# Patient Record
Sex: Male | Born: 1960 | Race: White | Hispanic: No | Marital: Married | Smoking: Current every day smoker
Health system: Southern US, Community
[De-identification: ages and names within clinical notes are randomized; demographics above are authoritative.]

## PROBLEM LIST (undated history)

## (undated) DIAGNOSIS — I499 Cardiac arrhythmia, unspecified: Secondary | ICD-10-CM

## (undated) DIAGNOSIS — M179 Osteoarthritis of knee, unspecified: Secondary | ICD-10-CM

## (undated) DIAGNOSIS — I4819 Other persistent atrial fibrillation: Secondary | ICD-10-CM

## (undated) DIAGNOSIS — I739 Peripheral vascular disease, unspecified: Secondary | ICD-10-CM

## (undated) DIAGNOSIS — Z7901 Long term (current) use of anticoagulants: Secondary | ICD-10-CM

## (undated) DIAGNOSIS — E785 Hyperlipidemia, unspecified: Secondary | ICD-10-CM

## (undated) DIAGNOSIS — I4891 Unspecified atrial fibrillation: Secondary | ICD-10-CM

## (undated) DIAGNOSIS — K562 Volvulus: Secondary | ICD-10-CM

## (undated) DIAGNOSIS — K645 Perianal venous thrombosis: Secondary | ICD-10-CM

## (undated) DIAGNOSIS — I70219 Atherosclerosis of native arteries of extremities with intermittent claudication, unspecified extremity: Secondary | ICD-10-CM

## (undated) DIAGNOSIS — E871 Hypo-osmolality and hyponatremia: Secondary | ICD-10-CM

## (undated) DIAGNOSIS — D171 Benign lipomatous neoplasm of skin and subcutaneous tissue of trunk: Secondary | ICD-10-CM

## (undated) DIAGNOSIS — I8393 Asymptomatic varicose veins of bilateral lower extremities: Secondary | ICD-10-CM

## (undated) DIAGNOSIS — F1010 Alcohol abuse, uncomplicated: Secondary | ICD-10-CM

## (undated) DIAGNOSIS — I5189 Other ill-defined heart diseases: Secondary | ICD-10-CM

## (undated) DIAGNOSIS — Z720 Tobacco use: Secondary | ICD-10-CM

## (undated) DIAGNOSIS — K648 Other hemorrhoids: Secondary | ICD-10-CM

## (undated) DIAGNOSIS — I700 Atherosclerosis of aorta: Secondary | ICD-10-CM

## (undated) DIAGNOSIS — M1990 Unspecified osteoarthritis, unspecified site: Secondary | ICD-10-CM

## (undated) DIAGNOSIS — I10 Essential (primary) hypertension: Secondary | ICD-10-CM

## (undated) HISTORY — DX: Asymptomatic varicose veins of bilateral lower extremities: I83.93

## (undated) HISTORY — DX: Essential (primary) hypertension: I10

## (undated) HISTORY — PX: JOINT REPLACEMENT: SHX530

---

## 2001-05-10 HISTORY — PX: BACK SURGERY: SHX140

## 2001-08-16 ENCOUNTER — Encounter: Payer: Self-pay | Admitting: Neurosurgery

## 2001-08-18 ENCOUNTER — Ambulatory Visit (HOSPITAL_COMMUNITY): Admission: RE | Admit: 2001-08-18 | Discharge: 2001-08-18 | Payer: Self-pay | Admitting: Neurosurgery

## 2001-08-18 ENCOUNTER — Encounter: Payer: Self-pay | Admitting: Neurosurgery

## 2006-02-04 DIAGNOSIS — I10 Essential (primary) hypertension: Secondary | ICD-10-CM | POA: Insufficient documentation

## 2007-05-16 DIAGNOSIS — F409 Phobic anxiety disorder, unspecified: Secondary | ICD-10-CM | POA: Insufficient documentation

## 2009-09-11 DIAGNOSIS — R6882 Decreased libido: Secondary | ICD-10-CM | POA: Insufficient documentation

## 2009-09-11 DIAGNOSIS — R5381 Other malaise: Secondary | ICD-10-CM | POA: Insufficient documentation

## 2009-10-14 ENCOUNTER — Ambulatory Visit: Payer: Self-pay | Admitting: Family Medicine

## 2009-10-14 DIAGNOSIS — E871 Hypo-osmolality and hyponatremia: Secondary | ICD-10-CM | POA: Insufficient documentation

## 2009-10-14 DIAGNOSIS — E559 Vitamin D deficiency, unspecified: Secondary | ICD-10-CM | POA: Insufficient documentation

## 2010-07-31 ENCOUNTER — Ambulatory Visit: Payer: Self-pay | Admitting: Specialist

## 2010-08-11 ENCOUNTER — Ambulatory Visit: Payer: Self-pay | Admitting: Specialist

## 2010-08-18 ENCOUNTER — Ambulatory Visit: Payer: Self-pay | Admitting: Specialist

## 2014-04-25 ENCOUNTER — Ambulatory Visit: Payer: Self-pay | Admitting: Nephrology

## 2014-10-22 ENCOUNTER — Other Ambulatory Visit: Payer: Self-pay | Admitting: Family Medicine

## 2014-10-22 DIAGNOSIS — I10 Essential (primary) hypertension: Secondary | ICD-10-CM

## 2014-11-01 DIAGNOSIS — M179 Osteoarthritis of knee, unspecified: Secondary | ICD-10-CM | POA: Insufficient documentation

## 2014-11-01 DIAGNOSIS — M1710 Unilateral primary osteoarthritis, unspecified knee: Secondary | ICD-10-CM | POA: Insufficient documentation

## 2014-11-01 DIAGNOSIS — I83813 Varicose veins of bilateral lower extremities with pain: Secondary | ICD-10-CM | POA: Insufficient documentation

## 2014-11-01 DIAGNOSIS — N529 Male erectile dysfunction, unspecified: Secondary | ICD-10-CM | POA: Insufficient documentation

## 2014-11-04 ENCOUNTER — Encounter: Payer: Self-pay | Admitting: Family Medicine

## 2014-11-04 ENCOUNTER — Ambulatory Visit (INDEPENDENT_AMBULATORY_CARE_PROVIDER_SITE_OTHER): Payer: BLUE CROSS/BLUE SHIELD | Admitting: Family Medicine

## 2014-11-04 VITALS — BP 138/82 | HR 80 | Temp 97.6°F | Resp 16 | Wt 190.8 lb

## 2014-11-04 DIAGNOSIS — K645 Perianal venous thrombosis: Secondary | ICD-10-CM | POA: Diagnosis not present

## 2014-11-04 MED ORDER — HYDROCORTISONE ACETATE 25 MG RE SUPP: 25.0000 mg | Freq: Two times a day (BID) | RECTAL | Status: DC

## 2014-11-04 MED ORDER — HYDROCODONE-ACETAMINOPHEN 5-325 MG PO TABS: 1.0000 | ORAL_TABLET | Freq: Four times a day (QID) | ORAL | Status: DC | PRN

## 2014-11-04 NOTE — Progress Notes (Signed)
Subjective:    Patient ID: Henry Hill, male    DOB: [birth year], 54 y.o.   MRN: [id number]  HPI  This 54 year old male was placed on Nifedipine a month or more ago by his nephrologist due to hyponatremia from use of HCTZ. Was advised this could create a constipation issue but did not take a stool softener regularly. Hemorrhoids became swollen and very painful 3 days ago. Feels it occurred due to constipation. Has been trying Preparation-H and hot Epsom saltwater soak with minimal relief. Occasional bleeding.   Past Medical History  Diagnosis Date  . Essential hypertension   . Varicose veins of both lower extremities     Past Surgical History  Procedure Laterality Date  . Back surgery  2003   History  Substance Use Topics  . Smoking status: Current Every Day Smoker -- 0.75 packs/day    Types: Cigarettes  . Smokeless tobacco: Not on file  . Alcohol Use: 0.0 oz/week    0 Standard drinks or equivalent per week     Comment: MODERATE USE   Family History  Problem Relation Age of Onset  . Heart attack Mother   . Diabetes Father   . Hypertension Father    Current Outpatient Prescriptions  Medication Sig Dispense Refill  . benazepril (LOTENSIN) 40 MG tablet TAKE 1 TABLET EVERY DAY 30 tablet 6  . cholecalciferol (VITAMIN D) 1000 UNITS tablet Take 1 tablet by mouth daily.    . meloxicam (MOBIC) 15 MG tablet Take 1 tablet by mouth daily.    Marland Kitchen NIFEdipine (PROCARDIA XL/ADALAT-CC) 60 MG 24 hr tablet   2   No current facility-administered medications for this visit.   No Known Allergies  Review of Systems  Constitutional: Negative.   Respiratory: Negative.   Cardiovascular: Negative.   Gastrointestinal: Positive for constipation, anal bleeding and rectal pain. Negative for nausea, vomiting and abdominal pain.      BP 138/82 mmHg  Pulse 80  Temp(Src) 97.6 F (36.4 C) (Oral)  Resp 16  Wt 190 lb 12.8 oz (86.546 kg)  Objective:   Physical Exam  Constitutional: He is oriented  to person, place, and time. He appears well-developed and well-nourished. No distress.  HENT:  Head: Normocephalic and atraumatic.  Right Ear: Hearing normal.  Left Ear: Hearing normal.  Nose: Nose normal.  Eyes: Conjunctivae and lids are normal. Right eye exhibits no discharge. Left eye exhibits no discharge. No scleral icterus.  Pulmonary/Chest: Effort normal. No respiratory distress.  Genitourinary: Rectal exam shows external hemorrhoid.     Musculoskeletal: Normal range of motion.  Neurological: He is alert and oriented to person, place, and time.  Skin: Skin is intact. No lesion and no rash noted.  Psychiatric: He has a normal mood and affect. His speech is normal and behavior is normal. Thought content normal.      Assessment & Plan:  1. Thrombosed external hemorrhoids Onset 3 days ago. Very large cluster around anus. Anesthetized with 1% Xylocaine with Epinephrine 1.5 cc and lanced in three large external hemorrhoids. Small clots expressed. Given analgesic and suppositories. He will soak twice a day and get back on stool softener daily. Increase fluid intake and out of work until follow up in 3-4 days. May need surgical referral pending progress. - HYDROcodone-acetaminophen (NORCO/VICODIN) 5-325 MG per tablet; Take 1 tablet by mouth every 6 (six) hours as needed for moderate pain.  Dispense: 30 tablet; Refill: 0 - hydrocortisone (ANUSOL-HC) 25 MG suppository; Place 1 suppository (25  mg total) rectally 2 (two) times daily.  Dispense: 12 suppository; Refill: 0

## 2014-11-04 NOTE — Patient Instructions (Signed)

## 2014-11-07 ENCOUNTER — Encounter: Payer: Self-pay | Admitting: Family Medicine

## 2014-11-07 ENCOUNTER — Ambulatory Visit (INDEPENDENT_AMBULATORY_CARE_PROVIDER_SITE_OTHER): Payer: BLUE CROSS/BLUE SHIELD | Admitting: Family Medicine

## 2014-11-07 VITALS — BP 126/84 | HR 76 | Temp 97.9°F | Resp 16 | Wt 192.6 lb

## 2014-11-07 DIAGNOSIS — K645 Perianal venous thrombosis: Secondary | ICD-10-CM

## 2014-11-07 NOTE — Progress Notes (Signed)
Subjective:    Patient ID: Henry Hill, male    DOB: January 16, 1961, 54 y.o.   MRN: [id number]  HPI Follow up of large thrombosed external hemorrhoids still very painful without improvement from lancing and Hydrocortisone suppositories with hot soaks 3 days ago. Large area on the left is very raw but no significant bleeding now. Still taking the stool softeners.  Patient Active Problem List   Diagnosis Date Noted  . Arthropathy, lower leg 11/01/2014  . Failure of erection 11/01/2014  . 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 Hypo-osmolality and hyponatremia 10/14/2009  . Avitaminosis D 10/14/2009  . Decreased libido 09/11/2009  . Malaise and fatigue 09/11/2009  . Phobia 05/16/2007  . Essential (primary) hypertension 02/04/2006   History  Substance Use Topics  . Smoking status: Current Every Day Smoker -- 0.75 packs/day    Types: Cigarettes  . Smokeless tobacco: Not on file  . Alcohol Use: 0.0 oz/week    0 Standard drinks or equivalent per week     Comment: MODERATE USE   Past Surgical History  Procedure Laterality Date  . Back surgery  2003   Family History  Problem Relation Age of Onset  . Heart attack Mother   . Diabetes Father   . Hypertension Father    Current Outpatient Prescriptions on File Prior to Visit  Medication Sig Dispense Refill  . benazepril (LOTENSIN) 40 MG tablet TAKE 1 TABLET EVERY DAY 30 tablet 6  . cholecalciferol (VITAMIN D) 1000 UNITS tablet Take 1 tablet by mouth daily.    Marland Kitchen HYDROcodone-acetaminophen (NORCO/VICODIN) 5-325 MG per tablet Take 1 tablet by mouth every 6 (six) hours as needed for moderate pain. 30 tablet 0  . hydrocortisone (ANUSOL-HC) 25 MG suppository Place 1 suppository (25 mg total) rectally 2 (two) times daily. 12 suppository 0  . meloxicam (MOBIC) 15 MG tablet Take 1 tablet by mouth daily.    Marland Kitchen NIFEdipine (PROCARDIA XL/ADALAT-CC) 60 MG 24 hr tablet   2   No current facility-administered medications on file prior to visit.   No Known  Allergies   Review of Systems  Constitutional: Negative.   Respiratory: Negative.   Cardiovascular: Negative.   Gastrointestinal: Positive for rectal pain.      BP 126/84 mmHg  Pulse 76  Temp(Src) 97.9 F (36.6 C) (Oral)  Resp 16  Wt 192 lb 9.6 oz (87.363 kg)  Objective:   Physical Exam  Constitutional: He is oriented to person, place, and time. He appears well-developed and well-nourished. No distress.  HENT:  Head: Normocephalic and atraumatic.  Right Ear: Hearing normal.  Left Ear: Hearing normal.  Nose: Nose normal.  Eyes: Conjunctivae and lids are normal. Right eye exhibits no discharge. Left eye exhibits no discharge. No scleral icterus.  Pulmonary/Chest: Effort normal. No respiratory distress.  Abdominal: Soft.  Genitourinary: Rectal exam shows external hemorrhoid.     Musculoskeletal: Normal range of motion.  Neurological: He is alert and oriented to person, place, and time.  Skin: Skin is intact. No lesion and no rash noted.  Psychiatric: He has a normal mood and affect. His speech is normal and behavior is normal. Thought content normal.      BP 126/84 mmHg  Pulse 76  Temp(Src) 97.9 F (36.6 C) (Oral)  Resp 16  Wt 192 lb 9.6 oz (87.363 kg)  Assessment & Plan:  1. Thrombosed external hemorrhoids Still very painful with very little drainage despite lancing 3 days ago. Recommend surgical evaluation today or tomorrow. - Ambulatory referral to  General Surgery

## 2014-11-08 ENCOUNTER — Ambulatory Visit: Payer: BLUE CROSS/BLUE SHIELD | Admitting: Family Medicine

## 2014-11-08 ENCOUNTER — Ambulatory Visit (INDEPENDENT_AMBULATORY_CARE_PROVIDER_SITE_OTHER): Payer: BLUE CROSS/BLUE SHIELD | Admitting: Surgery

## 2014-11-08 ENCOUNTER — Encounter: Payer: Self-pay | Admitting: Surgery

## 2014-11-08 VITALS — BP 128/90 | HR 92 | Temp 97.8°F | Ht 72.0 in | Wt 191.0 lb

## 2014-11-08 DIAGNOSIS — K6289 Other specified diseases of anus and rectum: Secondary | ICD-10-CM | POA: Insufficient documentation

## 2014-11-08 DIAGNOSIS — K648 Other hemorrhoids: Secondary | ICD-10-CM | POA: Diagnosis not present

## 2014-11-08 MED ORDER — PRAMOXINE HCL 1 % RE FOAM: 1.0000 "application " | RECTAL | Status: DC | PRN

## 2014-11-08 MED ORDER — LIDOCAINE HCL 4 % EX CREA
1.0000 | TOPICAL_CREAM | Freq: Three times a day (TID) | CUTANEOUS | Status: DC | PRN
Start: 2014-11-08 — End: 2014-11-08

## 2014-11-08 MED ORDER — HYDROCORTISONE 2.5 % RE CREA: 1.0000 "application " | TOPICAL_CREAM | Freq: Two times a day (BID) | RECTAL | Status: DC

## 2014-11-08 MED ORDER — OXYCODONE-ACETAMINOPHEN 5-325 MG PO TABS: 1.0000 | ORAL_TABLET | ORAL | Status: DC | PRN

## 2014-11-08 MED ORDER — LIDOCAINE HCL 4 % EX CREA: 1.0000 "application " | TOPICAL_CREAM | Freq: Three times a day (TID) | CUTANEOUS | Status: DC | PRN

## 2014-11-08 MED ORDER — HYDROCORTISONE ACETATE 25 MG RE SUPP: 25.0000 mg | Freq: Two times a day (BID) | RECTAL | Status: DC

## 2014-11-08 NOTE — Progress Notes (Signed)
CC: Severe anal pain x 1 week  54 yo M with history of reducible hemorrhoids who presents with 1 week of anal pain.  Began acutely.  Went to PCP who dx with thrombosed hemorrhoids who lanced them.  Min improvement.  Better with stool softener.  No fevers/chills, night sweats, shortness of breath, chest pain, nausea/vomiting, diarrhea/constipation, dysuria/hematuria.  Active Ambulatory Problems    Diagnosis Date Noted  . Arthropathy, lower leg 11/01/2014  . Decreased libido 09/11/2009  . Failure of erection 11/01/2014  . Essential (primary) hypertension 02/04/2006  . Hypo-osmolality and hyponatremia 10/14/2009  . Malaise and fatigue 09/11/2009  . Phobia 05/16/2007  . Avitaminosis D 10/14/2009  . 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 Anal pain 11/08/2014   Resolved Ambulatory Problems    Diagnosis Date Noted  . No Resolved Ambulatory Problems   Past Medical History  Diagnosis Date  . Essential hypertension   . Varicose veins of both lower extremities    Meds: Anusol suppositories, colace  No Known Allergies   History   Social History  . Marital Status: Married    Spouse Name: N/A  . Number of Children: N/A  . Years of Education: N/A   Occupational History  . Not on file.   Social History Main Topics  . Smoking status: Current Every Day Smoker -- 0.75 packs/day    Types: Cigarettes  . Smokeless tobacco: Not on file  . Alcohol Use: 0.0 oz/week    0 Standard drinks or equivalent per week     Comment: MODERATE USE  . Drug Use: No  . Sexual Activity: Not on file   Other Topics Concern  . Not on file   Social History Narrative   Family History  Problem Relation Age of Onset  . Heart attack Mother   . Diabetes Father   . Hypertension Father    ROS: Full ROS obtained, pertinent positives and negatives as above  Blood pressure 128/90, pulse 92, temperature 97.8 F (36.6 C), temperature source Oral, height 6' (1.829 m), weight 191 lb (86.637 kg).  GEN: NAD/A&Ox3 FACE: no  obvious facial trauma, normal external nose, normal external ears EYES: no scleral icterus, no conjunctivitis HEAD: normocephalic atraumatic CV: RRR, no MRG RESP: moving air well, lungs clear ABD: soft, nontender, nondistended ANUS: Prolapsed, nonreducible internal/external right hemorrhoids, with some epidermolysis EXT: moving all ext well, strength 5/5 NEURO: cnII-XII grossly intact, sensation intact all 4 ext  Labs: No new labs Imaging: no new imaging  A/P 54 yo M who presents with prolapsed, nonreducible incarcerated right sided internal/external hemorrhiods.  Do not feel that this will resolve on own and have recommended admission and hemorrhoidectomy.  He has many personal issues and would like to have them addressed next week.  I have given him anusol, proctofoam, topical lidocaine and percocet to attempt to get him through the weekend.  He is to call if his pain worsens, fevers, etc.  Will call on Tuesday to address with Dr. Pat Patrick possible plan for hemorroidectomy

## 2014-11-08 NOTE — Patient Instructions (Signed)
Our office will call you early morning on Tuesday. However, if you feel worse please go to the ED.

## 2014-11-13 ENCOUNTER — Ambulatory Visit (HOSPITAL_COMMUNITY): Payer: BLUE CROSS/BLUE SHIELD | Admitting: Anesthesiology

## 2014-11-13 ENCOUNTER — Encounter (HOSPITAL_COMMUNITY)
Admission: RE | Disposition: A | Discharge status: Home / Self Care | Payer: Self-pay | Source: Ambulatory Visit | Attending: General Surgery

## 2014-11-13 ENCOUNTER — Ambulatory Visit (HOSPITAL_COMMUNITY)
Admission: RE | Admit: 2014-11-13 | Discharge: 2014-11-13 | Disposition: A | Discharge status: Home / Self Care | Payer: BLUE CROSS/BLUE SHIELD | Source: Ambulatory Visit | Attending: General Surgery | Admitting: General Surgery

## 2014-11-13 ENCOUNTER — Encounter (HOSPITAL_COMMUNITY): Payer: Self-pay | Admitting: *Deleted

## 2014-11-13 DIAGNOSIS — Z79899 Other long term (current) drug therapy: Secondary | ICD-10-CM | POA: Insufficient documentation

## 2014-11-13 DIAGNOSIS — I10 Essential (primary) hypertension: Secondary | ICD-10-CM | POA: Diagnosis not present

## 2014-11-13 DIAGNOSIS — Z7952 Long term (current) use of systemic steroids: Secondary | ICD-10-CM | POA: Diagnosis not present

## 2014-11-13 DIAGNOSIS — F17200 Nicotine dependence, unspecified, uncomplicated: Secondary | ICD-10-CM | POA: Diagnosis not present

## 2014-11-13 DIAGNOSIS — K648 Other hemorrhoids: Secondary | ICD-10-CM

## 2014-11-13 DIAGNOSIS — K645 Perianal venous thrombosis: Secondary | ICD-10-CM

## 2014-11-13 DIAGNOSIS — Z791 Long term (current) use of non-steroidal anti-inflammatories (NSAID): Secondary | ICD-10-CM | POA: Diagnosis not present

## 2014-11-13 HISTORY — PX: HEMORRHOID SURGERY: SHX153

## 2014-11-13 LAB — CBC
HCT: 39.4 % (ref 39.0–52.0)
Hemoglobin: 13.5 g/dL (ref 13.0–17.0)
MCH: 33 pg (ref 26.0–34.0)
MCHC: 34.3 g/dL (ref 30.0–36.0)
MCV: 96.3 fL (ref 78.0–100.0)
Platelets: 325 10*3/uL (ref 150–400)
RBC: 4.09 MIL/uL — ABNORMAL LOW (ref 4.22–5.81)
RDW: 13.2 % (ref 11.5–15.5)
WBC: 7.6 10*3/uL (ref 4.0–10.5)

## 2014-11-13 LAB — BASIC METABOLIC PANEL
Anion gap: 8 (ref 5–15)
BUN: 8 mg/dL (ref 6–20)
CO2: 23 mmol/L (ref 22–32)
Calcium: 9.3 mg/dL (ref 8.9–10.3)
Chloride: 102 mmol/L (ref 101–111)
Creatinine, Ser: 0.74 mg/dL (ref 0.61–1.24)
GFR calc Af Amer: >60 mL/min (ref >60)
GFR calc non Af Amer: >60 mL/min (ref >60)
Glucose, Bld: 102 mg/dL — ABNORMAL HIGH (ref 65–99)
Potassium: 4.2 mmol/L (ref 3.5–5.1)
Sodium: 133 mmol/L — ABNORMAL LOW (ref 135–145)

## 2014-11-13 SURGERY — HEMORRHOIDECTOMY
Anesthesia: General

## 2014-11-13 MED ORDER — DIBUCAINE 1 % RE OINT
TOPICAL_OINTMENT | RECTAL | Status: AC
  Filled 2014-11-13: qty 28

## 2014-11-13 MED ORDER — FENTANYL CITRATE (PF) 250 MCG/5ML IJ SOLN
INTRAMUSCULAR | Status: AC
  Filled 2014-11-13: qty 5

## 2014-11-13 MED ORDER — LACTATED RINGERS IV SOLN
INTRAVENOUS | Status: DC
  Administered 2014-11-13: 1000 mL via INTRAVENOUS

## 2014-11-13 MED ORDER — DEXAMETHASONE SODIUM PHOSPHATE 10 MG/ML IJ SOLN
INTRAMUSCULAR | Status: AC
  Filled 2014-11-13: qty 1

## 2014-11-13 MED ORDER — ONDANSETRON HCL 4 MG/2ML IJ SOLN
INTRAMUSCULAR | Status: AC
  Filled 2014-11-13: qty 2

## 2014-11-13 MED ORDER — FENTANYL CITRATE (PF) 250 MCG/5ML IJ SOLN
INTRAMUSCULAR | Status: DC | PRN
  Administered 2014-11-13 (×3): 25 ug via INTRAVENOUS
  Administered 2014-11-13: 50 ug via INTRAVENOUS

## 2014-11-13 MED ORDER — FENTANYL CITRATE (PF) 100 MCG/2ML IJ SOLN
INTRAMUSCULAR | Status: AC
Start: 2014-11-13 — End: 2014-11-13
  Filled 2014-11-13: qty 2

## 2014-11-13 MED ORDER — SODIUM CHLORIDE 0.9 % IJ SOLN
INTRAMUSCULAR | Status: DC | PRN
  Administered 2014-11-13: 12.5 mL

## 2014-11-13 MED ORDER — ONDANSETRON HCL 4 MG/2ML IJ SOLN
INTRAMUSCULAR | Status: AC
Start: 2014-11-13 — End: 2014-11-13
  Filled 2014-11-13: qty 2

## 2014-11-13 MED ORDER — MIDAZOLAM HCL 2 MG/2ML IJ SOLN
INTRAMUSCULAR | Status: AC
  Filled 2014-11-13: qty 2

## 2014-11-13 MED ORDER — MIDAZOLAM HCL 2 MG/2ML IJ SOLN
INTRAMUSCULAR | Status: AC
Start: 2014-11-13 — End: 2014-11-13
  Filled 2014-11-13: qty 2

## 2014-11-13 MED ORDER — LIDOCAINE HCL (CARDIAC) 20 MG/ML IV SOLN
INTRAVENOUS | Status: AC
Start: 2014-11-13 — End: 2014-11-13
  Filled 2014-11-13: qty 5

## 2014-11-13 MED ORDER — ROCURONIUM BROMIDE 100 MG/10ML IV SOLN
INTRAVENOUS | Status: AC
  Filled 2014-11-13: qty 1

## 2014-11-13 MED ORDER — DEXAMETHASONE SODIUM PHOSPHATE 10 MG/ML IJ SOLN
INTRAMUSCULAR | Status: DC | PRN
  Administered 2014-11-13: 10 mg via INTRAVENOUS

## 2014-11-13 MED ORDER — CEFOXITIN SODIUM 2 G IV SOLR
2.0000 g | INTRAVENOUS | Status: AC
  Administered 2014-11-13: 2 g via INTRAVENOUS

## 2014-11-13 MED ORDER — HYDROCODONE-ACETAMINOPHEN 5-325 MG PO TABS: 1.0000 | ORAL_TABLET | Freq: Four times a day (QID) | ORAL | Status: DC | PRN

## 2014-11-13 MED ORDER — ONDANSETRON HCL 4 MG/2ML IJ SOLN
INTRAMUSCULAR | Status: DC | PRN
  Administered 2014-11-13: 4 mg via INTRAVENOUS

## 2014-11-13 MED ORDER — SODIUM CHLORIDE 0.9 % IJ SOLN
INTRAMUSCULAR | Status: AC
  Filled 2014-11-13: qty 20

## 2014-11-13 MED ORDER — HYDROMORPHONE HCL 1 MG/ML IJ SOLN: 0.2500 mg | INTRAMUSCULAR | Status: DC | PRN

## 2014-11-13 MED ORDER — PROPOFOL 10 MG/ML IV BOLUS
INTRAVENOUS | Status: AC
  Filled 2014-11-13: qty 20

## 2014-11-13 MED ORDER — MIDAZOLAM HCL 5 MG/5ML IJ SOLN
INTRAMUSCULAR | Status: DC | PRN
  Administered 2014-11-13: 2 mg via INTRAVENOUS

## 2014-11-13 MED ORDER — FENTANYL CITRATE (PF) 100 MCG/2ML IJ SOLN
INTRAMUSCULAR | Status: AC
  Filled 2014-11-13: qty 2

## 2014-11-13 MED ORDER — DEXTROSE 5 % IV SOLN
INTRAVENOUS | Status: AC
  Filled 2014-11-13: qty 2

## 2014-11-13 MED ORDER — PROPOFOL 10 MG/ML IV BOLUS
INTRAVENOUS | Status: DC | PRN
  Administered 2014-11-13: 20 mg via INTRAVENOUS
  Administered 2014-11-13: 180 mg via INTRAVENOUS

## 2014-11-13 MED ORDER — BUPIVACAINE LIPOSOME 1.3 % IJ SUSP
20.0000 mL | Freq: Once | INTRAMUSCULAR | Status: AC
  Administered 2014-11-13: 20 mL
  Filled 2014-11-13: qty 20

## 2014-11-13 SURGICAL SUPPLY — 35 items
BLADE HEX COATED 2.75 (ELECTRODE) ×2 IMPLANT
BLADE SURG 15 STRL LF DISP TIS (BLADE) ×1 IMPLANT
BLADE SURG 15 STRL SS (BLADE) ×2
BRIEF STRETCH FOR OB PAD LRG (UNDERPADS AND DIAPERS) IMPLANT
COVER MAYO STAND STRL (DRAPES) IMPLANT
COVER SURGICAL LIGHT HANDLE (MISCELLANEOUS) ×1 IMPLANT
DECANTER SPIKE VIAL GLASS SM (MISCELLANEOUS) ×2 IMPLANT
DRAPE SHEET LG 3/4 BI-LAMINATE (DRAPES) ×2 IMPLANT
DRSG PAD ABDOMINAL 8X10 ST (GAUZE/BANDAGES/DRESSINGS) ×1 IMPLANT
ELECT REM PT RETURN 9FT ADLT (ELECTROSURGICAL) ×2
ELECTRODE REM PT RTRN 9FT ADLT (ELECTROSURGICAL) ×1 IMPLANT
GAUZE SPONGE 4X4 12PLY STRL (GAUZE/BANDAGES/DRESSINGS) ×1 IMPLANT
GAUZE SPONGE 4X4 16PLY XRAY LF (GAUZE/BANDAGES/DRESSINGS) ×2 IMPLANT
GLOVE BIOGEL PI IND STRL 7.0 (GLOVE) ×1 IMPLANT
GLOVE BIOGEL PI INDICATOR 7.0 (GLOVE) ×1
GLOVE EUDERMIC 7 POWDERFREE (GLOVE) ×2 IMPLANT
GOWN STRL REUS W/TWL LRG LVL3 (GOWN DISPOSABLE) ×2 IMPLANT
GOWN STRL REUS W/TWL XL LVL3 (GOWN DISPOSABLE) ×4 IMPLANT
HEMOSTAT SNOW SURGICEL 2X4 (HEMOSTASIS) IMPLANT
KIT BASIN OR (CUSTOM PROCEDURE TRAY) ×2 IMPLANT
LUBRICANT JELLY K Y 4OZ (MISCELLANEOUS) ×2 IMPLANT
NDL HYPO 25X1 1.5 SAFETY (NEEDLE) ×1 IMPLANT
NDL SAFETY ECLIPSE 18X1.5 (NEEDLE) IMPLANT
NEEDLE HYPO 18GX1.5 SHARP (NEEDLE)
NEEDLE HYPO 25X1 1.5 SAFETY (NEEDLE) ×2 IMPLANT
NS IRRIG 1000ML POUR BTL (IV SOLUTION) ×2 IMPLANT
PACK LITHOTOMY IV (CUSTOM PROCEDURE TRAY) ×2 IMPLANT
PENCIL BUTTON HOLSTER BLD 10FT (ELECTRODE) ×2 IMPLANT
SPONGE SURGIFOAM ABS GEL 100 (HEMOSTASIS) ×3 IMPLANT
SUT CHROMIC 2 0 SH (SUTURE) ×2 IMPLANT
SUT CHROMIC 3 0 SH 27 (SUTURE) IMPLANT
SUT VIC AB 2-0 SH 18 (SUTURE) ×2 IMPLANT
SYR CONTROL 10ML LL (SYRINGE) ×2 IMPLANT
TOWEL OR 17X26 10 PK STRL BLUE (TOWEL DISPOSABLE) ×2 IMPLANT
YANKAUER SUCT BULB TIP 10FT TU (MISCELLANEOUS) ×2 IMPLANT

## 2014-11-13 NOTE — Anesthesia Postprocedure Evaluation (Signed)
  Anesthesia Post-op Note  Patient: Henry Hill  Procedure(s) Performed: Procedure(s): LEFT LATERAL HEMORRHOIDECTOMY   (N/A)  Patient Location: PACU  Anesthesia Type:General  Level of Consciousness: awake and alert   Airway and Oxygen Therapy: Patient Spontanous Breathing  Post-op Pain: Controlled  Post-op Assessment: Post-op Vital signs reviewed, Patient's Cardiovascular Status Stable and Respiratory Function Stable  Post-op Vital Signs: Reviewed  Filed Vitals:   11/13/14 1815  BP: 146/99  Pulse: 77  Temp:   Resp: 15    Complications: No apparent anesthesia complications

## 2014-11-13 NOTE — Op Note (Signed)
  Patient Name:           Henry Hill   Date of Surgery:        11/13/2014  Pre op Diagnosis:      Thrombosed internal and external hemorrhoids, left lateral  Post op Diagnosis:    Same  Procedure:                Anoscopy,, Internal and external hemorrhoidectomy, left lateral, single column  Surgeon:                     Edsel Petrin. Dalbert Batman, M.D., FACS  Assistant:                      Or staff  Operative Indications:   Patient is a 54 year old male whose previously had some intermittent hemorrhoid symptoms treated with over-the-counter medications. 12 days ago he developed painful swelling of the left side of his anus. He was seen a little over week ago in the family practice office and had an attempt at [facility] a thrombosed hemorrhoid. This did not produce any improvement. He has ongoing pain and swelling that is fairly severe and is referred for evaluation. He's not having previous hemorrhoid surgery.  On exam he has significant numbers to external hemorrhoids and smaller thrombosed internal hemorrhoids, and this seems to be limited to the left lateral area.  There is no evidence of cellulitis or abscess.  These were felt to be too bulky to do under local anesthesia in the office and he is brought to the operating room urgently.  Operative Findings:       He had very large thrombosed external hemorrhoids externally and medium-sized thrombosed internal hemorrhoids.  Anoscopy revealed normal rectal mucosa otherwise.  Procedure in Detail:          Following the induction of general endotracheal anesthesia the patient was placed in a modified dorsal lithotomy position in rigid padded stirrups.  The genitalia and perianal area was  prepped and draped in sterile fashion.  Surgical timeout was performed.  A 50% solution of Exparel was injected into the superficial tissues and the deep sphincters.  Gentle dilatation was performed.  Anoscope was inserted with findings as described above.    The  anoscope was position so as to expose the left lateral position.  A figure-of-eight suture of 2-0 chromic was placed in the rectal mucosa above the hemorrhoidal pile.  Using electrocautery I excised the internal/external hemorrhoids in a standard fashion.  With good traction and countertraction I was able to avoid injury to the sphincter mechanisms.  Hemostasis was excellent and achieved with electrocautery.  The rectal mucosa, dentate line, and anoderm were closed  with a running 2-0 chromic suture.  Hemostasis was excellent.  I reinserted the endoscope without any difficulty.  No stenosis.  Gelfoam coated with dibucaine ointment was inserted into the anal canal.  External dry bandages and fishnet panties were placed.  The patient tolerated the procedure well was taken to PACU in stable condition.  EBL 15 mL.  Counts correct.  Complications none.   Edsel Petrin. Dalbert Batman, M.D., FACS General and Minimally Invasive Surgery Breast and Colorectal Surgery  11/13/2014 5:44 PM

## 2014-11-13 NOTE — Interval H&P Note (Signed)
History and Physical Interval Note:  11/13/2014 4:25 PM  Henry Hill  has presented today for surgery, with the diagnosis of Hemorrhoids  The various methods of treatment have been discussed with the patient and family.      I have discussed the indications, details, techniques, and numerous risk of the surgery with him.  He is aware the risk of bleeding, infection, recurrent hemorrhoids, sphincter stenosis, sphincter damage with poor control, and other unforeseen problems.  He is aware there were going to be fairly conservative and treat only the symptomatic hemorrhoids.  All his questions are answered.  He understands all these issues.  He agrees with this plan.         After consideration of risks, benefits and other options for treatment, the patient has consented to  Procedure(s): HEMORRHOIDECTOMY (N/A) as a surgical intervention .  The patient's history has been reviewed, patient examined, no change in status, stable for surgery.  I have reviewed the patient's chart and labs.  Questions were answered to the patient's satisfaction.     Adin Hector

## 2014-11-13 NOTE — Transfer of Care (Signed)
Immediate Anesthesia Transfer of Care Note  Patient: Henry Hill  Procedure(s) Performed: Procedure(s): LEFT LATERAL HEMORRHOIDECTOMY   (N/A)  Patient Location: PACU  Anesthesia Type:General  Level of Consciousness: awake, alert , oriented and patient cooperative  Airway & Oxygen Therapy: Patient Spontanous Breathing and Patient connected to face mask oxygen  Post-op Assessment: Report given to RN and Post -op Vital signs reviewed and stable  Post vital signs: Reviewed and stable  Last Vitals:  Filed Vitals:   11/13/14 1052  BP: 144/98  Pulse: 101  Temp: 36.9 C  Resp: 18    Complications: No apparent anesthesia complications

## 2014-11-13 NOTE — H&P (Signed)
Henry Hill  Location: [facility] Patient #: [id number] DOB: 1961-02-23 Married / Language: English / Race: White Male       History of Present Illness  Patient words: Evaluate thrombosed hemorrhoids.  The patient is a 54 year old male who presents with hemorrhoids. Patient is a 54 year old male whose previously had some intermittent hemorrhoid symptoms treated with over-the-counter medications. 12 days ago he developed painful swelling of the left side of his anus. He was seen a little over week ago in the family practice office and had an attempt at [facility] thrombosed hemorrhoid. This did not produce any improvement. He has ongoing pain and swelling that is fairly severe and is referred for evaluation. He's not having previous hemorrhoid surgery.   Other Problems  Hemorrhoids High blood pressure  Past Surgical History  Knee Surgery Left. Spinal Surgery Midback  Diagnostic Studies History  Colonoscopy never  Medication History  Hydrocodone-Acetaminophen (5-325MG  Tablet, Oral) Active. Benazepril HCl (40MG  Tablet, Oral) Active. Anusol-HC (25MG  Suppository, Rectal) Active. Meloxicam (15MG  Tablet, Oral) Active. NIFEdipine ER Osmotic Release (60MG  Tablet ER 24HR, Oral) Active. Proctozone-HC (2.5% Cream, Rectal) Active.  Social History  Alcohol use Moderate alcohol use. No caffeine use No drug use Tobacco use Current every day smoker.  Family History Henry Hill, [place]; 11/12/2014 4:23 PM) Cerebrovascular Accident Mother. Diabetes Mellitus Mother. Heart Disease Mother. Heart disease in male family member before age 23 Hypertension Father, Mother.  Review of Systems Henry Hill CMA; 11/12/2014 4:23 PM) General Not Present- Appetite Loss, Chills, Fatigue, Fever, Night Sweats, Weight Gain and Weight Loss. Skin Not Present- Change in Wart/Mole, Dryness, Hives, Jaundice, New Lesions, Non-Healing Wounds, Rash and Ulcer. HEENT Present-  Wears glasses/contact lenses. Not Present- Earache, Hearing Loss, Hoarseness, Nose Bleed, Oral Ulcers, Ringing in the Ears, Seasonal Allergies, Sinus Pain, Sore Throat, Visual Disturbances and Yellow Eyes. Respiratory Not Present- Bloody sputum, Chronic Cough, Difficulty Breathing, Snoring and Wheezing. Breast Not Present- Breast Mass, Breast Pain, Nipple Discharge and Skin Changes. Cardiovascular Not Present- Chest Pain, Difficulty Breathing Lying Down, Leg Cramps, Palpitations, Rapid Heart Rate, Shortness of Breath and Swelling of Extremities. Gastrointestinal Present- Rectal Pain. Not Present- Abdominal Pain, Bloating, Bloody Stool, Change in Bowel Habits, Chronic diarrhea, Constipation, Difficulty Swallowing, Excessive gas, Gets full quickly at meals, Hemorrhoids, Indigestion, Nausea and Vomiting. Male Genitourinary Not Present- Blood in Urine, Change in Urinary Stream, Frequency, Impotence, Nocturia, Painful Urination, Urgency and Urine Leakage. Musculoskeletal Not Present- Back Pain, Joint Pain, Joint Stiffness, Muscle Pain, Muscle Weakness and Swelling of Extremities. Neurological Not Present- Decreased Memory, Fainting, Headaches, Numbness, Seizures, Tingling, Tremor, Trouble walking and Weakness. Psychiatric Not Present- Anxiety, Bipolar, Change in Sleep Pattern, Depression, Fearful and Frequent crying. Endocrine Not Present- Cold Intolerance, Excessive Hunger, Hair Changes, Heat Intolerance, Hot flashes and New Diabetes. Hematology Not Present- Easy Bruising, Excessive bleeding, Gland problems, HIV and Persistent Infections.   Physical Exam  The physical exam findings are as follows: Note:General: Well-developed Caucasian male stress Skin: Warm and dry Lungs: Clear breath sounds bilaterally Cardiac: Regular rate and rhythm. No edema Abdomen: Soft and nontender Rectal: There is a large area of thrombosed prolapsed combination hemorrhoids in the left lateral anus that is very tender and  nonreducible encompassing about a third to one half of the anal circumference. Neurologic: Alert and fully oriented and affect normal    Assessment & Plan  HEMORRHOIDS, THROMBOSED (455.7  K64.5) Impression: Severe thrombosed partially necrotic combination hemorrhoids. He will need formal hemorrhoidectomy under general anesthesia.  Discussed this  with the patient including the nature of the surgery indications. We will have him nothing by mouth tonight and I discussed with Dr. Dalbert Hill, our urgent service physician, regarding proceeding with hemorrhoidectomy tomorrow. Current Plans  Schedule for Surgery Hemorrhoidectomy under general anesthesia tomorrow as an outpatient.   Henry Hill. Henry Hill, M.D., [facility] General and Minimally invasive Surgery Breast and Colorectal Surgery Office:   [phone] Pager:   [phone]

## 2014-11-13 NOTE — Anesthesia Procedure Notes (Signed)
Procedure Name: LMA Insertion Date/Time: 11/13/2014 5:59 PM Performed by: Dione Booze Pre-anesthesia Checklist: Patient identified, Emergency Drugs available, Suction available and Patient being monitored Patient Re-evaluated:Patient Re-evaluated prior to inductionOxygen Delivery Method: Circle system utilized Preoxygenation: Pre-oxygenation with 100% oxygen Intubation Type: IV induction LMA: LMA inserted LMA Size: 4.0 Number of attempts: 1 Tube secured with: Tape Dental Injury: Teeth and Oropharynx as per pre-operative assessment

## 2014-11-13 NOTE — Discharge Instructions (Signed)
Avoid constipation. Take a stool softener twice a day starting tomorrow Take a laxity of such as Mira lax once or twice a day starting tomorrow  Take a warm tub bath 3 times a day.  Expect some drainage.  Wear a pad.  [facility] _______[facility]  RECTAL SURGERY POST OP INSTRUCTIONS: POST OP INSTRUCTIONS  Always review your discharge instruction sheet given to you by the facility where your surgery was performed. IF YOU HAVE DISABILITY OR FAMILY LEAVE FORMS, YOU MUST BRING THEM TO THE OFFICE FOR PROCESSING.   DO NOT GIVE THEM TO YOUR DOCTOR.  1. A  prescription for pain medication may be given to you upon discharge.  Take your pain medication as prescribed, if needed.  If narcotic pain medicine is not needed, then you may take acetaminophen (Tylenol) or ibuprofen (Advil) as needed. 2. Take your usually prescribed medications unless otherwise directed. 3. If you need a refill on your pain medication, please contact your pharmacy.  They will contact our office to request authorization. Prescriptions will not be filled after 5 pm or on week-ends. 4. You should follow a light diet the first 48 hours after arrival home, such as soup and crackers, etc.  Be sure to include lots of fluids daily.  Resume your normal diet 2-3 days after surgery.. 5. Most patients will experience some swelling and discomfort in the rectal area. Ice packs, reclining and warm tub soaks will help.  Swelling and discomfort can take several days to resolve.  6. It is common to experience some constipation if taking pain medication after surgery.  Increasing fluid intake and taking a stool softener (such as Colace) will usually help or prevent this problem from occurring.  A mild laxative (Milk of Magnesia or Miralax) should be taken according to package directions if there are no bowel movements after 48 hours. 7. Unless discharge instructions indicate otherwise, leave your bandage dry and in place for 24 hours, or  remove the bandage if you have a bowel movement. You may notice a small amount of bleeding with bowel movements for the first few days. You may have some packing in the rectum which will come out over the first day or two. You will need to wear an absorbent pad or soft cotton gauze in your underwear until the drainage stops.it. 8. ACTIVITIES:  You may resume regular (light) daily activities beginning the next day--such as daily self-care, walking, climbing stairs--gradually increasing activities as tolerated.  You may have sexual intercourse when it is comfortable.  Refrain from any heavy lifting or straining until approved by your doctor. a. You may drive when you are no longer taking prescription pain medication, you can comfortably wear a seatbelt, and you can safely maneuver your car and apply brakes. b. RETURN TO WORK: : ____________________ c.  9. You should see your doctor in the office for a follow-up appointment approximately 2-3 weeks after your surgery.  Make sure that you call for this appointment within a day or two after you arrive home to insure a convenient appointment time. 10. OTHER INSTRUCTIONS:  __________________________________________________________________________________________________________________________________________________________________________________________  WHEN TO CALL YOUR DOCTOR: 1. Fever over 101.0 2. Inability to urinate 3. Nausea and/or vomiting 4. Extreme swelling or bruising 5. Continued bleeding from rectum. 6. Increased pain, redness, or drainage from the incision 7. Constipation  The clinic staff is available to answer your questions during regular business hours.  Please dont hesitate to call and ask to speak to one of the nurses  for clinical concerns.  If you have a medical emergency, go to the nearest emergency room or call 911.  A surgeon from [facility] is always on call at the hospital   [address] [place],  [place], [place]  [address] ?  [address], [place], [place]   [address] [phone] ? [phone] ? FAX [phone] Web site: [email or web address] _______[facility]  RECTAL SURGERY POST OP INSTRUCTIONS: POST OP INSTRUCTIONS  Always review your discharge instruction sheet given to you by the facility where your surgery was performed. IF YOU HAVE DISABILITY OR FAMILY LEAVE FORMS, YOU MUST BRING THEM TO THE OFFICE FOR PROCESSING.   DO NOT GIVE THEM TO YOUR DOCTOR.  11. A  prescription for pain medication may be given to you upon discharge.  Take your pain medication as prescribed, if needed.  If narcotic pain medicine is not needed, then you may take acetaminophen (Tylenol) or ibuprofen (Advil) as needed. 12. Take your usually prescribed medications unless otherwise directed. 13. If you need a refill on your pain medication, please contact your pharmacy.  They will contact our office to request authorization. Prescriptions will not be filled after 5 pm or on week-ends. 14. You should follow a light diet the first 48 hours after arrival home, such as soup and crackers, etc.  Be sure to include lots of fluids daily.  Resume your normal diet 2-3 days after surgery.. 15. Most patients will experience some swelling and discomfort in the rectal area. Ice packs, reclining and warm tub soaks will help.  Swelling and discomfort can take several days to resolve.  16. It is common to experience some constipation if taking pain medication after surgery.  Increasing fluid intake and taking a stool softener (such as Colace) will usually help or prevent this problem from occurring.  A mild laxative (Milk of Magnesia or Miralax) should be taken according to package directions if there are no bowel movements after 48 hours. 17. Unless discharge instructions indicate otherwise, leave your bandage dry and in place for 24 hours, or remove the bandage if you have a bowel movement. You may  notice a small amount of bleeding with bowel movements for the first few days. You may have some packing in the rectum which will come out over the first day or two. You will need to wear an absorbent pad or soft cotton gauze in your underwear until the drainage stops.it. 18. ACTIVITIES:  You may resume regular (light) daily activities beginning the next day--such as daily self-care, walking, climbing stairs--gradually increasing activities as tolerated.  You may have sexual intercourse when it is comfortable.  Refrain from any heavy lifting or straining until approved by your doctor. a. You may drive when you are no longer taking prescription pain medication, you can comfortably wear a seatbelt, and you can safely maneuver your car and apply brakes. b. RETURN TO WORK: : ____________________ c.  19. You should see your doctor in the office for a follow-up appointment approximately 2-3 weeks after your surgery.  Make sure that you call for this appointment within a day or two after you arrive home to insure a convenient appointment time. 20. OTHER INSTRUCTIONS:  __________________________________________________________________________________________________________________________________________________________________________________________  WHEN TO CALL YOUR DOCTOR: 8. Fever over 101.0 9. Inability to urinate 10. Nausea and/or vomiting 11. Extreme swelling or bruising 12. Continued bleeding from rectum. 13. Increased pain, redness, or drainage from the incision 14. Constipation  The clinic staff is available to  answer your questions during regular business hours.  Please dont hesitate to call and ask to speak to one of the nurses for clinical concerns.  If you have a medical emergency, go to the nearest emergency room or call 911.  A surgeon from [facility] is always on call at the hospital   [address] [place], [place], [place]  [address] ?  [address],  [place], [place]   [address] [phone] ? [phone] ? FAX [phone] Web site: [email or web address]

## 2014-11-13 NOTE — Anesthesia Preprocedure Evaluation (Addendum)
Anesthesia Evaluation  Patient identified by MRN, date of birth, ID band Patient awake    Reviewed: Allergy & Precautions, H&P , NPO status , Patient's Chart, lab work & pertinent test results  Airway Mallampati: II  TM Distance: >3 FB Neck ROM: Full    Dental no notable dental hx. (+) Teeth Intact, Dental Advisory Given   Pulmonary Current Smoker,  breath sounds clear to auscultation  Pulmonary exam normal       Cardiovascular hypertension, Pt. on medications Rhythm:Regular Rate:Normal     Neuro/Psych negative neurological ROS  negative psych ROS   GI/Hepatic negative GI ROS, Neg liver ROS,   Endo/Other  negative endocrine ROS  Renal/GU negative Renal ROS  negative genitourinary   Musculoskeletal   Abdominal   Peds  Hematology negative hematology ROS (+)   Anesthesia Other Findings   Reproductive/Obstetrics negative OB ROS                            Anesthesia Physical Anesthesia Plan  ASA: II  Anesthesia Plan: General   Post-op Pain Management:    Induction: Intravenous  Airway Management Planned: LMA and Oral ETT  Additional Equipment:   Intra-op Plan:   Post-operative Plan: Extubation in OR  Informed Consent: I have reviewed the patients History and Physical, chart, labs and discussed the procedure including the risks, benefits and alternatives for the proposed anesthesia with the patient or authorized representative who has indicated his/her understanding and acceptance.   Dental advisory given  Plan Discussed with: CRNA  Anesthesia Plan Comments:         Anesthesia Quick Evaluation

## 2014-11-14 ENCOUNTER — Encounter (HOSPITAL_COMMUNITY): Payer: Self-pay | Admitting: General Surgery

## 2014-11-14 ENCOUNTER — Telehealth: Payer: Self-pay | Admitting: Surgery

## 2014-11-14 ENCOUNTER — Ambulatory Visit: Payer: BLUE CROSS/BLUE SHIELD | Admitting: Family Medicine

## 2014-11-14 NOTE — Telephone Encounter (Signed)
I have called pt to see if he would like to be seen by Dr Pat Patrick in the office to discuss sx further. Pt stated that he was not available to come in for appt at this time.

## 2015-04-21 ENCOUNTER — Other Ambulatory Visit: Payer: Self-pay | Admitting: Family Medicine

## 2015-04-21 DIAGNOSIS — I10 Essential (primary) hypertension: Secondary | ICD-10-CM

## 2015-04-21 MED ORDER — BENAZEPRIL HCL 40 MG PO TABS: ORAL_TABLET | ORAL | Status: DC

## 2015-09-18 ENCOUNTER — Other Ambulatory Visit: Payer: Self-pay

## 2015-09-18 MED ORDER — NIFEDIPINE ER OSMOTIC RELEASE 60 MG PO TB24: 60.0000 mg | ORAL_TABLET | Freq: Every day | ORAL | Status: DC

## 2015-09-18 NOTE — Telephone Encounter (Signed)
Refill request received from [facility] requesting Nifedipine ER 60 mg.

## 2015-10-14 ENCOUNTER — Ambulatory Visit (INDEPENDENT_AMBULATORY_CARE_PROVIDER_SITE_OTHER): Payer: BLUE CROSS/BLUE SHIELD | Admitting: Family Medicine

## 2015-10-14 ENCOUNTER — Encounter: Payer: Self-pay | Admitting: Family Medicine

## 2015-10-14 VITALS — BP 128/82 | HR 80 | Temp 98.7°F | Resp 16 | Ht 73.0 in | Wt 195.0 lb

## 2015-10-14 DIAGNOSIS — IMO0002 Reserved for concepts with insufficient information to code with codable children: Secondary | ICD-10-CM

## 2015-10-14 DIAGNOSIS — E871 Hypo-osmolality and hyponatremia: Secondary | ICD-10-CM | POA: Diagnosis not present

## 2015-10-14 DIAGNOSIS — I8390 Asymptomatic varicose veins of unspecified lower extremity: Secondary | ICD-10-CM

## 2015-10-14 DIAGNOSIS — I10 Essential (primary) hypertension: Secondary | ICD-10-CM | POA: Diagnosis not present

## 2015-10-14 DIAGNOSIS — I8393 Asymptomatic varicose veins of bilateral lower extremities: Secondary | ICD-10-CM

## 2015-10-14 DIAGNOSIS — M1710 Unilateral primary osteoarthritis, unspecified knee: Secondary | ICD-10-CM

## 2015-10-14 DIAGNOSIS — M129 Arthropathy, unspecified: Secondary | ICD-10-CM

## 2015-10-14 MED ORDER — MELOXICAM 15 MG PO TABS: 15.0000 mg | ORAL_TABLET | Freq: Every day | ORAL | Status: DC | PRN

## 2015-10-14 MED ORDER — NIFEDIPINE ER OSMOTIC RELEASE 60 MG PO TB24: 60.0000 mg | ORAL_TABLET | Freq: Every day | ORAL | Status: DC

## 2015-10-14 MED ORDER — BENAZEPRIL HCL 40 MG PO TABS: ORAL_TABLET | ORAL | Status: DC

## 2015-10-14 NOTE — Progress Notes (Signed)
Patient ID: Henry Hill, male   DOB: March 21, 1961, 55 y.o.   MRN: [id number]       Patient: Henry Hill Male    DOB: 1960-11-08   55 y.o.   MRN: [id number] Visit Date: 10/14/2015  Today's Provider: Vernie Murders, PA   Chief Complaint  Patient presents with  . Hypertension   Subjective:    Hypertension This is a chronic problem. The problem is unchanged. Pertinent negatives include no blurred vision, chest pain, headaches, palpitations, peripheral edema or shortness of breath. The current treatment provides moderate improvement. There are no compliance problems.    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 Still has intermittent discomfort in the knee. Dr.Singh (nephrologist) changed BP medication to Nifedipine-ER 60 mg qd with the Benazepril 40 mg qd. Stopped all the HCTZ because of hyponatremia.   Past Medical History  Diagnosis Date  . Essential hypertension   . Varicose veins of both lower extremities    Past Surgical History  Procedure Laterality Date  . Back surgery  2003  . Hemorrhoid surgery N/A 11/13/2014    Procedure: LEFT LATERAL HEMORRHOIDECTOMY  ;  Surgeon: [name], MD;  Location: [facility];  Service: General;  Laterality: N/A;   Family History  Problem Relation Age of Onset  . Heart attack Mother   . Diabetes Father   . Hypertension Father     No Known Allergies   Previous Medications   ACETAMINOPHEN (TYLENOL) 500 MG TABLET    Take 1,000 mg by mouth every 6 (six) hours as needed for mild pain, moderate pain or headache.   BENAZEPRIL (LOTENSIN) 40 MG TABLET    TAKE 40 MG BY MOUTH DAILY   MELOXICAM (MOBIC) 15 MG TABLET    Take 15 mg by mouth daily as needed (knee pain).    MULTIPLE VITAMINS-MINERALS (MULTIVITAMIN ADULT PO)    Take 1 tablet by mouth daily.   NIFEDIPINE (PROCARDIA XL/ADALAT-CC) 60 MG 24 HR TABLET     Take 1 tablet (60 mg total) by mouth daily.    Review of Systems  Eyes: Negative for blurred vision.  Respiratory: Negative for shortness of breath.   Cardiovascular: Negative for chest pain and palpitations.  Neurological: Negative for headaches.    Social History  Substance Use Topics  . Smoking status: Current Every Day Smoker -- 0.75 packs/day    Types: Cigarettes  . Smokeless tobacco: Not on file  . Alcohol Use: 0.0 oz/week    0 Standard drinks or equivalent per week     Comment: MODERATE USE   Objective:   BP 128/82 mmHg  Pulse 80  Temp(Src) 98.7 F (37.1 C)  Resp 16  Ht 6\' 1"  (1.854 m)  Wt 195 lb (88.451 kg)  BMI 25.73 kg/m2  Physical Exam  Constitutional: He is oriented to person, place, and time. He appears well-developed and well-nourished. No distress.  HENT:  Head: Normocephalic and atraumatic.  Right Ear: Hearing normal.  Left Ear: Hearing normal.  Nose: Nose normal.  Eyes: Conjunctivae and lids are normal. Right eye exhibits no discharge. Left eye exhibits no discharge. No scleral icterus.  Neck: Neck supple.  Cardiovascular: Normal rate and regular rhythm.   Pulmonary/Chest: Effort normal and breath sounds normal. No respiratory distress.  Abdominal: Bowel sounds are normal.  Musculoskeletal: Normal range of motion.  Crepitus and soreness  in left knee with good ROM. No swelling, locking or click. Gigantic varicose veins in both legs. No erythema or pain.  Neurological: He is alert and oriented to person, place, and time.  Skin: Skin is intact. No lesion and no rash noted.  Psychiatric: He has a normal mood and affect. His speech is normal and behavior is normal. Thought content normal.      Assessment & Plan:     1. Essential (primary) hypertension Very good control of BP with the Nifedipine and Lotensin now. Had hyponatremia with sodium of 126 Nov. 2015. Nephrologist recommended stopping all the HCTZ use. Will recheck labs. - CBC with  Differential/Platelet - Comprehensive metabolic panel - NIFEdipine (PROCARDIA XL/ADALAT-CC) 60 MG 24 hr tablet; Take 1 tablet (60 mg total) by mouth daily.  Dispense: 30 tablet; Refill: 6 - benazepril (LOTENSIN) 40 MG tablet; TAKE 40 MG BY MOUTH DAILY  Dispense: 30 tablet; Refill: 6  2. Arthropathy, lower leg Having crepitus and discomfort in the left knee with stiffness but no click or locking. Meloxicam helped in the past and requests refill. May need to consider return to orthopedist for possible cortisone injection and further evaluation. - meloxicam (MOBIC) 15 MG tablet; Take 1 tablet (15 mg total) by mouth daily as needed (knee pain).  Dispense: 30 tablet; Refill: 6  3. Leg varices Huge varicose veins in both legs without ulcerations. No success with past vein treatments. Encouraged to continue to use support hose.  4. Hypo-osmolality and hyponatremia Has a history of chronic hyponatremia with referral to Dr. Candiss Norse (nephrologist) Nov. 2015. HBP medications were changed to Procardia 60 mg qd with Lotensin 40 mg qd and all the HCTZ was discontinued. Will recheck CMP. - Comprehensive metabolic panel       Vernie Murders, PA  [facility]

## 2015-10-17 LAB — COMPREHENSIVE METABOLIC PANEL
ALT: 30 IU/L (ref 0–44)
AST: 32 IU/L (ref 0–40)
Albumin/Globulin Ratio: 2.1 (ref 1.2–2.2)
Albumin: 4.5 g/dL (ref 3.5–5.5)
Alkaline Phosphatase: 67 IU/L (ref 39–117)
BUN/Creatinine Ratio: 16 (ref 9–20)
BUN: 10 mg/dL (ref 6–24)
Bilirubin Total: 0.5 mg/dL (ref 0.0–1.2)
CO2: 24 mmol/L (ref 18–29)
Calcium: 9.3 mg/dL (ref 8.7–10.2)
Chloride: 92 mmol/L — ABNORMAL LOW (ref 96–106)
Creatinine, Ser: 0.64 mg/dL — ABNORMAL LOW (ref 0.76–1.27)
GFR calc Af Amer: 127 mL/min/{1.73_m2} (ref >59)
GFR calc non Af Amer: 110 mL/min/{1.73_m2} (ref >59)
Globulin, Total: 2.1 g/dL (ref 1.5–4.5)
Glucose: 100 mg/dL — ABNORMAL HIGH (ref 65–99)
Potassium: 5.2 mmol/L (ref 3.5–5.2)
Sodium: 130 mmol/L — ABNORMAL LOW (ref 134–144)
Total Protein: 6.6 g/dL (ref 6.0–8.5)

## 2015-10-17 LAB — CBC WITH DIFFERENTIAL/PLATELET
Basophils Absolute: 0 10*3/uL (ref 0.0–0.2)
Basos: 1 %
EOS (ABSOLUTE): 0.8 10*3/uL — ABNORMAL HIGH (ref 0.0–0.4)
Eos: 9 %
Hematocrit: 40 % (ref 37.5–51.0)
Hemoglobin: 13.9 g/dL (ref 12.6–17.7)
Immature Grans (Abs): 0 10*3/uL (ref 0.0–0.1)
Immature Granulocytes: 0 %
Lymphocytes Absolute: 2 10*3/uL (ref 0.7–3.1)
Lymphs: 23 %
MCH: 33 pg (ref 26.6–33.0)
MCHC: 34.8 g/dL (ref 31.5–35.7)
MCV: 95 fL (ref 79–97)
Monocytes Absolute: 0.7 10*3/uL (ref 0.1–0.9)
Monocytes: 8 %
Neutrophils Absolute: 5 10*3/uL (ref 1.4–7.0)
Neutrophils: 59 %
Platelets: 333 10*3/uL (ref 150–379)
RBC: 4.21 x10E6/uL (ref 4.14–5.80)
RDW: 12.9 % (ref 12.3–15.4)
WBC: 8.5 10*3/uL (ref 3.4–10.8)

## 2015-10-22 ENCOUNTER — Ambulatory Visit (INDEPENDENT_AMBULATORY_CARE_PROVIDER_SITE_OTHER): Payer: BLUE CROSS/BLUE SHIELD | Admitting: Physician Assistant

## 2015-10-22 ENCOUNTER — Encounter: Payer: Self-pay | Admitting: Physician Assistant

## 2015-10-22 VITALS — BP 140/80 | HR 90 | Temp 98.4°F | Resp 16 | Wt 195.4 lb

## 2015-10-22 DIAGNOSIS — L259 Unspecified contact dermatitis, unspecified cause: Secondary | ICD-10-CM | POA: Diagnosis not present

## 2015-10-22 MED ORDER — TRIAMCINOLONE ACETONIDE 0.1 % EX CREA: 1.0000 "application " | TOPICAL_CREAM | Freq: Two times a day (BID) | CUTANEOUS | Status: DC

## 2015-10-22 MED ORDER — METHYLPREDNISOLONE ACETATE 80 MG/ML IJ SUSP
80.0000 mg | Freq: Once | INTRAMUSCULAR | Status: AC
  Administered 2015-10-22: 80 mg via INTRAMUSCULAR

## 2015-10-22 NOTE — Patient Instructions (Signed)
Contact Dermatitis Dermatitis is redness, soreness, and swelling (inflammation) of the skin. Contact dermatitis is a reaction to certain substances that touch the skin. There are two types of contact dermatitis:   Irritant contact dermatitis. This type is caused by something that irritates your skin, such as dry hands from washing them too much. This type does not require previous exposure to the substance for a reaction to occur. This type is more common.  Allergic contact dermatitis. This type is caused by a substance that you are allergic to, such as a nickel allergy or poison ivy. This type only occurs if you have been exposed to the substance (allergen) before. Upon a repeat exposure, your body reacts to the substance. This type is less common. CAUSES  Many different substances can cause contact dermatitis. Irritant contact dermatitis is most commonly caused by exposure to:   Makeup.   Soaps.   Detergents.   Bleaches.   Acids.   Metal salts, such as nickel.  Allergic contact dermatitis is most commonly caused by exposure to:   Poisonous plants.   Chemicals.   Jewelry.   Latex.   Medicines.   Preservatives in products, such as clothing.  RISK FACTORS This condition is more likely to develop in:   People who have jobs that expose them to irritants or allergens.  People who have certain medical conditions, such as asthma or eczema.  SYMPTOMS  Symptoms of this condition may occur anywhere on your body where the irritant has touched you or is touched by you. Symptoms include:  Dryness or flaking.   Redness.   Cracks.   Itching.   Pain or a burning feeling.   Blisters.  Drainage of small amounts of blood or clear fluid from skin cracks. With allergic contact dermatitis, there may also be swelling in areas such as the eyelids, mouth, or genitals.  DIAGNOSIS  This condition is diagnosed with a medical history and physical exam. A patch skin test  may be performed to help determine the cause. If the condition is related to your job, you may need to see an occupational medicine specialist. TREATMENT Treatment for this condition includes figuring out what caused the reaction and protecting your skin from further contact. Treatment may also include:   Steroid creams or ointments. Oral steroid medicines may be needed in more severe cases.  Antibiotics or antibacterial ointments, if a skin infection is present.  Antihistamine lotion or an antihistamine taken by mouth to ease itching.  A bandage (dressing). HOME CARE INSTRUCTIONS Skin Care  Moisturize your skin as needed.   Apply cool compresses to the affected areas.  Try taking a bath with:  Epsom salts. Follow the instructions on the packaging. You can get these at your local pharmacy or grocery store.  Baking soda. Pour a small amount into the bath as directed by your health care provider.  Colloidal oatmeal. Follow the instructions on the packaging. You can get this at your local pharmacy or grocery store.  Try applying baking soda paste to your skin. Stir water into baking soda until it reaches a paste-like consistency.  Do not scratch your skin.  Bathe less frequently, such as every other day.  Bathe in lukewarm water. Avoid using hot water. Medicines  Take or apply over-the-counter and prescription medicines only as told by your health care provider.   If you were prescribed an antibiotic medicine, take or apply your antibiotic as told by your health care provider. Do not stop using the   antibiotic even if your condition starts to improve. General Instructions  Keep all follow-up visits as told by your health care provider. This is important.  Avoid the substance that caused your reaction. If you do not know what caused it, keep a journal to try to track what caused it. Write down:  What you eat.  What cosmetic products you use.  What you drink.  What  you wear in the affected area. This includes jewelry.  If you were given a dressing, take care of it as told by your health care provider. This includes when to change and remove it. SEEK MEDICAL CARE IF:   Your condition does not improve with treatment.  Your condition gets worse.  You have signs of infection such as swelling, tenderness, redness, soreness, or warmth in the affected area.  You have a fever.  You have new symptoms. SEEK IMMEDIATE MEDICAL CARE IF:   You have a severe headache, neck pain, or neck stiffness.  You vomit.  You feel very sleepy.  You notice red streaks coming from the affected area.  Your bone or joint underneath the affected area becomes painful after the skin has healed.  The affected area turns darker.  You have difficulty breathing.   This information is not intended to replace advice given to you by your health care provider. Make sure you discuss any questions you have with your health care provider.   Document Released: 04/23/2000 Document Revised: 01/15/2015 Document Reviewed: 09/11/2014 Elsevier Interactive Patient Education 2016 [employer] Inc.  

## 2015-10-22 NOTE — Progress Notes (Signed)
       Patient: Henry Hill Male    DOB: 04/24/1961   55 y.o.   MRN: [id number] Visit Date: 10/22/2015  Today's Provider: Mar Daring, PA-C   Chief Complaint  Patient presents with  . Rash   Subjective:    Rash This is a new problem. The current episode started in the past 7 days (Started on Monday). The problem is unchanged. The affected locations include the left hand and right hand. The rash is characterized by redness and itchiness. He was exposed to plant contact (was not poison oak/ivy but another type of vine plant that was growing up between an old piece of lattice he picked up to move.). Pertinent negatives include no congestion, cough, eye pain, fatigue, fever, joint pain, nail changes, rhinorrhea, shortness of breath or sore throat. Treatments tried: Alcohol. The treatment provided no relief. (Has had contact dermatitis before from poison oak many years ago)       No Known Allergies Current Meds  Medication Sig  . acetaminophen (TYLENOL) 500 MG tablet Take 1,000 mg by mouth every 6 (six) hours as needed for mild pain, moderate pain or headache.  . benazepril (LOTENSIN) 40 MG tablet TAKE 40 MG BY MOUTH DAILY  . meloxicam (MOBIC) 15 MG tablet Take 1 tablet (15 mg total) by mouth daily as needed (knee pain).  . Multiple Vitamins-Minerals (MULTIVITAMIN ADULT PO) Take 1 tablet by mouth daily.  Marland Kitchen NIFEdipine (PROCARDIA XL/ADALAT-CC) 60 MG 24 hr tablet Take 1 tablet (60 mg total) by mouth daily.    Review of Systems  Constitutional: Negative for fever and fatigue.  HENT: Negative for congestion, rhinorrhea and sore throat.   Eyes: Negative for pain.  Respiratory: Negative for cough and shortness of breath.   Cardiovascular: Negative.   Gastrointestinal: Negative.   Musculoskeletal: Negative for joint pain.  Skin: Positive for rash. Negative for nail changes.    Social History  Substance Use Topics  . Smoking status: Current Every Day Smoker -- 0.75 packs/day   Types: Cigarettes  . Smokeless tobacco: Not on file  . Alcohol Use: 0.0 oz/week    0 Standard drinks or equivalent per week     Comment: MODERATE USE   Objective:   BP 140/80 mmHg  Pulse 90  Temp(Src) 98.4 F (36.9 C) (Oral)  Resp 16  Wt 195 lb 6.4 oz (88.633 kg)  Physical Exam  Constitutional: He appears well-developed and well-nourished. No distress.  HENT:  Head: Normocephalic and atraumatic.  Cardiovascular: Normal rate, regular rhythm and normal heart sounds.  Exam reveals no gallop and no friction rub.   No murmur heard. Pulmonary/Chest: Effort normal and breath sounds normal. No respiratory distress. He has no wheezes. He has no rales.  Skin: Rash noted. Rash is vesicular. He is not diaphoretic.     Vitals reviewed.       Assessment & Plan:     1. Contact dermatitis Unknown plant contact with dermatitis. Will give steroid injection today in the office that was tolerated well. Triamcinolone cream given to apply BID for itch and irritation. He is to call if symptoms worsen. - methylPREDNISolone acetate (DEPO-MEDROL) injection 80 mg; Inject 1 mL (80 mg total) into the muscle once. - triamcinolone cream (KENALOG) 0.1 %; Apply 1 application topically 2 (two) times daily.  Dispense: 30 g; Refill: 0'      Mar Daring, PA-C  [facility]

## 2015-11-03 ENCOUNTER — Ambulatory Visit
Admission: RE | Admit: 2015-11-03 | Discharge: 2015-11-03 | Disposition: A | Discharge status: Home / Self Care | Payer: BLUE CROSS/BLUE SHIELD | Source: Ambulatory Visit | Attending: Family Medicine | Admitting: Family Medicine

## 2015-11-03 ENCOUNTER — Encounter: Payer: Self-pay | Admitting: Family Medicine

## 2015-11-03 ENCOUNTER — Ambulatory Visit (INDEPENDENT_AMBULATORY_CARE_PROVIDER_SITE_OTHER): Payer: BLUE CROSS/BLUE SHIELD | Admitting: Family Medicine

## 2015-11-03 VITALS — BP 128/62 | HR 96 | Temp 98.8°F | Resp 14 | Wt 194.0 lb

## 2015-11-03 DIAGNOSIS — R0781 Pleurodynia: Secondary | ICD-10-CM | POA: Diagnosis not present

## 2015-11-03 DIAGNOSIS — S2232XA Fracture of one rib, left side, initial encounter for closed fracture: Secondary | ICD-10-CM | POA: Insufficient documentation

## 2015-11-03 DIAGNOSIS — W19XXXA Unspecified fall, initial encounter: Secondary | ICD-10-CM | POA: Diagnosis not present

## 2015-11-03 NOTE — Progress Notes (Signed)
Patient: Henry Hill Male    DOB: 07/24/1960   55 y.o.   MRN: [id number] Visit Date: 11/03/2015  Today's Provider: Vernie Murders, PA   Chief Complaint  Patient presents with  . pain in left side    X 1 day.    Subjective:    HPI Patient c/o pain in his left side since yesterday morning. Patient reports that he fell in his bathroom and thinks he may have broken a rib. Patient reports that he took left over hydrocodone, which helped a little.    Past Medical History  Diagnosis Date  . Essential hypertension   . Varicose veins of both lower extremities    Past Surgical History  Procedure Laterality Date  . Back surgery  2003  . Hemorrhoid surgery N/A 11/13/2014    Procedure: LEFT LATERAL HEMORRHOIDECTOMY  ;  Surgeon: [name], MD;  Location: [facility];  Service: General;  Laterality: N/A;   Family History  Problem Relation Age of Onset  . Heart attack Mother   . Diabetes Father   . Hypertension Father    No Known Allergies   Current Meds  Medication Sig  . acetaminophen (TYLENOL) 500 MG tablet Take 1,000 mg by mouth every 6 (six) hours as needed for mild pain, moderate pain or headache.  . benazepril (LOTENSIN) 40 MG tablet TAKE 40 MG BY MOUTH DAILY  . meloxicam (MOBIC) 15 MG tablet Take 1 tablet (15 mg total) by mouth daily as needed (knee pain).  . Multiple Vitamins-Minerals (MULTIVITAMIN ADULT PO) Take 1 tablet by mouth daily.  Marland Kitchen NIFEdipine (PROCARDIA XL/ADALAT-CC) 60 MG 24 hr tablet Take 1 tablet (60 mg total) by mouth daily.  Marland Kitchen triamcinolone cream (KENALOG) 0.1 % Apply 1 application topically 2 (two) times daily.    Review of Systems  Constitutional: Negative.   Gastrointestinal: Negative for abdominal pain.  Musculoskeletal: Positive for myalgias. Negative for back pain and gait problem.  Neurological: Negative for dizziness, weakness, light-headedness, numbness and headaches.    Social History  Substance Use Topics  . Smoking status: Current  Every Day Smoker -- 0.75 packs/day    Types: Cigarettes  . Smokeless tobacco: Not on file  . Alcohol Use: 0.0 oz/week    0 Standard drinks or equivalent per week     Comment: MODERATE USE   Objective:   BP 128/62 mmHg  Pulse 96  Temp(Src) 98.8 F (37.1 C)  Resp 14  Wt 194 lb (87.998 kg)  Physical Exam  Constitutional: He is oriented to person, place, and time. He appears well-developed and well-nourished. No distress.  HENT:  Head: Normocephalic and atraumatic.  Right Ear: Hearing normal.  Left Ear: Hearing normal.  Nose: Nose normal.  Eyes: Conjunctivae and lids are normal. Right eye exhibits no discharge. Left eye exhibits no discharge. No scleral icterus.  Cardiovascular: Normal rate and regular rhythm.   Pulmonary/Chest: Effort normal and breath sounds normal. No respiratory distress. He exhibits tenderness.  Severe pain to compress left lower lateral ribs or take a deep breath. Prominent 8th or 9th rib. Normal breath sounds throughout.  Abdominal: Soft. Bowel sounds are normal.  Musculoskeletal: Normal range of motion.  Neurological: He is alert and oriented to person, place, and time.  Skin: Skin is intact. No lesion and no rash noted.  Psychiatric: He has a normal mood and affect. His speech is normal and behavior is normal. Thought content normal.      Assessment & Plan:  1. Rib pain on left side Severe pain in left lateral lower ribs to move or take a deep breath since he fell in his bathroom early on 11-02-15. May use Hydrocodone/APAP he has at home (left over from hemorrhoid surgery). Continue rib compression belt and get x-rays. Given note for out of work 11-03-15 through 11-10-15. Recheck in 1 week. - DG Ribs Unilateral Left       Vernie Murders, PA  [facility]

## 2015-11-10 ENCOUNTER — Encounter: Payer: Self-pay | Admitting: Family Medicine

## 2015-11-10 ENCOUNTER — Ambulatory Visit (INDEPENDENT_AMBULATORY_CARE_PROVIDER_SITE_OTHER): Payer: BLUE CROSS/BLUE SHIELD | Admitting: Family Medicine

## 2015-11-10 VITALS — BP 128/74 | HR 88 | Temp 98.7°F | Resp 16 | Ht 73.0 in | Wt 191.0 lb

## 2015-11-10 DIAGNOSIS — S2232XD Fracture of one rib, left side, subsequent encounter for fracture with routine healing: Secondary | ICD-10-CM

## 2015-11-10 NOTE — Patient Instructions (Signed)

## 2015-11-10 NOTE — Progress Notes (Signed)
Patient: Henry Hill Male    DOB: [birth year]   55 y.o.   MRN: [id number] Visit Date: 11/10/2015  Today's Provider: Vernie Murders, PA   Chief Complaint  Patient presents with  . Broken Rib    1 week follow up.    Subjective:    HPI Patient comes in today for a follow up on his broken rib. Patient was seen 1 week ago due to a fall at home, and x-ray confirmed that he had a minimally displaced fracture in his left 8th rib. Patient reports that he is about 50% better than he was last week.  Patient Active Problem List   Diagnosis Date Noted  . Internal and external thrombosed hemorrhoids 11/13/2014  . Anal pain 11/08/2014  . Arthropathy, lower leg 11/01/2014  . Failure of erection 11/01/2014  . 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 Hypo-osmolality and hyponatremia 10/14/2009  . Avitaminosis D 10/14/2009  . Decreased libido 09/11/2009  . Malaise and fatigue 09/11/2009  . Phobia 05/16/2007  . Essential (primary) hypertension 02/04/2006    No Known Allergies   Current Meds  Medication Sig  . acetaminophen (TYLENOL) 500 MG tablet Take 1,000 mg by mouth every 6 (six) hours as needed for mild pain, moderate pain or headache.  . benazepril (LOTENSIN) 40 MG tablet TAKE 40 MG BY MOUTH DAILY  . meloxicam (MOBIC) 15 MG tablet Take 1 tablet (15 mg total) by mouth daily as needed (knee pain).  . Multiple Vitamins-Minerals (MULTIVITAMIN ADULT PO) Take 1 tablet by mouth daily.  Marland Kitchen NIFEdipine (PROCARDIA XL/ADALAT-CC) 60 MG 24 hr tablet Take 1 tablet (60 mg total) by mouth daily.  Marland Kitchen triamcinolone cream (KENALOG) 0.1 % Apply 1 application topically 2 (two) times daily.    Review of Systems  Constitutional: Negative.   Cardiovascular: Negative.   Gastrointestinal: Negative.   Musculoskeletal: Positive for back pain.  Neurological: Negative.     Social History  Substance Use Topics  . Smoking status: Current Every Day Smoker -- 0.75 packs/day    Types: Cigarettes  . Smokeless tobacco: Not  on file  . Alcohol Use: 0.0 oz/week    0 Standard drinks or equivalent per week     Comment: MODERATE USE   Objective:   BP 128/74 mmHg  Pulse 88  Temp(Src) 98.7 F (37.1 C)  Resp 16  Ht 6\' 1"  (1.854 m)  Wt 191 lb (86.637 kg)  BMI 25.20 kg/m2 Wt Readings from Last 3 Encounters:  11/10/15 191 lb (86.637 kg)  11/03/15 194 lb (87.998 kg)  10/22/15 195 lb 6.4 oz (88.633 kg)    Physical Exam  Constitutional: He is oriented to person, place, and time. He appears well-developed and well-nourished. No distress.  HENT:  Head: Normocephalic and atraumatic.  Right Ear: Hearing normal.  Left Ear: Hearing normal.  Nose: Nose normal.  Eyes: Conjunctivae and lids are normal. Right eye exhibits no discharge. Left eye exhibits no discharge. No scleral icterus.  Neck: Neck supple.  Cardiovascular: Normal rate and regular rhythm.   Pulmonary/Chest: Effort normal and breath sounds normal. No respiratory distress. He exhibits tenderness.  Left rib pain with deep breath, sneeze or twisting/turning torso.  Musculoskeletal: Normal range of motion.  Neurological: He is alert and oriented to person, place, and time.  Skin: Skin is intact. No lesion and no rash noted.  Psychiatric: He has a normal mood and affect. His speech is normal and behavior is normal. Thought content normal.  Assessment & Plan:     1. Closed rib fracture, left, with routine healing, subsequent encounter Some improvement in left posterior lateral 8th rib fracture. Pain controlled with rib belt and Aleve. Very painful to sneeze or twist/turn his torso. Unable to go back to work yet. Will recheck in 10-14 days. Given note to extend out of work to include 11-03-15 through 11-20-15.  Vernie Murders, PA  [facility]

## 2015-11-20 ENCOUNTER — Ambulatory Visit (INDEPENDENT_AMBULATORY_CARE_PROVIDER_SITE_OTHER): Payer: BLUE CROSS/BLUE SHIELD | Admitting: Family Medicine

## 2015-11-20 ENCOUNTER — Encounter: Payer: Self-pay | Admitting: Family Medicine

## 2015-11-20 VITALS — BP 118/78 | HR 88 | Temp 98.9°F | Resp 16 | Ht 73.0 in | Wt 190.0 lb

## 2015-11-20 DIAGNOSIS — S2232XD Fracture of one rib, left side, subsequent encounter for fracture with routine healing: Secondary | ICD-10-CM | POA: Diagnosis not present

## 2015-11-20 NOTE — Progress Notes (Signed)
       Patient: Henry Hill Male    DOB: 04-28-1961   55 y.o.   MRN: [id number] Visit Date: 11/20/2015  Today's Provider: Vernie Murders, PA   Chief Complaint  Patient presents with  . Rib Fracture    10 day follow up    Subjective:    HPI Patient comes in today to follow up on his rib fracture. Patient also needs forms filled out for [employer] for his employer.     No Known Allergies No outpatient prescriptions have been marked as taking for the 11/20/15 encounter (Office Visit) with Margo Common, PA.    Review of Systems  Constitutional: Negative.   Cardiovascular: Negative.   Gastrointestinal: Negative.   Musculoskeletal: Positive for myalgias, back pain and arthralgias. Negative for joint swelling.  Skin: Negative.     Social History  Substance Use Topics  . Smoking status: Current Every Day Smoker -- 0.75 packs/day    Types: Cigarettes  . Smokeless tobacco: Not on file  . Alcohol Use: 0.0 oz/week    0 Standard drinks or equivalent per week     Comment: MODERATE USE   Objective:   BP 118/78 mmHg  Pulse 88  Temp(Src) 98.9 F (37.2 C)  Resp 16  Ht 6\' 1"  (1.854 m)  Wt 190 lb (86.183 kg)  BMI 25.07 kg/m2  Physical Exam  Constitutional: He is oriented to person, place, and time. He appears well-developed and well-nourished. No distress.  HENT:  Head: Normocephalic and atraumatic.  Right Ear: Hearing normal.  Left Ear: Hearing normal.  Nose: Nose normal.  Eyes: Conjunctivae and lids are normal. Right eye exhibits no discharge. Left eye exhibits no discharge. No scleral icterus.  Cardiovascular: Normal rate and regular rhythm.   Pulmonary/Chest: Effort normal. No respiratory distress. He exhibits tenderness.  Left anterior-lateral 8th rib pain with prominence of contour anterior.  Musculoskeletal: Normal range of motion.  Neurological: He is alert and oriented to person, place, and time.  Skin: Skin is intact. No lesion and no rash noted.  Psychiatric: He  has a normal mood and affect. His speech is normal and behavior is normal. Thought content normal.      Assessment & Plan:     1. Left rib fracture, with routine healing, subsequent encounter Fractured left 8th rib with minimal displacement on x-ray 11-03-15. Out of work since 11-04-15 and expect follow up na repeat x-ray on 12-04-15 to see if he can be released to go back to work. Continue rib belt to splint ribs and may continue OTC analgesic as needed. Restrict all activities.        Vernie Murders, PA  [facility]

## 2015-12-04 ENCOUNTER — Ambulatory Visit
Admission: RE | Admit: 2015-12-04 | Discharge: 2015-12-04 | Disposition: A | Discharge status: Home / Self Care | Payer: BLUE CROSS/BLUE SHIELD | Source: Ambulatory Visit | Attending: Family Medicine | Admitting: Family Medicine

## 2015-12-04 ENCOUNTER — Encounter: Payer: Self-pay | Admitting: Family Medicine

## 2015-12-04 ENCOUNTER — Ambulatory Visit (INDEPENDENT_AMBULATORY_CARE_PROVIDER_SITE_OTHER): Payer: BLUE CROSS/BLUE SHIELD | Admitting: Family Medicine

## 2015-12-04 VITALS — BP 130/88 | HR 91 | Temp 97.9°F | Resp 16 | Wt 190.0 lb

## 2015-12-04 DIAGNOSIS — X58XXXD Exposure to other specified factors, subsequent encounter: Secondary | ICD-10-CM | POA: Insufficient documentation

## 2015-12-04 DIAGNOSIS — S2232XD Fracture of one rib, left side, subsequent encounter for fracture with routine healing: Secondary | ICD-10-CM

## 2015-12-04 NOTE — Progress Notes (Signed)
       Patient: Henry Hill Male    DOB: July 25, 1960   55 y.o.   MRN: [id number] Visit Date: 12/04/2015  Today's Provider: Vernie Murders, PA   Chief Complaint  Patient presents with  . Follow-up   Subjective:    HPI  Left rib fracture, with routine healing, subsequent encounter: From 11/20/15-Fractured left 8th rib with minimal displacement on x-ray 11-03-15. Out of work since 11-04-15 and expect follow up na repeat x-ray on 12-04-15 to see if he can be released to go back to work. Continue rib belt to splint ribs and may continue OTC analgesic as needed. Restrict all activities. Follow-up in 2 weeks.     No Known Allergies Current Meds  Medication Sig  . acetaminophen (TYLENOL) 500 MG tablet Take 1,000 mg by mouth every 6 (six) hours as needed for mild pain, moderate pain or headache.  . benazepril (LOTENSIN) 40 MG tablet TAKE 40 MG BY MOUTH DAILY  . meloxicam (MOBIC) 15 MG tablet Take 1 tablet (15 mg total) by mouth daily as needed (knee pain).  . Multiple Vitamins-Minerals (MULTIVITAMIN ADULT PO) Take 1 tablet by mouth daily.  Marland Kitchen NIFEdipine (PROCARDIA XL/ADALAT-CC) 60 MG 24 hr tablet Take 1 tablet (60 mg total) by mouth daily.  Marland Kitchen triamcinolone cream (KENALOG) 0.1 % Apply 1 application topically 2 (two) times daily.    Review of Systems  Constitutional: Negative for appetite change, chills and fever.  Respiratory: Negative for chest tightness, shortness of breath and wheezing.   Cardiovascular: Negative for chest pain and palpitations.  Gastrointestinal: Negative for abdominal pain, nausea and vomiting.    Social History  Substance Use Topics  . Smoking status: Current Every Day Smoker    Packs/day: 0.75    Types: Cigarettes  . Smokeless tobacco: Not on file  . Alcohol use 0.0 oz/week     Comment: MODERATE USE   Objective:   BP (!) 150/90 (BP Location: Left Arm, Patient Position: Sitting, Cuff Size: Large)   Pulse 91   Temp 97.9 F (36.6 C) (Oral)   Resp 16   Wt  190 lb (86.2 kg)   SpO2 97%   BMI 25.07 kg/m  BP Readings from Last 3 Encounters:  12/04/15 (!) 150/90  11/20/15 118/78  11/10/15 128/74    Physical Exam  Constitutional: He is oriented to person, place, and time. He appears well-developed and well-nourished.  HENT:  Head: Normocephalic.  Eyes: Conjunctivae are normal.  Neck: Neck supple.  Cardiovascular: Normal rate and regular rhythm.   Pulmonary/Chest: Effort normal. He exhibits tenderness.  Mild tenderness left lateral 8th rib. Left lower ribs prominent anteriorly. Few fine rales.  Neurological: He is alert and oriented to person, place, and time.      Assessment & Plan:     1. Fracture, rib, left, with routine healing, subsequent encounter Still having tenderness in the left lower lateral ribs with prominent deformity anteriorly. Feeling slow improvement. Some questionable fine rales heard today. Will get follow up x-ray evaluation to check for healing. Will probably be able to go back to full duties at work on 12-15-15. Final follow up expected on 12-13-15. - DG Ribs Unilateral Left; Future       Vernie Murders, PA  [facility]

## 2015-12-09 ENCOUNTER — Encounter: Payer: Self-pay | Admitting: Family Medicine

## 2015-12-09 ENCOUNTER — Ambulatory Visit (INDEPENDENT_AMBULATORY_CARE_PROVIDER_SITE_OTHER): Payer: BLUE CROSS/BLUE SHIELD | Admitting: Family Medicine

## 2015-12-09 VITALS — BP 136/82 | HR 80 | Temp 98.3°F | Ht 73.0 in | Wt 190.0 lb

## 2015-12-09 DIAGNOSIS — L02429 Furuncle of limb, unspecified: Secondary | ICD-10-CM | POA: Diagnosis not present

## 2015-12-09 MED ORDER — CEPHALEXIN 500 MG PO CAPS: 500.0000 mg | ORAL_CAPSULE | Freq: Four times a day (QID) | ORAL | 0 refills | Status: DC

## 2015-12-09 NOTE — Progress Notes (Signed)
Patient: Henry Hill Male    DOB: 1961-01-26   55 y.o.   MRN: [id number] Visit Date: 12/09/2015  Today's Provider: Vernie Murders, PA   Chief Complaint  Patient presents with  . Cyst    X 4 days.    Subjective:    HPI Patient comes in today c/o cyst in his right leg. Patient reports that he has had this before, but it has never felt "hard". Patient reports that it has been there for the last 4 days. Patient also mentions that it is tender to touch. Patient has been using ice and heat with no relief.  Patient Active Problem List   Diagnosis Date Noted  . Internal and external thrombosed hemorrhoids 11/13/2014  . Anal pain 11/08/2014  . Arthropathy, lower leg 11/01/2014  . Failure of erection 11/01/2014  . 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 Hypo-osmolality and hyponatremia 10/14/2009  . Avitaminosis D 10/14/2009  . Decreased libido 09/11/2009  . Malaise and fatigue 09/11/2009  . Phobia 05/16/2007  . Essential (primary) hypertension 02/04/2006   Past Surgical History:  Procedure Laterality Date  . BACK SURGERY  2003  . HEMORRHOID SURGERY N/A 11/13/2014   Procedure: LEFT LATERAL HEMORRHOIDECTOMY  ;  Surgeon: [name], MD;  Location: [facility];  Service: General;  Laterality: N/A;   Family History  Problem Relation Age of Onset  . Heart attack Mother   . Diabetes Father   . Hypertension Father    No Known Allergies   Current Meds  Medication Sig  . acetaminophen (TYLENOL) 500 MG tablet Take 1,000 mg by mouth every 6 (six) hours as needed for mild pain, moderate pain or headache.  . benazepril (LOTENSIN) 40 MG tablet TAKE 40 MG BY MOUTH DAILY  . meloxicam (MOBIC) 15 MG tablet Take 1 tablet (15 mg total) by mouth daily as needed (knee pain).  . Multiple Vitamins-Minerals (MULTIVITAMIN ADULT PO) Take 1 tablet by mouth daily.  Marland Kitchen NIFEdipine (PROCARDIA XL/ADALAT-CC) 60 MG 24 hr tablet Take 1 tablet (60 mg total) by mouth daily.  Marland Kitchen triamcinolone cream (KENALOG) 0.1 % Apply 1  application topically 2 (two) times daily.    Review of Systems  Constitutional: Negative for activity change, appetite change, chills, diaphoresis, fatigue, fever and unexpected weight change.  Musculoskeletal: Positive for myalgias. Negative for arthralgias, gait problem and joint swelling.       Due to rib fracture a few weeks ago.   Skin: Negative for color change, pallor, rash and wound.  Neurological: Negative.     Social History  Substance Use Topics  . Smoking status: Current Every Day Smoker    Packs/day: 0.75    Types: Cigarettes  . Smokeless tobacco: Not on file  . Alcohol use 0.0 oz/week     Comment: MODERATE USE   Objective:   BP 136/82 (BP Location: Right Arm, Patient Position: Sitting, Cuff Size: Normal)   Pulse 80   Temp 98.3 F (36.8 C)   Ht 6\' 1"  (1.854 m)   Wt 190 lb (86.2 kg)   BMI 25.07 kg/m   Physical Exam  Constitutional: He is oriented to person, place, and time. He appears well-developed and well-nourished.  Neurological: He is alert and oriented to person, place, and time.  Skin: There is erythema.  3 cm swelling distal inner right thigh with surrounding erythema and tenderness. Many very large varicose veins in both legs from thighs down. No local lymphadenopathy.      Assessment &  Plan:     1. Furuncle of thigh Onset with swelling 4 days ago. Has progressed to redness and feeling hard. No fever or local lymphadenopathy. Will treat with antibiotic and warm compresses. Recheck progress in 3 days to see if I&D needed. - cephALEXin (KEFLEX) 500 MG capsule; Take 1 capsule (500 mg total) by mouth 4 (four) times daily.  Dispense: 30 capsule; Refill: [place], PA  [facility]

## 2015-12-12 ENCOUNTER — Ambulatory Visit (INDEPENDENT_AMBULATORY_CARE_PROVIDER_SITE_OTHER): Payer: BLUE CROSS/BLUE SHIELD | Admitting: Family Medicine

## 2015-12-12 ENCOUNTER — Encounter: Payer: Self-pay | Admitting: Family Medicine

## 2015-12-12 VITALS — BP 132/78 | HR 72 | Temp 97.6°F

## 2015-12-12 DIAGNOSIS — L02429 Furuncle of limb, unspecified: Secondary | ICD-10-CM | POA: Diagnosis not present

## 2015-12-12 DIAGNOSIS — S2242XD Multiple fractures of ribs, left side, subsequent encounter for fracture with routine healing: Secondary | ICD-10-CM

## 2015-12-12 NOTE — Progress Notes (Signed)
       Patient: Henry Hill Male    DOB: 21-Mar-1961   55 y.o.   MRN: [id number] Visit Date: 12/12/2015  Today's Provider: Vernie Murders, PA   Chief Complaint  Patient presents with  . Furuncle of thigh  . Rib Fracture  . Follow-up   Subjective:    HPI Patient comes in today for a follow up on his rib fracture. Patient was also seen earlier this week due to a furuncle in his right thigh.  Patient has been tolerating the abx well. He does reports that the furuncle has decreased in size, and is a little less tender.     No Known Allergies No outpatient prescriptions have been marked as taking for the 12/12/15 encounter (Office Visit) with Margo Common, PA.    Review of Systems  Constitutional: Negative.   Respiratory: Negative.   Cardiovascular: Negative.   Musculoskeletal: Positive for myalgias. Negative for back pain.  Skin: Negative for color change, rash and wound.  Neurological: Negative.     Social History  Substance Use Topics  . Smoking status: Current Every Day Smoker    Packs/day: 0.75    Types: Cigarettes  . Smokeless tobacco: Not on file  . Alcohol use 0.0 oz/week     Comment: MODERATE USE   Objective:   BP 132/78 (BP Location: Right Arm, Patient Position: Sitting, Cuff Size: Normal)   Pulse 72   Temp 97.6 F (36.4 C)   Physical Exam  Constitutional: He is oriented to person, place, and time. He appears well-developed and well-nourished. No distress.  HENT:  Head: Normocephalic and atraumatic.  Right Ear: Hearing normal.  Left Ear: Hearing normal.  Nose: Nose normal.  Eyes: Conjunctivae and lids are normal. Right eye exhibits no discharge. Left eye exhibits no discharge. No scleral icterus.  Neck: Neck supple.  Cardiovascular: Normal rate and regular rhythm.   Pulmonary/Chest: Effort normal and breath sounds normal. No respiratory distress.  Abdominal: Bowel sounds are normal.  Musculoskeletal: Normal range of motion.  Neurological: He is  alert and oriented to person, place, and time.  Skin: Skin is intact. No lesion and no rash noted.  Furuncle right distal inner thigh improved. Smaller and no redness now.   Psychiatric: He has a normal mood and affect. His speech is normal and behavior is normal. Thought content normal.      Assessment & Plan:     1. Multiple rib fractures, left, with routine healing, subsequent encounter Feeling improved. Only uses rib binder at night now. Pain occurs if he lies on the left side. Final report of follow up x-rays on 12-04-15 showed healing of fractures in the left 6th, 7th and 8th ribs laterally. May return to work on 12-23-15 with limitations in lifting (10-15 lbs) and may wear rib belt prn. Recheck as needed.  2. Furuncle of thigh Improved with use of antibiotics. No redness or drainage today. Some soreness remains and lesion is a bit smaller. Will finish all the antibiotic and recheck if it does not continue to resolve in week.      Vernie Murders, PA  [facility]

## 2015-12-23 ENCOUNTER — Encounter: Payer: Self-pay | Admitting: Family Medicine

## 2015-12-23 ENCOUNTER — Ambulatory Visit (INDEPENDENT_AMBULATORY_CARE_PROVIDER_SITE_OTHER): Payer: BLUE CROSS/BLUE SHIELD | Admitting: Family Medicine

## 2015-12-23 VITALS — BP 110/82 | HR 76 | Temp 98.1°F | Resp 16 | Wt 190.8 lb

## 2015-12-23 DIAGNOSIS — R2241 Localized swelling, mass and lump, right lower limb: Secondary | ICD-10-CM

## 2015-12-23 NOTE — Progress Notes (Signed)
Patient: Henry Hill Male    DOB: 26-Mar-1961   55 y.o.   MRN: [id number] Visit Date: 12/23/2015  Today's Provider: Vernie Murders, PA   Chief Complaint  Patient presents with  . Follow-up   Subjective:    HPI Patient comes in today for a follow up on furuncle of right thigh.  Patient has completed antibiotic course. He does reports that the furuncle has decreased in size, and is a little less tender. Patient is requesting a surgeon referral.    Patient Active Problem List   Diagnosis Date Noted  . Internal and external thrombosed hemorrhoids 11/13/2014  . Anal pain 11/08/2014  . Arthropathy, lower leg 11/01/2014  . Failure of erection 11/01/2014  . 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 Hypo-osmolality and hyponatremia 10/14/2009  . Avitaminosis D 10/14/2009  . Decreased libido 09/11/2009  . Malaise and fatigue 09/11/2009  . Phobia 05/16/2007  . Essential (primary) hypertension 02/04/2006   Past Surgical History:  Procedure Laterality Date  . BACK SURGERY  2003  . HEMORRHOID SURGERY N/A 11/13/2014   Procedure: LEFT LATERAL HEMORRHOIDECTOMY  ;  Surgeon: [name], MD;  Location: [facility];  Service: General;  Laterality: N/A;   Family History  Problem Relation Age of Onset  . Heart attack Mother   . Diabetes Father   . Hypertension Father      Previous Medications   ACETAMINOPHEN (TYLENOL) 500 MG TABLET    Take 1,000 mg by mouth every 6 (six) hours as needed for mild pain, moderate pain or headache.   BENAZEPRIL (LOTENSIN) 40 MG TABLET    TAKE 40 MG BY MOUTH DAILY   MELOXICAM (MOBIC) 15 MG TABLET    Take 1 tablet (15 mg total) by mouth daily as needed (knee pain).   MULTIPLE VITAMINS-MINERALS (MULTIVITAMIN ADULT PO)    Take 1 tablet by mouth daily.   NIFEDIPINE (PROCARDIA XL/ADALAT-CC) 60 MG 24 HR TABLET    Take 1 tablet (60 mg total) by mouth daily.   TRIAMCINOLONE CREAM (KENALOG) 0.1 %    Apply 1 application topically 2 (two) times daily.    Review of Systems    Constitutional: Negative.   Cardiovascular: Negative.   Musculoskeletal:       Furuncle of right thigh    Social History  Substance Use Topics  . Smoking status: Current Every Day Smoker    Packs/day: 0.75    Types: Cigarettes  . Smokeless tobacco: Never Used  . Alcohol use 0.0 oz/week     Comment: MODERATE USE   Objective:   BP 110/82 (BP Location: Right Arm, Patient Position: Sitting, Cuff Size: Normal)   Pulse 76   Temp 98.1 F (36.7 C) (Oral)   Resp 16   Wt 190 lb 12.8 oz (86.5 kg)   BMI 25.17 kg/m   Physical Exam  Constitutional: He is oriented to person, place, and time. He appears well-developed and well-nourished. No distress.  HENT:  Head: Normocephalic and atraumatic.  Right Ear: Hearing normal.  Left Ear: Hearing normal.  Nose: Nose normal.  Eyes: Conjunctivae and lids are normal. Right eye exhibits no discharge. Left eye exhibits no discharge. No scleral icterus.  Pulmonary/Chest: Effort normal. No respiratory distress.  Musculoskeletal: Normal range of motion.  Neurological: He is alert and oriented to person, place, and time.  Skin: Skin is intact. No lesion noted.  3-4 cm cystic lesion remains on inner thigh near right knee. No significant tenderness. No further redness.  Psychiatric: He has a normal  mood and affect. His speech is normal and behavior is normal. Thought content normal.      Assessment & Plan:     1. Mass of right thigh Persistent mass in the right inner distal thigh. Has finished all antibiotic treatment for furuncle. No drainage. Recommend surgical referral for possible excision. Questionable hematoma. Recheck prn. - Ambulatory referral to General Surgery

## 2015-12-25 ENCOUNTER — Telehealth: Payer: Self-pay | Admitting: Family Medicine

## 2015-12-25 DIAGNOSIS — L0292 Furuncle, unspecified: Secondary | ICD-10-CM

## 2015-12-25 MED ORDER — CEPHALEXIN 500 MG PO CAPS: 500.0000 mg | ORAL_CAPSULE | Freq: Two times a day (BID) | ORAL | 0 refills | Status: DC

## 2015-12-25 NOTE — Telephone Encounter (Signed)
Pt called wanting to know if he can get another antibiotic.   He has finished the one he was given on Tuesday when he was in to see [place].  He said the place on his leg is looking a little red around it.  He has an appt. With the surgeon on the 21st.  But does not want it to get worse.  His call back is [place],  Thanks, [employer]

## 2015-12-25 NOTE — Telephone Encounter (Signed)
Keflex sent to [facility]

## 2016-01-08 ENCOUNTER — Other Ambulatory Visit: Payer: Self-pay | Admitting: General Surgery

## 2016-01-08 DIAGNOSIS — R2241 Localized swelling, mass and lump, right lower limb: Secondary | ICD-10-CM

## 2016-01-22 ENCOUNTER — Ambulatory Visit
Admission: RE | Admit: 2016-01-22 | Discharge: 2016-01-22 | Disposition: A | Discharge status: Home / Self Care | Payer: BLUE CROSS/BLUE SHIELD | Source: Ambulatory Visit | Attending: General Surgery | Admitting: General Surgery

## 2016-01-22 DIAGNOSIS — R2241 Localized swelling, mass and lump, right lower limb: Secondary | ICD-10-CM

## 2016-01-22 DIAGNOSIS — X58XXXA Exposure to other specified factors, initial encounter: Secondary | ICD-10-CM | POA: Insufficient documentation

## 2016-01-22 DIAGNOSIS — S7011XA Contusion of right thigh, initial encounter: Secondary | ICD-10-CM | POA: Diagnosis not present

## 2016-01-22 LAB — POCT I-STAT CREATININE: Creatinine, Ser: 0.6 mg/dL — ABNORMAL LOW (ref 0.61–1.24)

## 2016-01-22 MED ORDER — GADOBENATE DIMEGLUMINE 529 MG/ML IV SOLN
20.0000 mL | Freq: Once | INTRAVENOUS | Status: AC | PRN
  Administered 2016-01-22: 19 mL via INTRAVENOUS

## 2016-02-19 ENCOUNTER — Encounter: Payer: Self-pay | Admitting: Family Medicine

## 2016-02-19 ENCOUNTER — Ambulatory Visit (INDEPENDENT_AMBULATORY_CARE_PROVIDER_SITE_OTHER): Payer: BLUE CROSS/BLUE SHIELD | Admitting: Family Medicine

## 2016-02-19 VITALS — BP 132/80 | HR 88 | Temp 97.9°F | Resp 16 | Wt 193.2 lb

## 2016-02-19 DIAGNOSIS — L089 Local infection of the skin and subcutaneous tissue, unspecified: Secondary | ICD-10-CM | POA: Diagnosis not present

## 2016-02-19 DIAGNOSIS — Z23 Encounter for immunization: Secondary | ICD-10-CM

## 2016-02-19 DIAGNOSIS — S80812A Abrasion, left lower leg, initial encounter: Secondary | ICD-10-CM | POA: Diagnosis not present

## 2016-02-19 MED ORDER — CEPHALEXIN 500 MG PO CAPS: 500.0000 mg | ORAL_CAPSULE | Freq: Two times a day (BID) | ORAL | 0 refills | Status: DC

## 2016-02-19 NOTE — Progress Notes (Signed)
Patient: Henry Hill Male    DOB: [birth year]   55 y.o.   MRN: [id number] Visit Date: 02/19/2016  Today's Provider: Vernie Murders, PA   Chief Complaint  Patient presents with  . Leg Injury   Subjective:    HPI Patient comes in office today to address wound on left leg. Patient reports that 3 weeks ago he had scrapped his leg against ladder and since than has kept bandage over open wound. Patient states that he has noticed that wound has not fully healed, he denies any drainage or fever. Patient reports that his last tetanus shot was 11-10-2003 at the [facility].    Past Medical History:  Diagnosis Date  . Essential hypertension   . Varicose veins of both lower extremities    Past Surgical History:  Procedure Laterality Date  . BACK SURGERY  2003  . HEMORRHOID SURGERY N/A 11/13/2014   Procedure: LEFT LATERAL HEMORRHOIDECTOMY  ;  Surgeon: [name], MD;  Location: [facility];  Service: General;  Laterality: N/A;   Family History  Problem Relation Age of Onset  . Heart attack Mother   . Diabetes Father   . Hypertension Father    No Known Allergies  Current Outpatient Prescriptions:  .  acetaminophen (TYLENOL) 500 MG tablet, Take 1,000 mg by mouth every 6 (six) hours as needed for mild pain, moderate pain or headache., Disp: , Rfl:  .  benazepril (LOTENSIN) 40 MG tablet, TAKE 40 MG BY MOUTH DAILY, Disp: 30 tablet, Rfl: 6 .  meloxicam (MOBIC) 15 MG tablet, Take 1 tablet (15 mg total) by mouth daily as needed (knee pain)., Disp: 30 tablet, Rfl: 6 .  Multiple Vitamins-Minerals (MULTIVITAMIN ADULT PO), Take 1 tablet by mouth daily., Disp: , Rfl:  .  NIFEdipine (PROCARDIA XL/ADALAT-CC) 60 MG 24 hr tablet, Take 1 tablet (60 mg total) by mouth daily., Disp: 30 tablet, Rfl: 6  Review of Systems  Constitutional: Negative.   HENT: Negative.   Eyes: Negative.   Respiratory: Negative.   Cardiovascular: Negative.   Gastrointestinal: Negative.   Endocrine:  Negative.   Genitourinary: Negative.   Musculoskeletal: Negative.   Skin: Positive for wound. Negative for color change, pallor and rash.  Allergic/Immunologic: Negative.   Hematological: Negative.   Psychiatric/Behavioral: Negative.     Social History  Substance Use Topics  . Smoking status: Current Every Day Smoker    Packs/day: 0.75    Types: Cigarettes  . Smokeless tobacco: Never Used  . Alcohol use 0.0 oz/week     Comment: MODERATE USE   Objective:   BP 132/80   Pulse 88   Temp 97.9 F (36.6 C) (Oral)   Resp 16   Wt 193 lb 3.2 oz (87.6 kg)   BMI 25.49 kg/m   Physical Exam  Constitutional: He is oriented to person, place, and time. He appears well-developed and well-nourished. No distress.  HENT:  Head: Normocephalic and atraumatic.  Right Ear: Hearing normal.  Left Ear: Hearing normal.  Nose: Nose normal.  Eyes: Conjunctivae and lids are normal. Right eye exhibits no discharge. Left eye exhibits no discharge. No scleral icterus.  Pulmonary/Chest: Effort normal. No respiratory distress.  Musculoskeletal: Normal range of motion.  Neurological: He is alert and oriented to person, place, and time.  Skin: Skin is intact. No lesion and no rash noted.  Scabbed 1 cm sore on the left shin with surrounding erythema and tenderness. No purulent drainage or lymphangitis or lymphadenopathy.  Psychiatric: He has a normal mood and affect. His speech is normal and behavior is normal. Thought content normal.      Assessment & Plan:     1. Infected abrasion of left lower extremity, initial encounter Onset of scraping left shin on a metal ladder 3 weeks ago. Very slow to heal. Has a scab that is tender with surrounding erythema. No drainage. Will treat with Keflex and up date tetanus booster. Cover with Band-Aid with Neosporin ointment. Recheck if no better in a week. - cephALEXin (KEFLEX) 500 MG capsule; Take 1 capsule (500 mg total) by mouth 2 (two) times daily.  Dispense: 14  capsule; Refill: 0  2. Need for tetanus booster - Tdap vaccine greater than or equal to 7yo IM       Vernie Murders, PA  [facility]

## 2016-05-10 HISTORY — PX: TOTAL KNEE ARTHROPLASTY: SHX125

## 2016-05-10 HISTORY — PX: JOINT REPLACEMENT: SHX530

## 2016-09-30 ENCOUNTER — Ambulatory Visit (INDEPENDENT_AMBULATORY_CARE_PROVIDER_SITE_OTHER): Payer: 59 | Admitting: Physician Assistant

## 2016-09-30 ENCOUNTER — Encounter: Payer: Self-pay | Admitting: Physician Assistant

## 2016-09-30 VITALS — BP 152/96 | HR 96 | Temp 98.1°F | Wt 192.8 lb

## 2016-09-30 DIAGNOSIS — L989 Disorder of the skin and subcutaneous tissue, unspecified: Secondary | ICD-10-CM

## 2016-09-30 DIAGNOSIS — B009 Herpesviral infection, unspecified: Secondary | ICD-10-CM

## 2016-09-30 MED ORDER — VALACYCLOVIR HCL 1 G PO TABS: 1000.0000 mg | ORAL_TABLET | Freq: Two times a day (BID) | ORAL | 0 refills | Status: AC

## 2016-09-30 NOTE — Progress Notes (Signed)
Patient: Henry Hill Male    DOB: 1960-08-04   56 y.o.   MRN: [id number] Visit Date: 09/30/2016  Today's Provider: Trinna Post, PA-C   Chief Complaint  Patient presents with  . Rash   Subjective:    Rash  This is a new problem. Episode onset: 3 days. The problem has been gradually worsening since onset. Location: bottom of right foot and right side of buttocks. The rash is characterized by pain and redness. He was exposed to nothing. Past treatments include nothing.   Cristofher Livecchi is a 56 y/o male presenting today for two separate rashes. The first is on his right buttock. He first noticed it three days ago. It is itchy and burning. Not weeping or bloody. Never had this before.  The second is a rash on the right sole of his foot. That popped up three days ago as well. It looks like brown dots and is painful. Hasn't had it before. Hasn't tried anything for it.    Previous Medications   ACETAMINOPHEN (TYLENOL) 500 MG TABLET    Take 1,000 mg by mouth every 6 (six) hours as needed for mild pain, moderate pain or headache.   BENAZEPRIL (LOTENSIN) 40 MG TABLET    TAKE 40 MG BY MOUTH DAILY   CEPHALEXIN (KEFLEX) 500 MG CAPSULE    Take 1 capsule (500 mg total) by mouth 2 (two) times daily.   MELOXICAM (MOBIC) 15 MG TABLET    Take 1 tablet (15 mg total) by mouth daily as needed (knee pain).   MULTIPLE VITAMINS-MINERALS (MULTIVITAMIN ADULT PO)    Take 1 tablet by mouth daily.   NIFEDIPINE (PROCARDIA XL/ADALAT-CC) 60 MG 24 HR TABLET    Take 1 tablet (60 mg total) by mouth daily.    Review of Systems  Skin: Positive for rash.    Social History  Substance Use Topics  . Smoking status: Current Every Day Smoker    Packs/day: 0.75    Types: Cigarettes  . Smokeless tobacco: Never Used  . Alcohol use 0.0 oz/week     Comment: MODERATE USE   Objective:   BP (!) 152/96 (BP Location: Left Arm, Patient Position: Sitting, Cuff Size: Normal)   Pulse 96   Temp 98.1 F (36.7 C) (Oral)   Wt  192 lb 12.8 oz (87.5 kg)   SpO2 97%   BMI 25.44 kg/m   Physical Exam  Constitutional: He is oriented to person, place, and time.  Cardiovascular: Normal rate.   Cap refill < 3 seconds in bilateral lower extremities.  Pulmonary/Chest: Effort normal.  Musculoskeletal: Normal range of motion.  Neurological: He is alert and oriented to person, place, and time.  Skin: Skin is warm and dry. Rash noted. There is erythema.  Bilateral lower extremities are warm and well perfused. There is significant amount of varicose veins. Right sole of foot has small brown lesions, no raised portion of skin. Tender to palpation. There is also a cluster of dried, crusted vesicular lesions on right buttock.   Psychiatric: He has a normal mood and affect. His behavior is normal.    Media Information     Document Information   Photos  Right buttock  09/30/2016 10:38 AM  Attached To:  Office Visit on 09/30/16 with Trinna Post, PA-C  Source Information   Paulene Floor  [facility]       Assessment & Plan:     1. Herpetic lesion  - valACYclovir (VALTREX) 1000  MG tablet; Take 1 tablet (1,000 mg total) by mouth 2 (two) times daily.  Dispense: 14 tablet; Refill: 0  2. Lesion of skin of foot  - Ambulatory referral to Dermatology   Return if symptoms worsen or fail to improve.  The entirety of the information documented in the History of Present Illness, Review of Systems and Physical Exam were personally obtained by me. Portions of this information were initially documented by Tiburcio Pea and reviewed by me for thoroughness and accuracy.

## 2016-11-11 ENCOUNTER — Encounter: Payer: Self-pay | Admitting: Family Medicine

## 2016-11-11 ENCOUNTER — Ambulatory Visit (INDEPENDENT_AMBULATORY_CARE_PROVIDER_SITE_OTHER): Payer: 59 | Admitting: Family Medicine

## 2016-11-11 VITALS — BP 138/92 | HR 97 | Temp 98.3°F | Wt 191.0 lb

## 2016-11-11 DIAGNOSIS — M170 Bilateral primary osteoarthritis of knee: Secondary | ICD-10-CM

## 2016-11-11 DIAGNOSIS — I8390 Asymptomatic varicose veins of unspecified lower extremity: Secondary | ICD-10-CM | POA: Diagnosis not present

## 2016-11-11 DIAGNOSIS — M25562 Pain in left knee: Secondary | ICD-10-CM | POA: Diagnosis not present

## 2016-11-11 MED ORDER — MELOXICAM 15 MG PO TABS: 15.0000 mg | ORAL_TABLET | Freq: Every day | ORAL | 6 refills | Status: DC | PRN

## 2016-11-11 NOTE — Progress Notes (Signed)
Patient: Henry Hill Male    DOB: 1961/04/10   56 y.o.   MRN: [id number] Visit Date: 11/11/2016  Today's Provider: Vernie Murders, PA   Chief Complaint  Patient presents with  . Knee Pain   Subjective:    Knee Pain   Incident onset: Saturday. There was no injury mechanism. The pain is present in the left knee. The quality of the pain is described as burning and aching. Pain scale: 0/10 without movement 8/10 with movement. The pain has been intermittent since onset. The symptoms are aggravated by movement and weight bearing. Treatments tried: Motrin & Meloxicam. The treatment provided mild relief.   Patient Active Problem List   Diagnosis Date Noted  . Internal and external thrombosed hemorrhoids 11/13/2014  . Anal pain 11/08/2014  . Arthropathy, lower leg 11/01/2014  . Failure of erection 11/01/2014  . 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 Hypo-osmolality and hyponatremia 10/14/2009  . Avitaminosis D 10/14/2009  . Decreased libido 09/11/2009  . Malaise and fatigue 09/11/2009  . Phobia 05/16/2007  . Essential (primary) hypertension 02/04/2006   Past Surgical History:  Procedure Laterality Date  . BACK SURGERY  2003  . HEMORRHOID SURGERY N/A 11/13/2014   Procedure: LEFT LATERAL HEMORRHOIDECTOMY  ;  Surgeon: [name], MD;  Location: [facility];  Service: General;  Laterality: N/A;   Family History  Problem Relation Age of Onset  . Heart attack Mother   . Diabetes Father   . Hypertension Father    No Known Allergies  Previous Medications   ACETAMINOPHEN (TYLENOL) 500 MG TABLET    Take 1,000 mg by mouth every 6 (six) hours as needed for mild pain, moderate pain or headache.   BENAZEPRIL (LOTENSIN) 40 MG TABLET    TAKE 40 MG BY MOUTH DAILY   MELOXICAM (MOBIC) 15 MG TABLET    Take 1 tablet (15 mg total) by mouth daily as needed (knee pain).   MULTIPLE VITAMINS-MINERALS (MULTIVITAMIN ADULT PO)    Take 1 tablet by mouth daily.   NIFEDIPINE (PROCARDIA XL/ADALAT-CC) 60 MG 24 HR TABLET     Take 1 tablet (60 mg total) by mouth daily.    Review of Systems  Constitutional: Negative.   Respiratory: Negative.   Cardiovascular: Negative.   Musculoskeletal: Positive for arthralgias (left knee pain).    Social History  Substance Use Topics  . Smoking status: Current Every Day Smoker    Packs/day: 0.75    Types: Cigarettes  . Smokeless tobacco: Never Used  . Alcohol use 0.0 oz/week     Comment: MODERATE USE   Objective:   BP (!) 138/92 (BP Location: Right Arm, Patient Position: Sitting, Cuff Size: Normal)   Pulse 97   Temp 98.3 F (36.8 C) (Oral)   Wt 191 lb (86.6 kg)   SpO2 97%   BMI 25.20 kg/m   Physical Exam  Constitutional: He is oriented to person, place, and time. He appears well-developed and well-nourished. No distress.  HENT:  Head: Normocephalic and atraumatic.  Right Ear: Hearing normal.  Left Ear: Hearing normal.  Nose: Nose normal.  Eyes: Conjunctivae and lids are normal. Right eye exhibits no discharge. Left eye exhibits no discharge. No scleral icterus.  Pulmonary/Chest: Effort normal. No respiratory distress.  Musculoskeletal:  Tightness and crepitus limiting ROM of the left knee with history of arthroscopy for meniscal tear 2012 by Dr. Tamala Julian. Multiple huge varicose veins of the entire leg. Large amount of effusion.  Neurological: He is alert and oriented to person, place,  and time.  Skin: Skin is intact. No lesion and no rash noted.  Psychiatric: He has a normal mood and affect. His speech is normal and behavior is normal. Thought content normal.      Assessment & Plan:     1. Acute pain of left knee Onset over the past 4-5 days without any specific injury. Walk a lot at work. Swelling has been worsening and unable to flex much due to pain and tightness. Been wearing the knee brace he had from arthroscopy on the left knee in 2012 by Dr. Tamala Julian for a meniscal tear and severe degenerative disease. Will restart Meloxicam 15 mg qd and schedule  referral to orthopedist (patient requests Dr. Marry Guan). Has huge numerous varicose veins in both legs and many around knee. May be difficult to aspirate knee due to these veins. Recommend he be out of work the next 5 days. - Ambulatory referral to Orthopedic Surgery - meloxicam (MOBIC) 15 MG tablet; Take 1 tablet (15 mg total) by mouth daily as needed (knee pain).  Dispense: 30 tablet; Refill: 6  2. Osteoarthritis of both knees, unspecified osteoarthritis type There are considerable changes of osteoarthritis involving the medial  joint compartment. There is complete loss of the articular cartilage with  irregularity of the subarticular cortex of the medial femoral condyle on MRI on 07-31-10. Suspect worsening reactive edema. Needs aspiration and possible repeat MRI.   3. Leg varices Huge varicose veins both legs. Many large veins around knees making attempt at aspiration difficult. Referred to orthopedist.

## 2016-11-16 DIAGNOSIS — M1712 Unilateral primary osteoarthritis, left knee: Secondary | ICD-10-CM | POA: Diagnosis not present

## 2016-11-22 DIAGNOSIS — M1712 Unilateral primary osteoarthritis, left knee: Secondary | ICD-10-CM | POA: Diagnosis not present

## 2016-11-23 ENCOUNTER — Encounter (INDEPENDENT_AMBULATORY_CARE_PROVIDER_SITE_OTHER): Payer: Self-pay | Admitting: Vascular Surgery

## 2016-11-23 ENCOUNTER — Ambulatory Visit (INDEPENDENT_AMBULATORY_CARE_PROVIDER_SITE_OTHER): Payer: 59 | Admitting: Vascular Surgery

## 2016-11-23 VITALS — BP 163/97 | HR 78 | Resp 17 | Ht 72.0 in | Wt 186.0 lb

## 2016-11-23 DIAGNOSIS — M7989 Other specified soft tissue disorders: Secondary | ICD-10-CM

## 2016-11-23 DIAGNOSIS — I10 Essential (primary) hypertension: Secondary | ICD-10-CM

## 2016-11-23 DIAGNOSIS — I83813 Varicose veins of bilateral lower extremities with pain: Secondary | ICD-10-CM | POA: Diagnosis not present

## 2016-11-23 NOTE — Progress Notes (Signed)
Patient ID: Henry Hill, male   DOB: 01-04-1961, 56 y.o.   MRN: [id number]  Chief Complaint  Patient presents with  . Varicose Veins    Follow up before knee surgery    HPI Henry Hill is a 56 y.o. male.  Patient present for evaluation of venous disease.  He was last seen about 7-8 years ago and had laser ablation done bilaterally at that time. He has had more prominent varicosities begin to develop over the past several years particularly on the left leg. This has been associated with some swelling and some skin discoloration. He has tolerated this reasonably well and has gained some benefit from compression stockings and elevation. He has not had a DVT or superficial thrombophlebitis to his knowledge. Complicating the issue currently is the fact that his left knee and now needs replacement and he has very prominent varicosities around the left knee. His orthopedic surgeon who told him he needed to get his veins taken care of before they can consider proceeding with a knee replacement.  Current Outpatient Prescriptions  Medication Sig Dispense Refill  . acetaminophen (TYLENOL) 500 MG tablet Take 1,000 mg by mouth every 6 (six) hours as needed for mild pain, moderate pain or headache.    . benazepril (LOTENSIN) 40 MG tablet TAKE 40 MG BY MOUTH DAILY 30 tablet 6  . glucosamine-chondroitin 500-400 MG tablet Take 1 tablet by mouth 3 (three) times daily.    . meloxicam (MOBIC) 15 MG tablet Take 1 tablet (15 mg total) by mouth daily as needed (knee pain). 30 tablet 6  . Multiple Vitamins-Minerals (MULTIVITAMIN ADULT PO) Take 1 tablet by mouth daily.    Marland Kitchen NIFEdipine (PROCARDIA XL/ADALAT-CC) 60 MG 24 hr tablet Take 1 tablet (60 mg total) by mouth daily. (Patient not taking: Reported on 11/23/2016) 30 tablet 6   No current facility-administered medications for this visit.      Past Medical History:  Diagnosis Date  . Essential hypertension   . Varicose veins of both lower extremities      Past Surgical History:  Procedure Laterality Date  . BACK SURGERY  2003  . HEMORRHOID SURGERY N/A 11/13/2014   Procedure: LEFT LATERAL HEMORRHOIDECTOMY  ;  Surgeon: [name], MD;  Location: [facility];  Service: General;  Laterality: N/A;    Family History  Problem Relation Age of Onset  . Heart attack Mother   . Diabetes Father   . Hypertension Father   No bleeding or clotting disorders  Social History Social History  Substance Use Topics  . Smoking status: Current Every Day Smoker    Packs/day: 0.75    Types: Cigarettes  . Smokeless tobacco: Never Used  . Alcohol use 0.0 oz/week     Comment: MODERATE USE  No IVDU  No Known Allergies      REVIEW OF SYSTEMS (Negative unless checked)  Constitutional: [] Weight loss  [] Fever  [] Chills Cardiac: [] Chest pain   [] Chest pressure   [] Palpitations   [] Shortness of breath when laying flat   [] Shortness of breath at rest   [] Shortness of breath with exertion. Vascular:  [x] Pain in legs with walking   [x] Pain in legs at rest   [] Pain in legs when laying flat   [] Claudication   [] Pain in feet when walking  [] Pain in feet at rest  [] Pain in feet when laying flat   [] History of DVT   [] Phlebitis   [x] Swelling in legs   [x] Varicose veins   [] Non-healing ulcers  Pulmonary:   [] Uses home oxygen   [] Productive cough   [] Hemoptysis   [] Wheeze  [] COPD   [] Asthma Neurologic:  [] Dizziness  [] Blackouts   [] Seizures   [] History of stroke   [] History of TIA  [] Aphasia   [] Temporary blindness   [] Dysphagia   [] Weakness or numbness in arms   [] Weakness or numbness in legs Musculoskeletal:  [] Arthritis   [] Joint swelling   [] Joint pain   [] Low back pain Hematologic:  [] Easy bruising  [] Easy bleeding   [] Hypercoagulable state   [] Anemic  [] Hepatitis  Gastrointestinal:  [] Blood in stool   [] Vomiting blood  [] Gastroesophageal reflux/heartburn   [] Difficulty swallowing   Genitourinary:  [] Chronic kidney disease   [] Difficult urination  [] Frequent  urination  [] Burning with urination   [] Blood in urine Skin:  [] Rashes   [] Ulcers   [] Wounds Psychological:  [] History of anxiety   []  History of major depression.  Physical Exam BP (!) 163/97 (BP Location: Right Arm)   Pulse 78   Resp 17   Ht 6' (1.829 m)   Wt 186 lb (84.4 kg)   BMI 25.23 kg/m   Gen:  WD/WN, NAD Head: [place]/AT, No temporalis wasting.  Ear/Nose/Throat: Hearing grossly intact, nares w/o erythema or drainage, oropharynx w/o Erythema/Exudate Eyes: Sclera non-icteric, conjunctiva clear Neck: Trachea midline.  No JVD.  Pulmonary:  Good air movement, no use of accessory muscles.  Cardiac: RRR, normal S1, S2. Vascular:  Vessel Right Left  Radial Palpable Palpable                          PT 1+ Palpable 1+ Palpable  DP Palpable Palpable   Gastrointestinal: soft, non-tender/non-distended.  Musculoskeletal: M/S 5/5 throughout.  Extremities without ischemic changes.  No deformity or atrophy. Trace right lower extremity edema, 1+ left lower extremity edema. Varicosities are extensive and very large measuring almost a centimeter throughout the left lower leg and greater than 5 mm in the right leg.  Neurologic:  Sensation grossly intact in extremities.  Symmetrical.  Speech is fluent. Motor exam as listed above. Psychiatric: Judgment intact, Mood & affect appropriate for pt's clinical situation. Dermatologic: No rashes or ulcers noted.  No cellulitis or open wounds.     Radiology No results found.  Labs No results found for this or any previous visit (from the past 2160 hour(s)).  Assessment/Plan:  Essential (primary) hypertension blood pressure control important in reducing the progression of atherosclerotic disease. On appropriate oral medications.   Swelling of limb Likely from his venous disease although arthritis could be contributing  Varicose veins of leg with pain, bilateral The patient has an extensive history of venous disease and has already  undergone laser ablation of both great saphenous vein many years ago. His varicosities are extensive, and this would certainly complicate any upcoming major lower extremity surgery particularly in the replacement or the risk of thrombotic problems is high as well as the risk of bleeding with surgery. I would agree that his venous disease should be cared for and addressed prior to his knee replacement. He has failed conservative therapy as well as previous interventions. A venous duplex will be performed at his convenience for further evaluation of his disease. Clearly he will need some sort of intervention to his massive varicosities prior surgery, and foam sclerotherapy would likely be required. He may also need a another laser procedure if other major veins are involved in his disease process. I will see him back following his  study to discuss the results and determine other treatment options.     Leotis Pain 11/23/2016, 12:13 PM   This note was created with [facility] medical dictation system.  Any errors from dictation are unintentional.

## 2016-11-23 NOTE — Assessment & Plan Note (Signed)
blood pressure control important in reducing the progression of atherosclerotic disease. On appropriate oral medications.  

## 2016-11-23 NOTE — Patient Instructions (Signed)
Varicose Veins Varicose veins are veins that have become enlarged and twisted. They are usually seen in the legs but can occur in other parts of the body as well. What are the causes? This condition is the result of valves in the veins not working properly. Valves in the veins help to return blood from the leg to the heart. If these valves are damaged, blood flows backward and backs up into the veins in the leg near the skin. This causes the veins to become larger. What increases the risk? People who are on their feet a lot, who are pregnant, or who are overweight are more likely to develop varicose veins. What are the signs or symptoms?  Bulging, twisted-appearing, bluish veins, most commonly found on the legs.  Leg pain or a feeling of heaviness. These symptoms may be worse at the end of the day.  Leg swelling.  Changes in skin color. How is this diagnosed? A health care provider can usually diagnose varicose veins by examining your legs. Your health care provider may also recommend an ultrasound of your leg veins. How is this treated? Most varicose veins can be treated at home.However, other treatments are available for people who have persistent symptoms or want to improve the cosmetic appearance of the varicose veins. These treatment options include:  Sclerotherapy. A solution is injected into the vein to close it off.  Laser treatment. A laser is used to heat the vein to close it off.  Radiofrequency vein ablation. An electrical current produced by radio waves is used to close off the vein.  Phlebectomy. The vein is surgically removed through small incisions made over the varicose vein.  Vein ligation and stripping. The vein is surgically removed through incisions made over the varicose vein after the vein has been tied (ligated). Follow these instructions at home:   Do not stand or sit in one position for long periods of time. Do not sit with your legs crossed. Rest with your  legs raised during the day.  Wear compression stockings as directed by your health care provider. These stockings help to prevent blood clots and reduce swelling in your legs.  Do not wear other tight, encircling garments around your legs, pelvis, or waist.  Walk as much as possible to increase blood flow.  Raise the foot of your bed at night with 2-inch blocks.  If you get a cut in the skin over the vein and the vein bleeds, lie down with your leg raised and press on it with a clean cloth until the bleeding stops. Then place a bandage (dressing) on the cut. See your health care provider if it continues to bleed. Contact a health care provider if:  The skin around your ankle starts to break down.  You have pain, redness, tenderness, or hard swelling in your leg over a vein.  You are uncomfortable because of leg pain. This information is not intended to replace advice given to you by your health care provider. Make sure you discuss any questions you have with your health care provider. Document Released: 02/03/2005 Document Revised: 10/02/2015 Document Reviewed: 10/28/2015 Elsevier Interactive Patient Education  2017 [employer]  

## 2016-11-23 NOTE — Assessment & Plan Note (Signed)
Likely from his venous disease although arthritis could be contributing

## 2016-11-23 NOTE — Assessment & Plan Note (Signed)
The patient has an extensive history of venous disease and has already undergone laser ablation of both great saphenous vein many years ago. His varicosities are extensive, and this would certainly complicate any upcoming major lower extremity surgery particularly in the replacement or the risk of thrombotic problems is high as well as the risk of bleeding with surgery. I would agree that his venous disease should be cared for and addressed prior to his knee replacement. He has failed conservative therapy as well as previous interventions. A venous duplex will be performed at his convenience for further evaluation of his disease. Clearly he will need some sort of intervention to his massive varicosities prior surgery, and foam sclerotherapy would likely be required. He may also need a another laser procedure if other major veins are involved in his disease process. I will see him back following his study to discuss the results and determine other treatment options.

## 2016-11-24 ENCOUNTER — Ambulatory Visit (INDEPENDENT_AMBULATORY_CARE_PROVIDER_SITE_OTHER): Payer: 59

## 2016-11-24 DIAGNOSIS — I83813 Varicose veins of bilateral lower extremities with pain: Secondary | ICD-10-CM | POA: Diagnosis not present

## 2016-12-07 ENCOUNTER — Encounter (INDEPENDENT_AMBULATORY_CARE_PROVIDER_SITE_OTHER): Payer: Self-pay

## 2016-12-07 ENCOUNTER — Ambulatory Visit (INDEPENDENT_AMBULATORY_CARE_PROVIDER_SITE_OTHER): Payer: 59 | Admitting: Vascular Surgery

## 2016-12-07 ENCOUNTER — Encounter (INDEPENDENT_AMBULATORY_CARE_PROVIDER_SITE_OTHER): Payer: Self-pay | Admitting: Vascular Surgery

## 2016-12-07 VITALS — BP 147/89 | HR 89 | Resp 16 | Ht 72.0 in | Wt 189.0 lb

## 2016-12-07 DIAGNOSIS — M7989 Other specified soft tissue disorders: Secondary | ICD-10-CM

## 2016-12-07 DIAGNOSIS — I83813 Varicose veins of bilateral lower extremities with pain: Secondary | ICD-10-CM

## 2016-12-07 DIAGNOSIS — I10 Essential (primary) hypertension: Secondary | ICD-10-CM

## 2016-12-07 NOTE — Assessment & Plan Note (Signed)
He had significant superficial thrombophlebitis that was acute to subacute in the dilated left great saphenous vein from the mid thigh to the calf. 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 No DVT was seen. At this point, for his symptomatic varicose veins laser ablation should be considered. On the left, I would give this several more weeks to allow the superficial thrombophlebitis to resolve. On the right this can be done any time. I would perform this before his upcoming knee surgery. We will contact the patient regarding options. A message was left today on his phone and we will await his return call.

## 2016-12-07 NOTE — Progress Notes (Signed)
MRN : [id number]  Henry Hill is a 56 y.o. ([birth year]) male who presents with chief complaint of  Chief Complaint  Patient presents with  . Re-evaluation    U/S follow up from 7/18  .  History of Present Illness: Patient returns today in follow up of varicose veins.  His study was done previously, and We had difficulty locating this when he was here today so he went home and we have called him with results. He had significant superficial thrombophlebitis that was acute to subacute in the dilated left great saphenous vein from the mid thigh to the calf. 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 No DVT was seen.         Current Outpatient Prescriptions  Medication Sig Dispense Refill  . acetaminophen (TYLENOL) 500 MG tablet Take 1,000 mg by mouth every 6 (six) hours as needed for mild pain, moderate pain or headache.    . benazepril (LOTENSIN) 40 MG tablet TAKE 40 MG BY MOUTH DAILY 30 tablet 6  . glucosamine-chondroitin 500-400 MG tablet Take 1 tablet by mouth 3 (three) times daily.    . meloxicam (MOBIC) 15 MG tablet Take 1 tablet (15 mg total) by mouth daily as needed (knee pain). 30 tablet 6  . Multiple Vitamins-Minerals (MULTIVITAMIN ADULT PO) Take 1 tablet by mouth daily.    Marland Kitchen NIFEdipine (PROCARDIA XL/ADALAT-CC) 60 MG 24 hr tablet Take 1 tablet (60 mg total) by mouth daily. (Patient not taking: Reported on 11/23/2016) 30 tablet 6   No current facility-administered medications for this visit.          Past Medical History:  Diagnosis Date  . Essential hypertension   . Varicose veins of both lower extremities          Past Surgical History:  Procedure Laterality Date  . BACK SURGERY  2003  . HEMORRHOID SURGERY N/A 11/13/2014   Procedure: LEFT LATERAL  HEMORRHOIDECTOMY  ;  Surgeon: [name], MD;  Location: [facility];  Service: General;  Laterality: N/A;         Family History  Problem Relation Age of Onset  . Heart attack Mother   . Diabetes Father   . Hypertension Father   No bleeding or clotting disorders  Social History        Social History  Substance Use Topics  . Smoking status: Current Every Day Smoker    Packs/day: 0.75    Types: Cigarettes  . Smokeless tobacco: Never Used  . Alcohol use 0.0 oz/week      Comment: MODERATE USE  No IVDU  No Known Allergies            Labs No results found for this or any previous visit (from the past 2160 hour(s)).  Radiology No results found.   Assessment/Plan Essential (primary) hypertension blood pressure control important in reducing the progression of atherosclerotic disease. On appropriate oral medications.   Swelling of limb Likely from his venous disease although arthritis could be contributing  Varicose veins of leg with pain, bilateral He had significant superficial thrombophlebitis that was acute to subacute in the dilated left great saphenous vein from the mid thigh to the calf. The vein had reflux throughout the left lower extremity in the great saphenous vein in the right great  saphenous vein in the thigh up to the saphenofemoral junction demonstrated reflux with an old occlusive thrombus at the knee level on the right. No DVT was seen. At this point, for his symptomatic varicose veins laser ablation should be considered. On the left, I would give this several more weeks to allow the superficial thrombophlebitis to resolve. On the right this can be done any time. I would perform this before his upcoming knee surgery. We will contact the patient regarding options. A message was left today on his phone and we will await his return call.    Leotis Pain, MD  12/07/2016 5:29 PM    This note was created with Dragon medical  transcription system.  Any errors from dictation are purely unintentional

## 2016-12-09 ENCOUNTER — Other Ambulatory Visit: Payer: Self-pay | Admitting: Family Medicine

## 2016-12-09 DIAGNOSIS — I10 Essential (primary) hypertension: Secondary | ICD-10-CM

## 2016-12-09 NOTE — Telephone Encounter (Signed)
LOV 11/11/2016. Last refill 10/14/2015.

## 2016-12-09 NOTE — Telephone Encounter (Signed)
[facility] pharmacy faxed a request on the following medication. Thanks CC  NIFEdipine (PROCARDIA XL/ADALAT-CC) 60 MG 24 hr tablet  Take 1 tablet every day.

## 2016-12-10 MED ORDER — NIFEDIPINE ER OSMOTIC RELEASE 60 MG PO TB24: 60.0000 mg | ORAL_TABLET | Freq: Every day | ORAL | 6 refills | Status: DC

## 2016-12-22 ENCOUNTER — Ambulatory Visit (INDEPENDENT_AMBULATORY_CARE_PROVIDER_SITE_OTHER): Payer: 59 | Admitting: Vascular Surgery

## 2016-12-22 ENCOUNTER — Encounter (INDEPENDENT_AMBULATORY_CARE_PROVIDER_SITE_OTHER): Payer: Self-pay | Admitting: Vascular Surgery

## 2016-12-22 VITALS — BP 123/79 | HR 90 | Resp 15 | Ht 72.0 in | Wt 185.0 lb

## 2016-12-22 DIAGNOSIS — I83813 Varicose veins of bilateral lower extremities with pain: Secondary | ICD-10-CM | POA: Diagnosis not present

## 2016-12-22 NOTE — Progress Notes (Signed)
  Henry Hill is a 56 y.o.male who presents with painful varicose veins of the left leg  Past Medical History:  Diagnosis Date  . Essential hypertension   . Varicose veins of both lower extremities     Past Surgical History:  Procedure Laterality Date  . BACK SURGERY  2003  . HEMORRHOID SURGERY N/A 11/13/2014   Procedure: LEFT LATERAL HEMORRHOIDECTOMY  ;  Surgeon: [name], MD;  Location: [facility];  Service: General;  Laterality: N/A;    Current Outpatient Prescriptions  Medication Sig Dispense Refill  . acetaminophen (TYLENOL) 500 MG tablet Take 1,000 mg by mouth every 6 (six) hours as needed for mild pain, moderate pain or headache.    . benazepril (LOTENSIN) 40 MG tablet TAKE 40 MG BY MOUTH DAILY 30 tablet 6  . glucosamine-chondroitin 500-400 MG tablet Take 1 tablet by mouth 3 (three) times daily.    . meloxicam (MOBIC) 15 MG tablet Take 1 tablet (15 mg total) by mouth daily as needed (knee pain). 30 tablet 6  . Multiple Vitamins-Minerals (MULTIVITAMIN ADULT PO) Take 1 tablet by mouth daily.    Marland Kitchen NIFEdipine (PROCARDIA XL/ADALAT-CC) 60 MG 24 hr tablet Take 1 tablet (60 mg total) by mouth daily. 30 tablet 6   No current facility-administered medications for this visit.     No Known Allergies  Indication: Patient presents with symptomatic varicose veins of the left lower extremity.  Procedure: originally the patient was prepared for a laser ablation of the left great saphenous vein. On interrogation of the great saphenous vein and this had superficial thrombophlebitis and did not have significant flow. Attempts at needle access were unable to pass a wire proximally so the consideration for laser ablation was quickly abandoned and I proceeded with foam sclerotherapy of the massive varicosities around his calf and knee area. Foam sclerotherapy was performed on the left lower extremity. Using ultrasound guidance, 5 mL of foam Sotradecol was used to inject the varicosities of the left  lower extremity. Compression wraps were placed. The patient tolerated the procedure well.

## 2016-12-23 DIAGNOSIS — M1712 Unilateral primary osteoarthritis, left knee: Secondary | ICD-10-CM | POA: Diagnosis not present

## 2016-12-27 ENCOUNTER — Other Ambulatory Visit: Payer: Self-pay | Admitting: Orthopedic Surgery

## 2016-12-27 ENCOUNTER — Encounter (INDEPENDENT_AMBULATORY_CARE_PROVIDER_SITE_OTHER): Payer: 59

## 2016-12-27 DIAGNOSIS — M1712 Unilateral primary osteoarthritis, left knee: Secondary | ICD-10-CM

## 2016-12-28 ENCOUNTER — Ambulatory Visit
Admission: RE | Admit: 2016-12-28 | Discharge: 2016-12-28 | Disposition: A | Discharge status: Home / Self Care | Payer: 59 | Source: Ambulatory Visit | Attending: Orthopedic Surgery | Admitting: Orthopedic Surgery

## 2016-12-28 DIAGNOSIS — Z96652 Presence of left artificial knee joint: Secondary | ICD-10-CM | POA: Diagnosis not present

## 2016-12-28 DIAGNOSIS — M1712 Unilateral primary osteoarthritis, left knee: Secondary | ICD-10-CM | POA: Diagnosis not present

## 2016-12-28 DIAGNOSIS — M25462 Effusion, left knee: Secondary | ICD-10-CM | POA: Insufficient documentation

## 2016-12-28 DIAGNOSIS — M2342 Loose body in knee, left knee: Secondary | ICD-10-CM | POA: Insufficient documentation

## 2016-12-28 DIAGNOSIS — Z471 Aftercare following joint replacement surgery: Secondary | ICD-10-CM | POA: Diagnosis not present

## 2017-01-04 ENCOUNTER — Ambulatory Visit (INDEPENDENT_AMBULATORY_CARE_PROVIDER_SITE_OTHER): Payer: 59 | Admitting: Family Medicine

## 2017-01-04 ENCOUNTER — Encounter: Payer: Self-pay | Admitting: Family Medicine

## 2017-01-04 VITALS — BP 124/78 | HR 83 | Temp 97.7°F | Wt 191.2 lb

## 2017-01-04 DIAGNOSIS — Z01818 Encounter for other preprocedural examination: Secondary | ICD-10-CM

## 2017-01-04 DIAGNOSIS — M170 Bilateral primary osteoarthritis of knee: Secondary | ICD-10-CM | POA: Diagnosis not present

## 2017-01-04 DIAGNOSIS — I10 Essential (primary) hypertension: Secondary | ICD-10-CM | POA: Diagnosis not present

## 2017-01-04 DIAGNOSIS — I83813 Varicose veins of bilateral lower extremities with pain: Secondary | ICD-10-CM | POA: Diagnosis not present

## 2017-01-04 LAB — POCT URINALYSIS DIPSTICK
Bilirubin, UA: NEGATIVE
Blood, UA: NEGATIVE
Glucose, UA: NEGATIVE
Ketones, UA: NEGATIVE
Leukocytes, UA: NEGATIVE
Nitrite, UA: NEGATIVE
Protein, UA: NEGATIVE
Spec Grav, UA: 1.01 (ref 1.010–1.025)
Urobilinogen, UA: 0.2 E.U./dL
pH, UA: 7 (ref 5.0–8.0)

## 2017-01-04 NOTE — Progress Notes (Signed)
Patient: Henry Hill Male    DOB: Apr 28, 1961   56 y.o.   MRN: [id number] Visit Date: 01/04/2017  Today's Provider: Vernie Murders, PA   Chief Complaint  Patient presents with  . Surgical Clearance   Subjective:    HPI Patient is here for surgical clearance. He is having left total knee replacement surgery on 01/12/2017 by Dr. Kurtis Bushman. No concerns about the surgery. He will be doing physical therapy at [facility]. He will be released to go home approximately 1-2 days after the surgery. He does smoke and drinks alcohol. No cardiac or neurologic symptoms at all.  Patient Active Problem List   Diagnosis Date Noted  . Swelling of limb 11/23/2016  . Internal and external thrombosed hemorrhoids 11/13/2014  . Anal pain 11/08/2014  . Osteoarthritis of both knees 11/01/2014  . Failure of erection 11/01/2014  . Varicose veins of leg with pain, bilateral 11/01/2014  . Hypo-osmolality and hyponatremia 10/14/2009  . Avitaminosis D 10/14/2009  . Decreased libido 09/11/2009  . Malaise and fatigue 09/11/2009  . Phobia 05/16/2007  . Essential (primary) hypertension 02/04/2006   Past Surgical History:  Procedure Laterality Date  . BACK SURGERY  2003  . HEMORRHOID SURGERY N/A 11/13/2014   Procedure: LEFT LATERAL HEMORRHOIDECTOMY  ;  Surgeon: [name], MD;  Location: [facility];  Service: General;  Laterality: N/A;   Family History  Problem Relation Age of Onset  . Heart attack Mother   . Diabetes Father   . Hypertension Father    No Known Allergies   Previous Medications   ACETAMINOPHEN (TYLENOL) 500 MG TABLET    Take 1,000 mg by mouth every 6 (six) hours as needed for mild pain, moderate pain or headache.   BENAZEPRIL (LOTENSIN) 40 MG TABLET    TAKE 40 MG BY MOUTH DAILY   GLUCOSAMINE-CHONDROITIN 500-400 MG TABLET    Take 1 tablet by mouth 3 (three) times daily.   MELOXICAM (MOBIC) 15 MG TABLET    Take 1 tablet (15 mg total) by mouth daily as needed (knee pain).   MULTIPLE  VITAMINS-MINERALS (MULTIVITAMIN ADULT PO)    Take 1 tablet by mouth daily.   NIFEDIPINE (PROCARDIA XL/ADALAT-CC) 60 MG 24 HR TABLET    Take 1 tablet (60 mg total) by mouth daily.    Review of Systems  Constitutional: Negative.   Respiratory: Negative.   Cardiovascular: Negative.   Musculoskeletal:       Left knee pain     Social History  Substance Use Topics  . Smoking status: Current Every Day Smoker    Packs/day: 0.75    Types: Cigarettes  . Smokeless tobacco: Never Used  . Alcohol use 0.0 oz/week     Comment: MODERATE USE   Objective:   BP 124/78 (BP Location: Right Arm, Patient Position: Sitting, Cuff Size: Normal)   Pulse 83   Temp 97.7 F (36.5 C) (Oral)   Wt 191 lb 3.2 oz (86.7 kg)   SpO2 98%   BMI 25.93 kg/m   Physical Exam  Constitutional: He is oriented to person, place, and time. He appears well-developed and well-nourished.  HENT:  Head: Normocephalic.  Right Ear: External ear normal.  Left Ear: External ear normal.  Nose: Nose normal.  Eyes: Conjunctivae are normal.  Neck: Neck supple.  Cardiovascular: Normal rate and regular rhythm.   Pulmonary/Chest: Effort normal and breath sounds normal.  Abdominal: Soft. Bowel sounds are normal.  Musculoskeletal:  Very large varicose veins both legs from thigh  to calves. Soreness on the left medial leg where sclerosing procedure was done on the veins. Left knee in a brace and sharp pains with certain movements.  Neurological: He is alert and oriented to person, place, and time.  Skin: No rash noted.  Psychiatric: He has a normal mood and affect. His behavior is normal. Thought content normal.      Assessment & Plan:     1. Pre-operative clearance Stable vitals and getting labs. EKG essentially without acute changes. Awaiting final lab reports. General exam does not show any deterrent for medical clearance for left knee joint replacement. Recheck prn. - EKG 12-Lead - POCT Urinalysis Dipstick - HgB U1L - Basic  Metabolic Panel (BMET) - CBC With Differential - Albumin - HIV antibody - Hepatitis C Antibody  2. Osteoarthritis of both knees, unspecified osteoarthritis type Bone-on-bone articulation with osteoarthritis. Wearing a hinged brace and continues to have pain daily. Proceed with joint replacement by Dr. Marica Otter (orthopedist).  3. Essential (primary) hypertension Well controlled with use of Nifedipine 60 mg qd. - Basic Metabolic Panel (BMET) - CBC With Differential  4. Varicose veins of leg with pain, bilateral Dr. Lucky Cowboy injected veins around the left knee with sclerosant on 12-22-16. Some soreness remains but no peripheral edema today.

## 2017-01-05 DIAGNOSIS — I10 Essential (primary) hypertension: Secondary | ICD-10-CM | POA: Diagnosis not present

## 2017-01-05 DIAGNOSIS — Z01818 Encounter for other preprocedural examination: Secondary | ICD-10-CM | POA: Diagnosis not present

## 2017-01-06 ENCOUNTER — Telehealth: Payer: Self-pay

## 2017-01-06 DIAGNOSIS — E871 Hypo-osmolality and hyponatremia: Secondary | ICD-10-CM

## 2017-01-06 LAB — BASIC METABOLIC PANEL
BUN/Creatinine Ratio: 13 (ref 9–20)
BUN: 8 mg/dL (ref 6–24)
CO2: 21 mmol/L (ref 20–29)
Calcium: 9.3 mg/dL (ref 8.7–10.2)
Chloride: 92 mmol/L — ABNORMAL LOW (ref 96–106)
Creatinine, Ser: 0.6 mg/dL — ABNORMAL LOW (ref 0.76–1.27)
GFR calc Af Amer: 130 mL/min/{1.73_m2} (ref >59)
GFR calc non Af Amer: 112 mL/min/{1.73_m2} (ref >59)
Glucose: 104 mg/dL — ABNORMAL HIGH (ref 65–99)
Potassium: 4.6 mmol/L (ref 3.5–5.2)
Sodium: 127 mmol/L — ABNORMAL LOW (ref 134–144)

## 2017-01-06 LAB — CBC WITH DIFFERENTIAL
Basophils Absolute: 0.1 10*3/uL (ref 0.0–0.2)
Basos: 1 %
EOS (ABSOLUTE): 0.4 10*3/uL (ref 0.0–0.4)
Eos: 6 %
Hematocrit: 41.5 % (ref 37.5–51.0)
Hemoglobin: 14.8 g/dL (ref 13.0–17.7)
Immature Grans (Abs): 0 10*3/uL (ref 0.0–0.1)
Immature Granulocytes: 0 %
Lymphocytes Absolute: 1.9 10*3/uL (ref 0.7–3.1)
Lymphs: 25 %
MCH: 32.7 pg (ref 26.6–33.0)
MCHC: 35.7 g/dL (ref 31.5–35.7)
MCV: 92 fL (ref 79–97)
Monocytes Absolute: 0.8 10*3/uL (ref 0.1–0.9)
Monocytes: 11 %
Neutrophils Absolute: 4.5 10*3/uL (ref 1.4–7.0)
Neutrophils: 57 %
RBC: 4.52 x10E6/uL (ref 4.14–5.80)
RDW: 13.6 % (ref 12.3–15.4)
WBC: 7.7 10*3/uL (ref 3.4–10.8)

## 2017-01-06 LAB — HEPATITIS C ANTIBODY: Hep C Virus Ab: 0.1 s/co ratio (ref 0.0–0.9)

## 2017-01-06 LAB — HEMOGLOBIN A1C
Est. average glucose Bld gHb Est-mCnc: 105 mg/dL
Hgb A1c MFr Bld: 5.3 % (ref 4.8–5.6)

## 2017-01-06 LAB — HIV ANTIBODY (ROUTINE TESTING W REFLEX): HIV Screen 4th Generation wRfx: NONREACTIVE

## 2017-01-06 LAB — ALBUMIN: Albumin: 4.8 g/dL (ref 3.5–5.5)

## 2017-01-06 NOTE — Telephone Encounter (Signed)
-----   Message from [place], [place] sent at 01/06/2017  1:58 PM EDT ----- Sodium level very low. Limit water intake to 32 ounces in a day and may have a little salt in diet. Recheck level in 4 days or go to ER for IV correction before able to release for surgery. Send copy to Dr. Kurtis Bushman (orthopedic surgeon) to let him know left knee replacement on 01-12-17 may have to be rescheduled if this does not correct quickly.

## 2017-01-06 NOTE — Telephone Encounter (Signed)
Can write order on a Rx for BMP to follow up Hyponatremia at [facility] on Monday.

## 2017-01-06 NOTE — Telephone Encounter (Signed)
Labs ordered. Placed lab slip at front desk for pick up. Left patient a message advising him that slip will be at the front desk for pick up tomorrow so he can go to the hospital to have labs rechecked on Monday.

## 2017-01-06 NOTE — Telephone Encounter (Signed)
Pt is returning call.  [phone]

## 2017-01-06 NOTE — Telephone Encounter (Signed)
Patient advised. 4 days will be on Monday and the office will be closed. Should patient go to the lab at [facility] on Monday to have labs rechecked or can he have them done on Tuesday here at [facility]? Please advise.   Lab results faxed to Dr. Kurtis Bushman at [facility] [phone]

## 2017-01-06 NOTE — Telephone Encounter (Signed)
Patient advised of lab slip being ready for pick up. He states he is not scheduled to have surgery until 02/11/17. His pre clearance form stated he was having surgery on 01/12/17. He states he has over a month before surgery and wants to know if he still needs to pick up lab slip on Monday or if he can wait until Tuesday?   Advised patient that he may want to contact [facility] to make sure they have him scheduled for 02/11/17 and not 01/12/17 as written on surgical clearance form.   Patient will call back tomorrow to see what he needs to do.   Please advise.

## 2017-01-07 NOTE — Telephone Encounter (Signed)
Returned patient's call. Advised Henry Hill per Simona Huh he can have labs rechecked within the next 2 weeks since surgery is not scheduled until 02/11/17.

## 2017-01-07 NOTE — Telephone Encounter (Signed)
Pt is requesting a call back.  CB#[phone]

## 2017-01-07 NOTE — Telephone Encounter (Signed)
We probably should call [facility] because his sodium is very low and has to be corrected before surgery.

## 2017-01-07 NOTE — Telephone Encounter (Signed)
[facility] states surgery planned for 02-11-17. Should recheck sodium level in 2 weeks.

## 2017-01-07 NOTE — Telephone Encounter (Signed)
Contacted [facility] to confirm they received patient's lab results yesterday for Dr. Harlow Mares to review sodium level. Also confirmed patient's surgery is on 02/11/17.  Since surgery is on 10/5 when should patient have labs rechecked? Please advise.

## 2017-01-19 DIAGNOSIS — M1712 Unilateral primary osteoarthritis, left knee: Secondary | ICD-10-CM | POA: Diagnosis not present

## 2017-01-20 DIAGNOSIS — E871 Hypo-osmolality and hyponatremia: Secondary | ICD-10-CM | POA: Diagnosis not present

## 2017-01-21 LAB — BASIC METABOLIC PANEL
BUN/Creatinine Ratio: 15 (ref 9–20)
BUN: 9 mg/dL (ref 6–24)
CO2: 22 mmol/L (ref 20–29)
Calcium: 9.3 mg/dL (ref 8.7–10.2)
Chloride: 94 mmol/L — ABNORMAL LOW (ref 96–106)
Creatinine, Ser: 0.6 mg/dL — ABNORMAL LOW (ref 0.76–1.27)
GFR calc Af Amer: 130 mL/min/{1.73_m2} (ref >59)
GFR calc non Af Amer: 112 mL/min/{1.73_m2} (ref >59)
Glucose: 96 mg/dL (ref 65–99)
Potassium: 4.7 mmol/L (ref 3.5–5.2)
Sodium: 132 mmol/L — ABNORMAL LOW (ref 134–144)

## 2017-01-24 NOTE — Telephone Encounter (Signed)
Acknowledged and agree with plan.

## 2017-02-09 DIAGNOSIS — R269 Unspecified abnormalities of gait and mobility: Secondary | ICD-10-CM | POA: Diagnosis not present

## 2017-02-09 DIAGNOSIS — M25562 Pain in left knee: Secondary | ICD-10-CM | POA: Diagnosis not present

## 2017-02-09 DIAGNOSIS — M6281 Muscle weakness (generalized): Secondary | ICD-10-CM | POA: Diagnosis not present

## 2017-02-11 DIAGNOSIS — M1712 Unilateral primary osteoarthritis, left knee: Secondary | ICD-10-CM | POA: Diagnosis not present

## 2017-05-17 DIAGNOSIS — M25562 Pain in left knee: Secondary | ICD-10-CM | POA: Diagnosis not present

## 2017-06-16 ENCOUNTER — Ambulatory Visit: Payer: 59 | Admitting: Family Medicine

## 2017-06-16 ENCOUNTER — Encounter: Payer: Self-pay | Admitting: Family Medicine

## 2017-06-16 DIAGNOSIS — Z96652 Presence of left artificial knee joint: Secondary | ICD-10-CM | POA: Diagnosis not present

## 2017-06-16 DIAGNOSIS — I10 Essential (primary) hypertension: Secondary | ICD-10-CM

## 2017-06-16 MED ORDER — NIFEDIPINE ER OSMOTIC RELEASE 60 MG PO TB24: 60.0000 mg | ORAL_TABLET | Freq: Every day | ORAL | 3 refills | Status: DC

## 2017-06-16 NOTE — Progress Notes (Signed)
Patient: Henry Hill Male    DOB: Sep 21, 1960   57 y.o.   MRN: [id number] Visit Date: 06/16/2017  Today's Provider: Vernie Murders, PA   Chief Complaint  Patient presents with  . Hypertension  . Follow-up   Subjective:     HPI  Hypertension, follow-up:  BP Readings from Last 3 Encounters:  06/16/17 (!) 144/84  01/04/17 124/78  12/22/16 123/79    He was last seen for hypertension 5 months ago.  BP at that visit was 124/78. Management changes since that visit include continue medications. He reports good compliance with treatment. He is not having side effects.  He is exercising. He is not adherent to low salt diet.   Outside blood pressures are being checked. Elevated at times. He is experiencing none.  Patient denies chest pain, chest pressure/discomfort, claudication, dyspnea, exertional chest pressure/discomfort, fatigue, irregular heart beat, lower extremity edema, near-syncope, orthopnea, palpitations, paroxysmal nocturnal dyspnea, syncope and tachypnea.   Cardiovascular risk factors include advanced age (older than 64 for men, 62 for women) and smoking/ tobacco exposure.  Use of agents associated with hypertension: none.     Weight trend: stable Wt Readings from Last 3 Encounters:  06/16/17 194 lb 3.2 oz (88.1 kg)  01/04/17 191 lb 3.2 oz (86.7 kg)  12/22/16 185 lb (83.9 kg)    Current diet: high salt. Sodium levels were to low   ------------------------------------------------------------------------ Past Medical History:  Diagnosis Date  . Essential hypertension   . Varicose veins of both lower extremities    Past Surgical History:  Procedure Laterality Date  . BACK SURGERY  2003  . HEMORRHOID SURGERY N/A 11/13/2014   Procedure: LEFT LATERAL HEMORRHOIDECTOMY  ;  Surgeon: [name], MD;  Location: [facility];  Service: General;  Laterality: N/A;   Family History  Problem Relation Age of Onset  . Heart attack Mother   . Diabetes Father   .  Hypertension Father    No Known Allergies  Current Outpatient Medications:  .  acetaminophen (TYLENOL) 500 MG tablet, Take 1,000 mg by mouth every 6 (six) hours as needed for mild pain, moderate pain or headache., Disp: , Rfl:  .  glucosamine-chondroitin 500-400 MG tablet, Take 1 tablet by mouth 3 (three) times daily., Disp: , Rfl:  .  Multiple Vitamins-Minerals (MULTIVITAMIN ADULT PO), Take 1 tablet by mouth daily., Disp: , Rfl:  .  NIFEdipine (PROCARDIA XL/ADALAT-CC) 60 MG 24 hr tablet, Take 1 tablet (60 mg total) by mouth daily., Disp: 30 tablet, Rfl: 6 .  meloxicam (MOBIC) 15 MG tablet, Take 1 tablet (15 mg total) by mouth daily as needed (knee pain). (Patient not taking: Reported on 06/16/2017), Disp: 30 tablet, Rfl: 6  Review of Systems  Constitutional: Negative.   Respiratory: Negative.   Cardiovascular: Negative.     Social History   Tobacco Use  . Smoking status: Current Every Day Smoker    Packs/day: 0.75    Types: Cigarettes  . Smokeless tobacco: Never Used  Substance Use Topics  . Alcohol use: Yes    Alcohol/week: 0.0 oz    Comment: MODERATE USE   Objective:   BP (!) 144/84 (BP Location: Right Arm, Patient Position: Sitting, Cuff Size: Normal)   Pulse 83   Temp 98.1 F (36.7 C) (Oral)   Wt 194 lb 3.2 oz (88.1 kg)   SpO2 97%   BMI 26.34 kg/m    Physical Exam  Constitutional: He is oriented to person, place, and time. He  appears well-developed and well-nourished. No distress.  HENT:  Head: Normocephalic and atraumatic.  Right Ear: Hearing normal.  Left Ear: Hearing normal.  Nose: Nose normal.  Eyes: Conjunctivae and lids are normal. Right eye exhibits no discharge. Left eye exhibits no discharge. No scleral icterus.  Neck: Neck supple.  Cardiovascular: Normal rate and regular rhythm.  Pulmonary/Chest: Effort normal and breath sounds normal. No respiratory distress.  Abdominal: Soft. Bowel sounds are normal.  Musculoskeletal: Normal range of motion.  Good  ROM with well healed scar left anterior knee from joint replacement October 2018.  Neurological: He is alert and oriented to person, place, and time.  Skin: Skin is intact. No lesion and no rash noted.  Psychiatric: He has a normal mood and affect. His speech is normal and behavior is normal. Thought content normal.      Assessment & Plan:     1. Essential (primary) hypertension Still taking Procardia 60 mg qd without side effects. Has gained weight since he had the left knee replacement. Will get routine labs and refill medications. Recheck pending reports. - NIFEdipine (PROCARDIA XL/ADALAT-CC) 60 MG 24 hr tablet; Take 1 tablet (60 mg total) by mouth daily.  Dispense: 90 tablet; Refill: 3 - CBC with Differential/Platelet - Comprehensive metabolic panel  2. Hx of total knee replacement, left Well healed with full ROM and only a slight numbness below the patella remains. Surgery was 02-11-17 by Dr. Kurtis Bushman. Has gone back to full duties at work.       Vernie Murders, PA  [facility]

## 2018-02-14 DIAGNOSIS — M1712 Unilateral primary osteoarthritis, left knee: Secondary | ICD-10-CM | POA: Diagnosis not present

## 2018-02-14 DIAGNOSIS — M1711 Unilateral primary osteoarthritis, right knee: Secondary | ICD-10-CM | POA: Diagnosis not present

## 2018-03-20 ENCOUNTER — Other Ambulatory Visit: Payer: Self-pay | Admitting: Family Medicine

## 2018-03-20 DIAGNOSIS — I10 Essential (primary) hypertension: Secondary | ICD-10-CM

## 2018-03-20 MED ORDER — NIFEDIPINE ER OSMOTIC RELEASE 60 MG PO TB24: 60.0000 mg | ORAL_TABLET | Freq: Every day | ORAL | 3 refills | Status: DC

## 2018-03-20 NOTE — Telephone Encounter (Signed)
Pt needing 1 year refill of: NIFEdipine (PROCARDIA XL/ADALAT-CC) 60 MG 24 hr tablet  Please fill at: [facility] [place] [place] said to let him know if he has to come in to an appt to get refilled.  Thanks, [employer]

## 2018-03-20 NOTE — Telephone Encounter (Signed)
Please review

## 2019-03-22 ENCOUNTER — Other Ambulatory Visit: Payer: Self-pay | Admitting: Family Medicine

## 2019-03-22 DIAGNOSIS — I10 Essential (primary) hypertension: Secondary | ICD-10-CM

## 2019-03-22 MED ORDER — NIFEDIPINE ER OSMOTIC RELEASE 60 MG PO TB24: 60.0000 mg | ORAL_TABLET | Freq: Every day | ORAL | 0 refills | Status: DC

## 2019-03-22 NOTE — Telephone Encounter (Signed)
[facility] [place] faxed refill request for the following medications:  NIFEdipine (PROCARDIA XL/NIFEDICAL XL) 60 MG 24 hr tablet   Patient only has 8 left   Please advise.

## 2019-03-22 NOTE — Telephone Encounter (Signed)
Henry Hill, patient has not been seen since 2/19

## 2019-05-11 DIAGNOSIS — Z9049 Acquired absence of other specified parts of digestive tract: Secondary | ICD-10-CM

## 2019-05-11 HISTORY — DX: Acquired absence of other specified parts of digestive tract: Z90.49

## 2019-06-18 ENCOUNTER — Telehealth: Payer: Self-pay | Admitting: Family Medicine

## 2019-06-18 DIAGNOSIS — I10 Essential (primary) hypertension: Secondary | ICD-10-CM

## 2019-06-18 NOTE — Telephone Encounter (Signed)
RX REFILL NIFEdipine (PROCARDIA XL/NIFEDICAL XL) 60 MG 24 hr tablet [id number]  PHARM[facility]Y [facility] [place] [place] to [place] [place] Phone:  [phone]  Fax:  [phone]

## 2019-06-20 NOTE — Telephone Encounter (Signed)
Patient called to check status on Rx for NIFEdipine (PROCARDIA XL/NIFEDICAL XL) 60 MG 24 hr tablet. He was informed that he may need to be seen by dr since last appointment was in 2019. Per patient he just want his medication and is afraid to contact covid from the Dr office

## 2019-06-21 ENCOUNTER — Other Ambulatory Visit: Payer: Self-pay | Admitting: Family Medicine

## 2019-06-21 DIAGNOSIS — I10 Essential (primary) hypertension: Secondary | ICD-10-CM

## 2019-06-21 MED ORDER — NIFEDIPINE ER OSMOTIC RELEASE 60 MG PO TB24: 60.0000 mg | ORAL_TABLET | Freq: Every day | ORAL | 0 refills | Status: DC

## 2019-06-21 NOTE — Telephone Encounter (Signed)
Virtual visit made

## 2019-06-21 NOTE — Telephone Encounter (Signed)
Safe maintenance of this medication requires check of heart, lungs, BP and fasting labs. May schedule virtual visit if uncomfortable coming to the office.

## 2019-06-21 NOTE — Progress Notes (Signed)
Henry Hill  MRN: [id number] DOB: [birth year]  Subjective:   Virtual Visit via Telephone Note  I connected with Henry Hill on 06/22/19 at  8:40 AM EST by telephone and verified that I am speaking with the correct person using two identifiers.  Location: Patient: Home Provider: Office   I discussed the limitations, risks, security and privacy concerns of performing an evaluation and management service by telephone and the availability of in person appointments. I also discussed with the patient that there may be a patient responsible charge related to this service. The patient expressed understanding and agreed to proceed.  HPI The patient is a 59 year old male who presents via phone visit.  Patient unable to connect through video due to lack of equipment.  Patient declines coming in to the office due to his fear of Covid.   The patient has not been seen since 06/2017.  He reports he has been taking his Nifedipine and has had no adverse effects.  He checks his blood pressure on occasion and has been getting readings that are in the 150's over 90's.   No management changes were made at the last visit and the patient did not follow through and have his blood work done. BP 122/77 last evening and recently in this range.  Also, concerned about decrease in erectile function and libido. Requests check of testosterone level..   Patient Active Problem List   Diagnosis Date Noted  . Hx of total knee replacement, left 06/16/2017  . Swelling of limb 11/23/2016  . Internal and external thrombosed hemorrhoids 11/13/2014  . Anal pain 11/08/2014  . Osteoarthritis of knee 11/01/2014  . Failure of erection 11/01/2014  . Varicose veins of leg with pain, bilateral 11/01/2014  . Hypo-osmolality and hyponatremia 10/14/2009  . Avitaminosis D 10/14/2009  . Decreased libido 09/11/2009  . Malaise and fatigue 09/11/2009  . Phobia 05/16/2007  . Essential (primary) hypertension 02/04/2006    Past  Medical History:  Diagnosis Date  . Essential hypertension   . Varicose veins of both lower extremities     Social History   Socioeconomic History  . Marital status: Married    Spouse name: Not on file  . Number of children: Not on file  . Years of education: Not on file  . Highest education level: Not on file  Occupational History  . Not on file  Tobacco Use  . Smoking status: Current Every Day Smoker    Packs/day: 0.75    Types: Cigarettes  . Smokeless tobacco: Never Used  Substance and Sexual Activity  . Alcohol use: Yes    Alcohol/week: 0.0 standard drinks    Comment: MODERATE USE  . Drug use: No  . Sexual activity: Not on file  Other Topics Concern  . Not on file  Social History Narrative  . Not on file   Social Determinants of Health   Financial Resource Strain:   . Difficulty of Paying Living Expenses: Not on file  Food Insecurity:   . Worried About Charity fundraiser in the Last Year: Not on file  . Ran Out of Food in the Last Year: Not on file  Transportation Needs:   . Lack of Transportation (Medical): Not on file  . Lack of Transportation (Non-Medical): Not on file  Physical Activity:   . Days of Exercise per Week: Not on file  . Minutes of Exercise per Session: Not on file  Stress:   . Feeling of Stress :  Not on file  Social Connections:   . Frequency of Communication with Friends and Family: Not on file  . Frequency of Social Gatherings with Friends and Family: Not on file  . Attends Religious Services: Not on file  . Active Member of Clubs or Organizations: Not on file  . Attends Archivist Meetings: Not on file  . Marital Status: Not on file  Intimate Partner Violence:   . Fear of Current or Ex-Partner: Not on file  . Emotionally Abused: Not on file  . Physically Abused: Not on file  . Sexually Abused: Not on file    Outpatient Encounter Medications as of 06/22/2019  Medication Sig  . acetaminophen (TYLENOL) 500 MG tablet Take  1,000 mg by mouth every 6 (six) hours as needed for mild pain, moderate pain or headache.  . Multiple Vitamins-Minerals (MULTIVITAMIN ADULT PO) Take 1 tablet by mouth daily.  Marland Kitchen NIFEdipine (PROCARDIA XL/NIFEDICAL XL) 60 MG 24 hr tablet Take 1 tablet (60 mg total) by mouth daily.  . [DISCONTINUED] glucosamine-chondroitin 500-400 MG tablet Take 1 tablet by mouth 3 (three) times daily.  . [DISCONTINUED] meloxicam (MOBIC) 15 MG tablet Take 1 tablet (15 mg total) by mouth daily as needed (knee pain). (Patient not taking: Reported on 06/16/2017)   No facility-administered encounter medications on file as of 06/22/2019.    No Known Allergies  Review of Systems  Constitutional: Negative for chills, diaphoresis, fever and malaise/fatigue.  HENT: Negative for congestion and sore throat.   Respiratory: Negative for cough, hemoptysis, sputum production, shortness of breath and wheezing.   Cardiovascular: Negative for chest pain, palpitations, orthopnea, claudication and leg swelling.  Gastrointestinal: Negative for abdominal pain and diarrhea.  Genitourinary: Negative for frequency.  Musculoskeletal: Negative for myalgias.       Intermittent burning in calves after walking long distances. Resolves after resting. Long history of severe varicose veins in legs and sclerosing procedures.  Neurological: Negative for dizziness and headaches.  Endo/Heme/Allergies: Negative for polydipsia.    Objective:  BP 122/77 (BP Location: Left Arm, Patient Position: Sitting, Cuff Size: Normal)   Ht 6' (1.829 m)   Wt 205 lb (93 kg)   BMI 27.80 kg/m   Physical Exam: No apparent respiratory distress during telephonic interview.  Assessment and Plan :  1. Essential (primary) hypertension Tolerating Nifedipine 60 mg qd without palpitations, dyspnea or dizziness. BP in the 116/77 to 125/82 range at home. Will get routine follow up labs and recheck pending reports. - CBC with Differential/Platelet; Future - Comprehensive  metabolic panel; Future - Lipid panel; Future - TSH; Future  2. Decreased libido Recent month he has noticed decrease in libido and erectile function. Will check labs with testosterone level. - CBC with Differential/Platelet; Future - Comprehensive metabolic panel; Future - TSH; Future - Testosterone,Free and Total; Future  3. Intermittent claudication (HCC) Noticing intermittent burning in calves when he walks long distances. After sitting down to rest, it will resolve. Recommend ASA 81 mg qd and get routine labs. Should consider follow up with his vascular surgeon. - CBC with Differential/Platelet; Future - Comprehensive metabolic panel; Future - Lipid panel; Future     I discussed the assessment and treatment plan with the patient. The patient was provided an opportunity to ask questions and all were answered. The patient agreed with the plan and demonstrated an understanding of the instructions.   The patient was advised to call back or seek an in-person evaluation if the symptoms worsen or if the condition fails  to improve as anticipated.  I provided 18 minutes of non-face-to-face time during this encounter.

## 2019-06-22 ENCOUNTER — Encounter: Payer: Self-pay | Admitting: Family Medicine

## 2019-06-22 ENCOUNTER — Ambulatory Visit (INDEPENDENT_AMBULATORY_CARE_PROVIDER_SITE_OTHER): Payer: 59 | Admitting: Family Medicine

## 2019-06-22 VITALS — BP 122/77 | Ht 72.0 in | Wt 205.0 lb

## 2019-06-22 DIAGNOSIS — R6882 Decreased libido: Secondary | ICD-10-CM | POA: Diagnosis not present

## 2019-06-22 DIAGNOSIS — I739 Peripheral vascular disease, unspecified: Secondary | ICD-10-CM | POA: Diagnosis not present

## 2019-06-22 DIAGNOSIS — I10 Essential (primary) hypertension: Secondary | ICD-10-CM

## 2019-07-03 ENCOUNTER — Other Ambulatory Visit: Payer: Self-pay

## 2019-07-03 ENCOUNTER — Other Ambulatory Visit: Payer: Self-pay | Admitting: Family Medicine

## 2019-07-03 DIAGNOSIS — I10 Essential (primary) hypertension: Secondary | ICD-10-CM

## 2019-07-03 DIAGNOSIS — I739 Peripheral vascular disease, unspecified: Secondary | ICD-10-CM

## 2019-07-03 DIAGNOSIS — R6882 Decreased libido: Secondary | ICD-10-CM

## 2019-07-04 LAB — CBC WITH DIFFERENTIAL/PLATELET
Basophils Absolute: 0.1 10*3/uL (ref 0.0–0.2)
Basos: 1 %
EOS (ABSOLUTE): 1.4 10*3/uL — ABNORMAL HIGH (ref 0.0–0.4)
Eos: 17 %
Hematocrit: 42 % (ref 37.5–51.0)
Hemoglobin: 15 g/dL (ref 13.0–17.7)
Immature Grans (Abs): 0 10*3/uL (ref 0.0–0.1)
Immature Granulocytes: 0 %
Lymphocytes Absolute: 1.8 10*3/uL (ref 0.7–3.1)
Lymphs: 21 %
MCH: 34 pg — ABNORMAL HIGH (ref 26.6–33.0)
MCHC: 35.7 g/dL (ref 31.5–35.7)
MCV: 95 fL (ref 79–97)
Monocytes Absolute: 0.8 10*3/uL (ref 0.1–0.9)
Monocytes: 9 %
Neutrophils Absolute: 4.5 10*3/uL (ref 1.4–7.0)
Neutrophils: 52 %
Platelets: 329 10*3/uL (ref 150–450)
RBC: 4.41 x10E6/uL (ref 4.14–5.80)
RDW: 11.7 % (ref 11.6–15.4)
WBC: 8.5 10*3/uL (ref 3.4–10.8)

## 2019-07-04 LAB — COMPREHENSIVE METABOLIC PANEL
ALT: 35 IU/L (ref 0–44)
AST: 40 IU/L (ref 0–40)
Albumin/Globulin Ratio: 2 (ref 1.2–2.2)
Albumin: 4.5 g/dL (ref 3.8–4.9)
Alkaline Phosphatase: 79 IU/L (ref 39–117)
BUN/Creatinine Ratio: 11 (ref 9–20)
BUN: 7 mg/dL (ref 6–24)
Bilirubin Total: 0.8 mg/dL (ref 0.0–1.2)
CO2: 20 mmol/L (ref 20–29)
Calcium: 9 mg/dL (ref 8.7–10.2)
Chloride: 93 mmol/L — ABNORMAL LOW (ref 96–106)
Creatinine, Ser: 0.64 mg/dL — ABNORMAL LOW (ref 0.76–1.27)
GFR calc Af Amer: 124 mL/min/{1.73_m2} (ref >59)
GFR calc non Af Amer: 107 mL/min/{1.73_m2} (ref >59)
Globulin, Total: 2.2 g/dL (ref 1.5–4.5)
Glucose: 104 mg/dL — ABNORMAL HIGH (ref 65–99)
Potassium: 4.3 mmol/L (ref 3.5–5.2)
Sodium: 130 mmol/L — ABNORMAL LOW (ref 134–144)
Total Protein: 6.7 g/dL (ref 6.0–8.5)

## 2019-07-04 LAB — LIPID PANEL
Chol/HDL Ratio: 1.5 ratio (ref 0.0–5.0)
Cholesterol, Total: 151 mg/dL (ref 100–199)
HDL: 100 mg/dL (ref >39)
LDL Chol Calc (NIH): 42 mg/dL (ref 0–99)
Triglycerides: 32 mg/dL (ref 0–149)
VLDL Cholesterol Cal: 9 mg/dL (ref 5–40)

## 2019-07-04 LAB — TSH: TSH: 0.839 u[IU]/mL (ref 0.450–4.500)

## 2019-07-04 LAB — TESTOSTERONE,FREE AND TOTAL
Testosterone, Free: 16.4 pg/mL (ref 7.2–24.0)
Testosterone: 738 ng/dL (ref 264–916)

## 2019-07-05 ENCOUNTER — Telehealth: Payer: Self-pay

## 2019-07-05 NOTE — Telephone Encounter (Signed)
Pt returned call and lab result message given to him with verbal understanding.

## 2019-07-05 NOTE — Telephone Encounter (Signed)
LMTCB, okay for PEC to advise patient.  

## 2019-07-05 NOTE — Telephone Encounter (Signed)
-----   Message from [place], [place] sent at 07/05/2019  4:44 PM EST ----- All blood tests essentially normal except sodium chloride ("salt") is low. Testosterone level is very good (don't need any supplements). May add a little salt back in diet and should stop ETOH. Recheck levels in 1 month.

## 2019-08-24 NOTE — Progress Notes (Signed)
Established patient visit  I,Elena D DeSanto,acting as a scribe for [employer], PA.,have documented all relevant documentation on the behalf of Vernie Murders, PA,as directed by  [employer], PA while in the presence of [employer], [place].     Patient: Henry Hill   DOB: [birth year]   59 y.o. Male  MRN: [id number] Visit Date: 08/27/2019  Today's healthcare provider: Vernie Murders, PA  Subjective:   HPI -Colonoscopy Due - patient declined  The patient is a 59 year old non diabetic male who presents for evaluation of a sore on his left ankle.  He states that he has had the sore for 6 months.  He has been able to get it almost completely healed and then when the scab comes off it starts back big again.  The scab today is about the size of qa silver dollar.   He states that athe bandage has made it worse and when he takes it off he does sometimes get clear drainage.  Past Medical History:  Diagnosis Date  . Essential hypertension   . Varicose veins of both lower extremities    Past Surgical History:  Procedure Laterality Date  . BACK SURGERY  2003  . HEMORRHOID SURGERY N/A 11/13/2014   Procedure: LEFT LATERAL HEMORRHOIDECTOMY  ;  Surgeon: [name], MD;  Location: [facility];  Service: General;  Laterality: N/A;   Social History   Socioeconomic History  . Marital status: Married    Spouse name: Not on file  . Number of children: Not on file  . Years of education: Not on file  . Highest education level: Not on file  Occupational History  . Not on file  Tobacco Use  . Smoking status: Current Every Day Smoker    Packs/day: 0.75    Types: Cigarettes  . Smokeless tobacco: Never Used  Substance and Sexual Activity  . Alcohol use: Yes    Alcohol/week: 0.0 standard drinks    Comment: MODERATE USE  . Drug use: No  . Sexual activity: Not on file  Other Topics Concern  . Not on file  Social History Narrative  . Not on file   Social Determinants of Health    Financial Resource Strain:   . Difficulty of Paying Living Expenses:   Food Insecurity:   . Worried About Charity fundraiser in the Last Year:   . Arboriculturist in the Last Year:   Transportation Needs:   . Film/video editor (Medical):   Marland Kitchen Lack of Transportation (Non-Medical):   Physical Activity:   . Days of Exercise per Week:   . Minutes of Exercise per Session:   Stress:   . Feeling of Stress :   Social Connections:   . Frequency of Communication with Friends and Family:   . Frequency of Social Gatherings with Friends and Family:   . Attends Religious Services:   . Active Member of Clubs or Organizations:   . Attends Archivist Meetings:   Marland Kitchen Marital Status:   Intimate Partner Violence:   . Fear of Current or Ex-Partner:   . Emotionally Abused:   Marland Kitchen Physically Abused:   . Sexually Abused:    Family History  Problem Relation Age of Onset  . Heart attack Mother   . Diabetes Father   . Hypertension Father    No Known Allergies    Medications: Outpatient Medications Prior to Visit  Medication Sig  . acetaminophen (TYLENOL) 500 MG tablet Take  1,000 mg by mouth every 6 (six) hours as needed for mild pain, moderate pain or headache.  Marland Kitchen aspirin EC 81 MG tablet Take 81 mg by mouth daily.  . Multiple Vitamins-Minerals (MULTIVITAMIN ADULT PO) Take 1 tablet by mouth daily.  Marland Kitchen NIFEdipine (PROCARDIA XL/NIFEDICAL XL) 60 MG 24 hr tablet Take 1 tablet (60 mg total) by mouth daily.   No facility-administered medications prior to visit.    Review of Systems  Constitutional: Negative for fever.  Respiratory: Negative.   Genitourinary:       Erectile dysfunction. No dysuria, frequency or hematuria.  Skin: Positive for wound. Negative for color change and rash.        Objective:    BP (!) 160/80 (BP Location: Right Arm, Patient Position: Sitting, Cuff Size: Normal)   Pulse (!) 105   Temp (!) 97.1 F (36.2 C) (Skin)   Wt 205 lb (93 kg)   SpO2 98%   BMI  27.80 kg/m    Physical Exam Constitutional:      General: He is not in acute distress.    Appearance: He is well-developed.  HENT:     Head: Normocephalic and atraumatic.     Right Ear: Hearing normal.     Left Ear: Hearing normal.     Nose: Nose normal.  Eyes:     General: Lids are normal. No scleral icterus.       Right eye: No discharge.        Left eye: No discharge.     Conjunctiva/sclera: Conjunctivae normal.  Pulmonary:     Effort: Pulmonary effort is normal. No respiratory distress.  Musculoskeletal:        General: Normal range of motion.  Skin:    Findings: No lesion or rash.     Comments: Large 4 cm scabbed area medial left ankle. Extremely enlarged varicose veins of both legs.  Neurological:     Mental Status: He is alert and oriented to person, place, and time.  Psychiatric:        Speech: Speech normal.        Behavior: Behavior normal.        Thought Content: Thought content normal.         Assessment & Plan:    Problem List Items Addressed This Visit      Cardiovascular and Mediastinum   Varicose veins of leg with pain, bilateral - Primary    Decubitus like lesion of the left inner ankle.  Patient insistent on antibiotic.'Recommended dressing with Telfa and Coban, leave off when showering and dry well after shower. May need wound care referral if continues.      Relevant Medications   aspirin EC 81 MG tablet   doxycycline (VIBRA-TABS) 100 MG tablet     Failure of erection Continues to have inability to attain or maintain erections for intercourse. Denies any urinary frequency, dysuria, hesitancy, nocturia of significance or hematuria. Requests trial of Sildenafil (sent prescription for 20 mg 1-2 tablets prior to intercourse).   No follow-ups on file.     Andres Shad, PA, have reviewed all documentation for this visit. The documentation on 08/27/19 for the exam, diagnosis, procedures, and orders are all accurate and  complete.    Vernie Murders, [place] [phone] (phone) [phone] (fax)  [place]

## 2019-08-27 ENCOUNTER — Ambulatory Visit: Payer: 59 | Admitting: Family Medicine

## 2019-08-27 ENCOUNTER — Other Ambulatory Visit: Payer: Self-pay

## 2019-08-27 ENCOUNTER — Other Ambulatory Visit: Payer: Self-pay | Admitting: Family Medicine

## 2019-08-27 ENCOUNTER — Encounter: Payer: Self-pay | Admitting: Family Medicine

## 2019-08-27 VITALS — BP 160/80 | HR 105 | Temp 97.1°F | Wt 205.0 lb

## 2019-08-27 DIAGNOSIS — N529 Male erectile dysfunction, unspecified: Secondary | ICD-10-CM | POA: Diagnosis not present

## 2019-08-27 DIAGNOSIS — I83813 Varicose veins of bilateral lower extremities with pain: Secondary | ICD-10-CM

## 2019-08-27 MED ORDER — SILDENAFIL CITRATE 50 MG PO TABS: 50.0000 mg | ORAL_TABLET | Freq: Every day | ORAL | 1 refills | Status: DC | PRN

## 2019-08-27 MED ORDER — DOXYCYCLINE HYCLATE 100 MG PO TABS: 100.0000 mg | ORAL_TABLET | Freq: Two times a day (BID) | ORAL | 0 refills | Status: DC

## 2019-08-27 MED ORDER — SILDENAFIL CITRATE 20 MG PO TABS: ORAL_TABLET | ORAL | 2 refills | Status: DC

## 2019-08-27 NOTE — Assessment & Plan Note (Signed)
Decubitus like lesion of the left inner ankle.  Patient insistent on antibiotic.'Recommended dressing with Telfa and Coban, leave off when showering and dry well after shower. May need wound care referral if continues.

## 2019-08-27 NOTE — Patient Instructions (Addendum)
Keep area clean and dry.  Use Telfa and Coban to protect the area.  Take bandage off when showering.  Dry well after shower. Return to clinic as needed

## 2019-09-11 ENCOUNTER — Other Ambulatory Visit: Payer: Self-pay | Admitting: Family Medicine

## 2019-09-11 DIAGNOSIS — I10 Essential (primary) hypertension: Secondary | ICD-10-CM

## 2019-09-11 NOTE — Telephone Encounter (Signed)
Requested medication (s) are due for refill today:  Yes  Requested medication (s) are on the active medication list:  Yes  Future visit scheduled:  No  Last Refill: 06/21/19; #90; RF 0  Notes to clinic; pt. Was to have repeat labs done in March; please advise.  Requested Prescriptions  Pending Prescriptions Disp Refills   NIFEdipine (ADALAT CC) 60 MG 24 hr tablet [Pharmacy Med Name: NIFEDIPINE ER 60 MG TABLET] 90 tablet     Sig: TAKE 1 TABLET BY MOUTH EVERY DAY      Cardiovascular:  Calcium Channel Blockers Failed - 09/11/2019 11:31 AM      Failed - Last BP in normal range    BP Readings from Last 1 Encounters:  08/27/19 (!) 160/80          Passed - Valid encounter within last 6 months    Recent Outpatient Visits           2 weeks ago Varicose veins of leg with pain, bilateral   [place], [place]   2 months ago Essential (primary) hypertension   [facility], [place], [place]   2 years ago Essential (primary) hypertension   [facility], [place], [place]   2 years ago Pre-operative clearance   [place], [place]   2 years ago Acute pain of left knee   [facility], [place], [place]

## 2019-11-19 ENCOUNTER — Other Ambulatory Visit: Payer: Self-pay | Admitting: Family Medicine

## 2019-11-19 DIAGNOSIS — N529 Male erectile dysfunction, unspecified: Secondary | ICD-10-CM

## 2020-01-28 ENCOUNTER — Ambulatory Visit: Payer: 59 | Admitting: Family Medicine

## 2020-01-28 ENCOUNTER — Other Ambulatory Visit: Payer: Self-pay

## 2020-01-28 ENCOUNTER — Encounter: Payer: Self-pay | Admitting: Family Medicine

## 2020-01-28 VITALS — BP 137/79 | HR 92 | Temp 98.4°F | Wt 198.0 lb

## 2020-01-28 DIAGNOSIS — L89522 Pressure ulcer of left ankle, stage 2: Secondary | ICD-10-CM

## 2020-01-28 DIAGNOSIS — I83813 Varicose veins of bilateral lower extremities with pain: Secondary | ICD-10-CM | POA: Diagnosis not present

## 2020-01-28 MED ORDER — DUODERM CGF DRESSING EX MISC: CUTANEOUS | 3 refills | Status: DC

## 2020-01-28 NOTE — Progress Notes (Signed)
Acute Office Visit  Subjective:    Patient ID: Henry Hill, male    DOB: 09/16/1960, 59 y.o.   MRN: [id number]    HPI Patient is in today for left ankle pain.  He states it was healing until recently when the scab came off.  He used hydrogen peroxide on the area and now it is getting worse again.  He states that the area had a smell to it so he then started using the antibiotic ointment.  Past Medical History:  Diagnosis Date  . Essential hypertension   . Varicose veins of both lower extremities     Past Surgical History:  Procedure Laterality Date  . BACK SURGERY  2003  . HEMORRHOID SURGERY N/A 11/13/2014   Procedure: LEFT LATERAL HEMORRHOIDECTOMY  ;  Surgeon: [name], MD;  Location: [facility];  Service: General;  Laterality: N/A;    Family History  Problem Relation Age of Onset  . Heart attack Mother   . Diabetes Father   . Hypertension Father     Social History   Socioeconomic History  . Marital status: Married    Spouse name: Not on file  . Number of children: Not on file  . Years of education: Not on file  . Highest education level: Not on file  Occupational History  . Not on file  Tobacco Use  . Smoking status: Current Every Day Smoker    Packs/day: 0.75    Types: Cigarettes  . Smokeless tobacco: Never Used  Substance and Sexual Activity  . Alcohol use: Yes    Alcohol/week: 0.0 standard drinks    Comment: MODERATE USE  . Drug use: No  . Sexual activity: Not on file  Other Topics Concern  . Not on file  Social History Narrative  . Not on file   Social Determinants of Health   Financial Resource Strain:   . Difficulty of Paying Living Expenses: Not on file  Food Insecurity:   . Worried About Charity fundraiser in the Last Year: Not on file  . Ran Out of Food in the Last Year: Not on file  Transportation Needs:   . Lack of Transportation (Medical): Not on file  . Lack of Transportation (Non-Medical): Not on file  Physical Activity:   .  Days of Exercise per Week: Not on file  . Minutes of Exercise per Session: Not on file  Stress:   . Feeling of Stress : Not on file  Social Connections:   . Frequency of Communication with Friends and Family: Not on file  . Frequency of Social Gatherings with Friends and Family: Not on file  . Attends Religious Services: Not on file  . Active Member of Clubs or Organizations: Not on file  . Attends Archivist Meetings: Not on file  . Marital Status: Not on file  Intimate Partner Violence:   . Fear of Current or Ex-Partner: Not on file  . Emotionally Abused: Not on file  . Physically Abused: Not on file  . Sexually Abused: Not on file   No Known Allergies  Outpatient Medications Prior to Visit  Medication Sig Dispense Refill  . acetaminophen (TYLENOL) 500 MG tablet Take 1,000 mg by mouth every 6 (six) hours as needed for mild pain, moderate pain or headache.    Marland Kitchen aspirin EC 81 MG tablet Take 81 mg by mouth daily.    . Multiple Vitamins-Minerals (MULTIVITAMIN ADULT PO) Take 1 tablet by mouth daily.    Marland Kitchen  NIFEdipine (ADALAT CC) 60 MG 24 hr tablet TAKE 1 TABLET BY MOUTH EVERY DAY 90 tablet 3  . sildenafil (VIAGRA) 50 MG tablet TAKE 1 TABLET BY MOUTH DAILY AS NEEDED FOR ERECTILE DYSFUNCTION 6 tablet 3  . doxycycline (VIBRA-TABS) 100 MG tablet Take 1 tablet (100 mg total) by mouth 2 (two) times daily. 20 tablet 0   No facility-administered medications prior to visit.   Review of Systems  Constitutional: Negative for fever.       Objective:    BP 137/79 (BP Location: Right Arm, Patient Position: Sitting, Cuff Size: Normal)   Pulse 92   Temp 98.4 F (36.9 C) (Oral)   Wt 198 lb (89.8 kg)   SpO2 98%   BMI 26.85 kg/m  Wt Readings from Last 3 Encounters:  01/28/20 198 lb (89.8 kg)  08/27/19 205 lb (93 kg)  06/22/19 205 lb (93 kg)   Physical Exam Constitutional:      General: He is not in acute distress.    Appearance: He is well-developed.  HENT:     Head:  Normocephalic and atraumatic.     Right Ear: Hearing normal.     Left Ear: Hearing normal.     Nose: Nose normal.  Eyes:     General: Lids are normal. No scleral icterus.       Right eye: No discharge.        Left eye: No discharge.     Conjunctiva/sclera: Conjunctivae normal.  Pulmonary:     Effort: Pulmonary effort is normal. No respiratory distress.  Musculoskeletal:        General: Normal range of motion.  Skin:    Findings: Lesion present. No rash.     Comments: 1.5 x 2.0 cm stage 2 decubitus ulcer left medial ankle with some dry skin surrounding from healing Band-Aid adhesive irritation. Many very large varicose veins both legs.  Neurological:     Mental Status: He is alert and oriented to person, place, and time.  Psychiatric:        Speech: Speech normal.        Behavior: Behavior normal.        Thought Content: Thought content normal.     Health Maintenance Due  Topic Date Due  . INFLUENZA VACCINE  Never done    There are no preventive care reminders to display for this patient.   Lab Results  Component Value Date   TSH 0.839 07/03/2019   Lab Results  Component Value Date   WBC 8.5 07/03/2019   HGB 15.0 07/03/2019   HCT 42.0 07/03/2019   MCV 95 07/03/2019   PLT 329 07/03/2019   Lab Results  Component Value Date   NA 130 (L) 07/03/2019   K 4.3 07/03/2019   CO2 20 07/03/2019   GLUCOSE 104 (H) 07/03/2019   BUN 7 07/03/2019   CREATININE 0.64 (L) 07/03/2019   BILITOT 0.8 07/03/2019   ALKPHOS 79 07/03/2019   AST 40 07/03/2019   ALT 35 07/03/2019   PROT 6.7 07/03/2019   ALBUMIN 4.5 07/03/2019   CALCIUM 9.0 07/03/2019   ANIONGAP 8 11/13/2014   Lab Results  Component Value Date   CHOL 151 07/03/2019   Lab Results  Component Value Date   HDL 100 07/03/2019   Lab Results  Component Value Date   LDLCALC 42 07/03/2019   Lab Results  Component Value Date   TRIG 32 07/03/2019   Lab Results  Component Value Date   CHOLHDL 1.5 07/03/2019  Lab  Results  Component Value Date   HGBA1C 5.3 01/05/2017       Assessment & Plan:   1. Decubitus ulcer of left ankle, stage 2 ([place]) Has been dressing this decubitus ulcer since April 2021. Deminsions are smaller than at initial evaluation. Declines wound care referral since it is smaller. Will try Duoderm Patches and change once every 3-7 days. Recheck in 2 weeks. - Control Gel Formula Dressing (DUODERM CGF DRESSING) MISC; Apply patch to ulcer on left ankle and change once a week.  Dispense: 5 each; Refill: 3  2. Varicose veins of leg with pain, bilateral Unchanged. Extremely large varicose both legs from thighs down.   Andres Shad, PA, have reviewed all documentation for this visit. The documentation on 01/28/20 for the exam, diagnosis, procedures, and orders are all accurate and complete.    Juluis Mire, CMA

## 2020-02-21 ENCOUNTER — Ambulatory Visit: Payer: 59 | Admitting: Family Medicine

## 2020-02-21 ENCOUNTER — Other Ambulatory Visit: Payer: Self-pay

## 2020-02-21 ENCOUNTER — Encounter: Payer: Self-pay | Admitting: Family Medicine

## 2020-02-21 VITALS — BP 144/79 | HR 89 | Temp 98.1°F | Wt 197.0 lb

## 2020-02-21 DIAGNOSIS — Z716 Tobacco abuse counseling: Secondary | ICD-10-CM | POA: Diagnosis not present

## 2020-02-21 DIAGNOSIS — I83813 Varicose veins of bilateral lower extremities with pain: Secondary | ICD-10-CM

## 2020-02-21 DIAGNOSIS — L89522 Pressure ulcer of left ankle, stage 2: Secondary | ICD-10-CM

## 2020-02-21 NOTE — Progress Notes (Signed)
Acute Office Visit  Subjective:    Patient ID: Henry Hill, male    DOB: [birth year], 59 y.o.   MRN: [id number]    HPI Patient is in today for follow up of ulcer on left ankle.  He states that he is doing some better and is back down in size.  He is using advanced healing hydrate colloid.    Past Medical History:  Diagnosis Date  . Essential hypertension   . Varicose veins of both lower extremities     Past Surgical History:  Procedure Laterality Date  . BACK SURGERY  2003  . HEMORRHOID SURGERY N/A 11/13/2014   Procedure: LEFT LATERAL HEMORRHOIDECTOMY  ;  Surgeon: [name], MD;  Location: [facility];  Service: General;  Laterality: N/A;    Family History  Problem Relation Age of Onset  . Heart attack Mother   . Diabetes Father   . Hypertension Father     Social History   Socioeconomic History  . Marital status: Married    Spouse name: Not on file  . Number of children: Not on file  . Years of education: Not on file  . Highest education level: Not on file  Occupational History  . Not on file  Tobacco Use  . Smoking status: Current Every Day Smoker    Packs/day: 0.75    Types: Cigarettes  . Smokeless tobacco: Never Used  Substance and Sexual Activity  . Alcohol use: Yes    Alcohol/week: 0.0 standard drinks    Comment: MODERATE USE  . Drug use: No  . Sexual activity: Not on file  Other Topics Concern  . Not on file  Social History Narrative  . Not on file   Social Determinants of Health   Financial Resource Strain:   . Difficulty of Paying Living Expenses: Not on file  Food Insecurity:   . Worried About Charity fundraiser in the Last Year: Not on file  . Ran Out of Food in the Last Year: Not on file  Transportation Needs:   . Lack of Transportation (Medical): Not on file  . Lack of Transportation (Non-Medical): Not on file  Physical Activity:   . Days of Exercise per Week: Not on file  . Minutes of Exercise per Session: Not on file  Stress:   .  Feeling of Stress : Not on file  Social Connections:   . Frequency of Communication with Friends and Family: Not on file  . Frequency of Social Gatherings with Friends and Family: Not on file  . Attends Religious Services: Not on file  . Active Member of Clubs or Organizations: Not on file  . Attends Archivist Meetings: Not on file  . Marital Status: Not on file  Intimate Partner Violence:   . Fear of Current or Ex-Partner: Not on file  . Emotionally Abused: Not on file  . Physically Abused: Not on file  . Sexually Abused: Not on file    Outpatient Medications Prior to Visit  Medication Sig Dispense Refill  . acetaminophen (TYLENOL) 500 MG tablet Take 1,000 mg by mouth every 6 (six) hours as needed for mild pain, moderate pain or headache.    Marland Kitchen aspirin EC 81 MG tablet Take 81 mg by mouth daily.    . Control Gel Formula Dressing (DUODERM CGF DRESSING) MISC Apply patch to ulcer on left ankle and change once a week. 5 each 3  . Multiple Vitamins-Minerals (MULTIVITAMIN ADULT PO) Take 1 tablet by mouth  daily.    Marland Kitchen NIFEdipine (ADALAT CC) 60 MG 24 hr tablet TAKE 1 TABLET BY MOUTH EVERY DAY 90 tablet 3  . sildenafil (VIAGRA) 50 MG tablet TAKE 1 TABLET BY MOUTH DAILY AS NEEDED FOR ERECTILE DYSFUNCTION 6 tablet 3   No facility-administered medications prior to visit.    No Known Allergies  Review of Systems  Skin: Positive for wound. Negative for color change.      Objective:    Physical Exam Constitutional:      General: He is not in acute distress.    Appearance: He is well-developed.  HENT:     Head: Normocephalic and atraumatic.     Right Ear: Hearing normal.     Left Ear: Hearing normal.     Nose: Nose normal.  Eyes:     General: Lids are normal. No scleral icterus.       Right eye: No discharge.        Left eye: No discharge.     Conjunctiva/sclera: Conjunctivae normal.  Cardiovascular:     Rate and Rhythm: Normal rate and regular rhythm.     Heart sounds:  Normal heart sounds.  Pulmonary:     Effort: Pulmonary effort is normal. No respiratory distress.     Breath sounds: Normal breath sounds.  Abdominal:     General: Bowel sounds are normal.     Palpations: Abdomen is soft.  Musculoskeletal:        General: Normal range of motion.  Skin:    Findings: Lesion present. No rash.     Comments: More shallow 1.2 X 1.7 cm decubitus medial side of the left ankle. No drainage or tenderness. Very large varicose veins entire legs - bilaterally.  Neurological:     Mental Status: He is alert and oriented to person, place, and time.  Psychiatric:        Speech: Speech normal.        Behavior: Behavior normal.        Thought Content: Thought content normal.     BP (!) 144/79 (BP Location: Right Arm, Patient Position: Sitting, Cuff Size: Normal)   Pulse 89   Temp 98.1 F (36.7 C) (Oral)   Wt 197 lb (89.4 kg)   SpO2 98%   BMI 26.72 kg/m  Wt Readings from Last 3 Encounters:  02/21/20 197 lb (89.4 kg)  01/28/20 198 lb (89.8 kg)  08/27/19 205 lb (93 kg)   BP Readings from Last 3 Encounters:  02/21/20 (!) 144/79  01/28/20 137/79  08/27/19 (!) 160/80     Health Maintenance Due  Topic Date Due  . INFLUENZA VACCINE  Never done  . COVID-19 Vaccine (2 - Moderna 2-dose series) 02/04/2020    There are no preventive care reminders to display for this patient.   Lab Results  Component Value Date   TSH 0.839 07/03/2019   Lab Results  Component Value Date   WBC 8.5 07/03/2019   HGB 15.0 07/03/2019   HCT 42.0 07/03/2019   MCV 95 07/03/2019   PLT 329 07/03/2019   Lab Results  Component Value Date   NA 130 (L) 07/03/2019   K 4.3 07/03/2019   CO2 20 07/03/2019   GLUCOSE 104 (H) 07/03/2019   BUN 7 07/03/2019   CREATININE 0.64 (L) 07/03/2019   BILITOT 0.8 07/03/2019   ALKPHOS 79 07/03/2019   AST 40 07/03/2019   ALT 35 07/03/2019   PROT 6.7 07/03/2019   ALBUMIN 4.5 07/03/2019   CALCIUM 9.0  07/03/2019   ANIONGAP 8 11/13/2014   Lab  Results  Component Value Date   CHOL 151 07/03/2019   Lab Results  Component Value Date   HDL 100 07/03/2019   Lab Results  Component Value Date   LDLCALC 42 07/03/2019   Lab Results  Component Value Date   TRIG 32 07/03/2019   Lab Results  Component Value Date   CHOLHDL 1.5 07/03/2019   Lab Results  Component Value Date   HGBA1C 5.3 01/05/2017       Assessment & Plan:   1. Decubitus ulcer of left ankle, stage 2 (HCC) Smaller decubitus left ankle that is more shallow. Responding well to the Duoderm Hydrophilic patches. Changes patches and washes lesion every 2-3 days. Well pleased with improvement and wants to continue this process. Will follow up as needed.  2. Varicose veins of leg with pain, bilateral Has tried Ibuprofen 200 mg BID for aches in legs and feels it clears the discomfort very well. No significant swelling today.  3. Tobacco abuse counseling Declines Chantix, Nicotine patches and Buproprion. Encouraged to work on total cessation, but doubt he will comply. No orders of the defined types were placed in this encounter.  Andres Shad, PA, have reviewed all documentation for this visit. The documentation on 02/21/20 for the exam, diagnosis, procedures, and orders are all accurate and complete.   Juluis Mire, CMA

## 2020-03-31 ENCOUNTER — Other Ambulatory Visit: Payer: Self-pay | Admitting: Family Medicine

## 2020-03-31 DIAGNOSIS — N529 Male erectile dysfunction, unspecified: Secondary | ICD-10-CM

## 2020-07-31 ENCOUNTER — Other Ambulatory Visit: Payer: Self-pay | Admitting: Family Medicine

## 2020-07-31 DIAGNOSIS — N529 Male erectile dysfunction, unspecified: Secondary | ICD-10-CM

## 2020-08-27 ENCOUNTER — Ambulatory Visit (INDEPENDENT_AMBULATORY_CARE_PROVIDER_SITE_OTHER): Payer: 59 | Admitting: Family Medicine

## 2020-08-27 ENCOUNTER — Other Ambulatory Visit: Payer: Self-pay

## 2020-08-27 ENCOUNTER — Encounter: Payer: Self-pay | Admitting: Family Medicine

## 2020-08-27 ENCOUNTER — Other Ambulatory Visit: Payer: Self-pay | Admitting: Family Medicine

## 2020-08-27 VITALS — BP 157/92 | HR 104 | Temp 98.4°F | Wt 197.0 lb

## 2020-08-27 DIAGNOSIS — S81802D Unspecified open wound, left lower leg, subsequent encounter: Secondary | ICD-10-CM

## 2020-08-27 MED ORDER — SANTYL 250 UNIT/GM EX OINT: 1.0000 "application " | TOPICAL_OINTMENT | Freq: Every day | CUTANEOUS | 0 refills | Status: DC

## 2020-08-27 MED ORDER — REGRANEX 0.01 % EX GEL: 1.0000 "application " | Freq: Every day | CUTANEOUS | 0 refills | Status: DC

## 2020-08-27 NOTE — Progress Notes (Addendum)
Established patient visit   Patient: Henry Hill   DOB: 1960-09-26   60 y.o. Male  MRN: [id number] Visit Date: 08/27/2020  Today's healthcare provider: Laurita Quint Bengie Kaucher, FNP   Chief Complaint  Patient presents with  . Wound Check   Subjective    Wound Check There has been no drainage from the wound. There is no redness present. There is no swelling present. Pain course: Some pain.    Has an ulcer to his left lower extremity Had for the past year Currently using Terrasil and doing daily dressing changes Has never been to wound clinic Has been seen in clinic by Simona Huh for this Is currently a smoker   Patient Active Problem List   Diagnosis Date Noted  . Hx of total knee replacement, left 06/16/2017  . Swelling of limb 11/23/2016  . Internal and external thrombosed hemorrhoids 11/13/2014  . Anal pain 11/08/2014  . Osteoarthritis of knee 11/01/2014  . Failure of erection 11/01/2014  . Varicose veins of leg with pain, bilateral 11/01/2014  . Hypo-osmolality and hyponatremia 10/14/2009  . Avitaminosis D 10/14/2009  . Decreased libido 09/11/2009  . Malaise and fatigue 09/11/2009  . Phobia 05/16/2007  . Essential (primary) hypertension 02/04/2006   Past Medical History:  Diagnosis Date  . Essential hypertension   . Varicose veins of both lower extremities    Social History   Tobacco Use  . Smoking status: Current Every Day Smoker    Packs/day: 0.75    Types: Cigarettes  . Smokeless tobacco: Never Used  Substance Use Topics  . Alcohol use: Yes    Alcohol/week: 0.0 standard drinks    Comment: MODERATE USE  . Drug use: No   No Known Allergies   Medications: Outpatient Medications Prior to Visit  Medication Sig  . acetaminophen (TYLENOL) 500 MG tablet Take 1,000 mg by mouth every 6 (six) hours as needed for mild pain, moderate pain or headache.  Marland Kitchen aspirin EC 81 MG tablet Take 81 mg by mouth daily.  . Multiple Vitamins-Minerals (MULTIVITAMIN ADULT PO) Take 1  tablet by mouth daily.  Marland Kitchen NIFEdipine (ADALAT CC) 60 MG 24 hr tablet TAKE 1 TABLET BY MOUTH EVERY DAY  . sildenafil (VIAGRA) 50 MG tablet TAKE 1 TABLET BY MOUTH DAILY AS NEEDED FOR ERECTILE DYSFUNCTION  . Control Gel Formula Dressing (DUODERM CGF DRESSING) MISC Apply patch to ulcer on left ankle and change once a week.   No facility-administered medications prior to visit.    Review of Systems  Constitutional: Negative.   Respiratory: Negative.   Cardiovascular: Negative.   Skin: Negative for wound.        Objective    BP (!) 157/92 (BP Location: Left Arm, Patient Position: Sitting, Cuff Size: Large)   Pulse (!) 104   Temp 98.4 F (36.9 C) (Oral)   Wt 197 lb (89.4 kg)   BMI 26.72 kg/m    Physical Exam Vitals reviewed.  Constitutional:      Appearance: Normal appearance.  HENT:     Head: Normocephalic and atraumatic.  Eyes:     Conjunctiva/sclera: Conjunctivae normal.     Pupils: Pupils are equal, round, and reactive to light.  Cardiovascular:     Rate and Rhythm: Normal rate and regular rhythm.     Pulses: Normal pulses.     Heart sounds: Normal heart sounds. No murmur heard. No friction rub. No gallop.   Pulmonary:     Effort: Pulmonary effort is normal. No respiratory  distress.     Breath sounds: Normal breath sounds. No stridor. No wheezing or rales.  Abdominal:     General: Bowel sounds are normal.     Palpations: Abdomen is soft.     Tenderness: There is no abdominal tenderness.  Musculoskeletal:     Right lower leg: No edema.     Left lower leg: No edema.  Skin:    General: Skin is warm and dry.     Findings: Wound (x2 to Left lower extremity. see pictures in chart) present.  Neurological:     General: No focal deficit present.     Mental Status: He is alert and oriented to person, place, and time.  Psychiatric:        Mood and Affect: Mood normal.        Behavior: Behavior normal.          No results found for any visits on 08/27/20.   Assessment & Plan     Problem List Items Addressed This Visit   None   Visit Diagnoses    Non-healing wound of lower extremity, left, subsequent encounter    -  Primary   Relevant Medications   collagenase (SANTYL) ointment   becaplermin (REGRANEX) 0.01 % gel   Other Relevant Orders   Ambulatory referral to [facility]   Ambulatory referral to Vascular Surgery     Plan Continue daily dressing changes Orders for ointments placed Referral placed to wound care and vascular RTC/ED precautions provided  Return in about 3 months (around 11/26/2020).      [place], [place] [phone] (phone) [phone] (fax)  [place]

## 2020-08-27 NOTE — Patient Instructions (Signed)
Wound Care, Adult Taking care of your wound properly can help to prevent pain, infection, and scarring. It can also help your wound heal more quickly. Follow instructions from your health care provider about how to care for your wound. Supplies needed:  Soap and water.  Wound cleanser.  Gauze.  If needed, a clean bandage (dressing) or other type of wound dressing material to cover or place in the wound. Follow your health care provider's instructions about what dressing supplies to use.  Cream or ointment to apply to the wound, if told by your health care provider. How to care for your wound Cleaning the wound Ask your health care provider how to clean the wound. This may include:  Using mild soap and water or a wound cleanser.  Using a clean gauze to pat the wound dry after cleaning it. Do not rub or scrub the wound. Dressing care  Wash your hands with soap and water for at least 20 seconds before and after you change the dressing. If soap and water are not available, use hand sanitizer.  Change your dressing as told by your health care provider. This may include: ? Cleaning or rinsing out (irrigating) the wound. ? Placing a dressing over the wound or in the wound (packing). ? Covering the wound with an outer dressing.  Leave any stitches (sutures), skin glue, or adhesive strips in place. These skin closures may need to stay in place for 2 weeks or longer. If adhesive strip edges start to loosen and curl up, you may trim the loose edges. Do not remove adhesive strips completely unless your health care provider tells you to do that.  Ask your health care provider when you can leave the wound uncovered. Checking for infection Check your wound area every day for signs of infection. Check for:  More redness, swelling, or pain.  Fluid or blood.  Warmth.  Pus or a bad smell.   Follow these instructions at home Medicines  If you were prescribed an antibiotic medicine, cream, or  ointment, take or apply it as told by your health care provider. Do not stop using the antibiotic even if your condition improves.  If you were prescribed pain medicine, take it 30 minutes before you do any wound care or as told by your health care provider.  Take over-the-counter and prescription medicines only as told by your health care provider. Eating and drinking  Eat a diet that includes protein, vitamin A, vitamin C, and other nutrient-rich foods to help the wound heal. ? Foods rich in protein include meat, fish, eggs, dairy, beans, and nuts. ? Foods rich in vitamin A include carrots and dark green, leafy vegetables. ? Foods rich in vitamin C include citrus fruits, tomatoes, broccoli, and peppers.  Drink enough fluid to keep your urine pale yellow. General instructions  Do not take baths, swim, use a hot tub, or do anything that would put the wound underwater until your health care provider approves. Ask your health care provider if you may take showers. You may only be allowed to take sponge baths.  Do not scratch or pick at the wound. Keep it covered as told by your health care provider.  Return to your normal activities as told by your health care provider. Ask your health care provider what activities are safe for you.  Protect your wound from the sun when you are outside for the first 6 months, or for as long as told by your health care provider. Cover   up the scar area or apply sunscreen that has an SPF of at least 30.  Do not use any products that contain nicotine or tobacco, such as cigarettes, e-cigarettes, and chewing tobacco. These may delay wound healing. If you need help quitting, ask your health care provider.  Keep all follow-up visits as told by your health care provider. This is important. Contact a health care provider if:  You received a tetanus shot and you have swelling, severe pain, redness, or bleeding at the injection site.  Your pain is not controlled  with medicine.  You have any of these signs of infection: ? More redness, swelling, or pain around the wound. ? Fluid or blood coming from the wound. ? Warmth coming from the wound. ? Pus or a bad smell coming from the wound. ? A fever or chills.  You are nauseous or you vomit.  You are dizzy. Get help right away if:  You have a red streak of skin near the area around your wound.  Your wound has been closed with staples, sutures, skin glue, or adhesive strips and it begins to open up and separate.  Your wound is bleeding, and the bleeding does not stop with gentle pressure.  You have a rash.  You faint.  You have trouble breathing. These symptoms may represent a serious problem that is an emergency. Do not wait to see if the symptoms will go away. Get medical help right away. Call your local emergency services (911 in the [place] Do not drive yourself to the hospital. Summary  Always wash your hands with soap and water for at least 20 seconds before and after changing your dressing.  Change your dressing as told by your health care provider.  To help with healing, eat foods that are rich in protein, vitamin A, vitamin C, and other nutrients.  Check your wound every day for signs of infection. Contact your health care provider if you suspect that your wound is infected. This information is not intended to replace advice given to you by your health care provider. Make sure you discuss any questions you have with your health care provider. Document Revised: 02/09/2019 Document Reviewed: 02/09/2019 [employer]vier Patient Education  2021 [employer]  

## 2020-08-30 ENCOUNTER — Emergency Department: Payer: 59

## 2020-08-30 ENCOUNTER — Encounter
Admission: EM | Disposition: A | Discharge status: Home / Self Care | Payer: Self-pay | Source: Home / Self Care | Attending: Surgery

## 2020-08-30 ENCOUNTER — Inpatient Hospital Stay
Admission: EM | Admit: 2020-08-30 | Discharge: 2020-09-07 | DRG: 329 | Disposition: A | Discharge status: Home / Self Care | Payer: 59 | Attending: Surgery | Admitting: Surgery

## 2020-08-30 ENCOUNTER — Other Ambulatory Visit: Payer: Self-pay

## 2020-08-30 ENCOUNTER — Inpatient Hospital Stay: Payer: 59 | Admitting: Registered Nurse

## 2020-08-30 DIAGNOSIS — R000 Tachycardia, unspecified: Secondary | ICD-10-CM | POA: Diagnosis not present

## 2020-08-30 DIAGNOSIS — N179 Acute kidney failure, unspecified: Secondary | ICD-10-CM

## 2020-08-30 DIAGNOSIS — Z79899 Other long term (current) drug therapy: Secondary | ICD-10-CM

## 2020-08-30 DIAGNOSIS — E860 Dehydration: Secondary | ICD-10-CM | POA: Diagnosis present

## 2020-08-30 DIAGNOSIS — K56609 Unspecified intestinal obstruction, unspecified as to partial versus complete obstruction: Secondary | ICD-10-CM | POA: Diagnosis present

## 2020-08-30 DIAGNOSIS — I8393 Asymptomatic varicose veins of bilateral lower extremities: Secondary | ICD-10-CM | POA: Diagnosis present

## 2020-08-30 DIAGNOSIS — F17210 Nicotine dependence, cigarettes, uncomplicated: Secondary | ICD-10-CM | POA: Diagnosis present

## 2020-08-30 DIAGNOSIS — Z7982 Long term (current) use of aspirin: Secondary | ICD-10-CM | POA: Diagnosis not present

## 2020-08-30 DIAGNOSIS — K659 Peritonitis, unspecified: Secondary | ICD-10-CM | POA: Diagnosis present

## 2020-08-30 DIAGNOSIS — I4891 Unspecified atrial fibrillation: Secondary | ICD-10-CM

## 2020-08-30 DIAGNOSIS — F1010 Alcohol abuse, uncomplicated: Secondary | ICD-10-CM | POA: Diagnosis present

## 2020-08-30 DIAGNOSIS — N141 Nephropathy induced by other drugs, medicaments and biological substances: Secondary | ICD-10-CM | POA: Diagnosis present

## 2020-08-30 DIAGNOSIS — E872 Acidosis: Secondary | ICD-10-CM | POA: Diagnosis present

## 2020-08-30 DIAGNOSIS — K562 Volvulus: Principal | ICD-10-CM | POA: Diagnosis present

## 2020-08-30 DIAGNOSIS — I10 Essential (primary) hypertension: Secondary | ICD-10-CM | POA: Diagnosis present

## 2020-08-30 DIAGNOSIS — R1084 Generalized abdominal pain: Secondary | ICD-10-CM

## 2020-08-30 DIAGNOSIS — T508X5A Adverse effect of diagnostic agents, initial encounter: Secondary | ICD-10-CM | POA: Diagnosis present

## 2020-08-30 DIAGNOSIS — I472 Ventricular tachycardia: Secondary | ICD-10-CM | POA: Diagnosis not present

## 2020-08-30 DIAGNOSIS — Z20822 Contact with and (suspected) exposure to covid-19: Secondary | ICD-10-CM | POA: Diagnosis present

## 2020-08-30 DIAGNOSIS — E878 Other disorders of electrolyte and fluid balance, not elsewhere classified: Secondary | ICD-10-CM | POA: Diagnosis present

## 2020-08-30 DIAGNOSIS — Z7901 Long term (current) use of anticoagulants: Secondary | ICD-10-CM

## 2020-08-30 DIAGNOSIS — I959 Hypotension, unspecified: Secondary | ICD-10-CM | POA: Diagnosis not present

## 2020-08-30 DIAGNOSIS — Z95828 Presence of other vascular implants and grafts: Secondary | ICD-10-CM

## 2020-08-30 DIAGNOSIS — E876 Hypokalemia: Secondary | ICD-10-CM | POA: Diagnosis present

## 2020-08-30 DIAGNOSIS — I739 Peripheral vascular disease, unspecified: Secondary | ICD-10-CM | POA: Diagnosis present

## 2020-08-30 DIAGNOSIS — I4819 Other persistent atrial fibrillation: Secondary | ICD-10-CM | POA: Diagnosis not present

## 2020-08-30 DIAGNOSIS — E871 Hypo-osmolality and hyponatremia: Secondary | ICD-10-CM | POA: Diagnosis present

## 2020-08-30 HISTORY — DX: Volvulus: K56.2

## 2020-08-30 HISTORY — DX: Alcohol abuse, uncomplicated: F10.10

## 2020-08-30 HISTORY — DX: Other ill-defined heart diseases: I51.89

## 2020-08-30 HISTORY — DX: Tobacco use: Z72.0

## 2020-08-30 HISTORY — PX: LAPAROTOMY: SHX154

## 2020-08-30 HISTORY — DX: Other persistent atrial fibrillation: I48.19

## 2020-08-30 LAB — CBC WITH DIFFERENTIAL/PLATELET
Abs Immature Granulocytes: 0.03 10*3/uL (ref 0.00–0.07)
Basophils Absolute: 0.1 10*3/uL (ref 0.0–0.1)
Basophils Relative: 1 %
Eosinophils Absolute: 0 10*3/uL (ref 0.0–0.5)
Eosinophils Relative: 0 %
HCT: 43.8 % (ref 39.0–52.0)
Hemoglobin: 16.1 g/dL (ref 13.0–17.0)
Immature Granulocytes: 0 %
Lymphocytes Relative: 8 %
Lymphs Abs: 1 10*3/uL (ref 0.7–4.0)
MCH: 33.8 pg (ref 26.0–34.0)
MCHC: 36.8 g/dL — ABNORMAL HIGH (ref 30.0–36.0)
MCV: 91.8 fL (ref 80.0–100.0)
Monocytes Absolute: 1.6 10*3/uL — ABNORMAL HIGH (ref 0.1–1.0)
Monocytes Relative: 13 %
Neutro Abs: 9.2 10*3/uL — ABNORMAL HIGH (ref 1.7–7.7)
Neutrophils Relative %: 78 %
Platelets: 326 10*3/uL (ref 150–400)
RBC: 4.77 MIL/uL (ref 4.22–5.81)
RDW: 13.2 % (ref 11.5–15.5)
Smear Review: NORMAL
WBC: 11.8 10*3/uL — ABNORMAL HIGH (ref 4.0–10.5)
nRBC: 0 % (ref 0.0–0.2)

## 2020-08-30 LAB — RESP PANEL BY RT-PCR (FLU A&B, COVID) ARPGX2
Influenza A by PCR: NEGATIVE
Influenza B by PCR: NEGATIVE
SARS Coronavirus 2 by RT PCR: NEGATIVE

## 2020-08-30 LAB — BASIC METABOLIC PANEL
Anion gap: 12 (ref 5–15)
BUN: 52 mg/dL — ABNORMAL HIGH (ref 6–20)
CO2: 27 mmol/L (ref 22–32)
Calcium: 7.8 mg/dL — ABNORMAL LOW (ref 8.9–10.3)
Chloride: 88 mmol/L — ABNORMAL LOW (ref 98–111)
Creatinine, Ser: 1.15 mg/dL (ref 0.61–1.24)
GFR, Estimated: >60 mL/min (ref >60)
Glucose, Bld: 127 mg/dL — ABNORMAL HIGH (ref 70–99)
Potassium: 3.4 mmol/L — ABNORMAL LOW (ref 3.5–5.1)
Sodium: 127 mmol/L — ABNORMAL LOW (ref 135–145)

## 2020-08-30 LAB — COMPREHENSIVE METABOLIC PANEL
ALT: 55 U/L — ABNORMAL HIGH (ref 0–44)
AST: 54 U/L — ABNORMAL HIGH (ref 15–41)
Albumin: 4.6 g/dL (ref 3.5–5.0)
Alkaline Phosphatase: 54 U/L (ref 38–126)
Anion gap: 22 — ABNORMAL HIGH (ref 5–15)
BUN: 72 mg/dL — ABNORMAL HIGH (ref 6–20)
CO2: 28 mmol/L (ref 22–32)
Calcium: 9.9 mg/dL (ref 8.9–10.3)
Chloride: 75 mmol/L — ABNORMAL LOW (ref 98–111)
Creatinine, Ser: 1.62 mg/dL — ABNORMAL HIGH (ref 0.61–1.24)
GFR, Estimated: 48 mL/min — ABNORMAL LOW (ref >60)
Glucose, Bld: 177 mg/dL — ABNORMAL HIGH (ref 70–99)
Potassium: 3.3 mmol/L — ABNORMAL LOW (ref 3.5–5.1)
Sodium: 125 mmol/L — ABNORMAL LOW (ref 135–145)
Total Bilirubin: 1.5 mg/dL — ABNORMAL HIGH (ref 0.3–1.2)
Total Protein: 8 g/dL (ref 6.5–8.1)

## 2020-08-30 LAB — PROTIME-INR
INR: 0.9 (ref 0.8–1.2)
Prothrombin Time: 12.3 seconds (ref 11.4–15.2)

## 2020-08-30 LAB — TROPONIN I (HIGH SENSITIVITY)
Troponin I (High Sensitivity): 33 ng/L — ABNORMAL HIGH (ref <18)
Troponin I (High Sensitivity): 36 ng/L — ABNORMAL HIGH (ref <18)
Troponin I (High Sensitivity): 20 ng/L — ABNORMAL HIGH (ref <18)

## 2020-08-30 LAB — MAGNESIUM
Magnesium: 2.2 mg/dL (ref 1.7–2.4)
Magnesium: 1.8 mg/dL (ref 1.7–2.4)

## 2020-08-30 LAB — LACTIC ACID, PLASMA
Lactic Acid, Venous: 4.3 mmol/L (ref 0.5–1.9)
Lactic Acid, Venous: 2.2 mmol/L (ref 0.5–1.9)
Lactic Acid, Venous: 1.4 mmol/L (ref 0.5–1.9)

## 2020-08-30 LAB — TYPE AND SCREEN
ABO/RH(D): O POS
Antibody Screen: NEGATIVE

## 2020-08-30 LAB — I-STAT CREATININE, ED: Creatinine, Ser: 1.8 mg/dL — ABNORMAL HIGH (ref 0.61–1.24)

## 2020-08-30 LAB — TSH: TSH: 0.864 u[IU]/mL (ref 0.350–4.500)

## 2020-08-30 SURGERY — LAPAROTOMY, EXPLORATORY
Anesthesia: General | Laterality: Right

## 2020-08-30 MED ORDER — ONDANSETRON HCL 4 MG/2ML IJ SOLN
INTRAMUSCULAR | Status: AC
  Administered 2020-08-30: 4 mg via INTRAVENOUS
  Filled 2020-08-30: qty 2

## 2020-08-30 MED ORDER — FENTANYL CITRATE (PF) 100 MCG/2ML IJ SOLN
INTRAMUSCULAR | Status: DC | PRN
  Administered 2020-08-30 (×3): 50 ug via INTRAVENOUS

## 2020-08-30 MED ORDER — FENTANYL CITRATE (PF) 100 MCG/2ML IJ SOLN
INTRAMUSCULAR | Status: AC
  Filled 2020-08-30: qty 2

## 2020-08-30 MED ORDER — LACTATED RINGERS IV SOLN: INTRAVENOUS | Status: DC | PRN

## 2020-08-30 MED ORDER — EPINEPHRINE 1 MG/10ML IJ SOSY
PREFILLED_SYRINGE | INTRAMUSCULAR | Status: DC | PRN
  Administered 2020-08-30: .02 mg via INTRAVENOUS

## 2020-08-30 MED ORDER — PIPERACILLIN-TAZOBACTAM 3.375 G IVPB
3.3750 g | Freq: Three times a day (TID) | INTRAVENOUS | Status: DC
  Administered 2020-08-31 – 2020-09-03 (×11): 3.375 g via INTRAVENOUS
  Filled 2020-08-30 (×11): qty 50

## 2020-08-30 MED ORDER — KETOROLAC TROMETHAMINE 15 MG/ML IJ SOLN
15.0000 mg | Freq: Four times a day (QID) | INTRAMUSCULAR | Status: AC | PRN
  Administered 2020-08-31 – 2020-09-03 (×7): 15 mg via INTRAVENOUS
  Filled 2020-08-30 (×9): qty 1

## 2020-08-30 MED ORDER — ONDANSETRON HCL 4 MG/2ML IJ SOLN
4.0000 mg | Freq: Once | INTRAMUSCULAR | Status: AC
  Administered 2020-08-30: 4 mg via INTRAVENOUS
  Filled 2020-08-30: qty 2

## 2020-08-30 MED ORDER — HYDROMORPHONE HCL 1 MG/ML IJ SOLN
0.5000 mg | INTRAMUSCULAR | Status: DC | PRN
Start: 2020-08-30 — End: 2020-09-07

## 2020-08-30 MED ORDER — PROPOFOL 10 MG/ML IV BOLUS
INTRAVENOUS | Status: AC
  Filled 2020-08-30: qty 20

## 2020-08-30 MED ORDER — MIDAZOLAM HCL 2 MG/2ML IJ SOLN
INTRAMUSCULAR | Status: AC
  Filled 2020-08-30: qty 2

## 2020-08-30 MED ORDER — LIDOCAINE HCL (PF) 2 % IJ SOLN
INTRAMUSCULAR | Status: AC
  Filled 2020-08-30: qty 5

## 2020-08-30 MED ORDER — SODIUM CHLORIDE 0.9 % IV BOLUS
500.0000 mL | Freq: Once | INTRAVENOUS | Status: AC
  Administered 2020-08-30: 500 mL via INTRAVENOUS

## 2020-08-30 MED ORDER — METOPROLOL TARTRATE 5 MG/5ML IV SOLN
INTRAVENOUS | Status: DC | PRN
  Administered 2020-08-30: 3 mg via INTRAVENOUS
  Administered 2020-08-30: 2 mg via INTRAVENOUS

## 2020-08-30 MED ORDER — IOHEXOL 300 MG/ML  SOLN
80.0000 mL | Freq: Once | INTRAMUSCULAR | Status: AC | PRN
  Administered 2020-08-30: 80 mL via INTRAVENOUS

## 2020-08-30 MED ORDER — ROCURONIUM BROMIDE 10 MG/ML (PF) SYRINGE
PREFILLED_SYRINGE | INTRAVENOUS | Status: AC
  Filled 2020-08-30: qty 10

## 2020-08-30 MED ORDER — METOPROLOL TARTRATE 5 MG/5ML IV SOLN: 5.0000 mg | Freq: Four times a day (QID) | INTRAVENOUS | Status: DC

## 2020-08-30 MED ORDER — DILTIAZEM HCL-DEXTROSE 125-5 MG/125ML-% IV SOLN (PREMIX)
5.0000 mg/h | INTRAVENOUS | Status: DC
  Administered 2020-08-30: 5 mg/h via INTRAVENOUS
  Administered 2020-08-31: 15 mg/h via INTRAVENOUS
  Administered 2020-08-31: 10 mg/h via INTRAVENOUS
  Administered 2020-09-01 – 2020-09-02 (×2): 5 mg/h via INTRAVENOUS
  Administered 2020-09-03: 12.5 mg/h via INTRAVENOUS
  Filled 2020-08-30 (×9): qty 125

## 2020-08-30 MED ORDER — ONDANSETRON HCL 4 MG/2ML IJ SOLN
4.0000 mg | Freq: Once | INTRAMUSCULAR | Status: DC | PRN
Start: 2020-08-30 — End: 2020-08-30

## 2020-08-30 MED ORDER — ONDANSETRON HCL 4 MG/2ML IJ SOLN
INTRAMUSCULAR | Status: AC
  Filled 2020-08-30: qty 2

## 2020-08-30 MED ORDER — DEXAMETHASONE SODIUM PHOSPHATE 10 MG/ML IJ SOLN
INTRAMUSCULAR | Status: AC
  Filled 2020-08-30: qty 1

## 2020-08-30 MED ORDER — FENTANYL CITRATE (PF) 100 MCG/2ML IJ SOLN: 25.0000 ug | INTRAMUSCULAR | Status: DC | PRN

## 2020-08-30 MED ORDER — DILTIAZEM HCL 50 MG/10ML IV SOLN
15.0000 mg | Freq: Once | INTRAVENOUS | Status: AC
  Administered 2020-08-30: 15 mg via INTRAVENOUS
  Filled 2020-08-30: qty 3

## 2020-08-30 MED ORDER — MIDAZOLAM HCL 2 MG/2ML IJ SOLN
INTRAMUSCULAR | Status: DC | PRN
  Administered 2020-08-30: 2 mg via INTRAVENOUS

## 2020-08-30 MED ORDER — PHENYLEPHRINE HCL (PRESSORS) 10 MG/ML IV SOLN
INTRAVENOUS | Status: DC | PRN
  Administered 2020-08-30 (×2): 100 ug via INTRAVENOUS

## 2020-08-30 MED ORDER — DEXMEDETOMIDINE HCL 200 MCG/2ML IV SOLN
INTRAVENOUS | Status: DC | PRN
  Administered 2020-08-30: 8 ug via INTRAVENOUS

## 2020-08-30 MED ORDER — SODIUM CHLORIDE 0.9 % IV SOLN
INTRAVENOUS | Status: DC | PRN
  Administered 2020-08-30: 60 mL

## 2020-08-30 MED ORDER — MORPHINE SULFATE (PF) 4 MG/ML IV SOLN: 4.0000 mg | Freq: Once | INTRAVENOUS | Status: AC

## 2020-08-30 MED ORDER — LIDOCAINE HCL (CARDIAC) PF 100 MG/5ML IV SOSY
PREFILLED_SYRINGE | INTRAVENOUS | Status: DC | PRN
  Administered 2020-08-30: 100 mg via INTRAVENOUS
  Administered 2020-08-30: 80 mg via INTRAVENOUS

## 2020-08-30 MED ORDER — ONDANSETRON HCL 4 MG/2ML IJ SOLN: 4.0000 mg | Freq: Four times a day (QID) | INTRAMUSCULAR | Status: DC | PRN

## 2020-08-30 MED ORDER — DILTIAZEM HCL 25 MG/5ML IV SOLN
30.0000 mg | Freq: Once | INTRAVENOUS | Status: AC
  Administered 2020-08-30: 30 mg via INTRAVENOUS
  Filled 2020-08-30: qty 10

## 2020-08-30 MED ORDER — SUCCINYLCHOLINE CHLORIDE 20 MG/ML IJ SOLN
INTRAMUSCULAR | Status: DC | PRN
  Administered 2020-08-30: 120 mg via INTRAVENOUS

## 2020-08-30 MED ORDER — ONDANSETRON 4 MG PO TBDP
4.0000 mg | ORAL_TABLET | Freq: Four times a day (QID) | ORAL | Status: DC | PRN
  Filled 2020-08-30: qty 1

## 2020-08-30 MED ORDER — VASOPRESSIN 20 UNIT/ML IV SOLN
INTRAVENOUS | Status: DC | PRN
  Administered 2020-08-30 (×4): 2 [IU] via INTRAVENOUS

## 2020-08-30 MED ORDER — MORPHINE SULFATE (PF) 4 MG/ML IV SOLN
6.0000 mg | Freq: Once | INTRAVENOUS | Status: AC
  Administered 2020-08-30: 6 mg via INTRAVENOUS
  Filled 2020-08-30: qty 2

## 2020-08-30 MED ORDER — IOHEXOL 300 MG/ML  SOLN: 100.0000 mL | Freq: Once | INTRAMUSCULAR | Status: DC | PRN

## 2020-08-30 MED ORDER — ALBUMIN HUMAN 5 % IV SOLN: INTRAVENOUS | Status: DC | PRN

## 2020-08-30 MED ORDER — LACTATED RINGERS IV BOLUS
1000.0000 mL | Freq: Once | INTRAVENOUS | Status: AC
  Administered 2020-08-30: 1000 mL via INTRAVENOUS

## 2020-08-30 MED ORDER — PANTOPRAZOLE SODIUM 40 MG IV SOLR
40.0000 mg | Freq: Every day | INTRAVENOUS | Status: DC
  Administered 2020-08-30 – 2020-09-06 (×8): 40 mg via INTRAVENOUS
  Filled 2020-08-30 (×8): qty 40

## 2020-08-30 MED ORDER — ONDANSETRON HCL 4 MG/2ML IJ SOLN: 4.0000 mg | Freq: Once | INTRAMUSCULAR | Status: AC

## 2020-08-30 MED ORDER — SODIUM CHLORIDE 0.9 % IV SOLN: INTRAVENOUS | Status: DC

## 2020-08-30 MED ORDER — SUGAMMADEX SODIUM 200 MG/2ML IV SOLN
INTRAVENOUS | Status: DC | PRN
  Administered 2020-08-30: 200 mg via INTRAVENOUS

## 2020-08-30 MED ORDER — VASOPRESSIN 20 UNIT/ML IV SOLN
INTRAVENOUS | Status: AC
  Filled 2020-08-30: qty 1

## 2020-08-30 MED ORDER — ONDANSETRON HCL 4 MG/2ML IJ SOLN
INTRAMUSCULAR | Status: DC | PRN
  Administered 2020-08-30: 4 mg via INTRAVENOUS

## 2020-08-30 MED ORDER — LACTATED RINGERS IV SOLN: 125.0000 mL/h | INTRAVENOUS | Status: DC

## 2020-08-30 MED ORDER — PIPERACILLIN-TAZOBACTAM 3.375 G IVPB 30 MIN
3.3750 g | Freq: Once | INTRAVENOUS | Status: AC
  Administered 2020-08-30: 3.375 g via INTRAVENOUS
  Filled 2020-08-30: qty 50

## 2020-08-30 MED ORDER — DEXAMETHASONE SODIUM PHOSPHATE 10 MG/ML IJ SOLN
INTRAMUSCULAR | Status: DC | PRN
  Administered 2020-08-30: 10 mg via INTRAVENOUS

## 2020-08-30 MED ORDER — SODIUM CHLORIDE 0.9 % IV SOLN
INTRAVENOUS | Status: DC | PRN
  Administered 2020-08-30: 50 ug/min via INTRAVENOUS

## 2020-08-30 MED ORDER — SUCCINYLCHOLINE CHLORIDE 200 MG/10ML IV SOSY
PREFILLED_SYRINGE | INTRAVENOUS | Status: AC
  Filled 2020-08-30: qty 10

## 2020-08-30 MED ORDER — ROCURONIUM BROMIDE 100 MG/10ML IV SOLN
INTRAVENOUS | Status: DC | PRN
  Administered 2020-08-30: 20 mg via INTRAVENOUS
  Administered 2020-08-30: 10 mg via INTRAVENOUS
  Administered 2020-08-30: 50 mg via INTRAVENOUS

## 2020-08-30 MED ORDER — PROPOFOL 10 MG/ML IV BOLUS
INTRAVENOUS | Status: DC | PRN
  Administered 2020-08-30: 100 mg via INTRAVENOUS

## 2020-08-30 MED ORDER — ALBUMIN HUMAN 5 % IV SOLN
INTRAVENOUS | Status: AC
  Filled 2020-08-30: qty 250

## 2020-08-30 MED ORDER — IOHEXOL 9 MG/ML PO SOLN
1000.0000 mL | Freq: Once | ORAL | Status: AC
  Administered 2020-08-30: 1000 mL via ORAL

## 2020-08-30 MED ORDER — ACETAMINOPHEN 10 MG/ML IV SOLN
INTRAVENOUS | Status: DC | PRN
  Administered 2020-08-30: 1000 mg via INTRAVENOUS

## 2020-08-30 MED ORDER — ACETAMINOPHEN 10 MG/ML IV SOLN
INTRAVENOUS | Status: AC
  Filled 2020-08-30: qty 100

## 2020-08-30 MED ORDER — MORPHINE SULFATE (PF) 4 MG/ML IV SOLN
INTRAVENOUS | Status: AC
  Administered 2020-08-30: 4 mg via INTRAVENOUS
  Filled 2020-08-30: qty 1

## 2020-08-30 MED ORDER — POLYETHYLENE GLYCOL 3350 17 G PO PACK: 17.0000 g | PACK | Freq: Every day | ORAL | Status: DC | PRN

## 2020-08-30 SURGICAL SUPPLY — 50 items
APL PRP STRL LF DISP 70% ISPRP (MISCELLANEOUS) ×1
BRR ADH 6X5 SEPRAFILM 1 SHT (MISCELLANEOUS)
BULB RESERV EVAC DRAIN JP 100C (MISCELLANEOUS) ×1 IMPLANT
CANISTER SUCT 1200ML W/VALVE (MISCELLANEOUS) ×2 IMPLANT
CHLORAPREP W/TINT 26 (MISCELLANEOUS) ×2 IMPLANT
COVER WAND RF STERILE (DRAPES) ×2 IMPLANT
DRAIN CHANNEL JP 19F (MISCELLANEOUS) ×1 IMPLANT
DRAPE LAPAROTOMY 100X77 ABD (DRAPES) ×2 IMPLANT
DRSG OPSITE POSTOP 4X12 (GAUZE/BANDAGES/DRESSINGS) ×1 IMPLANT
DRSG TEGADERM 4X10 (GAUZE/BANDAGES/DRESSINGS) IMPLANT
DRSG TEGADERM 4X4.75 (GAUZE/BANDAGES/DRESSINGS) ×1 IMPLANT
ELECT BLADE 6.5 EXT (BLADE) ×2 IMPLANT
ELECT CAUTERY BLADE 6.4 (BLADE) ×2 IMPLANT
ELECT REM PT RETURN 9FT ADLT (ELECTROSURGICAL) ×2
ELECTRODE REM PT RTRN 9FT ADLT (ELECTROSURGICAL) ×1 IMPLANT
GAUZE SPONGE 4X4 12PLY STRL (GAUZE/BANDAGES/DRESSINGS) ×2 IMPLANT
GLOVE SURG SYN 7.0 (GLOVE) ×4 IMPLANT
GLOVE SURG SYN 7.0 PF PI (GLOVE) ×2 IMPLANT
GLOVE SURG SYN 7.5  E (GLOVE) ×4
GLOVE SURG SYN 7.5 E (GLOVE) ×2 IMPLANT
GLOVE SURG SYN 7.5 PF PI (GLOVE) ×2 IMPLANT
GOWN STRL REUS W/ TWL LRG LVL3 (GOWN DISPOSABLE) ×4 IMPLANT
GOWN STRL REUS W/TWL LRG LVL3 (GOWN DISPOSABLE) ×8
HANDLE SUCTION POOLE (INSTRUMENTS) IMPLANT
LABEL OR SOLS (LABEL) ×2 IMPLANT
LIGASURE IMPACT 36 18CM CVD LR (INSTRUMENTS) ×1 IMPLANT
MANIFOLD NEPTUNE II (INSTRUMENTS) ×2 IMPLANT
NEEDLE HYPO 22GX1.5 SAFETY (NEEDLE) ×2 IMPLANT
NS IRRIG 1000ML POUR BTL (IV SOLUTION) ×5 IMPLANT
PACK BASIN MAJOR ARMC (MISCELLANEOUS) ×2 IMPLANT
PACK COLON CLEAN CLOSURE (MISCELLANEOUS) ×2 IMPLANT
RELOAD PROXIMATE 75MM BLUE (ENDOMECHANICALS) ×8 IMPLANT
RELOAD STAPLE 75 3.8 BLU REG (ENDOMECHANICALS) IMPLANT
SEPRAFILM MEMBRANE 5X6 (MISCELLANEOUS) IMPLANT
SPONGE DRAIN TRACH 4X4 STRL 2S (GAUZE/BANDAGES/DRESSINGS) ×1 IMPLANT
STAPLER PROXIMATE 75MM BLUE (STAPLE) ×1 IMPLANT
STAPLER SKIN PROX 35W (STAPLE) ×2 IMPLANT
SUCTION POOLE HANDLE (INSTRUMENTS) ×2
SUT ETHILON 3-0 FS-10 30 BLK (SUTURE) ×2
SUT PDS AB 1 CT1 36 (SUTURE) ×3 IMPLANT
SUT PROLENE 2 0 SH DA (SUTURE) IMPLANT
SUT SILK 2 0 (SUTURE) ×2
SUT SILK 2-0 18XBRD TIE 12 (SUTURE) ×1 IMPLANT
SUT SILK 3-0 (SUTURE) ×5 IMPLANT
SUT VIC AB 3-0 SH 27 (SUTURE) ×2
SUT VIC AB 3-0 SH 27X BRD (SUTURE) ×1 IMPLANT
SUTURE EHLN 3-0 FS-10 30 BLK (SUTURE) IMPLANT
SYR 10ML LL (SYRINGE) ×2 IMPLANT
SYR 20ML LL LF (SYRINGE) IMPLANT
TRAY FOLEY MTR SLVR 16FR STAT (SET/KITS/TRAYS/PACK) ×2 IMPLANT

## 2020-08-30 NOTE — H&P (Signed)
Date of Admission:  08/30/2020  Reason for Admission:  Cecal volvulus  History of Present Illness: Henry Hill is a 60 y.o. male presenting with a 5 day history of nausea, vomiting and 3 day history of worsening abdominal pain.  He has been unable to keep solid po intake, but was able to drink some fluids.  However, given the worsening pain, he presented today to the ER for evaluation.  In the ER, he has remained tachycardic, with abdominal distention.  CXR showed elevation of the right diaphragm with concern for free air underneath.  CT scan was obtained which showed significant distention of the stomach and small bowel, with transition point at the right colon, in the location of cecal volvulus.  He is dehydrated, in AKI with Cr of 1.8, acidotic with lactic acid of 4.3, with Na of 125.  WBC 11.8.  He had NG tube placed and has so far drained about 2.5 liters of gastric/enteric contents.  Denies any fevers, chills, chest pain, shortness of breath.  No flatus or BM recently.  Past Medical History: Past Medical History:  Diagnosis Date  . Essential hypertension   . Varicose veins of both lower extremities      Past Surgical History: Past Surgical History:  Procedure Laterality Date  . BACK SURGERY  2003  . HEMORRHOID SURGERY N/A 11/13/2014   Procedure: LEFT LATERAL HEMORRHOIDECTOMY  ;  Surgeon: [name], MD;  Location: [facility];  Service: General;  Laterality: N/A;    Home Medications: Prior to Admission medications   Medication Sig Start Date End Date Taking? Authorizing Provider  NIFEdipine (ADALAT CC) 60 MG 24 hr tablet TAKE 1 TABLET BY MOUTH EVERY DAY 09/11/19  Yes Chrismon, Vickki Muff, PA-C  acetaminophen (TYLENOL) 500 MG tablet Take 1,000 mg by mouth every 6 (six) hours as needed for mild pain, moderate pain or headache.    [provider]  aspirin EC 81 MG tablet Take 81 mg by mouth daily. Patient not taking: Reported on 08/30/2020    [provider]  becaplermin  (REGRANEX) 0.01 % gel Apply 1 application topically daily. Patient not taking: Reported on 08/30/2020 08/27/20   Just, Laurita Quint, FNP  collagenase (SANTYL) ointment Apply 1 application topically daily. Patient not taking: Reported on 08/30/2020 08/27/20   Just, Laurita Quint, FNP  Control Gel Formula Dressing (DUODERM CGF DRESSING) MISC Apply patch to ulcer on left ankle and change once a week. 01/28/20   Chrismon, Vickki Muff, PA-C  Multiple Vitamins-Minerals (MULTIVITAMIN ADULT PO) Take 1 tablet by mouth daily. Patient not taking: Reported on 08/30/2020    [provider]  sildenafil (VIAGRA) 50 MG tablet TAKE 1 TABLET BY MOUTH DAILY AS NEEDED FOR ERECTILE DYSFUNCTION 07/31/20   Chrismon, Vickki Muff, PA-C    Allergies: No Known Allergies  Social History:  reports that he has been smoking cigarettes. He has been smoking about 0.75 packs per day. He has never used smokeless tobacco. He reports current alcohol use of about 20.0 standard drinks of alcohol per week. He reports that he does not use drugs.   Family History: Family History  Problem Relation Age of Onset  . Heart attack Mother   . Diabetes Father   . Hypertension Father     Review of Systems: Review of Systems  Constitutional: Negative for chills and fever.  HENT: Negative for hearing loss.   Respiratory: Negative for shortness of breath.   Cardiovascular: Negative for chest pain.  Gastrointestinal: Positive for abdominal  pain, nausea and vomiting. Negative for constipation and diarrhea.  Genitourinary: Negative for dysuria.  Musculoskeletal: Negative for myalgias.  Skin: Negative for rash.  Neurological: Negative for dizziness.  Psychiatric/Behavioral: Negative for depression.    Physical Exam BP 101/70   Pulse 62   Temp 97.9 F (36.6 C) (Oral)   Resp (!) 28   Ht 6\' 1"  (1.854 m)   Wt 89.8 kg   SpO2 93%   BMI 26.12 kg/m  CONSTITUTIONAL:  Appears uncomfortable. HEENT:  Normocephalic, atraumatic, extraocular motion  intact. NECK: Trachea is midline, and there is no jugular venous distension.  RESPIRATORY:  Lungs are clear, and breath sounds are equal bilaterally. Normal respiratory effort without pathologic use of accessory muscles. CARDIOVASCULAR: Tachycardic, sinus rhythm GI: The abdomen is soft, distended, with tenderness to palpation mostly focused in the right abdomen.  Currently not peritoneal, but his pain was worse prior to NG tube placement. NG currently in place to suction, with dark enteric contents in canister. MUSCULOSKELETAL:  Normal muscle strength and tone in all four extremities.  No peripheral edema or cyanosis. SKIN: Skin turgor is normal. There are no pathologic skin lesions.  NEUROLOGIC:  Motor and sensation is grossly normal.  Cranial nerves are grossly intact. PSYCH:  Alert and oriented to person, place and time. Affect is normal.  Laboratory Analysis: Results for orders placed or performed during the hospital encounter of 08/30/20 (from the past 24 hour(s))  CBC with Differential/Platelet     Status: Abnormal   Collection Time: 08/30/20  9:07 AM  Result Value Ref Range   WBC 11.8 (H) 4.0 - 10.5 K/uL   RBC 4.77 4.22 - 5.81 MIL/uL   Hemoglobin 16.1 13.0 - 17.0 g/dL   HCT 43.8 39.0 - 52.0 %   MCV 91.8 80.0 - 100.0 fL   MCH 33.8 26.0 - 34.0 pg   MCHC 36.8 (H) 30.0 - 36.0 g/dL   RDW 13.2 11.5 - 15.5 %   Platelets 326 150 - 400 K/uL   nRBC 0.0 0.0 - 0.2 %   Neutrophils Relative % 78 %   Neutro Abs 9.2 (H) 1.7 - 7.7 K/uL   Lymphocytes Relative 8 %   Lymphs Abs 1.0 0.7 - 4.0 K/uL   Monocytes Relative 13 %   Monocytes Absolute 1.6 (H) 0.1 - 1.0 K/uL   Eosinophils Relative 0 %   Eosinophils Absolute 0.0 0.0 - 0.5 K/uL   Basophils Relative 1 %   Basophils Absolute 0.1 0.0 - 0.1 K/uL   WBC Morphology MILD LEFT SHIFT (1-5% METAS, OCC MYELO, OCC BANDS)    RBC Morphology MORPHOLOGY UNREMARKABLE    Smear Review Normal platelet morphology    Immature Granulocytes 0 %   Abs Immature  Granulocytes 0.03 0.00 - 0.07 K/uL  Comprehensive metabolic panel     Status: Abnormal   Collection Time: 08/30/20  9:07 AM  Result Value Ref Range   Sodium 125 (L) 135 - 145 mmol/L   Potassium 3.3 (L) 3.5 - 5.1 mmol/L   Chloride 75 (L) 98 - 111 mmol/L   CO2 28 22 - 32 mmol/L   Glucose, Bld 177 (H) 70 - 99 mg/dL   BUN 72 (H) 6 - 20 mg/dL   Creatinine, Ser 1.62 (H) 0.61 - 1.24 mg/dL   Calcium 9.9 8.9 - 10.3 mg/dL   Total Protein 8.0 6.5 - 8.1 g/dL   Albumin 4.6 3.5 - 5.0 g/dL   AST 54 (H) 15 - 41 U/L   ALT 55 (  H) 0 - 44 U/L   Alkaline Phosphatase 54 38 - 126 U/L   Total Bilirubin 1.5 (H) 0.3 - 1.2 mg/dL   GFR, Estimated 48 (L) >60 mL/min   Anion gap 22 (H) 5 - 15  Troponin I (High Sensitivity)     Status: Abnormal   Collection Time: 08/30/20  9:07 AM  Result Value Ref Range   Troponin I (High Sensitivity) 33 (H) <18 ng/L  Magnesium     Status: None   Collection Time: 08/30/20  9:07 AM  Result Value Ref Range   Magnesium 2.2 1.7 - 2.4 mg/dL  Protime-INR     Status: None   Collection Time: 08/30/20  9:07 AM  Result Value Ref Range   Prothrombin Time 12.3 11.4 - 15.2 seconds   INR 0.9 0.8 - 1.2  Type and screen [place]     Status: None   Collection Time: 08/30/20  9:08 AM  Result Value Ref Range   ABO/RH(D) O POS    Antibody Screen NEG    Sample Expiration      09/02/2020,2359 Performed at [facility], [address] [place], [place] [address]   Resp Panel by RT-PCR (Flu A&B, Covid) Nasopharyngeal Swab     Status: None   Collection Time: 08/30/20  9:08 AM   Specimen: Nasopharyngeal Swab; Nasopharyngeal(NP) swabs in vial transport medium  Result Value Ref Range   SARS Coronavirus 2 by RT PCR NEGATIVE NEGATIVE   Influenza A by PCR NEGATIVE NEGATIVE   Influenza B by PCR NEGATIVE NEGATIVE  Lactic acid, plasma     Status: Abnormal   Collection Time: 08/30/20  9:09 AM  Result Value Ref Range   Lactic Acid, Venous 4.3 (HH) 0.5 - 1.9 mmol/L   I-stat Creatinine, ED     Status: Abnormal   Collection Time: 08/30/20 10:14 AM  Result Value Ref Range   Creatinine, Ser 1.80 (H) 0.61 - 1.24 mg/dL  Lactic acid, plasma     Status: Abnormal   Collection Time: 08/30/20 12:06 PM  Result Value Ref Range   Lactic Acid, Venous 2.2 (HH) 0.5 - 1.9 mmol/L  Troponin I (High Sensitivity)     Status: Abnormal   Collection Time: 08/30/20 12:06 PM  Result Value Ref Range   Troponin I (High Sensitivity) 36 (H) <18 ng/L    Imaging: DG Abdomen 1 View  Result Date: 08/30/2020 CLINICAL DATA:  NG tube insertion EXAM: ABDOMEN - 1 VIEW COMPARISON:  Same day CT FINDINGS: Limited radiograph of the lower chest and upper abdomen was obtained for the purposes of enteric tube localization. Enteric tube is seen coursing below the diaphragm with distal tip and side port terminating within the expected location of the gastric body. Dilated loops of large and small bowel throughout the abdomen compatible with known high-grade bowel obstruction. IMPRESSION: 1. Enteric tube tip and side port project within the expected location of the gastric body. 2. Bowel obstruction. Electronically Signed   By: Davina Poke D.O.   On: 08/30/2020 11:55   CT ABDOMEN PELVIS W CONTRAST  Result Date: 08/30/2020 CLINICAL DATA:  Diffuse abdominal distension. EXAM: CT ABDOMEN AND PELVIS WITH CONTRAST TECHNIQUE: Multidetector CT imaging of the abdomen and pelvis was performed using the standard protocol following bolus administration of intravenous contrast. CONTRAST:  12mL OMNIPAQUE IOHEXOL 300 MG/ML  SOLN COMPARISON:  None. FINDINGS: Lower chest: Small bilateral pleural effusions, left greater than right. Hepatobiliary: No focal liver abnormality is seen. No gallstones, gallbladder  wall thickening, or biliary dilatation. Pancreas: Unremarkable. No pancreatic ductal dilatation or surrounding inflammatory changes. Spleen: Normal in size without focal abnormality. Adrenals/Urinary Tract: Normal  adrenal glands. 4 mm cortical hypodensity within the posterior cortex of the left mid kidney is too small to characterize. No hydronephrosis or mass identified bilaterally. Urinary bladder is unremarkable. Stomach/Bowel: Diffuse distension of the stomach and small bowel loops identified up to the level of the cecum which is also distended. Beyond the cecum there is collapse of the ascending colon. Here, at the transition point there is a swirling pattern of collapsed bowel loop which is concerning for cecal volvulus. Vascular/Lymphatic: Aortic atherosclerosis. No aneurysm. No abdominopelvic adenopathy. Large varicosities are identified within both inguinal regions. Reproductive: Mild prostate gland enlargement. Other: No signs of pneumoperitoneum. No significant free fluid or fluid collections. Musculoskeletal: Degenerative disc disease is noted within the lumbar spine. No acute or suspicious findings. IMPRESSION: 1. Examination is positive for high-grade bowel obstruction. Transition point is just beyond the level of the cecum where a cecal volvulus is suspected. No signs of bowel perforation. Pneumoperitoneum identified 2. Small bilateral pleural effusions, left greater than right. 3. Aortic atherosclerosis. Aortic Atherosclerosis (ICD10-I70.0). Electronically Signed   By: Kerby Moors M.D.   On: 08/30/2020 11:10   DG Chest Portable 1 View  Addendum Date: 08/30/2020   ADDENDUM REPORT: 08/30/2020 09:55 ADDENDUM: Critical Value/emergent results were called by telephone at the time of interpretation on 08/30/2020 at 9:55 am to provider [facility] , who verbally acknowledged these results. Electronically Signed   By: Kerby Moors M.D.   On: 08/30/2020 09:55   Result Date: 08/30/2020 CLINICAL DATA:  Shortness of breath.  Evaluate for infiltrates. EXAM: PORTABLE CHEST 1 VIEW COMPARISON:  12/04/2015. FINDINGS: Normal heart size and mediastinal contours. Diminished lung volumes with marked asymmetric elevation  of the right hemidiaphragm. Lucency along the undersurface of both right and left hemidiaphragm noted. No pleural effusion or edema identified. No airspace densities. IMPRESSION: 1. No focal pulmonary opacities. 2. Marked asymmetric elevation of the right hemidiaphragm. Lucency along the undersurface of both right and left hemidiaphragm. Cannot rule out pneumoperitoneum. In a patient presenting with progressive abdominal pain and distension advise further investigation with CT of the abdomen and pelvis. Electronically Signed: By: Kerby Moors M.D. 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  --Discussed with the patient that there's evidence on his CT scan of cecal volvulus, and given his AKI, lactic acidosis, and pain, I think this warrants surgical exploration.  Discussed with him what a cecal volvulus is and how to treat it with surgery in the form of right colectomy.  Depending on how the tissue looks and how he's doing, may require ostomies vs possible anastomosis.  He's been getting IV fluids, was started on IV Zosyn already, and his lactic acid has improved to 2.2, but his Cr worsened to 1.8.  Troponin is stable at 36, likely from demand ischemia with his tachycardia.  Reviewed with him the risks of bleeding, infection, injury to surrounding structures, and he's willing to proceed.  Will take him to OR today urgently.  Discussed with Dr. Tamala Julian in the ER and Dr. Kayleen Memos with anesthesia.   Melvyn Neth, MD [facility]g:  [phone]

## 2020-08-30 NOTE — ED Notes (Signed)
900 ml of black liquid stool emptied from NG suction container

## 2020-08-30 NOTE — Consult Note (Addendum)
[facility] Medical Consultation  Henry Hill BTD:[id number] DOB: 05-Mar-1961 DOA: 08/30/2020 PCP: Tania Ade   Requesting physician: Dr. [name] Date of consultation: 08/30/2020 Reason for consultation: New onset Atrial fibrillation w/RVR  Impression/Recommendations Active Problems:   Cecal volvulus [facility])  Mr. Henry Hill is a 60 year old gentleman who presented with 5 days of worsening abdominal pain, nausea and vomiting.  In the ER he was noted to be in severe sepsis with tachycardia, tachypnea, leukocytosis with left shift and lactate of 4.3.  Also had AKI with creatinine of 1.62 from prior of 0.63 a year ago. CT abdomen revealed cecal volvulus and he was admitted by surgery for expiratory laparotomy with resulting right colectomy.  Postoperatively, patient was noted to be in possible SVT and had EKG with documented atrial fibrillation with RVR.  He was given 15mg  of diltiazem x2 in the PACU and hospitalist was called for consultation.   On my evaluation, patient continues to be in atrial fibrillation with RVR with rates ranging from 1 20-1 40.  He appears to be in discomfort from his NG tube but otherwise denies any chest pain, shortness of breath or palpitation.  He has history of hypertension but no other cardiac history.  He reports heavy alcohol use of 4-5 beers daily with last drink about 3 days ago.  1. New onset atrial fibrillation with RVR Most likely to be an isolated event secondary to severe sepsis and dehydration with AKI. Troponin of 33 -> 36 - >20 - likely demand ischemia  TSH within normal limits Start on IV diltiazem infusion with goal of rate less than <110 Transfer to Progressive Cardiac floor in order to start infusion Continuous telemetry  Continuous IV fluids CHA2DS2VASc Score of 1 for HTN- no anticoagulation warranted  Obtain echocardiogram in the AM  2.  Hx of Hypertension Recommend holding antihypertensives while on diltiazem infusion  3.  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 AKI Continuous IV fluids  5.  Alcohol use Pt endorsed drinking 4-5 beers daily.  Last drink about 3 days ago with no signs of withdrawal  [facility] hospitalist will followup again tomorrow. Please contact me if I can be of assistance in the meanwhile. Thank you for this consultation.  Review of Systems: Constitutional: No Weight Change, No Fever ENT/Mouth: No sore throat, No Rhinorrhea Eyes: No Eye Pain, No Vision Changes Cardiovascular: No Chest Pain, no SOB, No Edema, No Palpitations Respiratory: No Cough, No Sputum  Gastrointestinal: + Nausea, No Vomiting, No Diarrhea, No Constipation, + Pain Genitourinary: no Urinary Incontinence, Musculoskeletal: No Arthralgias, No Myalgias Skin: No Skin Lesions, No Pruritus, Neuro: no Weakness, No Numbness Psych: No Anxiety/Panic, No Depression, no decrease appetite Heme/Lymph: No Bruising, No Bleeding  Past Medical History:  Diagnosis Date  . Essential hypertension   . Varicose veins of both lower extremities    Past Surgical History:  Procedure Laterality Date  . BACK SURGERY  2003  . HEMORRHOID SURGERY N/A 11/13/2014   Procedure: LEFT LATERAL HEMORRHOIDECTOMY  ;  Surgeon: [name], MD;  Location: [facility] ORS;  Service: General;  Laterality: N/A;   Social History:  reports that he has been smoking cigarettes. He has been smoking about 0.75 packs per day. He has never used smokeless tobacco. He reports current alcohol use of about 20.0 standard drinks of alcohol per week. He reports that he does not use drugs.  No Known Allergies Family History  Problem Relation Age  of Onset  . Heart attack Mother   . Diabetes Father   . Hypertension Father     Prior to Admission medications   Medication Sig Start Date End Date Taking? Authorizing Provider  NIFEdipine (ADALAT CC) 60 MG 24 hr tablet TAKE 1 TABLET BY MOUTH EVERY DAY 09/11/19  Yes Chrismon,  Vickki Muff, PA-C  acetaminophen (TYLENOL) 500 MG tablet Take 1,000 mg by mouth every 6 (six) hours as needed for mild pain, moderate pain or headache.    [provider]  aspirin EC 81 MG tablet Take 81 mg by mouth daily. Patient not taking: Reported on 08/30/2020    [provider]  becaplermin (REGRANEX) 0.01 % gel Apply 1 application topically daily. Patient not taking: Reported on 08/30/2020 08/27/20   Just, Laurita Quint, FNP  collagenase (SANTYL) ointment Apply 1 application topically daily. Patient not taking: Reported on 08/30/2020 08/27/20   Just, Laurita Quint, FNP  Control Gel Formula Dressing (DUODERM CGF DRESSING) MISC Apply patch to ulcer on left ankle and change once a week. 01/28/20   Chrismon, Vickki Muff, PA-C  Multiple Vitamins-Minerals (MULTIVITAMIN ADULT PO) Take 1 tablet by mouth daily. Patient not taking: Reported on 08/30/2020    [provider]  sildenafil (VIAGRA) 50 MG tablet TAKE 1 TABLET BY MOUTH DAILY AS NEEDED FOR ERECTILE DYSFUNCTION 07/31/20   Chrismon, Vickki Muff, PA-C   Physical Exam: Blood pressure 106/68, pulse 81, temperature 98.2 F (36.8 C), temperature source Oral, resp. rate 20, height 6\' 1"  (1.854 m), weight 89.8 kg, SpO2 94 %. Vitals:   08/30/20 2154 08/30/20 2300  BP: 118/86 106/68  Pulse: 76 81  Resp: 18 20  Temp: 98.2 F (36.8 C)   SpO2: 94%    Constitutional: Nontoxic appearing elderly male laying flat in bed in some discomfort Eyes: PERRL, lids and conjunctivae normal ENMT: Mucous membranes are moist.  NG tube in place. Neck: normal, supple Respiratory: clear to auscultation bilaterally, no wheezing, no crackles. Normal respiratory effort. No accessory muscle use.  Cardiovascular: Regular rate and rhythm, no murmurs / rubs / gallops.    No lower extremity edema.   Abdomen:  Large midline surgical incision with clean dressing.  Tenderness to minimal palpation. Musculoskeletal: no clubbing / cyanosis. No joint deformity upper and lower  extremities. Good ROM, no contractures. Normal muscle tone.  Skin: no rashes, lesions, ulcers. No induration Neurologic: CN 2-12 grossly intact. Sensation intact,  Strength 5/5 in all 4.  Psychiatric:  Alert and oriented x 3.  Normal mood.  Labs on Admission:  Basic Metabolic Panel: Recent Labs  Lab 08/30/20 0907 08/30/20 1014 08/30/20 2110  NA 125*  --  127*  K 3.3*  --  3.4*  CL 75*  --  88*  CO2 28  --  27  GLUCOSE 177*  --  127*  BUN 72*  --  52*  CREATININE 1.62* 1.80* 1.15  CALCIUM 9.9  --  7.8*  MG 2.2  --  1.8   Liver Function Tests: Recent Labs  Lab 08/30/20 0907  AST 54*  ALT 55*  ALKPHOS 54  BILITOT 1.5*  PROT 8.0  ALBUMIN 4.6   No results for input(s): LIPASE, AMYLASE in the last 168 hours. No results for input(s): AMMONIA in the last 168 hours. CBC: Recent Labs  Lab 08/30/20 0907  WBC 11.8*  NEUTROABS 9.2*  HGB 16.1  HCT 43.8  MCV 91.8  PLT 326   Cardiac Enzymes: No results for input(s): CKTOTAL, CKMB, CKMBINDEX,  TROPONINI in the last 168 hours. BNP: Invalid input(s): POCBNP CBG: No results for input(s): GLUCAP in the last 168 hours.  Radiological Exams on Admission: DG Abdomen 1 View  Result Date: 08/30/2020 CLINICAL DATA:  NG tube insertion EXAM: ABDOMEN - 1 VIEW COMPARISON:  Same day CT FINDINGS: Limited radiograph of the lower chest and upper abdomen was obtained for the purposes of enteric tube localization. Enteric tube is seen coursing below the diaphragm with distal tip and side port terminating within the expected location of the gastric body. Dilated loops of large and small bowel throughout the abdomen compatible with known high-grade bowel obstruction. IMPRESSION: 1. Enteric tube tip and side port project within the expected location of the gastric body. 2. Bowel obstruction. Electronically Signed   By: Davina Poke D.O.   On: 08/30/2020 11:55   CT ABDOMEN PELVIS W CONTRAST  Result Date: 08/30/2020 CLINICAL DATA:  Diffuse  abdominal distension. EXAM: CT ABDOMEN AND PELVIS WITH CONTRAST TECHNIQUE: Multidetector CT imaging of the abdomen and pelvis was performed using the standard protocol following bolus administration of intravenous contrast. CONTRAST:  43mL OMNIPAQUE IOHEXOL 300 MG/ML  SOLN COMPARISON:  None. FINDINGS: Lower chest: Small bilateral pleural effusions, left greater than right. Hepatobiliary: No focal liver abnormality is seen. No gallstones, gallbladder wall thickening, or biliary dilatation. Pancreas: Unremarkable. No pancreatic ductal dilatation or surrounding inflammatory changes. Spleen: Normal in size without focal abnormality. Adrenals/Urinary Tract: Normal adrenal glands. 4 mm cortical hypodensity within the posterior cortex of the left mid kidney is too small to characterize. No hydronephrosis or mass identified bilaterally. Urinary bladder is unremarkable. Stomach/Bowel: Diffuse distension of the stomach and small bowel loops identified up to the level of the cecum which is also distended. Beyond the cecum there is collapse of the ascending colon. Here, at the transition point there is a swirling pattern of collapsed bowel loop which is concerning for cecal volvulus. Vascular/Lymphatic: Aortic atherosclerosis. No aneurysm. No abdominopelvic adenopathy. Large varicosities are identified within both inguinal regions. Reproductive: Mild prostate gland enlargement. Other: No signs of pneumoperitoneum. No significant free fluid or fluid collections. Musculoskeletal: Degenerative disc disease is noted within the lumbar spine. No acute or suspicious findings. IMPRESSION: 1. Examination is positive for high-grade bowel obstruction. Transition point is just beyond the level of the cecum where a cecal volvulus is suspected. No signs of bowel perforation. Pneumoperitoneum identified 2. Small bilateral pleural effusions, left greater than right. 3. Aortic atherosclerosis. Aortic Atherosclerosis (ICD10-I70.0).  Electronically Signed   By: Kerby Moors M.D.   On: 08/30/2020 11:10   DG Chest Portable 1 View  Addendum Date: 08/30/2020   ADDENDUM REPORT: 08/30/2020 09:55 ADDENDUM: Critical Value/emergent results were called by telephone at the time of interpretation on 08/30/2020 at 9:55 am to provider [facility] , who verbally acknowledged these results. Electronically Signed   By: Kerby Moors M.D.   On: 08/30/2020 09:55   Result Date: 08/30/2020 CLINICAL DATA:  Shortness of breath.  Evaluate for infiltrates. EXAM: PORTABLE CHEST 1 VIEW COMPARISON:  12/04/2015. FINDINGS: Normal heart size and mediastinal contours. Diminished lung volumes with marked asymmetric elevation of the right hemidiaphragm. Lucency along the undersurface of both right and left hemidiaphragm noted. No pleural effusion or edema identified. No airspace densities. IMPRESSION: 1. No focal pulmonary opacities. 2. Marked asymmetric elevation of the right hemidiaphragm. Lucency along the undersurface of both right and left hemidiaphragm. Cannot rule out pneumoperitoneum. In a patient presenting with progressive abdominal pain and distension advise further investigation with  CT of the abdomen and pelvis. Electronically Signed: By: Kerby Moors M.D. On: 08/30/2020 09:49    EKG: Independently reviewed.  Atrial fibrillation with RVR.  Time spent: Greater than 45 minutes was spent reviewing documentation, lab, imaging, evaluation of patient at bedside and coordination of care.  Orene Desanctis [facility]   If 7PM-7AM, please contact night-coverage [email or web address] Password [facility] 08/30/2020, 11:44 PM

## 2020-08-30 NOTE — ED Notes (Signed)
1200 ml of dark stool emptied from NG tube suction container.

## 2020-08-30 NOTE — Progress Notes (Signed)
Pharmacy Antibiotic Note  Henry Hill is a 60 y.o. male with PMH for HTN presenting with a 5 day history of nausea and vomiting, and 3 day history of worsening abdominal pain. Pharmacy has been consulted for Zosyn dosing for intra-abdominal infection.  WBC 11.8, lactic acid 4.3>2.2, afebrile; Scr 1.80  4/23 CT abdomen: for high-grade bowel obstruction. No signs of bowel perforation. Pneumoperitoneum identified. Small bilateral pleural effusions, left greater than right.  Plan: Zosyn 3.375g IV q8h (4 hour infusion).   Monitor renal function and adjust dose as clinically indicated  Height: 6\' 1"  (185.4 cm) Weight: 89.8 kg (198 lb) IBW/kg (Calculated) : 79.9  Temp (24hrs), Avg:97.9 F (36.6 C), Min:97.9 F (36.6 C), Max:97.9 F (36.6 C)  Recent Labs  Lab 08/30/20 0907 08/30/20 0909 08/30/20 1014 08/30/20 1206  WBC 11.8*  --   --   --   CREATININE 1.62*  --  1.80*  --   LATICACIDVEN  --  4.3*  --  2.2*    Estimated Creatinine Clearance: 49.3 mL/min (A) (by C-G formula based on SCr of 1.8 mg/dL (H)).    No Known Allergies  Antimicrobials this admission: 4/23 Zosyn >>  Dose adjustments this admission:   Microbiology results: 4/23 BCx: sent  Thank you for allowing pharmacy to be a part of this patient's care.  Sherilyn Banker, PharmD Pharmacy Resident  08/30/2020 1:18 PM

## 2020-08-30 NOTE — ED Notes (Signed)
Pt at CT

## 2020-08-30 NOTE — Op Note (Signed)
Procedure Date:  08/30/2020  Pre-operative Diagnosis:   Cecal volvulus  Post-operative Diagnosis:   Cecal volvulus  Procedure:  Exploratory laparotomy, Right Colectomy.  Surgeon:  Melvyn Neth, MD  Assistant:  Signa Kell, PA-S  Anesthesia:  General endotracheal  Estimated Blood Loss:  50 ml  Drainage:  3.5 L of enteric contents were drained into NG tube.  Specimens:  Right colon with terminal ileum  Complications:  None  Indications for Procedure:  This is a 60 y.o. male who presents with cecal volvulus resulting in peritonitis and small bowel obstruction.  The benefits, complications, treatment options, and expected outcomes were discussed with the patient. The risks of bleeding, infection, bowel injury, and need for further procedures were all discussed with the patient and he was willing to proceed.  Description of Procedure: The patient was correctly identified in the preoperative area and brought into the operating room.  The patient was placed supine with VTE prophylaxis in place.  Appropriate time-outs were performed.  Anesthesia was induced and the patient was intubated.  Foley catheter was placed.  Appropriate antibiotics were infused.  The abdomen was prepped and draped in a sterile fashion. A midline incision was made and cautery was used to dissect down the subcutaneous tissue to the fascia.  The fascia was incised careful to lift it away from the small bowel and then the incision was extended superiorly and inferiorly.  The abdomen was then explored.  The small bowel was significantly distended throughout, but there was no necrosis.  The cecum had volvulized, pointing upwards and twisted around the distal ascending colon.  The cecum was very distended with thin wall but there was no evidence of ischemic or necrotic changes.  The cecum was then reduced back in place.  The small bowel was then fully milked retrograde towards the stomach and NG tube that was in place.   Overall, about 3.5 L of enteric contents were drained that way.  We started mobilizing the right colon along the white line of Toldt including portion of the terminal ileum and the appendix.  We carried the dissection and mobilization of the colon to the hepatic flexure.  Then we started mobilizing the hepatic flexure off the attachments that it had to the liver and gallbladder.  A combination of LigaSure and blunt dissection was used for all this mobilization.  We were able to visualize the duodenum and prevent any injury.  We picked spots for both proximal and distal transection and windows were created in the mesentery.  Both small bowel and transverse colon were transected using blue load GIA stapler.  Then, LigaSure was used to cautery and transect across the mesentery, staying close to the colon wall.  The right ureter was visualized and preserved during this process.  The specimen was then taken off the field and sent to pathology.  Then the blind ends of our staple lines were put together and lined up at the antimesenteric borders and secured in place using two 3-0 silk sutures.  Corners of the staple lines were cut using cautery and a GIA 75 mm blue load stapler was used to create a common channel between terminal ileum and transverse colon.  The same stapler was then used to close the opening of the common channel.  3-0 silk sutures were used to imbricate the staple line for protection.  Also a 3-0 silk suture was used to close the mesenteric defect.  Afterwards the abdominal cavity was thoroughly irrigated using warm saline,  and a 19 Fr. Blake drain was placed via the RLQ going to the pelvis and towards the anastomosis.    We then scrubbed out and rescrubbed for our clean closure.  60 ml of Exparel solution with 0.25% bupivacaine with epi was infiltrated onto the peritoneum, fascia, and subcutaneous tissue.  The midline incision was then closed using #1 PDS suture x2.  The wound was then irrigated and  closed using stapler.  The drain was secured using 3-0 Nylon suture.  The incision was dressed with Honeycomb dressing and drain with 4x4 gauze and TegaDerm.   The patient was emerged from anesthesia and extubated and brought to the recovery room for further management.   The patient tolerated the procedure well and all counts were correct at the end of the case.    Melvyn Neth, MD

## 2020-08-30 NOTE — Transfer of Care (Signed)
Immediate Anesthesia Transfer of Care Note  Patient: Henry Hill  Procedure(s) Performed: EXPLORATORY LAPAROTOMY WITH RIGHT COLOCTOMY (Right )  Patient Location: PACU  Anesthesia Type:General  Level of Consciousness: sedated  Airway & Oxygen Therapy: Patient Spontanous Breathing and Patient connected to face mask oxygen  Post-op Assessment: Report given to RN and Post -op Vital signs reviewed and stable  Post vital signs: Reviewed and stable  Last Vitals:  Vitals Value Taken Time  BP 142/85 08/30/20 1800  Temp 36.2 C 08/30/20 1745  Pulse 130 08/30/20 1801  Resp 25 08/30/20 1801  SpO2 85 % 08/30/20 1801  Vitals shown include unvalidated device data.  Last Pain:  Vitals:   08/30/20 1745  TempSrc:   PainSc: 0-No pain         Complications: No complications documented.

## 2020-08-30 NOTE — Progress Notes (Signed)
Cross Cover Patient on cardizem infusion. Rate remains 150-170, appropriate weight based loading dose of cardizem ordered and continue to titrate up on cardizem to keep rate just below 100  ordered fluid bolus as well for bp support with bolus. BP currently on lower side of normal

## 2020-08-30 NOTE — Anesthesia Preprocedure Evaluation (Addendum)
Anesthesia Evaluation  Patient identified by MRN, date of birth, ID band Patient awake    Reviewed: Allergy & Precautions, H&P , NPO status , Patient's Chart, lab work & pertinent test results  Airway Mallampati: II  TM Distance: >3 FB Neck ROM: Full    Dental no notable dental hx. (+) Teeth Intact, Dental Advisory Given   Pulmonary Current Smoker,    Pulmonary exam normal breath sounds clear to auscultation       Cardiovascular hypertension, Pt. on medications + Peripheral Vascular Disease   Rhythm:Regular Rate:Normal  Varicose veins   Neuro/Psych PSYCHIATRIC DISORDERS Anxiety negative neurological ROS     GI/Hepatic Neg liver ROS, GI obstruction   Endo/Other  negative endocrine ROS  Renal/GU negative Renal ROS  negative genitourinary   Musculoskeletal  (+) Arthritis , Osteoarthritis,    Abdominal   Peds negative pediatric ROS (+)  Hematology negative hematology ROS (+)   Anesthesia Other Findings Past Medical History: No date: Essential hypertension No date: Varicose veins of both lower extremities  Reproductive/Obstetrics negative OB ROS                             Anesthesia Physical  Anesthesia Plan  ASA: III and emergent  Anesthesia Plan: General   Post-op Pain Management:    Induction: Intravenous, Cricoid pressure planned and Rapid sequence  PONV Risk Score and Plan:   Airway Management Planned: Oral ETT and Video Laryngoscope Planned  Additional Equipment:   Intra-op Plan:   Post-operative Plan: Extubation in OR and Possible Post-op intubation/ventilation  Informed Consent: I have reviewed the patients History and Physical, chart, labs and discussed the procedure including the risks, benefits and alternatives for the proposed anesthesia with the patient or authorized representative who has indicated his/her understanding and acceptance.     Dental advisory  given  Plan Discussed with: CRNA  Anesthesia Plan Comments: (Patient aware of all risks discussed and understanding that we need to proceed with surgery,  With possible post op ventilation.)       Anesthesia Quick Evaluation

## 2020-08-30 NOTE — ED Notes (Addendum)
Pt presents to ED with c/o fo ABD pain since Wednesday. Pt presents with discoloration to lower legs, forehead, and nose and pt and family states this is normal for him. Pt states low grade fever of 99 at home, pt afebrile here. Pt states he drinks about 5-7 beers a day for "a lot of years". Pt presents with ascites and swelling to ABD area and pt states intermittent nausea, ABD also firm on palpation. Pt denies diarrhea. Pt denies any known liver issues at this time. Pt states last BM was "probably Tuesday". Pt is A&Ox4 at this time.

## 2020-08-30 NOTE — Anesthesia Procedure Notes (Signed)
Procedure Name: Intubation Date/Time: 08/30/2020 2:08 PM Performed by: Hedda Slade, CRNA Pre-anesthesia Checklist: Patient identified, Patient being monitored, Timeout performed, Emergency Drugs available and Suction available Patient Re-evaluated:Patient Re-evaluated prior to induction Oxygen Delivery Method: Circle system utilized Preoxygenation: Pre-oxygenation with 100% oxygen Induction Type: IV induction, Cricoid Pressure applied and Rapid sequence Laryngoscope Size: 3 and McGraph Grade View: Grade I Tube type: Oral Tube size: 7.5 mm Number of attempts: 1 Airway Equipment and Method: Stylet and Video-laryngoscopy Placement Confirmation: ETT inserted through vocal cords under direct vision,  positive ETCO2 and breath sounds checked- equal and bilateral Secured at: 21 cm Tube secured with: Tape Dental Injury: Teeth and Oropharynx as per pre-operative assessment

## 2020-08-30 NOTE — Plan of Care (Signed)

## 2020-08-30 NOTE — Consult Note (Incomplete)
[facility] Medical Consultation  Henry Hill RJJ:[id number] DOB: 05-20-1960 DOA: 08/30/2020 PCP: Tania Ade   Requesting physician: Dr. [name] Date of consultation: 08/30/2020 Reason for consultation: New onset Atrial fibrillation w/RVR  Impression/Recommendations Active Problems:   Cecal volvulus [facility])  Henry Hill is a 60 year old gentleman who presented with 5 days of worsening abdominal pain, nausea and vomiting.  In the ER he was noted to be in severe sepsis with tachycardia, tachypnea, leukocytosis with left shift and lactate of 4.3.  Also had AKI with creatinine of 1.62 from prior of 0.63 a year ago. CT abdomen revealed cecal volvulus and he was admitted by surgery for expiratory laparotomy with resulting right colectomy. S/P extubation patient was noted to be in possible SVT and had EKG with documented atrial fibrillation with RVR.  He was given 15mg  of diltiazem x2 in the PACU and hospitalist was called for consultation.   On my evaluation, patient continues to be in atrial fibrillation with RVR with rates ranging from 1 20-1 40.  He appears to be in discomfort from his NG tube but otherwise denies any chest pain, shortness of breath or palpitation.  He has history of hypertension but no other cardiac history.  He reports heavy alcohol use of 4-5 beers daily with last drink about 3 days ago.  1. New onset atrial fibrillation with RVR Most likely to be an isolated event secondary to severe sepsis and dehydration with AKI. Troponin of 33 -> 36 - >20 - likely demand ischemia  TSH within normal limits Start on IV diltiazem infusion with goal of rate less than <110 Transfer to Progressive Cardiac floor in order to start infusion Continuous telemetry  Continuous IV fluids [id number] Score of 1 for HTN- no anticoagulation warranted  Obtain echocardiogram in the AM  2.  Hypertension Recommend holding antihypertensives while on diltiazem infusion  3. 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 AKI Continuous IV fluids  [facility] hospitalist will followup again tomorrow. Please contact me if I can be of assistance in the meanwhile. Thank you for this consultation.  Review of Systems: Constitutional: No Weight Change, No Fever ENT/Mouth: No sore throat, No Rhinorrhea Eyes: No Eye Pain, No Vision Changes Cardiovascular: No Chest Pain, no SOB, No Edema, No Palpitations Respiratory: No Cough, No Sputum  Gastrointestinal: + Nausea, No Vomiting, No Diarrhea, No Constipation, + Pain Genitourinary: no Urinary Incontinence, Musculoskeletal: No Arthralgias, No Myalgias Skin: No Skin Lesions, No Pruritus, Neuro: no Weakness, No Numbness Psych: No Anxiety/Panic, No Depression, no decrease appetite Heme/Lymph: No Bruising, No Bleeding  Past Medical History:  Diagnosis Date  . Essential hypertension   . Varicose veins of both lower extremities    Past Surgical History:  Procedure Laterality Date  . BACK SURGERY  2003  . HEMORRHOID SURGERY N/A 11/13/2014   Procedure: LEFT LATERAL HEMORRHOIDECTOMY  ;  Surgeon: [name], MD;  Location: [facility]S;  Service: General;  Laterality: N/A;   Social History:  reports that he has been smoking cigarettes. He has been smoking about 0.75 packs per day. He has never used smokeless tobacco. He reports current alcohol use of about 20.0 standard drinks of alcohol per week. He reports that he does not use drugs.  No Known Allergies Family History  Problem Relation Age of Onset  . Heart attack Mother   . Diabetes Father   . Hypertension Father     Prior to Admission medications  Medication Sig Start Date End Date Taking? Authorizing Provider  NIFEdipine (ADALAT CC) 60 MG 24 hr tablet TAKE 1 TABLET BY MOUTH EVERY DAY 09/11/19  Yes Chrismon, Vickki Muff, PA-C  acetaminophen (TYLENOL) 500 MG tablet Take 1,000 mg by mouth every 6 (six) hours as needed for mild pain,  moderate pain or headache.    [provider]  aspirin EC 81 MG tablet Take 81 mg by mouth daily. Patient not taking: Reported on 08/30/2020    [provider]  becaplermin (REGRANEX) 0.01 % gel Apply 1 application topically daily. Patient not taking: Reported on 08/30/2020 08/27/20   Just, Laurita Quint, FNP  collagenase (SANTYL) ointment Apply 1 application topically daily. Patient not taking: Reported on 08/30/2020 08/27/20   Just, Laurita Quint, FNP  Control Gel Formula Dressing (DUODERM CGF DRESSING) MISC Apply patch to ulcer on left ankle and change once a week. 01/28/20   Chrismon, Vickki Muff, PA-C  Multiple Vitamins-Minerals (MULTIVITAMIN ADULT PO) Take 1 tablet by mouth daily. Patient not taking: Reported on 08/30/2020    [provider]  sildenafil (VIAGRA) 50 MG tablet TAKE 1 TABLET BY MOUTH DAILY AS NEEDED FOR ERECTILE DYSFUNCTION 07/31/20   Chrismon, Vickki Muff, PA-C   Physical Exam: Blood pressure 106/68, pulse 81, temperature 98.2 F (36.8 C), temperature source Oral, resp. rate 20, height 6\' 1"  (1.854 m), weight 89.8 kg, SpO2 94 %. Vitals:   08/30/20 2154 08/30/20 2300  BP: 118/86 106/68  Pulse: 76 81  Resp: 18 20  Temp: 98.2 F (36.8 C)   SpO2: 94%      General:  ***  Eyes: ***  ENT: ***  Neck: ***  Cardiovascular: ***  Respiratory: ***  Abdomen: ***  Skin: ***  Musculoskeletal: ***  Psychiatric: ***  Neurologic: ***  Labs on Admission:  Basic Metabolic Panel: Recent Labs  Lab 08/30/20 0907 08/30/20 1014 08/30/20 2110  NA 125*  --  127*  K 3.3*  --  3.4*  CL 75*  --  88*  CO2 28  --  27  GLUCOSE 177*  --  127*  BUN 72*  --  52*  CREATININE 1.62* 1.80* 1.15  CALCIUM 9.9  --  7.8*  MG 2.2  --  1.8   Liver Function Tests: Recent Labs  Lab 08/30/20 0907  AST 54*  ALT 55*  ALKPHOS 54  BILITOT 1.5*  PROT 8.0  ALBUMIN 4.6   No results for input(s): LIPASE, AMYLASE in the last 168 hours. No results for input(s): AMMONIA in the  last 168 hours. CBC: Recent Labs  Lab 08/30/20 0907  WBC 11.8*  NEUTROABS 9.2*  HGB 16.1  HCT 43.8  MCV 91.8  PLT 326   Cardiac Enzymes: No results for input(s): CKTOTAL, CKMB, CKMBINDEX, TROPONINI in the last 168 hours. BNP: Invalid input(s): POCBNP CBG: No results for input(s): GLUCAP in the last 168 hours.  Radiological Exams on Admission: DG Abdomen 1 View  Result Date: 08/30/2020 CLINICAL DATA:  NG tube insertion EXAM: ABDOMEN - 1 VIEW COMPARISON:  Same day CT FINDINGS: Limited radiograph of the lower chest and upper abdomen was obtained for the purposes of enteric tube localization. Enteric tube is seen coursing below the diaphragm with distal tip and side port terminating within the expected location of the gastric body. Dilated loops of large and small bowel throughout the abdomen compatible with known high-grade bowel obstruction. IMPRESSION: 1. Enteric tube tip and side port project within the expected location of the gastric body.  2. Bowel obstruction. Electronically Signed   By: Davina Poke D.O.   On: 08/30/2020 11:55   CT ABDOMEN PELVIS W CONTRAST  Result Date: 08/30/2020 CLINICAL DATA:  Diffuse abdominal distension. EXAM: CT ABDOMEN AND PELVIS WITH CONTRAST TECHNIQUE: Multidetector CT imaging of the abdomen and pelvis was performed using the standard protocol following bolus administration of intravenous contrast. CONTRAST:  84mL OMNIPAQUE IOHEXOL 300 MG/ML  SOLN COMPARISON:  None. FINDINGS: Lower chest: Small bilateral pleural effusions, left greater than right. Hepatobiliary: No focal liver abnormality is seen. No gallstones, gallbladder wall thickening, or biliary dilatation. Pancreas: Unremarkable. No pancreatic ductal dilatation or surrounding inflammatory changes. Spleen: Normal in size without focal abnormality. Adrenals/Urinary Tract: Normal adrenal glands. 4 mm cortical hypodensity within the posterior cortex of the left mid kidney is too small to characterize.  No hydronephrosis or mass identified bilaterally. Urinary bladder is unremarkable. Stomach/Bowel: Diffuse distension of the stomach and small bowel loops identified up to the level of the cecum which is also distended. Beyond the cecum there is collapse of the ascending colon. Here, at the transition point there is a swirling pattern of collapsed bowel loop which is concerning for cecal volvulus. Vascular/Lymphatic: Aortic atherosclerosis. No aneurysm. No abdominopelvic adenopathy. Large varicosities are identified within both inguinal regions. Reproductive: Mild prostate gland enlargement. Other: No signs of pneumoperitoneum. No significant free fluid or fluid collections. Musculoskeletal: Degenerative disc disease is noted within the lumbar spine. No acute or suspicious findings. IMPRESSION: 1. Examination is positive for high-grade bowel obstruction. Transition point is just beyond the level of the cecum where a cecal volvulus is suspected. No signs of bowel perforation. Pneumoperitoneum identified 2. Small bilateral pleural effusions, left greater than right. 3. Aortic atherosclerosis. Aortic Atherosclerosis (ICD10-I70.0). Electronically Signed   By: Kerby Moors M.D.   On: 08/30/2020 11:10   DG Chest Portable 1 View  Addendum Date: 08/30/2020   ADDENDUM REPORT: 08/30/2020 09:55 ADDENDUM: Critical Value/emergent results were called by telephone at the time of interpretation on 08/30/2020 at 9:55 am to provider [facility] , who verbally acknowledged these results. Electronically Signed   By: Kerby Moors M.D.   On: 08/30/2020 09:55   Result Date: 08/30/2020 CLINICAL DATA:  Shortness of breath.  Evaluate for infiltrates. EXAM: PORTABLE CHEST 1 VIEW COMPARISON:  12/04/2015. FINDINGS: Normal heart size and mediastinal contours. Diminished lung volumes with marked asymmetric elevation of the right hemidiaphragm. Lucency along the undersurface of both right and left hemidiaphragm noted. No pleural effusion  or edema identified. No airspace densities. IMPRESSION: 1. No focal pulmonary opacities. 2. Marked asymmetric elevation of the right hemidiaphragm. Lucency along the undersurface of both right and left hemidiaphragm. Cannot rule out pneumoperitoneum. In a patient presenting with progressive abdominal pain and distension advise further investigation with CT of the abdomen and pelvis. Electronically Signed: By: Kerby Moors M.D. On: 08/30/2020 09:49    EKG: Independently reviewed. ***  Time spent: ***  Ortencia Askari T Kaz Auld [facility]   If 7PM-7AM, please contact night-coverage [email or web address] Password [facility] 08/30/2020, 11:44 PM

## 2020-08-30 NOTE — ED Provider Notes (Signed)
[facility] Emergency Department Provider Note ____________________________________________   Event Date/Time   First MD Initiated Contact with Patient 08/30/20 [phone]     (approximate)  I have reviewed the triage vital signs and the nursing notes.  HISTORY  Chief Complaint Abdominal Pain   HPI Henry Hill is a 60 y.o. malewho presents to the ED for evaluation of abdominal pain  Chart review indicates history of hypertension and varicose veins. Patient denies ever having had intra-abdominal surgery. Patient reports drinking 5-7 beers every day for many years.   Patient presents from home via POV with his wife due to 4 days of progressively worsening abdominal distention and pain in the setting of recurrent emesis and constipation.  Reporting 11/10 pain throughout his abdomen and increasing distention.  Denies any bowel movement since Wednesday, and reports recurrent emesis since Wednesday.  Reports thinking that it would just pass, but it worsened, so he presents to the ED for evaluation.   Past Medical History:  Diagnosis Date  . Essential hypertension   . Varicose veins of both lower extremities     Patient Active Problem List   Diagnosis Date Noted  . 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 Hx of total knee replacement, left 06/16/2017  . Swelling of limb 11/23/2016  . Internal and external thrombosed hemorrhoids 11/13/2014  . Anal pain 11/08/2014  . Osteoarthritis of knee 11/01/2014  . Failure of erection 11/01/2014  . Varicose veins of leg with pain, bilateral 11/01/2014  . Hypo-osmolality and hyponatremia 10/14/2009  . Avitaminosis D 10/14/2009  . Decreased libido 09/11/2009  . Malaise and fatigue 09/11/2009  . Phobia 05/16/2007  . Essential (primary) hypertension 02/04/2006    Past Surgical History:  Procedure Laterality Date  . BACK SURGERY  2003  . HEMORRHOID SURGERY N/A 11/13/2014   Procedure: LEFT LATERAL HEMORRHOIDECTOMY  ;  Surgeon:  [name], MD;  Location: [facility] ORS;  Service: General;  Laterality: N/A;    Prior to Admission medications   Medication Sig Start Date End Date Taking? Authorizing Provider  NIFEdipine (ADALAT CC) 60 MG 24 hr tablet TAKE 1 TABLET BY MOUTH EVERY DAY 09/11/19  Yes Chrismon, Vickki Muff, PA-C  acetaminophen (TYLENOL) 500 MG tablet Take 1,000 mg by mouth every 6 (six) hours as needed for mild pain, moderate pain or headache.    [provider]  aspirin EC 81 MG tablet Take 81 mg by mouth daily. Patient not taking: Reported on 08/30/2020    [provider]  becaplermin (REGRANEX) 0.01 % gel Apply 1 application topically daily. Patient not taking: Reported on 08/30/2020 08/27/20   Just, Laurita Quint, FNP  collagenase (SANTYL) ointment Apply 1 application topically daily. Patient not taking: Reported on 08/30/2020 08/27/20   Just, Laurita Quint, FNP  Control Gel Formula Dressing (DUODERM CGF DRESSING) MISC Apply patch to ulcer on left ankle and change once a week. 01/28/20   Chrismon, Vickki Muff, PA-C  Multiple Vitamins-Minerals (MULTIVITAMIN ADULT PO) Take 1 tablet by mouth daily. Patient not taking: Reported on 08/30/2020    [provider]  sildenafil (VIAGRA) 50 MG tablet TAKE 1 TABLET BY MOUTH DAILY AS NEEDED FOR ERECTILE DYSFUNCTION 07/31/20   Chrismon, Vickki Muff, PA-C    Allergies Patient has no known allergies.  Family History  Problem Relation Age of Onset  . Heart attack Mother   . Diabetes Father   . Hypertension Father     Social History Social History   Tobacco Use  . Smoking  status: Current Every Day Smoker    Packs/day: 0.75    Types: Cigarettes  . Smokeless tobacco: Never Used  Substance Use Topics  . Alcohol use: Yes    Alcohol/week: 20.0 standard drinks    Types: 20 Cans of beer per week    Comment: MODERATE USE  . Drug use: No    Review of Systems  Constitutional: No fever/chills Eyes: No visual changes. ENT: No sore throat. Cardiovascular: Denies  chest pain. Respiratory: Denies shortness of breath. Gastrointestinal: Positive for abdominal pain, distention, nausea and vomiting.  Positive for constipation. Genitourinary: Negative for dysuria. Musculoskeletal: Negative for back pain. Skin: Negative for rash. Neurological: Negative for headaches, focal weakness or numbness.  ____________________________________________   PHYSICAL EXAM:  VITAL SIGNS: Vitals:   08/30/20 1230 08/30/20 1245  BP: (!) 104/92 101/70  Pulse: (!) 54 62  Resp: (!) 23 (!) 28  Temp:    SpO2: 90% 93%    Constitutional: Alert and oriented.  Obviously uncomfortable.  Answers questions appropriately.  Speaks in phrases only. Eyes: Conjunctivae are normal. PERRL. EOMI. Head: Atraumatic. Nose: No congestion/rhinnorhea. Mouth/Throat: Mucous membranes are dry.  Oropharynx non-erythematous. Neck: No stridor. No cervical spine tenderness to palpation. Cardiovascular: Tachycardic rate, regular rhythm. Grossly normal heart sounds.  Good peripheral circulation. Respiratory: Normal respiratory effort.  No retractions. Lungs CTAB. Gastrointestinal: Firm but not tense, distended and diffusely tender with involuntary guarding. Musculoskeletal: Quite prominent varicose veins and venous congestion bilaterally, wife reports this is about normal.  No signs of trauma to the lower extremities. Neurologic:  Normal speech and language. No gross focal neurologic deficits are appreciated. No gait instability noted. Skin:  Skin is warm, dry and intact. No rash noted. Psychiatric: Mood and affect are normal. Speech and behavior are normal.  ____________________________________________   LABS (all labs ordered are listed, but only abnormal results are displayed)  Labs Reviewed  CBC WITH DIFFERENTIAL/PLATELET - Abnormal; Notable for the following components:      Result Value   WBC 11.8 (*)    MCHC 36.8 (*)    Neutro Abs 9.2 (*)    Monocytes Absolute 1.6 (*)    All other  components within normal limits  COMPREHENSIVE METABOLIC PANEL - Abnormal; Notable for the following components:   Sodium 125 (*)    Potassium 3.3 (*)    Chloride 75 (*)    Glucose, Bld 177 (*)    BUN 72 (*)    Creatinine, Ser 1.62 (*)    AST 54 (*)    ALT 55 (*)    Total Bilirubin 1.5 (*)    GFR, Estimated 48 (*)    Anion gap 22 (*)    All other components within normal limits  LACTIC ACID, PLASMA - Abnormal; Notable for the following components:   Lactic Acid, Venous 4.3 (*)    All other components within normal limits  LACTIC ACID, PLASMA - Abnormal; Notable for the following components:   Lactic Acid, Venous 2.2 (*)    All other components within normal limits  I-STAT CREATININE, ED - Abnormal; Notable for the following components:   Creatinine, Ser 1.80 (*)    All other components within normal limits  TROPONIN I (HIGH SENSITIVITY) - Abnormal; Notable for the following components:   Troponin I (High Sensitivity) 33 (*)    All other components within normal limits  TROPONIN I (HIGH SENSITIVITY) - Abnormal; Notable for the following components:   Troponin I (High Sensitivity) 36 (*)    All other  components within normal limits  RESP PANEL BY RT-PCR (FLU A&B, COVID) ARPGX2  CULTURE, BLOOD (SINGLE)  MAGNESIUM  PROTIME-INR  URINALYSIS, COMPLETE (UACMP) WITH MICROSCOPIC  HIV ANTIBODY (ROUTINE TESTING W REFLEX)  TYPE AND SCREEN   ____________________________________________  12 Lead EKG   ____________________________________________  RADIOLOGY  ED MD interpretation:    Official radiology report(s): DG Abdomen 1 View  Result Date: 08/30/2020 CLINICAL DATA:  NG tube insertion EXAM: ABDOMEN - 1 VIEW COMPARISON:  Same day CT FINDINGS: Limited radiograph of the lower chest and upper abdomen was obtained for the purposes of enteric tube localization. Enteric tube is seen coursing below the diaphragm with distal tip and side port terminating within the expected location of  the gastric body. Dilated loops of large and small bowel throughout the abdomen compatible with known high-grade bowel obstruction. IMPRESSION: 1. Enteric tube tip and side port project within the expected location of the gastric body. 2. Bowel obstruction. Electronically Signed   By: Davina Poke D.O.   On: 08/30/2020 11:55   CT ABDOMEN PELVIS W CONTRAST  Result Date: 08/30/2020 CLINICAL DATA:  Diffuse abdominal distension. EXAM: CT ABDOMEN AND PELVIS WITH CONTRAST TECHNIQUE: Multidetector CT imaging of the abdomen and pelvis was performed using the standard protocol following bolus administration of intravenous contrast. CONTRAST:  67mL OMNIPAQUE IOHEXOL 300 MG/ML  SOLN COMPARISON:  None. FINDINGS: Lower chest: Small bilateral pleural effusions, left greater than right. Hepatobiliary: No focal liver abnormality is seen. No gallstones, gallbladder wall thickening, or biliary dilatation. Pancreas: Unremarkable. No pancreatic ductal dilatation or surrounding inflammatory changes. Spleen: Normal in size without focal abnormality. Adrenals/Urinary Tract: Normal adrenal glands. 4 mm cortical hypodensity within the posterior cortex of the left mid kidney is too small to characterize. No hydronephrosis or mass identified bilaterally. Urinary bladder is unremarkable. Stomach/Bowel: Diffuse distension of the stomach and small bowel loops identified up to the level of the cecum which is also distended. Beyond the cecum there is collapse of the ascending colon. Here, at the transition point there is a swirling pattern of collapsed bowel loop which is concerning for cecal volvulus. Vascular/Lymphatic: Aortic atherosclerosis. No aneurysm. No abdominopelvic adenopathy. Large varicosities are identified within both inguinal regions. Reproductive: Mild prostate gland enlargement. Other: No signs of pneumoperitoneum. No significant free fluid or fluid collections. Musculoskeletal: Degenerative disc disease is noted within  the lumbar spine. No acute or suspicious findings. IMPRESSION: 1. Examination is positive for high-grade bowel obstruction. Transition point is just beyond the level of the cecum where a cecal volvulus is suspected. No signs of bowel perforation. Pneumoperitoneum identified 2. Small bilateral pleural effusions, left greater than right. 3. Aortic atherosclerosis. Aortic Atherosclerosis (ICD10-I70.0). Electronically Signed   By: Kerby Moors M.D.   On: 08/30/2020 11:10   DG Chest Portable 1 View  Addendum Date: 08/30/2020   ADDENDUM REPORT: 08/30/2020 09:55 ADDENDUM: Critical Value/emergent results were called by telephone at the time of interpretation on 08/30/2020 at 9:55 am to provider [facility] , who verbally acknowledged these results. Electronically Signed   By: Kerby Moors M.D.   On: 08/30/2020 09:55   Result Date: 08/30/2020 CLINICAL DATA:  Shortness of breath.  Evaluate for infiltrates. EXAM: PORTABLE CHEST 1 VIEW COMPARISON:  12/04/2015. FINDINGS: Normal heart size and mediastinal contours. Diminished lung volumes with marked asymmetric elevation of the right hemidiaphragm. Lucency along the undersurface of both right and left hemidiaphragm noted. No pleural effusion or edema identified. No airspace densities. IMPRESSION: 1. No focal pulmonary opacities. 2. Marked  asymmetric elevation of the right hemidiaphragm. Lucency along the undersurface of both right and left hemidiaphragm. Cannot rule out pneumoperitoneum. In a patient presenting with progressive abdominal pain and distension advise further investigation with CT of the abdomen and pelvis. Electronically Signed: By: Kerby Moors M.D. On: 08/30/2020 09:49    ____________________________________________   PROCEDURES and INTERVENTIONS  Procedure(s) performed (including Critical Care):  .1-3 Lead EKG Interpretation Performed by: Vladimir Crofts, MD Authorized by: Vladimir Crofts, MD     Interpretation: abnormal     ECG rate:  120    ECG rate assessment: tachycardic     Rhythm: sinus tachycardia     Ectopy: none     Conduction: normal   .Critical Care Performed by: Vladimir Crofts, MD Authorized by: Vladimir Crofts, MD   Critical care provider statement:    Critical care time (minutes):  45   Critical care was necessary to treat or prevent imminent or life-threatening deterioration of the following conditions:  Metabolic crisis and sepsis   Critical care was time spent personally by me on the following activities:  Discussions with consultants, evaluation of patient's response to treatment, examination of patient, ordering and performing treatments and interventions, ordering and review of laboratory studies, ordering and review of radiographic studies, pulse oximetry, re-evaluation of patient's condition, obtaining history from patient or surrogate and review of old charts    Medications  HYDROmorphone (DILAUDID) injection 0.5 mg (has no administration in time range)  polyethylene glycol (MIRALAX / GLYCOLAX) packet 17 g (has no administration in time range)  ondansetron (ZOFRAN-ODT) disintegrating tablet 4 mg (has no administration in time range)    Or  ondansetron (ZOFRAN) injection 4 mg (has no administration in time range)  pantoprazole (PROTONIX) injection 40 mg (has no administration in time range)  ketorolac (TORADOL) 15 MG/ML injection 15 mg (has no administration in time range)  piperacillin-tazobactam (ZOSYN) IVPB 3.375 g (has no administration in time range)  0.9 %  sodium chloride infusion (has no administration in time range)  morphine 4 MG/ML injection 6 mg (6 mg Intravenous Given 08/30/20 0919)  ondansetron (ZOFRAN) injection 4 mg (4 mg Intravenous Given 08/30/20 0921)  lactated ringers bolus 1,000 mL (0 mLs Intravenous Stopped 08/30/20 1037)  iohexol (OMNIPAQUE) 9 MG/ML oral solution 1,000 mL (1,000 mLs Oral Contrast Given 08/30/20 0918)  piperacillin-tazobactam (ZOSYN) IVPB 3.375 g (0 g Intravenous Stopped  08/30/20 1108)  iohexol (OMNIPAQUE) 300 MG/ML solution 80 mL (80 mLs Intravenous Contrast Given 08/30/20 1020)  morphine 4 MG/ML injection 4 mg (4 mg Intravenous Given 08/30/20 1108)  ondansetron (ZOFRAN) injection 4 mg (4 mg Intravenous Given 08/30/20 1109)    ____________________________________________   MDM / ED COURSE   59 year old male without intra-abdominal surgical history presents to the ED with 3-4 days of progressively worsening pain, emesis and constipation with evidence of SBO caused by cecal volvulus requiring surgical admission for ex lap.  Presents tachycardic but hemodynamically stable.  Exam with rather distended abdomen that is firm without significant tension.  He has significant guarding and tenderness throughout.  FAST exam without evidence of free fluid.  Blood work with lactic acidosis and metabolic derangements as above.  Provided empiric Zosyn for intra-abdominal coverage of possible sepsis.  CXR with elevated diaphragm and concern for free air, CT similarly concerning with evidence of SBO caused by cecal volvulus..  Lactic acidosis improving with resuscitation and surgery plans to take the patient to the OR straight from the ED for ex lap.   Clinical Course  as of 08/30/20 1337  Sat Aug 30, 2020  0921 Quick bedside ultrasound of his abdomen.  Most concerned about SBO at this point.  Fast is negative and I see dilated loops of bowel [DS]  5638 Call from rads recommending CT abd after cxr [DS]  [phone] Updated patient and wife of my concerns.  Patient surgery. [DS]  1004 I discussed the case with Dr. Hampton Abbot, who reviewed chest x-ray imaging.  He is about to start a case for a gallbladder and requested call back at the patient becomes unstable.  He reports that he will visualize a CT as soon as he is able to and evaluate the patient after the case, unless I call back expressing instability of the patient. [DS]  3329 NG tube in place, low intermittent wall suction draining black  gastric contents. [DS]  1258 Dr. Hampton Abbot has evaluated the patient and recommended ex lap for small bowel obstruction.  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   Vladimir Crofts, MD 08/30/20 [phone]

## 2020-08-31 ENCOUNTER — Encounter: Payer: Self-pay | Admitting: Surgery

## 2020-08-31 ENCOUNTER — Inpatient Hospital Stay: Payer: Self-pay

## 2020-08-31 ENCOUNTER — Inpatient Hospital Stay: Payer: 59

## 2020-08-31 ENCOUNTER — Inpatient Hospital Stay (HOSPITAL_COMMUNITY)
Admission: EM | Admit: 2020-08-31 | Discharge: 2020-08-31 | Disposition: A | Discharge status: Home / Self Care | Payer: 59 | Source: Home / Self Care | Attending: Family Medicine | Admitting: Family Medicine

## 2020-08-31 DIAGNOSIS — I4891 Unspecified atrial fibrillation: Secondary | ICD-10-CM

## 2020-08-31 DIAGNOSIS — N179 Acute kidney failure, unspecified: Secondary | ICD-10-CM

## 2020-08-31 DIAGNOSIS — E871 Hypo-osmolality and hyponatremia: Secondary | ICD-10-CM

## 2020-08-31 LAB — BASIC METABOLIC PANEL
Anion gap: 10 (ref 5–15)
BUN: 39 mg/dL — ABNORMAL HIGH (ref 6–20)
CO2: 27 mmol/L (ref 22–32)
Calcium: 7.6 mg/dL — ABNORMAL LOW (ref 8.9–10.3)
Chloride: 92 mmol/L — ABNORMAL LOW (ref 98–111)
Creatinine, Ser: 0.93 mg/dL (ref 0.61–1.24)
GFR, Estimated: >60 mL/min (ref >60)
Glucose, Bld: 127 mg/dL — ABNORMAL HIGH (ref 70–99)
Potassium: 3.4 mmol/L — ABNORMAL LOW (ref 3.5–5.1)
Sodium: 129 mmol/L — ABNORMAL LOW (ref 135–145)

## 2020-08-31 LAB — CBC
HCT: 33.8 % — ABNORMAL LOW (ref 39.0–52.0)
Hemoglobin: 12.4 g/dL — ABNORMAL LOW (ref 13.0–17.0)
MCH: 33.7 pg (ref 26.0–34.0)
MCHC: 36.7 g/dL — ABNORMAL HIGH (ref 30.0–36.0)
MCV: 91.8 fL (ref 80.0–100.0)
Platelets: 206 10*3/uL (ref 150–400)
RBC: 3.68 MIL/uL — ABNORMAL LOW (ref 4.22–5.81)
RDW: 13.2 % (ref 11.5–15.5)
WBC: 7.6 10*3/uL (ref 4.0–10.5)
nRBC: 0 % (ref 0.0–0.2)

## 2020-08-31 LAB — HIV ANTIBODY (ROUTINE TESTING W REFLEX): HIV Screen 4th Generation wRfx: NONREACTIVE

## 2020-08-31 LAB — GLUCOSE, CAPILLARY
Glucose-Capillary: 114 mg/dL — ABNORMAL HIGH (ref 70–99)
Glucose-Capillary: 120 mg/dL — ABNORMAL HIGH (ref 70–99)

## 2020-08-31 LAB — HEMOGLOBIN A1C
Hgb A1c MFr Bld: 5.4 % (ref 4.8–5.6)
Mean Plasma Glucose: 108.28 mg/dL

## 2020-08-31 LAB — MAGNESIUM: Magnesium: 1.9 mg/dL (ref 1.7–2.4)

## 2020-08-31 LAB — LACTIC ACID, PLASMA: Lactic Acid, Venous: 1.2 mmol/L (ref 0.5–1.9)

## 2020-08-31 MED ORDER — CHLORHEXIDINE GLUCONATE CLOTH 2 % EX PADS
6.0000 | MEDICATED_PAD | Freq: Every day | CUTANEOUS | Status: DC
  Administered 2020-09-01 – 2020-09-07 (×7): 6 via TOPICAL

## 2020-08-31 MED ORDER — M.V.I. ADULT IV INJ
INJECTION | INTRAVENOUS | Status: AC
Start: 2020-08-31 — End: 2020-09-01
  Filled 2020-08-31: qty 504

## 2020-08-31 MED ORDER — POTASSIUM CHLORIDE 10 MEQ/100ML IV SOLN
10.0000 meq | INTRAVENOUS | Status: AC
  Administered 2020-08-31 (×4): 10 meq via INTRAVENOUS
  Filled 2020-08-31 (×4): qty 100

## 2020-08-31 MED ORDER — SODIUM CHLORIDE 0.9% FLUSH
10.0000 mL | Freq: Two times a day (BID) | INTRAVENOUS | Status: DC
  Administered 2020-08-31 – 2020-09-04 (×7): 10 mL
  Administered 2020-09-04: 20 mL
  Administered 2020-09-05 – 2020-09-07 (×5): 10 mL

## 2020-08-31 MED ORDER — SODIUM CHLORIDE 0.9% FLUSH: 10.0000 mL | INTRAVENOUS | Status: DC | PRN

## 2020-08-31 MED ORDER — DILTIAZEM HCL 25 MG/5ML IV SOLN
30.0000 mg | Freq: Once | INTRAVENOUS | Status: AC
  Administered 2020-08-31: 30 mg via INTRAVENOUS
  Filled 2020-08-31: qty 10

## 2020-08-31 MED ORDER — ENOXAPARIN SODIUM 40 MG/0.4ML ~~LOC~~ SOLN
40.0000 mg | SUBCUTANEOUS | Status: DC
  Administered 2020-09-01 – 2020-09-03 (×3): 40 mg via SUBCUTANEOUS
  Filled 2020-08-31 (×5): qty 0.4

## 2020-08-31 MED ORDER — DEXTROSE-NACL 5-0.9 % IV SOLN: INTRAVENOUS | Status: DC

## 2020-08-31 MED ORDER — SODIUM CHLORIDE 0.9 % IV BOLUS
500.0000 mL | Freq: Once | INTRAVENOUS | Status: AC
  Administered 2020-08-31: 500 mL via INTRAVENOUS

## 2020-08-31 MED ORDER — INSULIN ASPART 100 UNIT/ML ~~LOC~~ SOLN
0.0000 [IU] | SUBCUTANEOUS | Status: DC
  Administered 2020-09-01 – 2020-09-02 (×5): 1 [IU] via SUBCUTANEOUS
  Administered 2020-09-02: 2 [IU] via SUBCUTANEOUS
  Administered 2020-09-02 (×3): 1 [IU] via SUBCUTANEOUS
  Administered 2020-09-03: 2 [IU] via SUBCUTANEOUS
  Administered 2020-09-03 (×2): 1 [IU] via SUBCUTANEOUS
  Administered 2020-09-03: 2 [IU] via SUBCUTANEOUS
  Administered 2020-09-03 – 2020-09-06 (×3): 1 [IU] via SUBCUTANEOUS
  Filled 2020-08-31 (×16): qty 1

## 2020-08-31 NOTE — Progress Notes (Signed)
Provider aware of HR change. Pt on Cardizem gtt to stabilize HR. Gtt rate changed to 15mg /hr.  08/31/20 0130  Assess: MEWS Score  BP 109/72  Pulse Rate (!) 115  Level of Consciousness Alert  Assess: MEWS Score  MEWS Temp 0  MEWS Systolic 0  MEWS Pulse 2  MEWS RR 0  MEWS LOC 0  MEWS Score 2  MEWS Score Color Yellow  Assess: if the MEWS score is Yellow or Red  Were vital signs taken at a resting state? Yes  Focused Assessment No change from prior assessment  Early Detection of Sepsis Score *See Row Information* Low  MEWS guidelines implemented *See Row Information* No, other (Comment) (pt on Cardizem gtt for HR stabilization)  Treat  Pain Scale 0-10  Pain Score 0  Notify: Provider  Provider Name/Title Randol Kern, NP  Date Provider Notified 08/31/20  Time Provider Notified [phone]  Notification Type Page  Notification Reason Other (Comment) (Rate change)  Provider response No new orders

## 2020-08-31 NOTE — Progress Notes (Addendum)
PHARMACY - TOTAL PARENTERAL NUTRITION CONSULT NOTE   Indication: Prolonged ileus  Patient Measurements: Height: 6\' 1"  (185.4 cm) Weight: 89.8 kg (198 lb) IBW/kg (Calculated) : 79.9 TPN AdjBW (KG): 89.8 Body mass index is 26.12 kg/m.  Assessment:  60 y.o. male presenting with a 5 day history of nausea, vomiting and 3 day history of worsening abdominal pain.  He has been unable to keep solid po intake, but was able to drink some fluids.  However, given the worsening pain, he presented today to the ER for evaluation.  In the ER, he has remained tachycardic, with abdominal distention. Pharmacy has been consulted for TPN management.   Glucose / Insulin: no current insulin orders 24h BG 127-177 Electrolytes: Na 129, K 3.4, Cl 92; all other WNL Renal: Scr 0.93 Hepatic: AST 54, ALT 55 Intake / Output; MIVF: NS @125ml /hr Net I/O = (-) 723mL GI Imaging: 4/23 CT abdomen: high grade bowel obstruction. 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  4/23 Xray abdomen: bowel obstruction GI Surgeries / Procedures:  4/23 exploratory laparotomy with right coloctomy   Central access: pending placement  TPN start date: anticipated 4/24 if PICC line placed   Nutritional Goals (per [facility] TPN Handbook): kCal: 2700 kcal/day, Protein: 110 g/day, Fluid: 2.7 L/day Goal TPN rate is 110 mL/hr (provides 110 g of protein and 2723 kcals per day)  Current Nutrition:  NPO  Plan:   Start TPN at 50 mL/hr at 1800  Electrolytes in TPN: Na 25mEq/L, K 66mEq/L, Ca 38mEq/L, Mg 43mEq/L, and Phos 54mmol/L. Cl:Ac 1:1  K 3.4 - replaced with IV KCl 53mEq x4  Change MIVF from NS to D5NS @100ml /hr  Add standard MVI and trace elements to TPN  Initiate Sensitive q4h SSI and adjust as needed   Monitor TPN labs on Mon/Thurs  PICC pending placement 4/24, if placed will start TPN tonight and decrease MIVF to 7ml/hr. If TPN not started, continue D5NS @100ml /hr. Plan discussed with nurse.   Sherilyn Banker, PharmD Pharmacy  Resident  08/31/2020 11:29 AM

## 2020-08-31 NOTE — Progress Notes (Signed)
Spoke with Sharyn Lull RN re PICC is ready to use for the TNA . Also made aware to remove all PIV's.

## 2020-08-31 NOTE — Anesthesia Postprocedure Evaluation (Signed)
Anesthesia Post Note  Patient: Henry Hill  Procedure(s) Performed: EXPLORATORY LAPAROTOMY WITH RIGHT COLOCTOMY (Right )  Patient location during evaluation: PACU Anesthesia Type: General Level of consciousness: awake and alert and oriented Pain management: pain level controlled Respiratory status: spontaneous breathing Cardiovascular status: blood pressure returned to baseline Anesthetic complications: no Comments: Patient appeared to go into Afib in PACU and at end of case documented by 12 lead EKG.  The rate was decreased with a total of 30mg  of diltiazem and hospitalist was consulted for management.   No complications documented.   Last Vitals:  Vitals:   08/31/20 0600 08/31/20 0725  BP: 103/66 101/68  Pulse:  76  Resp:  18  Temp:  36.8 C  SpO2:  95%    Last Pain:  Vitals:   08/31/20 0725  TempSrc: Oral  PainSc:                  Marleta Lapierre

## 2020-08-31 NOTE — Progress Notes (Signed)
PROGRESS NOTE    Henry Hill  [id number] DOB: 12/31/1960 DOA: 08/30/2020 PCP: Margo Common, PA-C   Assessment & Plan:   Active Problems:   Essential (primary) hypertension   Cecal volvulus (HCC)   Atrial fibrillation with RVR (HCC)   AKI (acute kidney injury) (HCC)   A. fib: w/ RVR. New onset. Continue on IV diltiazem drip until no longer NPO. CHA2DS2VASc Score of 1 so will hold off on anticoagulation. Continue on tele.   Elevated troponins: likely secondary to demand ischemia   HTN: continue on IV diltiazem drip   Cecal volvulus: s/p laparotomy with right colectomy. Management per primary surgical team   AKI: resolved   Alcohol abuse: drinks 4-5 beers daily.  Last drink about 3 days ago with no signs of withdrawal but may need CIWA protocol  Hyponatremia: continue on IVFs as per general surg   Hypokalemia: potassium repleated. Will continue to monitor   DVT prophylaxis: lovenox  Code Status: full  Family Communication: discussed pt's care w/ pt's family at bedside and answered their questions  Disposition Plan: unclear,As per primary surg team   Level of care: Progressive Cardiac   Status is: Inpatient  Remains inpatient appropriate because:Ongoing diagnostic testing needed not appropriate for outpatient work up, Unsafe d/c plan, IV treatments appropriate due to intensity of illness or inability to take PO and Inpatient level of care appropriate due to severity of illness   Dispo: The patient is from: Home              Anticipated d/c is to: as per primary surgery team               Patient currently is not medically stable to d/c.   Difficult to place patient Yes    Consultants:   hospitalist    Procedures:    Antimicrobials: zosyn   Subjective: Pt c/o malaise   Objective: Vitals:   08/31/20 0350 08/31/20 0500 08/31/20 0600 08/31/20 0725  BP: 98/78 114/70 103/66 101/68  Pulse: 74   76  Resp: 19   18  Temp: 98.4 F (36.9 C)   98.2  F (36.8 C)  TempSrc: Oral   Oral  SpO2: 94%   95%  Weight:      Height:        Intake/Output Summary (Last 24 hours) at 08/31/2020 0745 Last data filed at 08/31/2020 0701 Gross per 24 hour  Intake 2900 ml  Output 4840 ml  Net -1940 ml   Filed Weights   08/30/20 0857  Weight: 89.8 kg    Examination:  General exam: Appears calm and comfortable. NG tube in place Respiratory system: Clear to auscultation. Respiratory effort normal. Cardiovascular system: S1 & S2 +. No , rubs, gallops or clicks.  Gastrointestinal system: Abd is soft, tender to palpation, & hypoactive bowel sounds  Central nervous system: Alert and oriented. Moves all extremities  Psychiatry: Judgement and insight appear normal. Flat mood and affect    Data Reviewed: I have personally reviewed following labs and imaging studies  CBC: Recent Labs  Lab 08/30/20 0907 08/31/20 0135  WBC 11.8* 7.6  NEUTROABS 9.2*  --   HGB 16.1 12.4*  HCT 43.8 33.8*  MCV 91.8 91.8  PLT 326 [id number]   Basic Metabolic Panel: Recent Labs  Lab 08/30/20 0907 08/30/20 1014 08/30/20 2110 08/31/20 0135  NA 125*  --  127* 129*  K 3.3*  --  3.4* 3.4*  CL 75*  --  88* 92*  CO2 28  --  27 27  GLUCOSE 177*  --  127* 127*  BUN 72*  --  52* 39*  CREATININE 1.62* 1.80* 1.15 0.93  CALCIUM 9.9  --  7.8* 7.6*  MG 2.2  --  1.8 1.9   GFR: Estimated Creatinine Clearance: 95.5 mL/min (by C-G formula based on SCr of 0.93 mg/dL). Liver Function Tests: Recent Labs  Lab 08/30/20 0907  AST 54*  ALT 55*  ALKPHOS 54  BILITOT 1.5*  PROT 8.0  ALBUMIN 4.6   No results for input(s): LIPASE, AMYLASE in the last 168 hours. No results for input(s): AMMONIA in the last 168 hours. Coagulation Profile: Recent Labs  Lab 08/30/20 0907  INR 0.9   Cardiac Enzymes: No results for input(s): CKTOTAL, CKMB, CKMBINDEX, TROPONINI in the last 168 hours. BNP (last 3 results) No results for input(s): PROBNP in the last 8760 hours. HbA1C: No results  for input(s): HGBA1C in the last 72 hours. CBG: No results for input(s): GLUCAP in the last 168 hours. Lipid Profile: No results for input(s): CHOL, HDL, LDLCALC, TRIG, CHOLHDL, LDLDIRECT in the last 72 hours. Thyroid Function Tests: Recent Labs    08/30/20 2110  TSH 0.864   Anemia Panel: No results for input(s): VITAMINB12, FOLATE, FERRITIN, TIBC, IRON, RETICCTPCT in the last 72 hours. Sepsis Labs: Recent Labs  Lab 08/30/20 0909 08/30/20 1206 08/30/20 2251 08/31/20 0135  LATICACIDVEN 4.3* 2.2* 1.4 1.2    Recent Results (from the past 240 hour(s))  Resp Panel by RT-PCR (Flu A&B, Covid) Nasopharyngeal Swab     Status: None   Collection Time: 08/30/20  9:08 AM   Specimen: Nasopharyngeal Swab; Nasopharyngeal(NP) swabs in vial transport medium  Result Value Ref Range Status   SARS Coronavirus 2 by RT PCR NEGATIVE NEGATIVE Final    Comment: (NOTE) SARS-CoV-2 target nucleic acids are NOT DETECTED.  The SARS-CoV-2 RNA is generally detectable in upper respiratory specimens during the acute phase of infection. The lowest concentration of SARS-CoV-2 viral copies this assay can detect is 138 copies/mL. A negative result does not preclude SARS-Cov-2 infection and should not be used as the sole basis for treatment or other patient management decisions. A negative result may occur with  improper specimen collection/handling, submission of specimen other than nasopharyngeal swab, presence of viral mutation(s) within the areas targeted by this assay, and inadequate number of viral copies(<138 copies/mL). A negative result must be combined with clinical observations, patient history, and epidemiological information. The expected result is Negative.  Fact Sheet for Patients:  EntrepreneurPulse.com.au  Fact Sheet for Healthcare Providers:  IncredibleEmployment.be  This test is no t yet approved or cleared by the [place] FDA and  has been  authorized for detection and/or diagnosis of SARS-CoV-2 by FDA under an Emergency Use Authorization (EUA). This EUA will remain  in effect (meaning this test can be used) for the duration of the COVID-19 declaration under Section 564(b)(1) of the Act, 21 U.S.C.section 360bbb-3(b)(1), unless the authorization is terminated  or revoked sooner.       Influenza A by PCR NEGATIVE NEGATIVE Final   Influenza B by PCR NEGATIVE NEGATIVE Final    Comment: (NOTE) The Xpert Xpress SARS-CoV-2/FLU/RSV plus assay is intended as an aid in the diagnosis of influenza from Nasopharyngeal swab specimens and should not be used as a sole basis for treatment. Nasal washings and aspirates are unacceptable for Xpert Xpress SARS-CoV-2/FLU/RSV testing.  Fact Sheet for Patients: EntrepreneurPulse.com.au  Fact Sheet for Healthcare Providers: IncredibleEmployment.be  This test is not yet approved or cleared by the [place] and has been authorized for detection and/or diagnosis of SARS-CoV-2 by FDA under an Emergency Use Authorization (EUA). This EUA will remain in effect (meaning this test can be used) for the duration of the COVID-19 declaration under Section 564(b)(1) of the Act, 21 U.S.C. section 360bbb-3(b)(1), unless the authorization is terminated or revoked.  Performed at [facility], [address] [place], [place] [address]   Blood culture (single)     Status: None (Preliminary result)   Collection Time: 08/30/20  9:09 AM   Specimen: BLOOD  Result Value Ref Range Status   Specimen Description BLOOD LEFT ANTECUBITAL  Final   Special Requests   Final    BOTTLES DRAWN AEROBIC AND ANAEROBIC Blood Culture results may not be optimal due to an excessive volume of blood received in culture bottles   Culture   Final    NO GROWTH < 24 HOURS Performed at [facility], [address], [place], [place] [address]    Report Status PENDING   Incomplete         Radiology Studies: DG Abdomen 1 View  Result Date: 08/30/2020 CLINICAL DATA:  NG tube insertion EXAM: ABDOMEN - 1 VIEW COMPARISON:  Same day CT FINDINGS: Limited radiograph of the lower chest and upper abdomen was obtained for the purposes of enteric tube localization. Enteric tube is seen coursing below the diaphragm with distal tip and side port terminating within the expected location of the gastric body. Dilated loops of large and small bowel throughout the abdomen compatible with known high-grade bowel obstruction. IMPRESSION: 1. Enteric tube tip and side port project within the expected location of the gastric body. 2. Bowel obstruction. Electronically Signed   By: Davina Poke D.O.   On: 08/30/2020 11:55   CT ABDOMEN PELVIS W CONTRAST  Result Date: 08/30/2020 CLINICAL DATA:  Diffuse abdominal distension. EXAM: CT ABDOMEN AND PELVIS WITH CONTRAST TECHNIQUE: Multidetector CT imaging of the abdomen and pelvis was performed using the standard protocol following bolus administration of intravenous contrast. CONTRAST:  31mL OMNIPAQUE IOHEXOL 300 MG/ML  SOLN COMPARISON:  None. FINDINGS: Lower chest: Small bilateral pleural effusions, left greater than right. Hepatobiliary: No focal liver abnormality is seen. No gallstones, gallbladder wall thickening, or biliary dilatation. Pancreas: Unremarkable. No pancreatic ductal dilatation or surrounding inflammatory changes. Spleen: Normal in size without focal abnormality. Adrenals/Urinary Tract: Normal adrenal glands. 4 mm cortical hypodensity within the posterior cortex of the left mid kidney is too small to characterize. No hydronephrosis or mass identified bilaterally. Urinary bladder is unremarkable. Stomach/Bowel: Diffuse distension of the stomach and small bowel loops identified up to the level of the cecum which is also distended. Beyond the cecum there is collapse of the ascending colon. Here, at the transition point there is a  swirling pattern of collapsed bowel loop which is concerning for cecal volvulus. Vascular/Lymphatic: Aortic atherosclerosis. No aneurysm. No abdominopelvic adenopathy. Large varicosities are identified within both inguinal regions. Reproductive: Mild prostate gland enlargement. Other: No signs of pneumoperitoneum. No significant free fluid or fluid collections. Musculoskeletal: Degenerative disc disease is noted within the lumbar spine. No acute or suspicious findings. IMPRESSION: 1. Examination is positive for high-grade bowel obstruction. Transition point is just beyond the level of the cecum where a cecal volvulus is suspected. No signs of bowel perforation. Pneumoperitoneum identified 2. Small bilateral pleural effusions, left greater than right. 3. Aortic atherosclerosis. Aortic Atherosclerosis (ICD10-I70.0). Electronically Signed   By:  Kerby Moors M.D.   On: 08/30/2020 11:10   DG Chest Portable 1 View  Addendum Date: 08/30/2020   ADDENDUM REPORT: 08/30/2020 09:55 ADDENDUM: Critical Value/emergent results were called by telephone at the time of interpretation on 08/30/2020 at 9:55 am to provider [facility] , who verbally acknowledged these results. Electronically Signed   By: Kerby Moors M.D.   On: 08/30/2020 09:55   Result Date: 08/30/2020 CLINICAL DATA:  Shortness of breath.  Evaluate for infiltrates. EXAM: PORTABLE CHEST 1 VIEW COMPARISON:  12/04/2015. FINDINGS: Normal heart size and mediastinal contours. Diminished lung volumes with marked asymmetric elevation of the right hemidiaphragm. Lucency along the undersurface of both right and left hemidiaphragm noted. No pleural effusion or edema identified. No airspace densities. IMPRESSION: 1. No focal pulmonary opacities. 2. Marked asymmetric elevation of the right hemidiaphragm. Lucency along the undersurface of both right and left hemidiaphragm. Cannot rule out pneumoperitoneum. In a patient presenting with progressive abdominal pain and  distension advise further investigation with CT of the abdomen and pelvis. Electronically Signed: By: Kerby Moors M.D. On: 08/30/2020 09:49        Scheduled Meds: . pantoprazole (PROTONIX) IV  40 mg Intravenous QHS   Continuous Infusions: . sodium chloride 125 mL/hr at 08/30/20 2319  . diltiazem (CARDIZEM) infusion 10 mg/hr (08/31/20 0729)  . piperacillin-tazobactam (ZOSYN)  IV 3.375 g (08/31/20 0127)  . potassium chloride       LOS: 1 day    Time spent: 33 mins     Wyvonnia Dusky, MD [facility] Pager [phone]  If 7PM-7AM, please contact night-coverage 08/31/2020, 7:45 AM

## 2020-08-31 NOTE — Progress Notes (Signed)
*  PRELIMINARY RESULTS* Echocardiogram 2D Echocardiogram has been performed.  Henry Hill 08/31/2020, 11:42 AM

## 2020-08-31 NOTE — Progress Notes (Signed)
NP notified. Cardizem gtt increased to 10mg /hr per verbal order from NP.   08/31/20 0100  Vitals  BP 116/76  Pulse Rate (!) 105

## 2020-08-31 NOTE — Progress Notes (Signed)
Peripherally Inserted Central Catheter Placement  The IV Nurse has discussed with the patient and/or persons authorized to consent for the patient, the purpose of this procedure and the potential benefits and risks involved with this procedure.  The benefits include less needle sticks, lab draws from the catheter, and the patient may be discharged home with the catheter. Risks include, but not limited to, infection, bleeding, blood clot (thrombus formation), and puncture of an artery; nerve damage and irregular heartbeat and possibility to perform a PICC exchange if needed/ordered by physician.  Alternatives to this procedure were also discussed.  Bard Power PICC patient education guide, fact sheet on infection prevention and patient information card has been provided to patient /or left at bedside.    PICC Placement Documentation  PICC Double Lumen 08/31/20 PICC Right Brachial 39 cm 0 cm (Active)  Indication for Insertion or Continuance of Line Vasoactive infusions 08/31/20 1800  Exposed Catheter (cm) 0 cm 08/31/20 1800  Site Assessment Clean;Dry;Intact 08/31/20 1800  Lumen #1 Status Flushed;Saline locked;Blood return noted 08/31/20 1800  Lumen #2 Status Flushed;Saline locked;Blood return noted 08/31/20 1800  Dressing Type Transparent;Securing device 08/31/20 1800  Dressing Status Clean;Dry;Intact 08/31/20 1800  Antimicrobial disc in place? Yes 08/31/20 1800  Safety Lock Not Applicable 94/70/96 2836  Line Care Connections checked and tightened 08/31/20 1800  Dressing Intervention New dressing 08/31/20 1800  Dressing Change Due 09/07/20 08/31/20 1800       Henry Hill 08/31/2020, 6:45 PM

## 2020-08-31 NOTE — Progress Notes (Signed)
30mg  Cardizem IV push ordered and administered. NP notified of the VS below for post IV push. Given verbal orders to maintain pt on 5mg /hr cardizem gtt at this time. Will notify NP if HR increases above 100bpm.  08/30/20 2300  Vitals  BP 106/68  MAP (mmHg) 81  BP Location Right Arm  BP Method Automatic  Patient Position (if appropriate) Lying  Pulse Rate 81  Pulse Rate Source Monitor  Resp 20  Level of Consciousness  Level of Consciousness Alert  MEWS COLOR  MEWS Score Color Green  MEWS Score  MEWS Temp 0  MEWS Systolic 0  MEWS Pulse 0  MEWS RR 0  MEWS LOC 0  MEWS Score 0

## 2020-08-31 NOTE — Progress Notes (Signed)
08/31/2020  Subjective: Patient is 1 Day Post-Op status post open right colectomy yesterday for cecal volvulus.  Intra-abdominally, the patient cecum was significantly distended but did not show ischemia or necrosis.  There was no stool spillage and were able to perform a side-to-side ileocolonic anastomosis without complications.  He overall had about 3.5 L of enteric contents milked or drained through the NG tube.  After surgery had been completed and prior to extubation, the patient started going into SVT which on EKG eventually showed atrial fibrillation with RVR.  He was given IV diltiazem x2 which did help temporarily.  We consulted the hospitalist team to help [place] with this management.  He was transferred to progressive unit for a diltiazem drip.  Over the course of the night he was started 5 mg/kg for the diltiazem which had to be titrated upwards now is back down to 5.  Today, the patient reports that he has abdominal soreness around the incision and the NG tube is a little bit uncomfortable but otherwise feels better than yesterday.  His rate this morning is controlled with a rate of around 80 but still in atrial fibrillation.  His NG tube is working well with low output and he is making very good urine.  His creatinine this morning has normalized compared to preop his lactic acidosis is also resolved.  His white blood cell count today is also improved to 7.6.  However he continues to be hyponatremic with a sodium of 129, chloride 92, and also hypokalemic with a potassium of 3.4.  Vital signs: Temp:  [97.1 F (36.2 C)-98.4 F (36.9 C)] 98.2 F (36.8 C) (04/24 0725) Pulse Rate:  [54-115] 94 (04/24 1101) Resp:  [16-38] 20 (04/24 1101) BP: (98-142)/(63-92) 108/64 (04/24 1101) SpO2:  [90 %-100 %] 96 % (04/24 1101)   Intake/Output: 04/23 0701 - 04/24 0700 In: 2900 [I.V.:2300; IV Piggyback:600] Out: 3640 [Urine:600; Drains:10; Blood:30]    Physical Exam: Constitutional: No acute  distress Cardiac: Irregular rhythm of atrial fibrillation but rate controlled Pulm: No respiratory distress Abdomen: Soft, nondistended, appropriately tender to palpation.  Midline incision is clean, dry, intact with staples and dressing in place.  Right lower quadrant Blake drain with serosanguineous fluid.  NG tube in place to suction with gastric contents.  Labs:  Recent Labs    08/30/20 0907 08/31/20 0135  WBC 11.8* 7.6  HGB 16.1 12.4*  HCT 43.8 33.8*  PLT 326 206   Recent Labs    08/30/20 2110 08/31/20 0135  NA 127* 129*  K 3.4* 3.4*  CL 88* 92*  CO2 27 27  GLUCOSE 127* 127*  BUN 52* 39*  CREATININE 1.15 0.93  CALCIUM 7.8* 7.6*   Recent Labs    08/30/20 0907  LABPROT 12.3  INR 0.9    Imaging: [place] EKG SITE RITE  Result Date: 08/31/2020 If Site Rite image not attached, placement could not be confirmed due to current cardiac rhythm.   Assessment/Plan: This is a 60 y.o. male s/p open right colectomy for cecal volvulus with ileocolonic anastomosis.  - Patient had a successful surgery and tolerated the procedure well but went into atrial fibrillation postoperatively.  Currently he is rate controlled with the use of diltiazem drip.  Greatly appreciate our hospitalist colleagues for their assistance in the management of this patient.  He is scheduled to have an echocardiogram this morning to evaluate his heart as well. - Continue with IV fluid hydration given his hyponatremia and recent AKI on admission.  We  will also replete his potassium today with IV potassium.  We will repeat labs in the morning. - The patient reported preop that he had not had anything to eat since Tuesday 4/19.  Will place order today for PICC line and to start TPN for nutrition purposes. - Given that his creatinine has normalized and he is making very good urine, will DC his Foley catheter today. - Continue NG tube to suction until return of bowel function. - We will start DVT prophylaxis today.   Continue IV Protonix for GI prophylaxis   Melvyn Neth, [facility]

## 2020-09-01 ENCOUNTER — Encounter: Payer: Self-pay | Admitting: Surgery

## 2020-09-01 LAB — CBC
HCT: 31.7 % — ABNORMAL LOW (ref 39.0–52.0)
Hemoglobin: 10.8 g/dL — ABNORMAL LOW (ref 13.0–17.0)
MCH: 33.8 pg (ref 26.0–34.0)
MCHC: 34.1 g/dL (ref 30.0–36.0)
MCV: 99.1 fL (ref 80.0–100.0)
Platelets: 234 10*3/uL (ref 150–400)
RBC: 3.2 MIL/uL — ABNORMAL LOW (ref 4.22–5.81)
RDW: 13.1 % (ref 11.5–15.5)
WBC: 9 10*3/uL (ref 4.0–10.5)
nRBC: 0 % (ref 0.0–0.2)

## 2020-09-01 LAB — COMPREHENSIVE METABOLIC PANEL
ALT: 28 U/L (ref 0–44)
AST: 19 U/L (ref 15–41)
Albumin: 2.7 g/dL — ABNORMAL LOW (ref 3.5–5.0)
Alkaline Phosphatase: 27 U/L — ABNORMAL LOW (ref 38–126)
Anion gap: 6 (ref 5–15)
BUN: 19 mg/dL (ref 6–20)
CO2: 25 mmol/L (ref 22–32)
Calcium: 7.6 mg/dL — ABNORMAL LOW (ref 8.9–10.3)
Chloride: 101 mmol/L (ref 98–111)
Creatinine, Ser: 0.6 mg/dL — ABNORMAL LOW (ref 0.61–1.24)
GFR, Estimated: >60 mL/min (ref >60)
Glucose, Bld: 109 mg/dL — ABNORMAL HIGH (ref 70–99)
Potassium: 3.4 mmol/L — ABNORMAL LOW (ref 3.5–5.1)
Sodium: 132 mmol/L — ABNORMAL LOW (ref 135–145)
Total Bilirubin: 0.8 mg/dL (ref 0.3–1.2)
Total Protein: 5.5 g/dL — ABNORMAL LOW (ref 6.5–8.1)

## 2020-09-01 LAB — GLUCOSE, CAPILLARY
Glucose-Capillary: 135 mg/dL — ABNORMAL HIGH (ref 70–99)
Glucose-Capillary: 135 mg/dL — ABNORMAL HIGH (ref 70–99)
Glucose-Capillary: 122 mg/dL — ABNORMAL HIGH (ref 70–99)
Glucose-Capillary: 145 mg/dL — ABNORMAL HIGH (ref 70–99)
Glucose-Capillary: 133 mg/dL — ABNORMAL HIGH (ref 70–99)

## 2020-09-01 LAB — ECHOCARDIOGRAM COMPLETE
AR max vel: 3.09 cm2
AV Peak grad: 7.4 mmHg
Ao pk vel: 1.36 m/s
Area-P 1/2: 2.87 cm2
Height: 73 in
S' Lateral: 3.19 cm
Weight: 3168 oz

## 2020-09-01 LAB — DIFFERENTIAL
Abs Immature Granulocytes: 0.04 10*3/uL (ref 0.00–0.07)
Basophils Absolute: 0 10*3/uL (ref 0.0–0.1)
Basophils Relative: 0 %
Eosinophils Absolute: 0.3 10*3/uL (ref 0.0–0.5)
Eosinophils Relative: 3 %
Immature Granulocytes: 0 %
Lymphocytes Relative: 16 %
Lymphs Abs: 1.5 10*3/uL (ref 0.7–4.0)
Monocytes Absolute: 1.2 10*3/uL — ABNORMAL HIGH (ref 0.1–1.0)
Monocytes Relative: 13 %
Neutro Abs: 5.9 10*3/uL (ref 1.7–7.7)
Neutrophils Relative %: 68 %
Smear Review: NORMAL

## 2020-09-01 LAB — PHOSPHORUS: Phosphorus: 1.4 mg/dL — ABNORMAL LOW (ref 2.5–4.6)

## 2020-09-01 LAB — TRIGLYCERIDES: Triglycerides: 48 mg/dL (ref <150)

## 2020-09-01 LAB — PREALBUMIN: Prealbumin: 6.9 mg/dL — ABNORMAL LOW (ref 18–38)

## 2020-09-01 LAB — MAGNESIUM: Magnesium: 2.3 mg/dL (ref 1.7–2.4)

## 2020-09-01 MED ORDER — POTASSIUM CHLORIDE 10 MEQ/100ML IV SOLN
10.0000 meq | INTRAVENOUS | Status: AC
  Administered 2020-09-01 (×3): 10 meq via INTRAVENOUS
  Filled 2020-09-01 (×3): qty 100

## 2020-09-01 MED ORDER — POTASSIUM PHOSPHATES 15 MMOLE/5ML IV SOLN
20.0000 mmol | Freq: Once | INTRAVENOUS | Status: AC
  Administered 2020-09-01: 20 mmol via INTRAVENOUS
  Filled 2020-09-01: qty 6.67

## 2020-09-01 MED ORDER — TRAVASOL 10 % IV SOLN
INTRAVENOUS | Status: AC
  Filled 2020-09-01: qty 806.4

## 2020-09-01 MED ORDER — CALCIUM GLUCONATE-NACL 2-0.675 GM/100ML-% IV SOLN
2.0000 g | Freq: Once | INTRAVENOUS | Status: AC
  Administered 2020-09-01: 2000 mg via INTRAVENOUS
  Filled 2020-09-01: qty 100

## 2020-09-01 NOTE — Progress Notes (Signed)
PHARMACY - TOTAL PARENTERAL NUTRITION CONSULT NOTE   Indication: Prolonged ileus  Patient Measurements: Height: 6\' 1"  (185.4 cm) Weight: 88.7 kg (195 lb 8.8 oz) IBW/kg (Calculated) : 79.9 TPN AdjBW (KG): 89.8 Body mass index is 25.8 kg/m.  Assessment:  60 y.o. male presenting with a 5 day history of nausea, vomiting and 3 day history of worsening abdominal pain.  He has been unable to keep solid po intake, but was able to drink some fluids.  However, given the worsening pain, he presented today to the ER for evaluation.  In the ER, he has remained tachycardic, with abdominal distention. Pharmacy has been consulted for TPN management.   Glucose / Insulin: +SSI (1u/24h) 24h BG 109-135 (a1c 5.4%) Electrolytes:  Na 129>132 (improving), K 3.4>3.4, Cl 92>101 (improving);  Phos: 1.4 Corrected calcium 8.2, stable all other WNL Renal: Scr 0.93>0.6 Hepatic: AST 54>15, ALT 55>28 Intake / Output; MIVF: D5NS @75ml /hr Net I/O = (-) 1092mL GI Imaging: 4/23 CT abdomen: high grade bowel obstruction. 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  4/23 Xray abdomen: bowel obstruction GI Surgeries / Procedures:  4/23 exploratory laparotomy with right coloctomy   Central access: Placement on 4/24  TPN start date: 4/24>>  Nutritional Goals (per [facility] TPN Handbook): kCal: 2700 kcal/day, Protein: 110 g/day, Fluid: 2.7 L/day Goal TPN rate is 110 mL/hr (provides 110 g of protein and 2723 kcals per day)  Current Nutrition:  NPO  Plan:   Increase TPN rate to 80 mL/hr at 1800  Electrolytes in TPN: Na 48mEq/L, K 92mEq/L, Ca 19mEq/L, Mg 90mEq/L, and Phos 34mmol/L. Cl:Ac 1:1  K 3.4 - unchanged; Replete with IV KCl 76mEq x3 (and ~61meq from IV KPhos)  Phos: 1.4; replete with 9mmol IV KPhos (provides 78meq K+)  Ca: 7.6 (8.2 corrected) replete with 2g IV CaGluc x1  MIVF reduced to D5NS @75  ml/hr  Add standard MVI and trace elements to TPN  Continue sensitive q4h SSI and adjust as needed   Monitor TPN  labs on Mon/Thurs    Lorna Dibble, PharmD 09/01/2020 7:37 AM

## 2020-09-01 NOTE — Progress Notes (Signed)
PROGRESS NOTE    Henry Hill  QPY:[id number] DOB: 1960-12-30 DOA: 08/30/2020 PCP: Margo Common, PA-C   Assessment & Plan:   Active Problems:   Essential (primary) hypertension   Cecal volvulus (HCC)   Atrial fibrillation with RVR (HCC)   AKI (acute kidney injury) (HCC)   A. fib: w/ RVR. New onset. Continue on IV diltiazem drip until no longer NPO. CHA2DS2VASc Score of 1 so will hold off on anticoagulation. Continue on tele.   Elevated troponins: likely secondary to demand ischemia   HTN: continue on IV diltiazem drip while the pt is NPO   Cecal volvulus: s/p laparotomy with right colectomy. Management per primary surgical team   AKI: resolved   Alcohol abuse: drinks 4-5 beers daily.  Last drink about 3 days ago with no signs of withdrawal but may need CIWA protocol  Hyponatremia: trending up today. Continue on IVFs   Hypokalemia: KCl repleated. Will continue to monitor   Hypophosphatemia: potassium phosphate ordered. Will continue to monitor   DVT prophylaxis: lovenox  Code Status: full  Family Communication: discussed pt's care w/ pt's family at bedside and answered their questions  Disposition Plan: unclear,As per primary surg team   Level of care: Progressive Cardiac   Status is: Inpatient  Remains inpatient appropriate because:Ongoing diagnostic testing needed not appropriate for outpatient work up, Unsafe d/c plan, IV treatments appropriate due to intensity of illness or inability to take PO and Inpatient level of care appropriate due to severity of illness   Dispo: The patient is from: Home              Anticipated d/c is to: as per primary surgery team               Patient currently is not medically stable to d/c.   Difficult to place patient Yes    Consultants:   hospitalist    Procedures:    Antimicrobials: zosyn   Subjective: Pt c/o fatigue.   Objective: Vitals:   08/31/20 1944 09/01/20 0334 09/01/20 0749 09/01/20 1202  BP:  123/75 130/74 132/88 134/78  Pulse: 87 93 (!) 103 97  Resp: 20 20 14  (!) 22  Temp: 97.6 F (36.4 C) 97.6 F (36.4 C) 97.8 F (36.6 C) (!) 97.5 F (36.4 C)  TempSrc: Oral Oral Oral Oral  SpO2: 96% 98% 98% 100%  Weight:      Height:        Intake/Output Summary (Last 24 hours) at 09/01/2020 1337 Last data filed at 09/01/2020 1200 Gross per 24 hour  Intake 1874.94 ml  Output 1610 ml  Net 264.94 ml   Filed Weights   08/30/20 0857 08/31/20 1000  Weight: 89.8 kg 88.7 kg    Examination:  General exam: Appears comfortable. NG tube in place  Respiratory system: clear breath sounds b/l  Cardiovascular system: S1/S2+. No rubs or clicks   Gastrointestinal system: Abd is soft, tender to palpation & hypoactive bowel sounds  Central nervous system: Alert and oriented. Moves all extremities   Psychiatry: judgement and insight appear normal. Flat mood and affect     Data Reviewed: I have personally reviewed following labs and imaging studies  CBC: Recent Labs  Lab 08/30/20 0907 08/31/20 0135 09/01/20 0618  WBC 11.8* 7.6 9.0  NEUTROABS 9.2*  --  5.9  HGB 16.1 12.4* 10.8*  HCT 43.8 33.8* 31.7*  MCV 91.8 91.8 99.1  PLT 326 206 124   Basic Metabolic Panel: Recent Labs  Lab 08/30/20  0347 08/30/20 1014 08/30/20 2110 08/31/20 0135 09/01/20 0618  NA 125*  --  127* 129* 132*  K 3.3*  --  3.4* 3.4* 3.4*  CL 75*  --  88* 92* 101  CO2 28  --  27 27 25   GLUCOSE 177*  --  127* 127* 109*  BUN 72*  --  52* 39* 19  CREATININE 1.62* 1.80* 1.15 0.93 0.60*  CALCIUM 9.9  --  7.8* 7.6* 7.6*  MG 2.2  --  1.8 1.9 2.3  PHOS  --   --   --   --  1.4*   GFR: Estimated Creatinine Clearance: 111 mL/min (A) (by C-G formula based on SCr of 0.6 mg/dL (L)). Liver Function Tests: Recent Labs  Lab 08/30/20 0907 09/01/20 0618  AST 54* 19  ALT 55* 28  ALKPHOS 54 27*  BILITOT 1.5* 0.8  PROT 8.0 5.5*  ALBUMIN 4.6 2.7*   No results for input(s): LIPASE, AMYLASE in the last 168 hours. No  results for input(s): AMMONIA in the last 168 hours. Coagulation Profile: Recent Labs  Lab 08/30/20 0907  INR 0.9   Cardiac Enzymes: No results for input(s): CKTOTAL, CKMB, CKMBINDEX, TROPONINI in the last 168 hours. BNP (last 3 results) No results for input(s): PROBNP in the last 8760 hours. HbA1C: Recent Labs    08/31/20 1138  HGBA1C 5.4   CBG: Recent Labs  Lab 08/31/20 2022 08/31/20 2317 09/01/20 0333 09/01/20 0750 09/01/20 1207  GLUCAP 114* 120* 135* 135* 122*   Lipid Profile: Recent Labs    09/01/20 0619  TRIG 48   Thyroid Function Tests: Recent Labs    08/30/20 2110  TSH 0.864   Anemia Panel: No results for input(s): VITAMINB12, FOLATE, FERRITIN, TIBC, IRON, RETICCTPCT in the last 72 hours. Sepsis Labs: Recent Labs  Lab 08/30/20 0909 08/30/20 1206 08/30/20 2251 08/31/20 0135  LATICACIDVEN 4.3* 2.2* 1.4 1.2    Recent Results (from the past 240 hour(s))  Resp Panel by RT-PCR (Flu A&B, Covid) Nasopharyngeal Swab     Status: None   Collection Time: 08/30/20  9:08 AM   Specimen: Nasopharyngeal Swab; Nasopharyngeal(NP) swabs in vial transport medium  Result Value Ref Range Status   SARS Coronavirus 2 by RT PCR NEGATIVE NEGATIVE Final    Comment: (NOTE) SARS-CoV-2 target nucleic acids are NOT DETECTED.  The SARS-CoV-2 RNA is generally detectable in upper respiratory specimens during the acute phase of infection. The lowest concentration of SARS-CoV-2 viral copies this assay can detect is 138 copies/mL. A negative result does not preclude SARS-Cov-2 infection and should not be used as the sole basis for treatment or other patient management decisions. A negative result may occur with  improper specimen collection/handling, submission of specimen other than nasopharyngeal swab, presence of viral mutation(s) within the areas targeted by this assay, and inadequate number of viral copies(<138 copies/mL). A negative result must be combined with clinical  observations, patient history, and epidemiological information. The expected result is Negative.  Fact Sheet for Patients:  EntrepreneurPulse.com.au  Fact Sheet for Healthcare Providers:  IncredibleEmployment.be  This test is no t yet approved or cleared by the [place] FDA and  has been authorized for detection and/or diagnosis of SARS-CoV-2 by FDA under an Emergency Use Authorization (EUA). This EUA will remain  in effect (meaning this test can be used) for the duration of the COVID-19 declaration under Section 564(b)(1) of the Act, 21 U.S.C.section 360bbb-3(b)(1), unless the authorization is terminated  or revoked sooner.  Influenza A by PCR NEGATIVE NEGATIVE Final   Influenza B by PCR NEGATIVE NEGATIVE Final    Comment: (NOTE) The Xpert Xpress SARS-CoV-2/FLU/RSV plus assay is intended as an aid in the diagnosis of influenza from Nasopharyngeal swab specimens and should not be used as a sole basis for treatment. Nasal washings and aspirates are unacceptable for Xpert Xpress SARS-CoV-2/FLU/RSV testing.  Fact Sheet for Patients: EntrepreneurPulse.com.au  Fact Sheet for Healthcare Providers: IncredibleEmployment.be  This test is not yet approved or cleared by the [place] FDA and has been authorized for detection and/or diagnosis of SARS-CoV-2 by FDA under an Emergency Use Authorization (EUA). This EUA will remain in effect (meaning this test can be used) for the duration of the COVID-19 declaration under Section 564(b)(1) of the Act, 21 U.S.C. section 360bbb-3(b)(1), unless the authorization is terminated or revoked.  Performed at [facility], [address] [place], [place] [address]   Blood culture (single)     Status: None (Preliminary result)   Collection Time: 08/30/20  9:09 AM   Specimen: BLOOD  Result Value Ref Range Status   Specimen Description BLOOD LEFT  ANTECUBITAL  Final   Special Requests   Final    BOTTLES DRAWN AEROBIC AND ANAEROBIC Blood Culture results may not be optimal due to an excessive volume of blood received in culture bottles   Culture   Final    NO GROWTH 2 DAYS Performed at [facility], [address], [place], [place] [address]    Report Status PENDING  Incomplete         Radiology Studies: DG Chest Port 1 View  Result Date: 08/31/2020 CLINICAL DATA:  Peripherally inserted central catheter EXAM: PORTABLE CHEST 1 VIEW COMPARISON:  08/30/2020 FINDINGS: Right-sided PICC line tip is in the mid SVC. Cardiomediastinal contours are normal. Esophageal catheter side port projects within the stomach. Normal pleural spaces. IMPRESSION: PICC line tip in the mid SVC. Electronically Signed   By: Ulyses Jarred M.D.   On: 08/31/2020 19:18   ECHOCARDIOGRAM COMPLETE  Result Date: 09/01/2020    ECHOCARDIOGRAM REPORT   Patient Name:   Henry Hill Date of Exam: 08/31/2020 Medical Rec #:  [id number]     Height:       73.0 in Accession #:    [id number]    Weight:       198.0 lb Date of Birth:  1960-06-07     BSA:          2.142 m Patient Age:    60 years      BP:           118/86 mmHg Patient Gender: M             HR:           76 bpm. Exam Location:  [facility] Procedure: 2D Echo, Cardiac Doppler and Color Doppler Indications:     Atrial Fibrillation I48.91  History:         Patient has no prior history of Echocardiogram examinations.                  Risk Factors:Hypertension.  Sonographer:     Alyse Low Roar Referring Phys:  [id number] [place] T TU Diagnosing Phys: Neoma Laming MD IMPRESSIONS  1. Left ventricular ejection fraction, by estimation, is 50 to 55%. The left ventricle has low normal function. The left ventricle has no regional wall motion abnormalities. There is mild concentric left ventricular hypertrophy. Left ventricular diastolic parameters are consistent  with Grade I diastolic dysfunction (impaired relaxation).  2. Right ventricular  systolic function is normal. The right ventricular size is normal.  3. The mitral valve is normal in structure. Trivial mitral valve regurgitation. No evidence of mitral stenosis.  4. The aortic valve is normal in structure. Aortic valve regurgitation is not visualized. No aortic stenosis is present.  5. The inferior vena cava is normal in size with greater than 50% respiratory variability, suggesting right atrial pressure of 3 mmHg. FINDINGS  Left Ventricle: Left ventricular ejection fraction, by estimation, is 50 to 55%. The left ventricle has low normal function. The left ventricle has no regional wall motion abnormalities. The left ventricular internal cavity size was normal in size. There is mild concentric left ventricular hypertrophy. Left ventricular diastolic parameters are consistent with Grade I diastolic dysfunction (impaired relaxation). Right Ventricle: The right ventricular size is normal. No increase in right ventricular wall thickness. Right ventricular systolic function is normal. Left Atrium: Left atrial size was normal in size. Right Atrium: Right atrial size was normal in size. Pericardium: There is no evidence of pericardial effusion. Mitral Valve: The mitral valve is normal in structure. Trivial mitral valve regurgitation. No evidence of mitral valve stenosis. Tricuspid Valve: The tricuspid valve is normal in structure. Tricuspid valve regurgitation is trivial. No evidence of tricuspid stenosis. Aortic Valve: The aortic valve is normal in structure. Aortic valve regurgitation is not visualized. No aortic stenosis is present. Aortic valve peak gradient measures 7.4 mmHg. Pulmonic Valve: The pulmonic valve was normal in structure. Pulmonic valve regurgitation is not visualized. No evidence of pulmonic stenosis. Aorta: The aortic root is normal in size and structure. Venous: The inferior vena cava is normal in size with greater than 50% respiratory variability, suggesting right atrial pressure of  3 mmHg. IAS/Shunts: No atrial level shunt detected by color flow Doppler.  LEFT VENTRICLE PLAX 2D LVIDd:         4.37 cm  Diastology LVIDs:         3.19 cm  LV e' medial:    8.92 cm/s LV PW:         1.13 cm  LV E/e' medial:  10.6 LV IVS:        1.46 cm  LV e' lateral:   14.40 cm/s LVOT diam:     2.30 cm  LV E/e' lateral: 6.6 LVOT Area:     4.15 cm  RIGHT VENTRICLE RV Mid diam:    2.94 cm RV S prime:     11.40 cm/s TAPSE (M-mode): 1.6 cm LEFT ATRIUM           Index       RIGHT ATRIUM           Index LA diam:      4.00 cm 1.87 cm/m  RA Area:     18.00 cm LA Vol (A4C): 69.4 ml 32.39 ml/m RA Volume:   42.60 ml  19.88 ml/m  AORTIC VALVE                PULMONIC VALVE AV Area (Vmax): 3.09 cm    PV Vmax:        0.98 m/s AV Vmax:        136.00 cm/s PV Peak grad:   3.8 mmHg AV Peak Grad:   7.4 mmHg    RVOT Peak grad: 2 mmHg LVOT Vmax:      101.00 cm/s  AORTA Ao Root diam: 3.10 cm MITRAL VALVE  TRICUSPID VALVE MV Area (PHT): 2.87 cm    TR Peak grad:   23.4 mmHg MV Decel Time: 264 msec    TR Vmax:        242.00 cm/s MV E velocity: 94.70 cm/s                            SHUNTS                            Systemic Diam: 2.30 cm Neoma Laming MD Electronically signed by Neoma Laming MD Signature Date/Time: 09/01/2020/11:04:23 AM    Final    [place] EKG SITE RITE  Result Date: 08/31/2020 If Site Rite image not attached, placement could not be confirmed due to current cardiac rhythm.       Scheduled Meds: . Chlorhexidine Gluconate Cloth  6 each Topical Daily  . enoxaparin (LOVENOX) injection  40 mg Subcutaneous Q24H  . insulin aspart  0-9 Units Subcutaneous Q4H  . pantoprazole (PROTONIX) IV  40 mg Intravenous QHS  . sodium chloride flush  10-40 mL Intracatheter Q12H   Continuous Infusions: . calcium gluconate 2,000 mg (09/01/20 1238)  . dextrose 5 % and 0.9% NaCl 50 mL/hr at 09/01/20 1114  . diltiazem (CARDIZEM) infusion 5 mg/hr (09/01/20 0745)  . piperacillin-tazobactam (ZOSYN)  IV 3.375 g (09/01/20  0910)  . potassium PHOSPHATE IVPB (in mmol) 20 mmol (09/01/20 1329)  . TPN ADULT (ION) 50 mL/hr at 08/31/20 2057  . TPN ADULT (ION)       LOS: 2 days    Time spent: 30 mins     Wyvonnia Dusky, MD [facility] Pager [phone]  If 7PM-7AM, please contact night-coverage 09/01/2020, 1:37 PM

## 2020-09-01 NOTE — Plan of Care (Signed)
  Problem: Education: Goal: Knowledge of General Education information will improve Description: Including pain rating scale, medication(s)/side effects and non-pharmacologic comfort measures Outcome: Progressing   Problem: Clinical Measurements: Goal: Diagnostic test results will improve Outcome: Progressing Goal: Cardiovascular complication will be avoided Outcome: Progressing   Problem: Coping: Goal: Level of anxiety will decrease Outcome: Progressing

## 2020-09-01 NOTE — Progress Notes (Signed)
[place] Hospital Day(s): 2.   Post op day(s): 2 Days Post-Op.   Interval History:  Patient seen and examined No acute events or new complaints overnight.  Patient reports he has abdominal soreness expected No fever, chills, nausea, emesis Remains in rate-controlled atrial fibrillation this morning; on diltiazem drip He continues to remain without leukocytosis; WBC 9.0K He is maintaining normal renal function; sCr - 0.60; UO - 2.7L He continues to have hypokalemia to 3.4 and hypophosphatemia to 1.4. Hyponatremia and hypochloremia improved No new imaging  NGT output in the last 24 hours recorded at 110 ccs Surgical drain with 75 ccs out; serous  He continues on TPN Awaiting return of bowel function; no flatus reported  Vital signs in last 24 hours: [min-max] current  Temp:  [97.6 F (36.4 C)-98.4 F (36.9 C)] 97.8 F (36.6 C) (04/25 0749) Pulse Rate:  [85-130] 103 (04/25 0749) Resp:  [14-20] 14 (04/25 0749) BP: (102-132)/(59-88) 132/88 (04/25 0749) SpO2:  [96 %-98 %] 98 % (04/25 0749) Weight:  [88.7 kg] 88.7 kg (04/24 1000)     Height: 6\' 1"  (185.4 cm) Weight: 88.7 kg BMI (Calculated): 25.81   Intake/Output last 2 shifts:  04/24 0701 - 04/25 0700 In: 2474.9 [I.V.:2074.9; IV Piggyback:400] Out: 2895 [Urine:2710; Emesis/NG output:110; Drains:75]   Physical Exam:  Constitutional: alert, cooperative and no distress  HEENT: NGT in palce Respiratory: breathing non-labored at rest, on [place] Cardiovascular: regular rate, irregularly irregular  Gastrointestinal: Soft, incisional soreness, non-distended, no rebound/guarding, surgical drain in place with serous output Integumentary: Laparotomy incision is CDI with staples and honeycomb, no erythema, no drainage   Labs:  CBC Latest Ref Rng & Units 09/01/2020 08/31/2020 08/30/2020  WBC 4.0 - 10.5 K/uL 9.0 7.6 11.8(H)  Hemoglobin 13.0 - 17.0 g/dL 10.8(L) 12.4(L) 16.1  Hematocrit 39.0 - 52.0 %  31.7(L) 33.8(L) 43.8  Platelets 150 - 400 K/uL 234 206 326   CMP Latest Ref Rng & Units 09/01/2020 08/31/2020 08/30/2020  Glucose 70 - 99 mg/dL 109(H) 127(H) 127(H)  BUN 6 - 20 mg/dL 19 39(H) 52(H)  Creatinine 0.61 - 1.24 mg/dL 0.60(L) 0.93 1.15  Sodium 135 - 145 mmol/L 132(L) 129(L) 127(L)  Potassium 3.5 - 5.1 mmol/L 3.4(L) 3.4(L) 3.4(L)  Chloride 98 - 111 mmol/L 101 92(L) 88(L)  CO2 22 - 32 mmol/L 25 27 27   Calcium 8.9 - 10.3 mg/dL 7.6(L) 7.6(L) 7.8(L)  Total Protein 6.5 - 8.1 g/dL 5.5(L) - -  Total Bilirubin 0.3 - 1.2 mg/dL 0.8 - -  Alkaline Phos 38 - 126 U/L 27(L) - -  AST 15 - 41 U/L 19 - -  ALT 0 - 44 U/L 28 - -    Imaging studies: No new pertinent imaging studies   Assessment/Plan:  60 y.o. male awaiting return of bowel function with likely post-surgical ileus 2 Days Post-Op s/p exploratory laparotomy and right colectomy for cecal volvulus, complicated by pertinent comorbidities including new onset atrial fibrillation currently rate controlled.   - Continue NPO + TPN this morning; decreased IVF rate to 50 ml/hr   - Continue NGT decompression; LIS; monitor and record output   - Monitor abdominal examination; on-going bowel function  - Pain control prn; antiemetics prn   - Mobilization as tolerated  - Replete electrolytes; monitor; improving   - continue CCB for atrial fibrillation  - Appreciate medicine assistance with comorbid condtions  All of the above findings and recommendations were discussed with the patient, patient's family (wife at bedside),  and the medical team, and all of patient's and family's questions were answered to their expressed satisfaction.  -- Edison Simon, PA-C [facility] 09/01/2020, 9:47 AM [phone] M-F: 7am - 4pm

## 2020-09-02 LAB — CBC
HCT: 34.5 % — ABNORMAL LOW (ref 39.0–52.0)
Hemoglobin: 11.3 g/dL — ABNORMAL LOW (ref 13.0–17.0)
MCH: 36.1 pg — ABNORMAL HIGH (ref 26.0–34.0)
MCHC: 32.8 g/dL (ref 30.0–36.0)
MCV: 110.2 fL — ABNORMAL HIGH (ref 80.0–100.0)
Platelets: 260 10*3/uL (ref 150–400)
RBC: 3.13 MIL/uL — ABNORMAL LOW (ref 4.22–5.81)
RDW: 13.7 % (ref 11.5–15.5)
WBC: 9.1 10*3/uL (ref 4.0–10.5)
nRBC: 0.4 % — ABNORMAL HIGH (ref 0.0–0.2)

## 2020-09-02 LAB — MAGNESIUM: Magnesium: 2 mg/dL (ref 1.7–2.4)

## 2020-09-02 LAB — GLUCOSE, CAPILLARY
Glucose-Capillary: 122 mg/dL — ABNORMAL HIGH (ref 70–99)
Glucose-Capillary: 127 mg/dL — ABNORMAL HIGH (ref 70–99)
Glucose-Capillary: 129 mg/dL — ABNORMAL HIGH (ref 70–99)
Glucose-Capillary: 131 mg/dL — ABNORMAL HIGH (ref 70–99)
Glucose-Capillary: 135 mg/dL — ABNORMAL HIGH (ref 70–99)
Glucose-Capillary: 142 mg/dL — ABNORMAL HIGH (ref 70–99)
Glucose-Capillary: 151 mg/dL — ABNORMAL HIGH (ref 70–99)

## 2020-09-02 LAB — BASIC METABOLIC PANEL
Anion gap: 7 (ref 5–15)
BUN: 10 mg/dL (ref 6–20)
CO2: 24 mmol/L (ref 22–32)
Calcium: 8.2 mg/dL — ABNORMAL LOW (ref 8.9–10.3)
Chloride: 100 mmol/L (ref 98–111)
Creatinine, Ser: 0.55 mg/dL — ABNORMAL LOW (ref 0.61–1.24)
GFR, Estimated: >60 mL/min (ref >60)
Glucose, Bld: 112 mg/dL — ABNORMAL HIGH (ref 70–99)
Potassium: 3.7 mmol/L (ref 3.5–5.1)
Sodium: 131 mmol/L — ABNORMAL LOW (ref 135–145)

## 2020-09-02 LAB — PHOSPHORUS: Phosphorus: 2.6 mg/dL (ref 2.5–4.6)

## 2020-09-02 LAB — SURGICAL PATHOLOGY

## 2020-09-02 MED ORDER — TRACE MINERALS CU-MN-SE-ZN 300-55-60-3000 MCG/ML IV SOLN
INTRAVENOUS | Status: AC
  Filled 2020-09-02: qty 1108.8

## 2020-09-02 MED ORDER — MENTHOL 3 MG MT LOZG
1.0000 | LOZENGE | OROMUCOSAL | Status: DC | PRN
  Administered 2020-09-02: 3 mg via ORAL
  Filled 2020-09-02: qty 9

## 2020-09-02 MED ORDER — PHENOL 1.4 % MT LIQD
1.0000 | OROMUCOSAL | Status: DC | PRN
  Administered 2020-09-02: 1 via OROMUCOSAL
  Filled 2020-09-02 (×2): qty 177

## 2020-09-02 NOTE — Progress Notes (Signed)
Initial Nutrition Assessment  DOCUMENTATION CODES:   Not applicable  INTERVENTION:   -TPN management per pharmacy -RD will follow for diet advancement and add supplements as appropriate  NUTRITION DIAGNOSIS:   Increased nutrient needs related to post-op healing as evidenced by estimated needs.  GOAL:   Patient will meet greater than or equal to 90% of their needs  MONITOR:   Diet advancement,Labs,Weight trends,Skin,I & O's  REASON FOR ASSESSMENT:   Other (Comment) (TPN)    ASSESSMENT:   Henry Hill is a 60 y.o. male presenting with a 5 day history of nausea, vomiting and 3 day history of worsening abdominal pain.  Pt admitted with cecal volvulus causing bowel obstruction.   4/23- s/p  Procedure:  Exploratory laparotomy, Right Colectomy. 4/24- PICC placed, TPN initiated   Reviewed I/O's: 323 ml x 24 hours and -1.5 L since admission  UOP: 2.2 L x 24 hours  NGT output: 200 ml x 24 hours  Drain output: 90 ml x 24 hours  Case discussed with pharmacist, who reports pt was started on TPN on 08/31/20.  Spoke with pt and wife at bedside. Pt shares that he is feeling better, but starting to feel hunger pains and is ready to eat solid foods. He has been consuming ice chips without difficulty and tolerating clamping trials. Per pt, possible plan to remove NGT today.   Per pt, with poor oral intake for 5 days PTA due to inability to keep foods and liquids down secondary to vomiting. Pt shares he tried to keep down gatorade and water without success.   Prior to acute illness, pt and wife confirm pt has a very healthy appetite. Pt consumes "3 square meals per day" and consumes steak, biscuits, baked potato, and veggies at each meal.   Per pt, UBW is around 200#. He denies any weight loss.   Discussed rationale for TPN and NPO status, as well as possibility for diet advancement and weaning TPN.   Pt currently receiving TPN at 80 ml/hr, which provides 1981 kcals and 80 grams  protein, meeting 81% of estimated kcal needs and 67% of estimated kcal needs. Per pharmacy note, plan to increase TPN to 110 ml/hr at 1800, which provides 2724 kcals and 111 grams protein, meeting >100% of estimated kcal needs and 93% of estimated protein needs.  Medications reviewed and include cardizem and dextrose 5%-0.9% sodium chloride infusion @ 50 ml/hr.   Lab Results  Component Value Date   HGBA1C 5.4 08/31/2020   PTA DM medications are none.   Labs reviewed: Na: 131, CBGS: 127-142 (inpatient orders for glycemic control are 0-9 units insulin aspart every 4 hours).   NUTRITION - FOCUSED PHYSICAL EXAM:  Flowsheet Row Most Recent Value  Orbital Region No depletion  Upper Arm Region No depletion  Thoracic and Lumbar Region No depletion  Buccal Region No depletion  Temple Region No depletion  Clavicle Bone Region No depletion  Clavicle and Acromion Bone Region No depletion  Scapular Bone Region No depletion  Dorsal Hand Mild depletion  Patellar Region No depletion  Anterior Thigh Region No depletion  Posterior Calf Region No depletion  Edema (RD Assessment) None  Hair Reviewed  Eyes Reviewed  Mouth Reviewed  Skin Reviewed  Nails Reviewed       Diet Order:   Diet Order            Diet NPO time specified  Diet effective now  EDUCATION NEEDS:   Education needs have been addressed  Skin:  Skin Assessment: Skin Integrity Issues: Skin Integrity Issues:: Incisions Incisions: closed abdomen  Last BM:  09/02/20  Height:   Ht Readings from Last 1 Encounters:  08/30/20 6\' 1"  (1.854 m)    Weight:   Wt Readings from Last 1 Encounters:  09/02/20 81.6 kg    Ideal Body Weight:  83.6 kg  BMI:  Body mass index is 23.75 kg/m.  Estimated Nutritional Needs:   Kcal:  4034-7425  Protein:  120-150 grams  Fluid:  > 2 L    [name], RD, LDN, [place] Registered Dietitian II Certified Diabetes Care and Education Specialist Please refer to  [facility] for RD and/or RD on-call/weekend/after hours pager

## 2020-09-02 NOTE — Plan of Care (Signed)
  Problem: Health Behavior/Discharge Planning: Goal: Ability to manage health-related needs will improve Outcome: Progressing   Problem: Clinical Measurements: Goal: Ability to maintain clinical measurements within normal limits will improve Outcome: Progressing Goal: Will remain free from infection Outcome: Progressing Goal: Diagnostic test results will improve Outcome: Progressing Goal: Cardiovascular complication will be avoided Outcome: Progressing

## 2020-09-02 NOTE — Progress Notes (Signed)
PHARMACY - TOTAL PARENTERAL NUTRITION CONSULT NOTE   Indication: Prolonged ileus  Patient Measurements: Height: 6\' 1"  (185.4 cm) Weight: 81.6 kg (180 lb) IBW/kg (Calculated) : 79.9 TPN AdjBW (KG): 89.8 Body mass index is 23.75 kg/m.  Assessment:  60 y.o. male presenting with a 5 day history of nausea, vomiting and 3 day history of worsening abdominal pain.  He has been unable to keep solid po intake, but was able to drink some fluids.  However, given the worsening pain, he presented today to the ER for evaluation.  In the ER, he has remained tachycardic, with abdominal distention. Pharmacy has been consulted for TPN management.   Glucose / Insulin: +SSI (4 u/24h) 24h BG 112-142 (a1c 5.4%) Electrolytes:  Na 132>131 (stable) K 3.4>3.7 (now WNL) Phos: 1.4 >2.6 (now WNL) Corrected calcium 8.2>8.2, (stable) - all others WNL; Triglycerides also returned WNL (48mg /dL on 4/25) Renal: Scr 0.6>0.55 (stable) Hepatic: AST 54>15, ALT 55>28 (NNL both stable) Intake / Output; MIVF: D5NS @75 >28ml/hr Net I/O = (-) 1411mL GI Imaging: 4/23 CT abdomen: high grade bowel obstruction. 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  4/23 Xray abdomen: bowel obstruction GI Surgeries / Procedures:  4/23 exploratory laparotomy with right coloctomy   Central access: Placement on 4/24  TPN start date: 4/24>>  Nutritional Goals (per [facility] TPN Handbook): kCal: 2700 kcal/day, Protein: 110 g/day, Fluid: 2.7 L/day Goal TPN rate is 110 mL/hr (provides 110 g of protein and 2723 kcals per day)  Current Nutrition:  NPO  Plan:   Increase TPN rate to 110 mL/hr at 1800  Electrolytes in TPN: Na 13mEq/L, K 94mEq/L, Ca 76mEq/L, Mg 45mEq/L, and Phos 38mmol/L. Cl:Ac 1:1  Low K+ resolved; no further repletion outside of TPN today.  Low Phos resolved resolved; no further repletion outside of TPN today.  Ca: 7.6>8.2 (8.2 corrected) stable; no further repletion.  MIVF reduced to D5NS @75 >50 ml/hr yesterday; will further reduce  to meet daily requirement.  Add standard MVI and trace elements to TPN  Continue sensitive q4h SSI and adjust as needed   Monitor TPN labs on Mon/Thurs    Lorna Dibble, PharmD 09/02/2020 8:03 AM

## 2020-09-02 NOTE — Progress Notes (Signed)
PROGRESS NOTE   HPI was taken from Dr. Flossie Buffy: Mr. Heilbrun is a 60 year old gentleman who presented with 5 days of worsening abdominal pain, nausea and vomiting.  In the ER he was noted to be in severe sepsis with tachycardia, tachypnea, leukocytosis with left shift and lactate of 4.3.  Also had AKI with creatinine of 1.62 from prior of 0.63 a year ago. CT abdomen revealed cecal volvulus and he was admitted by surgery for expiratory laparotomy with resulting right colectomy.  Postoperatively, patient was noted to be in possible SVT and had EKG with documented atrial fibrillation with RVR.  He was given 15mg  of diltiazem x2 in the PACU and hospitalist was called for consultation.   On my evaluation, patient continues to be in atrial fibrillation with RVR with rates ranging from 1 20-1 40.  He appears to be in discomfort from his NG tube but otherwise denies any chest pain, shortness of breath or palpitation.  He has history of hypertension but no other cardiac history.  He reports heavy alcohol use of 4-5 beers daily with last drink about 3 days ago.  Hospital course from Dr. 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 Pt developed new onset a. fib and has been on IV dilt drip while pt is NPO. Currently pt is on TPN and NG tube but NG tube was clamped today and may be taken out. Pt can be switched to po diltiazem when able to swallow po meds. Primary service is general surg      MICO KNEIFL  [id number] DOB: 1960-09-12 DOA: 08/30/2020 PCP: Margo Common, PA-C   Assessment & Plan:   Active Problems:   Essential (primary) hypertension   Cecal volvulus (HCC)   Atrial fibrillation with RVR (HCC)   AKI (acute kidney injury) (HCC)   A. fib: w/ RVR which has resolved. New onset. Continue on IV diltiazem drip until pt can swallow/take po meds. CHA2DSVASc score of 1, so will hold off on anticoagulation. 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 Management per primary surgical team   AKI: resolved   Alcohol abuse: drinks 4-5 beers daily.  Last drink about 3 days ago with no signs of withdrawal but may need CIWA protocol  Hyponatremia: labile. Will continue to monitor   Hypokalemia: WNL today. Will continue to monitor   Hypophosphatemia: WNL today. Will continue to monitor   DVT prophylaxis: lovenox  Code Status: full  Family Communication: discussed pt's care w/ pt's family at bedside and answered their questions  Disposition Plan: unclear,As per primary surg team   Level of care: Progressive Cardiac   Status is: Inpatient  Remains inpatient appropriate because:Ongoing diagnostic testing needed not appropriate for outpatient work up, Unsafe d/c plan, IV treatments appropriate due to intensity of illness or inability to take PO and Inpatient level of care appropriate due to severity of illness   Dispo: The patient is from: Home              Anticipated d/c is to: as per primary surgery team               Patient currently is not medically stable to d/c.   Difficult to place patient Yes    Consultants:   hospitalist    Procedures:  Antimicrobials: zosyn   Subjective: Pt c/o gas and small bowel movement today   Objective: Vitals:   09/01/20 1611 09/01/20 2005 09/02/20 0432 09/02/20 0700  BP: 131/78 (!) 144/84 (!) 145/90   Pulse: 92 88 81   Resp: 18 20 16    Temp: (!) 97.5 F (36.4 C) 97.9 F (36.6 C) 97.6 F (36.4 C)   TempSrc: Oral Oral    SpO2: 96% 97% 97%   Weight:    81.6 kg  Height:        Intake/Output Summary (Last 24 hours) at 09/02/2020 0809 Last data filed at 09/02/2020 0700 Gross per 24 hour  Intake 2166.78 ml  Output 2490 ml  Net -323.22 ml   Filed Weights   08/30/20 0857 08/31/20 1000 09/02/20 0700  Weight:  89.8 kg 88.7 kg 81.6 kg    Examination:  General exam: Appears comfortable. NG tube in place Respiratory system: clear breath sounds b/. No rales Cardiovascular system: irregularly irregular. No rubs or gallops   Gastrointestinal system: Abd is soft, NT, ND & hypoactive bowel sounds  Central nervous system: Alert and oriented. Moves all extremities  Psychiatry: judgement and insight appear normal. Appropriate mood and affect    Data Reviewed: I have personally reviewed following labs and imaging studies  CBC: Recent Labs  Lab 08/30/20 0907 08/31/20 0135 09/01/20 0618  WBC 11.8* 7.6 9.0  NEUTROABS 9.2*  --  5.9  HGB 16.1 12.4* 10.8*  HCT 43.8 33.8* 31.7*  MCV 91.8 91.8 99.1  PLT 326 206 601   Basic Metabolic Panel: Recent Labs  Lab 08/30/20 0907 08/30/20 1014 08/30/20 2110 08/31/20 0135 09/01/20 0618 09/02/20 0710  NA 125*  --  127* 129* 132* 131*  K 3.3*  --  3.4* 3.4* 3.4* 3.7  CL 75*  --  88* 92* 101 100  CO2 28  --  27 27 25 24   GLUCOSE 177*  --  127* 127* 109* 112*  BUN 72*  --  52* 39* 19 10  CREATININE 1.62* 1.80* 1.15 0.93 0.60* 0.55*  CALCIUM 9.9  --  7.8* 7.6* 7.6* 8.2*  MG 2.2  --  1.8 1.9 2.3 2.0  PHOS  --   --   --   --  1.4* 2.6   GFR: Estimated Creatinine Clearance: 111 mL/min (A) (by C-G formula based on SCr of 0.55 mg/dL (L)). Liver Function Tests: Recent Labs  Lab 08/30/20 0907 09/01/20 0618  AST 54* 19  ALT 55* 28  ALKPHOS 54 27*  BILITOT 1.5* 0.8  PROT 8.0 5.5*  ALBUMIN 4.6 2.7*   No results for input(s): LIPASE, AMYLASE in the last 168 hours. No results for input(s): AMMONIA in the last 168 hours. Coagulation Profile: Recent Labs  Lab 08/30/20 0907  INR 0.9   Cardiac Enzymes: No results for input(s): CKTOTAL, CKMB, CKMBINDEX, TROPONINI in the last 168 hours. BNP (last 3 results) No results for input(s): PROBNP in the last 8760 hours. HbA1C: Recent Labs    08/31/20 1138  HGBA1C 5.4   CBG: Recent Labs  Lab  09/01/20 1613 09/01/20 2124 09/02/20 0133 09/02/20 0429 09/02/20 0753  GLUCAP 145* 133* 129* 142* 127*   Lipid Profile: Recent Labs    09/01/20 0619  TRIG 48   Thyroid Function Tests: Recent Labs    08/30/20 2110  TSH 0.864   Anemia Panel: No results for input(s): VITAMINB12, FOLATE, FERRITIN, TIBC, IRON, RETICCTPCT in the last 72 hours. Sepsis Labs: Recent Labs  Lab 08/30/20 0932 08/30/20  1206 08/30/20 2251 08/31/20 0135  LATICACIDVEN 4.3* 2.2* 1.4 1.2    Recent Results (from the past 240 hour(s))  Resp Panel by RT-PCR (Flu A&B, Covid) Nasopharyngeal Swab     Status: None   Collection Time: 08/30/20  9:08 AM   Specimen: Nasopharyngeal Swab; Nasopharyngeal(NP) swabs in vial transport medium  Result Value Ref Range Status   SARS Coronavirus 2 by RT PCR NEGATIVE NEGATIVE Final    Comment: (NOTE) SARS-CoV-2 target nucleic acids are NOT DETECTED.  The SARS-CoV-2 RNA is generally detectable in upper respiratory specimens during the acute phase of infection. The lowest concentration of SARS-CoV-2 viral copies this assay can detect is 138 copies/mL. A negative result does not preclude SARS-Cov-2 infection and should not be used as the sole basis for treatment or other patient management decisions. A negative result may occur with  improper specimen collection/handling, submission of specimen other than nasopharyngeal swab, presence of viral mutation(s) within the areas targeted by this assay, and inadequate number of viral copies(<138 copies/mL). A negative result must be combined with clinical observations, patient history, and epidemiological information. The expected result is Negative.  Fact Sheet for Patients:  EntrepreneurPulse.com.au  Fact Sheet for Healthcare Providers:  IncredibleEmployment.be  This test is no t yet approved or cleared by the [place] FDA and  has been authorized for detection and/or diagnosis of  SARS-CoV-2 by FDA under an Emergency Use Authorization (EUA). This EUA will remain  in effect (meaning this test can be used) for the duration of the COVID-19 declaration under Section 564(b)(1) of the Act, 21 U.S.C.section 360bbb-3(b)(1), unless the authorization is terminated  or revoked sooner.       Influenza A by PCR NEGATIVE NEGATIVE Final   Influenza B by PCR NEGATIVE NEGATIVE Final    Comment: (NOTE) The Xpert Xpress SARS-CoV-2/FLU/RSV plus assay is intended as an aid in the diagnosis of influenza from Nasopharyngeal swab specimens and should not be used as a sole basis for treatment. Nasal washings and aspirates are unacceptable for Xpert Xpress SARS-CoV-2/FLU/RSV testing.  Fact Sheet for Patients: EntrepreneurPulse.com.au  Fact Sheet for Healthcare Providers: IncredibleEmployment.be  This test is not yet approved or cleared by the [place] FDA and has been authorized for detection and/or diagnosis of SARS-CoV-2 by FDA under an Emergency Use Authorization (EUA). This EUA will remain in effect (meaning this test can be used) for the duration of the COVID-19 declaration under Section 564(b)(1) of the Act, 21 U.S.C. section 360bbb-3(b)(1), unless the authorization is terminated or revoked.  Performed at [facility], [address] [place], [place] [address]   Blood culture (single)     Status: None (Preliminary result)   Collection Time: 08/30/20  9:09 AM   Specimen: BLOOD  Result Value Ref Range Status   Specimen Description BLOOD LEFT ANTECUBITAL  Final   Special Requests   Final    BOTTLES DRAWN AEROBIC AND ANAEROBIC Blood Culture results may not be optimal due to an excessive volume of blood received in culture bottles   Culture   Final    NO GROWTH 3 DAYS Performed at [facility], [address], [place], [place] [address]    Report Status PENDING  Incomplete         Radiology Studies: DG  Chest Port 1 View  Result Date: 08/31/2020 CLINICAL DATA:  Peripherally inserted central catheter EXAM: PORTABLE CHEST 1 VIEW COMPARISON:  08/30/2020 FINDINGS: Right-sided PICC line tip is in the mid SVC. Cardiomediastinal contours are normal.  Esophageal catheter side port projects within the stomach. Normal pleural spaces. IMPRESSION: PICC line tip in the mid SVC. Electronically Signed   By: Ulyses Jarred M.D.   On: 08/31/2020 19:18   ECHOCARDIOGRAM COMPLETE  Result Date: 09/01/2020    ECHOCARDIOGRAM REPORT   Patient Name:   NUMA SCHROETER Date of Exam: 08/31/2020 Medical Rec #:  [id number]     Height:       73.0 in Accession #:    [id number]    Weight:       198.0 lb Date of Birth:  [birth year]     BSA:          2.142 m Patient Age:    77 years      BP:           118/86 mmHg Patient Gender: M             HR:           76 bpm. Exam Location:  [facility] Procedure: 2D Echo, Cardiac Doppler and Color Doppler Indications:     Atrial Fibrillation I48.91  History:         Patient has no prior history of Echocardiogram examinations.                  Risk Factors:Hypertension.  Sonographer:     Alyse Low Roar Referring Phys:  [id number] [place] T TU Diagnosing Phys: Neoma Laming MD IMPRESSIONS  1. Left ventricular ejection fraction, by estimation, is 50 to 55%. The left ventricle has low normal function. The left ventricle has no regional wall motion abnormalities. There is mild concentric left ventricular hypertrophy. Left ventricular diastolic parameters are consistent with Grade I diastolic dysfunction (impaired relaxation).  2. Right ventricular systolic function is normal. The right ventricular size is normal.  3. The mitral valve is normal in structure. Trivial mitral valve regurgitation. No evidence of mitral stenosis.  4. The aortic valve is normal in structure. Aortic valve regurgitation is not visualized. No aortic stenosis is present.  5. The inferior vena cava is normal in size with greater than 50% respiratory  variability, suggesting right atrial pressure of 3 mmHg. FINDINGS  Left Ventricle: Left ventricular ejection fraction, by estimation, is 50 to 55%. The left ventricle has low normal function. The left ventricle has no regional wall motion abnormalities. The left ventricular internal cavity size was normal in size. There is mild concentric left ventricular hypertrophy. Left ventricular diastolic parameters are consistent with Grade I diastolic dysfunction (impaired relaxation). Right Ventricle: The right ventricular size is normal. No increase in right ventricular wall thickness. Right ventricular systolic function is normal. Left Atrium: Left atrial size was normal in size. Right Atrium: Right atrial size was normal in size. Pericardium: There is no evidence of pericardial effusion. Mitral Valve: The mitral valve is normal in structure. Trivial mitral valve regurgitation. No evidence of mitral valve stenosis. Tricuspid Valve: The tricuspid valve is normal in structure. Tricuspid valve regurgitation is trivial. No evidence of tricuspid stenosis. Aortic Valve: The aortic valve is normal in structure. Aortic valve regurgitation is not visualized. No aortic stenosis is present. Aortic valve peak gradient measures 7.4 mmHg. Pulmonic Valve: The pulmonic valve was normal in structure. Pulmonic valve regurgitation is not visualized. No evidence of pulmonic stenosis. Aorta: The aortic root is normal in size and structure. Venous: The inferior vena cava is normal in size with greater than 50% respiratory variability, suggesting right atrial pressure of 3 mmHg. IAS/Shunts: No atrial level shunt  detected by color flow Doppler.  LEFT VENTRICLE PLAX 2D LVIDd:         4.37 cm  Diastology LVIDs:         3.19 cm  LV e' medial:    8.92 cm/s LV PW:         1.13 cm  LV E/e' medial:  10.6 LV IVS:        1.46 cm  LV e' lateral:   14.40 cm/s LVOT diam:     2.30 cm  LV E/e' lateral: 6.6 LVOT Area:     4.15 cm  RIGHT VENTRICLE RV Mid diam:     2.94 cm RV S prime:     11.40 cm/s TAPSE (M-mode): 1.6 cm LEFT ATRIUM           Index       RIGHT ATRIUM           Index LA diam:      4.00 cm 1.87 cm/m  RA Area:     18.00 cm LA Vol (A4C): 69.4 ml 32.39 ml/m RA Volume:   42.60 ml  19.88 ml/m  AORTIC VALVE                PULMONIC VALVE AV Area (Vmax): 3.09 cm    PV Vmax:        0.98 m/s AV Vmax:        136.00 cm/s PV Peak grad:   3.8 mmHg AV Peak Grad:   7.4 mmHg    RVOT Peak grad: 2 mmHg LVOT Vmax:      101.00 cm/s  AORTA Ao Root diam: 3.10 cm MITRAL VALVE               TRICUSPID VALVE MV Area (PHT): 2.87 cm    TR Peak grad:   23.4 mmHg MV Decel Time: 264 msec    TR Vmax:        242.00 cm/s MV E velocity: 94.70 cm/s                            SHUNTS                            Systemic Diam: 2.30 cm Neoma Laming MD Electronically signed by Neoma Laming MD Signature Date/Time: 09/01/2020/11:04:23 AM    Final    [place] EKG SITE RITE  Result Date: 08/31/2020 If Site Rite image not attached, placement could not be confirmed due to current cardiac rhythm.       Scheduled Meds: . Chlorhexidine Gluconate Cloth  6 each Topical Daily  . enoxaparin (LOVENOX) injection  40 mg Subcutaneous Q24H  . insulin aspart  0-9 Units Subcutaneous Q4H  . pantoprazole (PROTONIX) IV  40 mg Intravenous QHS  . sodium chloride flush  10-40 mL Intracatheter Q12H   Continuous Infusions: . dextrose 5 % and 0.9% NaCl 50 mL/hr at 09/01/20 1637  . diltiazem (CARDIZEM) infusion 5 mg/hr (09/01/20 0745)  . piperacillin-tazobactam (ZOSYN)  IV 3.375 g (09/02/20 0144)  . TPN ADULT (ION) 80 mL/hr at 09/01/20 1752     LOS: 3 days    Time spent: 31 mins     Wyvonnia Dusky, MD [facility] Pager [phone]  If 7PM-7AM, please contact night-coverage 09/02/2020, 8:09 AM

## 2020-09-02 NOTE — Progress Notes (Signed)
09/02/2020  Subjective: Patient is 3 Days Post-Op exlap and right colectomy for cecal volvulus.  No acute events.  Patient had a bowel movement last night and had flatus.  Reports soreness in the incision but no worsening pain.  WBC normal, creatinine normal, electrolytes improving.  Vital signs: Temp:  [97.6 F (36.4 C)-98.1 F (36.7 C)] 97.6 F (36.4 C) (04/26 1600) Pulse Rate:  [59-93] 59 (04/26 1600) Resp:  [16-24] 18 (04/26 1600) BP: (121-172)/(77-94) 121/77 (04/26 1600) SpO2:  [97 %-100 %] 99 % (04/26 1600) Weight:  [81.6 kg] 81.6 kg (04/26 0700)   Intake/Output: 04/25 0701 - 04/26 0700 In: 2166.8 [I.V.:1740.3; IV Piggyback:426.5] Out: 2490 [Urine:2200; Emesis/NG output:200; Drains:90] Last BM Date: 09/02/20  Physical Exam: Constitutional:  No acute distress Abdomen:  Soft, non-distended, appropriately tender to palpation.  Midline incision is clean, dry, intact with staples.  Dressing removed.  Blake drain with serosanguinous fluid.  Labs:  Recent Labs    09/01/20 0618 09/02/20 0753  WBC 9.0 9.1  HGB 10.8* 11.3*  HCT 31.7* 34.5*  PLT 234 260   Recent Labs    09/01/20 0618 09/02/20 0710  NA 132* 131*  K 3.4* 3.7  CL 101 100  CO2 25 24  GLUCOSE 109* 112*  BUN 19 10  CREATININE 0.60* 0.55*  CALCIUM 7.6* 8.2*   No results for input(s): LABPROT, INR in the last 72 hours.  Imaging: No results found.  Assessment/Plan: This is a 60 y.o. male s/p exlap and right colectomy.  --Patient is regaining bowel function.  Will clamp NG tube and check residual in 4 hrs.  If < 150 ml, will d/c NG and start clear liquids. --Continue TPN for now until he's having adequate po nutrition --Continue dilt drip for afib - remains rate controlled. --DVT prophylaxis   Melvyn Neth, [facility]

## 2020-09-03 ENCOUNTER — Encounter: Payer: Self-pay | Admitting: Surgery

## 2020-09-03 LAB — GLUCOSE, CAPILLARY
Glucose-Capillary: 160 mg/dL — ABNORMAL HIGH (ref 70–99)
Glucose-Capillary: 133 mg/dL — ABNORMAL HIGH (ref 70–99)
Glucose-Capillary: 162 mg/dL — ABNORMAL HIGH (ref 70–99)
Glucose-Capillary: 130 mg/dL — ABNORMAL HIGH (ref 70–99)
Glucose-Capillary: 134 mg/dL — ABNORMAL HIGH (ref 70–99)
Glucose-Capillary: 125 mg/dL — ABNORMAL HIGH (ref 70–99)

## 2020-09-03 LAB — BASIC METABOLIC PANEL
Anion gap: 7 (ref 5–15)
BUN: 11 mg/dL (ref 6–20)
CO2: 23 mmol/L (ref 22–32)
Calcium: 8.3 mg/dL — ABNORMAL LOW (ref 8.9–10.3)
Chloride: 99 mmol/L (ref 98–111)
Creatinine, Ser: 0.56 mg/dL — ABNORMAL LOW (ref 0.61–1.24)
GFR, Estimated: >60 mL/min (ref >60)
Glucose, Bld: 122 mg/dL — ABNORMAL HIGH (ref 70–99)
Potassium: 3.7 mmol/L (ref 3.5–5.1)
Sodium: 129 mmol/L — ABNORMAL LOW (ref 135–145)

## 2020-09-03 LAB — PHOSPHORUS: Phosphorus: 3.9 mg/dL (ref 2.5–4.6)

## 2020-09-03 LAB — MAGNESIUM: Magnesium: 2 mg/dL (ref 1.7–2.4)

## 2020-09-03 LAB — BRAIN NATRIURETIC PEPTIDE: B Natriuretic Peptide: 436.6 pg/mL — ABNORMAL HIGH (ref 0.0–100.0)

## 2020-09-03 MED ORDER — METOPROLOL TARTRATE 5 MG/5ML IV SOLN
5.0000 mg | Freq: Once | INTRAVENOUS | Status: DC | PRN
  Filled 2020-09-03: qty 5

## 2020-09-03 MED ORDER — TRAVASOL 10 % IV SOLN
INTRAVENOUS | Status: AC
  Filled 2020-09-03: qty 554.4

## 2020-09-03 MED ORDER — METOPROLOL TARTRATE 25 MG PO TABS
25.0000 mg | ORAL_TABLET | Freq: Two times a day (BID) | ORAL | Status: DC
  Administered 2020-09-03 (×2): 25 mg via ORAL
  Filled 2020-09-03 (×2): qty 1

## 2020-09-03 MED ORDER — ENSURE ENLIVE PO LIQD
237.0000 mL | Freq: Two times a day (BID) | ORAL | Status: DC
  Administered 2020-09-03 – 2020-09-05 (×3): 237 mL via ORAL

## 2020-09-03 MED ORDER — DILTIAZEM HCL 60 MG PO TABS
60.0000 mg | ORAL_TABLET | Freq: Three times a day (TID) | ORAL | Status: DC
  Administered 2020-09-03 – 2020-09-04 (×3): 60 mg via ORAL
  Filled 2020-09-03 (×4): qty 1
  Filled 2020-09-03 (×3): qty 2

## 2020-09-03 NOTE — Progress Notes (Signed)
[facility] Hospital Day(s): 4.   Post op day(s): 4 Days Post-Op.   Interval History:  Patient seen and examined No acute events or new complaints overnight.  Patient reports he continues to feel better; still with incisional soreness No fever, chills, nausea, emesis He continues to maintain his renal function; sCr - 0.56; UO - 2.5L He continues to have mild hyponatremia to 129; no other electrolyte derangements NGT removed yesterday Surgical drain with 70 ccs out; serous He continues on TPN Started on CLD; tolerating well He continues to have bowel function; passing flatus Has not ambulated outside of the room; plans to do so today  Vital signs in last 24 hours: [min-max] current  Temp:  [97.5 F (36.4 C)-98.1 F (36.7 C)] 97.7 F (36.5 C) (04/27 0600) Pulse Rate:  [59-114] 79 (04/27 0600) Resp:  [18-24] 22 (04/27 0700) BP: (117-172)/(71-105) 117/75 (04/27 0639) SpO2:  [97 %-100 %] 99 % (04/27 0600)     Height: 6\' 1"  (185.4 cm) Weight: 81.6 kg BMI (Calculated): 23.75   Intake/Output last 2 shifts:  04/26 0701 - 04/27 0700 In: 5626.2 [P.O.:340; I.V.:4801.8; IV Piggyback:484.4] Out: 2835 [Urine:2500; Emesis/NG output:265; Drains:70]   Physical Exam:  Constitutional: alert, cooperative and no distress  Respiratory: breathing non-labored at rest, on [place] Cardiovascular: regular rate, irregularly irregular  Gastrointestinal: Soft, incisional soreness, non-distended, no rebound/guarding, surgical drain in place with serous output Integumentary: Laparotomy incision is CDI with staples, no erythema, no drainage   Labs:  CBC Latest Ref Rng & Units 09/02/2020 09/01/2020 08/31/2020  WBC 4.0 - 10.5 K/uL 9.1 9.0 7.6  Hemoglobin 13.0 - 17.0 g/dL 11.3(L) 10.8(L) 12.4(L)  Hematocrit 39.0 - 52.0 % 34.5(L) 31.7(L) 33.8(L)  Platelets 150 - 400 K/uL 260 234 206   CMP Latest Ref Rng & Units 09/03/2020 09/02/2020 09/01/2020  Glucose 70 - 99 mg/dL 122(H)  112(H) 109(H)  BUN 6 - 20 mg/dL 11 10 19   Creatinine 0.61 - 1.24 mg/dL 0.56(L) 0.55(L) 0.60(L)  Sodium 135 - 145 mmol/L 129(L) 131(L) 132(L)  Potassium 3.5 - 5.1 mmol/L 3.7 3.7 3.4(L)  Chloride 98 - 111 mmol/L 99 100 101  CO2 22 - 32 mmol/L 23 24 25   Calcium 8.9 - 10.3 mg/dL 8.3(L) 8.2(L) 7.6(L)  Total Protein 6.5 - 8.1 g/dL - - 5.5(L)  Total Bilirubin 0.3 - 1.2 mg/dL - - 0.8  Alkaline Phos 38 - 126 U/L - - 27(L)  AST 15 - 41 U/L - - 19  ALT 0 - 44 U/L - - 28     Imaging studies: No new pertinent imaging studies   Assessment/Plan:  61 y.o. male with return of bowel function 4 Days Post-Op s/p exploratory laparotomy and right colectomy for cecal volvulus, complicated by pertinent comorbidities including new onset atrial fibrillation currently rate controlled.   - Advance to full liquid diet this morning. If doing well, okay for soft diet this evening  - Wean TPN to 1/2 rate today; hopefully discontinue tomorrow if toelrating diet advancement  - Monitor abdominal examination; on-going bowel function             - Pain control prn; antiemetics prn              - Mobilization as tolerated; plans to ambulate with staff today; low threshold to engage PT             - Replete electrolytes; monitor; improving              -  continue CCB for atrial fibrillation; now tolerating PO can transition to PO medications             - Appreciate medicine assistance  All of the above findings and recommendations were discussed with the patient, patient's family (wife at bedside), and the medical team, and all of patient's and family's questions were answered to their expressed satisfaction.  -- Edison Simon, PA-C [facility] 09/03/2020, 7:37 AM [phone] M-F: 7am - 4pm

## 2020-09-03 NOTE — Plan of Care (Signed)

## 2020-09-03 NOTE — Progress Notes (Addendum)
Progress Note    Henry Hill  OIZ:[id number] DOB: Mar 18, 1961  DOA: 08/30/2020 PCP: Margo Common, PA-C      Brief Narrative:    Medical records reviewed and are as summarized below:  Henry Hill is a 60 y.o. 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    Atrial fibrillation with RVR: Heart rate is better.  Taper off IV Cardizem infusion and start oral Cardizem at 60 mg every 8 hours. 2D echo showed EF estimated at 50 to 33%, grade 1 diastolic dysfunction.  CHA2DS2-VASc score:1  Cecal volvulus: Status post exploratory laparotomy and right colectomy on 08/30/2020.  He is on TPN for nutrition.  Follow-up with general surgeon.  Elevated troponins: Likely due to demand ischemia  Hypertension: Start oral Cardizem.  Patient was taking nifedipine at home.  Acute on chronic hyponatremia: Asymptomatic.  Monitor BMP.  AKI, hypokalemia, hypophosphatemia: Resolved  Diet Order            Diet full liquid Room service appropriate? Yes; Fluid consistency: Thin  Diet effective now                    Consultants:  General surgeon  Procedures:  Ex lap, right colectomy on 08/30/2020    Medications:   . Chlorhexidine Gluconate Cloth  6 each Topical Daily  . diltiazem  60 mg Oral Q8H  . enoxaparin (LOVENOX) injection  40 mg Subcutaneous Q24H  . feeding supplement  237 mL Oral BID BM  . insulin aspart  0-9 Units Subcutaneous Q4H  . pantoprazole (PROTONIX) IV  40 mg Intravenous QHS  . sodium chloride flush  10-40 mL Intracatheter Q12H   Continuous Infusions: . piperacillin-tazobactam (ZOSYN)  IV 3.375 g (09/03/20 0911)  . TPN ADULT (ION) 110 mL/hr at 09/03/20 0600  . TPN ADULT  (ION)       Anti-infectives (From admission, onward)   Start     Dose/Rate Route Frequency Ordered Stop   08/30/20 1800  piperacillin-tazobactam (ZOSYN) IVPB 3.375 g        3.375 g 12.5 mL/hr over 240 Minutes Intravenous Every 8 hours 08/30/20 1321     08/30/20 0945  piperacillin-tazobactam (ZOSYN) IVPB 3.375 g        3.375 g 100 mL/hr over 30 Minutes Intravenous  Once 08/30/20 8250 08/30/20 1108             Family Communication/Anticipated D/C date and plan/Code Status   DVT prophylaxis: enoxaparin (LOVENOX) injection 40 mg Start: 08/31/20 2000 SCDs Start: 08/31/20 1233 SCDs Start: 08/30/20 1304     Code Status: Full Code  Family Communication: Wife at the bedside Disposition Plan:    Status is: Inpatient  Remains inpatient appropriate because:Inpatient level of care appropriate due to severity of illness   Dispo: The patient is from: Home              Anticipated d/c is to: Home              Patient currently is not medically stable to d/c.   Difficult to place patient No           Subjective:   Interval events noted.  Abdomen feels sore.  No chest pain, shortness of breath or palpitations.  He tolerated his breakfast (grits) this morning.  Objective:    Vitals:   09/03/20 0600 09/03/20 0639 09/03/20 0700 09/03/20 0749  BP: (!) 132/105 117/75  (!) 141/86  Pulse: 79   79  Resp: (!) 22  (!) 22 18  Temp: 97.7 F (36.5 C)   (!) 97.4 F (36.3 C)  TempSrc: Oral   Oral  SpO2: 99%   99%  Weight:      Height:       No data found.   Intake/Output Summary (Last 24 hours) at 09/03/2020 1130 Last data filed at 09/03/2020 0900 Gross per 24 hour  Intake 5746.17 ml  Output 2195 ml  Net 3551.17 ml   Filed Weights   08/30/20 0857 08/31/20 1000 09/02/20 0700  Weight: 89.8 kg 88.7 kg 81.6 kg    Exam:  GEN: NAD SKIN: No rash EYES: EOMI ENT: MMM CV: RRR PULM: CTA B ABD: soft, ND, mild surgical tenderness, +BS, + surgical incision looks dry,  clean and intact with staples in place. CNS: AAO x 3, non focal EXT: No edema or tenderness     Data Reviewed:   I have personally reviewed following labs and imaging studies:  Labs: Labs show the following:   Basic Metabolic Panel: Recent Labs  Lab 08/30/20 2110 08/31/20 0135 09/01/20 0618 09/02/20 0710 09/03/20 0649  NA 127* 129* 132* 131* 129*  K 3.4* 3.4* 3.4* 3.7 3.7  CL 88* 92* 101 100 99  CO2 27 27 25 24 23   GLUCOSE 127* 127* 109* 112* 122*  BUN 52* 39* 19 10 11   CREATININE 1.15 0.93 0.60* 0.55* 0.56*  CALCIUM 7.8* 7.6* 7.6* 8.2* 8.3*  MG 1.8 1.9 2.3 2.0 2.0  PHOS  --   --  1.4* 2.6 3.9   GFR Estimated Creatinine Clearance: 111 mL/min (A) (by C-G formula based on SCr of 0.56 mg/dL (L)). Liver Function Tests: Recent Labs  Lab 08/30/20 0907 09/01/20 0618  AST 54* 19  ALT 55* 28  ALKPHOS 54 27*  BILITOT 1.5* 0.8  PROT 8.0 5.5*  ALBUMIN 4.6 2.7*   No results for input(s): LIPASE, AMYLASE in the last 168 hours. No results for input(s): AMMONIA in the last 168 hours. Coagulation profile Recent Labs  Lab 08/30/20 0907  INR 0.9    CBC: Recent Labs  Lab 08/30/20 0907 08/31/20 0135 09/01/20 0618 09/02/20 0753  WBC 11.8* 7.6 9.0 9.1  NEUTROABS 9.2*  --  5.9  --   HGB 16.1 12.4* 10.8* 11.3*  HCT 43.8 33.8* 31.7* 34.5*  MCV 91.8 91.8 99.1 110.2*  PLT 326 206 234 260   Cardiac Enzymes: No results for input(s): CKTOTAL, CKMB, CKMBINDEX, TROPONINI in the last 168 hours. BNP (last 3 results) No results for input(s): PROBNP in the last 8760 hours. CBG: Recent Labs  Lab 09/02/20 1602 09/02/20 2049 09/02/20 2355 09/03/20 0443 09/03/20 0745  GLUCAP 122* 151* 131* 160* 133*   D-Dimer: No results for input(s): DDIMER in the last 72 hours. Hgb A1c: Recent Labs    08/31/20 1138  HGBA1C 5.4   Lipid Profile: Recent Labs    09/01/20 0619  TRIG 48   Thyroid function studies: No results for input(s): TSH, T4TOTAL, T3FREE, THYROIDAB in the  last 72 hours.  Invalid input(s): FREET3 Anemia work up: No results for input(s): VITAMINB12, FOLATE, FERRITIN, TIBC, IRON, RETICCTPCT in the last 72 hours. Sepsis Labs: Recent Labs  Lab 08/30/20 0907 08/30/20 0909 08/30/20 1206 08/30/20 2251 08/31/20 0135 09/01/20 0618 09/02/20 0753  WBC 11.8*  --   --   --  7.6 9.0 9.1  LATICACIDVEN  --  4.3* 2.2* 1.4 1.2  --   --     Microbiology Recent Results (from the past 240 hour(s))  Resp Panel by RT-PCR (Flu A&B, Covid) Nasopharyngeal Swab     Status: None   Collection Time: 08/30/20  9:08 AM   Specimen: Nasopharyngeal Swab; Nasopharyngeal(NP) swabs in vial transport medium  Result Value Ref Range Status   SARS Coronavirus 2 by RT PCR NEGATIVE NEGATIVE Final    Comment: (NOTE) SARS-CoV-2 target nucleic acids are NOT DETECTED.  The SARS-CoV-2 RNA is generally detectable in upper respiratory specimens during the acute phase of infection. The lowest concentration of SARS-CoV-2 viral copies this assay can detect is 138 copies/mL. A negative result does not preclude SARS-Cov-2 infection and should not be used as the sole basis for treatment or other patient management decisions. A negative result may occur with  improper specimen collection/handling, submission of specimen other than nasopharyngeal swab, presence of viral mutation(s) within the areas targeted by this assay, and inadequate number of viral copies(<138 copies/mL). A negative result must be combined with clinical observations, patient history, and epidemiological information. The expected result is Negative.  Fact Sheet for Patients:  EntrepreneurPulse.com.au  Fact Sheet for Healthcare Providers:  IncredibleEmployment.be  This test is no t yet approved or cleared by the [place] FDA and  has been authorized for detection and/or diagnosis of SARS-CoV-2 by FDA under an Emergency Use Authorization (EUA). This EUA will remain  in  effect (meaning this test can be used) for the duration of the COVID-19 declaration under Section 564(b)(1) of the Act, 21 U.S.C.section 360bbb-3(b)(1), unless the authorization is terminated  or revoked sooner.       Influenza A by PCR NEGATIVE NEGATIVE Final   Influenza B by PCR NEGATIVE NEGATIVE Final    Comment: (NOTE) The Xpert Xpress SARS-CoV-2/FLU/RSV plus assay is intended as an aid in the diagnosis of influenza from Nasopharyngeal swab specimens and should not be used as a sole basis for treatment. Nasal washings and aspirates are unacceptable for Xpert Xpress SARS-CoV-2/FLU/RSV testing.  Fact Sheet for Patients: EntrepreneurPulse.com.au  Fact Sheet for Healthcare Providers: IncredibleEmployment.be  This test is not yet approved or cleared by the [place] FDA and has been authorized for detection and/or diagnosis of SARS-CoV-2 by FDA under an Emergency Use Authorization (EUA). This EUA will remain in effect (meaning this test can be used) for the duration of the COVID-19 declaration under Section 564(b)(1) of the Act, 21 U.S.C. section 360bbb-3(b)(1), unless the authorization is terminated or revoked.  Performed at [facility], [address] [place], [place] [address]   Blood culture (single)     Status: None (Preliminary result)   Collection Time: 08/30/20  9:09 AM   Specimen: BLOOD  Result Value Ref Range Status   Specimen Description BLOOD LEFT ANTECUBITAL  Final   Special Requests   Final    BOTTLES DRAWN AEROBIC AND ANAEROBIC Blood Culture results may not be optimal due to an excessive volume of blood received in culture bottles   Culture   Final    NO GROWTH 4 DAYS Performed at [facility], [address] [place], [place] [address]    Report Status PENDING  Incomplete    Procedures and diagnostic studies:  No results found.  LOS: 4 days   Lillyen Schow  [facility]   Pager on [email or web address]. If 7PM-7AM, please contact night-coverage at [email or web address]     09/03/2020, 11:30 AM

## 2020-09-03 NOTE — Progress Notes (Signed)
PHARMACY - TOTAL PARENTERAL NUTRITION CONSULT NOTE   Indication: Prolonged ileus  Patient Measurements: Height: '6\' 1"'  (185.4 cm) Weight: 81.6 kg (180 lb) IBW/kg (Calculated) : 79.9 TPN AdjBW (KG): 89.8 Body mass index is 23.75 kg/m.  Assessment:  60 y.o. male presenting with a 5 day history of nausea, vomiting and 3 day history of worsening abdominal pain.  He has been unable to keep solid po intake, but was able to drink some fluids.  However, given the worsening pain, he presented today to the ER for evaluation.  In the ER, he has remained tachycardic, with abdominal distention. Pharmacy has been consulted for TPN management.   Glucose / Insulin: +SSI (7 u/24h) 24h BG 112-160 (a1c 5.4%) Electrolytes:  Na 131>129 (low/stable) K 3.7>3.7 (WNL) Phos: 2.6>3.9 (WNL) Corrected calcium 8.8>8.9, (WNL) - all others WNL; Triglycerides also returned WNL (74m/dL on 4/25) Renal: Scr 0.55>0.56 (stable) Hepatic: AST 54>19, ALT 55>28 (stable) Intake / Output; MIVF: NONE currently Net I/O = (+) 13026mGI Imaging: 4/23 CT abdomen: high grade bowel obstruction. 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  4/23 Xray abdomen: bowel obstruction GI Surgeries / Procedures:  4/23 exploratory laparotomy with right coloctomy   Central access: Placement on 4/24  TPN start date: 4/24>>  Nutritional Goals (per [facility] TPN Handbook): kCal: 2700 kcal/day, Protein: 110 g/day, Fluid: 2.7 L/day Goal TPN rate is 110 mL/hr (provides 110 g of protein and 2723 kcals per day)  Current Nutrition:  NPO  Plan:  -- Weaning to 50% goal rate; plan for off tomorrow if tolerating diet advancement (CLD>FLD).  Decrease TPN rate to 55 mL/hr at 1800  Electrolytes in TPN: Na 5062mL, K 40m21m, Ca 5mEq36m Mg 5mEq/80mand Phos 15mmol70mCl:Ac 1:1  No additional electrolyte repletion today. Will monitor sodium and fluid status.  MIVF was removed 4/26 as needs were met by TPN alone; will re-evaluate now that TPN rate reduced to  50%.  Add standard MVI and trace elements to TPN  Continue sensitive q4h SSI and adjust as needed   Monitor TPN labs on Mon/Thurs    BrandonLorna DibbleD 09/03/2020 8:08 AM

## 2020-09-03 NOTE — Progress Notes (Signed)
Nutrition Follow-up  DOCUMENTATION CODES:   Not applicable  INTERVENTION:   -Continue Ensure Enlive po BID, each supplement provides 350 kcal and 20 grams of protein -Magic cup TID with meals, each supplement provides 290 kcal and 9 grams of protein -TPN management per pharmacy -RD will follow for diet advancement and adjust supplement regimen as appropriate  NUTRITION DIAGNOSIS:   Increased nutrient needs related to post-op healing as evidenced by estimated needs.  Ongoing  GOAL:   Patient will meet greater than or equal to 90% of their needs  Met with TPN  MONITOR:   Diet advancement,Labs,Weight trends,Skin,I & O's  REASON FOR ASSESSMENT:   Other (Comment) (TPN)    ASSESSMENT:   Henry Hill is a 60 y.o. male presenting with a 5 day history of nausea, vomiting and 3 day history of worsening abdominal pain.  4/23- s/p  Procedure:Exploratory laparotomy,Right Colectomy. 4/24- PICC placed, TPN initiated  4/26- NGT removed, advanced to clear liquid diet 4/27- advanced to full liquid, decrease TPN to hal fate today  Reviewed I/O's: +2.8 L x 24 hours and +1.3 L since admission  UOP 2.5 L x 24 hours  NGT output: 265 ml x 24 hours   Drain output: 70 ml x 24 hours  NGT removed yesterday. Pt was advanced to full liquid diet this morning. Noted minimal intake; PO 10%. Pt is consuming Ensure supplements that have been ordered by MD  Pt remains on TPN, currently receiving at 110 ml/hr, which provides 2724 kcals and 111 grams protein, meeting >100% of estimated kcal needs and 93% of estimated protein needs. Plan decrease TPN to half (55 ml/hr) at 1800, which provides 1362 kcals and 55 grams protein, meeting 56% of estimated kcal needs and 46% of estimated protein needs.   Per general surgery notes, may d/c TPN tomorrow.   Medications reviewed and include cardizem.   Labs reviewed: Na: 129, CBGS: 131-160 (inpatient orders for glycemic control are 0-9 units insulin  aspart every 4 hours).   Diet Order:   Diet Order            Diet full liquid Room service appropriate? Yes; Fluid consistency: Thin  Diet effective now                 EDUCATION NEEDS:   Education needs have been addressed  Skin:  Skin Assessment: Skin Integrity Issues: Skin Integrity Issues:: Incisions Incisions: closed abdomen  Last BM:  09/02/20  Height:   Ht Readings from Last 1 Encounters:  08/30/20 '6\' 1"'  (1.854 m)    Weight:   Wt Readings from Last 1 Encounters:  09/02/20 81.6 kg    Ideal Body Weight:  83.6 kg  BMI:  Body mass index is 23.75 kg/m.  Estimated Nutritional Needs:   Kcal:  3790-2409  Protein:  120-150 grams  Fluid:  > 2 L    Henry Hill, RD, LDN, [place] Registered Dietitian II Certified Diabetes Care and Education Specialist Please refer to [facility] for RD and/or RD on-call/weekend/after hours pager

## 2020-09-04 ENCOUNTER — Encounter: Payer: Self-pay | Admitting: Surgery

## 2020-09-04 DIAGNOSIS — I10 Essential (primary) hypertension: Secondary | ICD-10-CM

## 2020-09-04 DIAGNOSIS — K562 Volvulus: Principal | ICD-10-CM

## 2020-09-04 DIAGNOSIS — I4891 Unspecified atrial fibrillation: Secondary | ICD-10-CM

## 2020-09-04 DIAGNOSIS — K56609 Unspecified intestinal obstruction, unspecified as to partial versus complete obstruction: Secondary | ICD-10-CM

## 2020-09-04 DIAGNOSIS — N179 Acute kidney failure, unspecified: Secondary | ICD-10-CM

## 2020-09-04 DIAGNOSIS — R1084 Generalized abdominal pain: Secondary | ICD-10-CM

## 2020-09-04 LAB — GLUCOSE, CAPILLARY
Glucose-Capillary: 116 mg/dL — ABNORMAL HIGH (ref 70–99)
Glucose-Capillary: 116 mg/dL — ABNORMAL HIGH (ref 70–99)
Glucose-Capillary: 108 mg/dL — ABNORMAL HIGH (ref 70–99)
Glucose-Capillary: 131 mg/dL — ABNORMAL HIGH (ref 70–99)
Glucose-Capillary: 104 mg/dL — ABNORMAL HIGH (ref 70–99)

## 2020-09-04 MED ORDER — DILTIAZEM HCL 30 MG PO TABS
30.0000 mg | ORAL_TABLET | Freq: Once | ORAL | Status: AC
  Administered 2020-09-04: 30 mg via ORAL
  Filled 2020-09-04: qty 1

## 2020-09-04 MED ORDER — DILTIAZEM HCL 30 MG PO TABS
90.0000 mg | ORAL_TABLET | Freq: Four times a day (QID) | ORAL | Status: DC
  Administered 2020-09-04: 90 mg via ORAL
  Filled 2020-09-04: qty 3

## 2020-09-04 MED ORDER — DILTIAZEM HCL 30 MG PO TABS
60.0000 mg | ORAL_TABLET | Freq: Four times a day (QID) | ORAL | Status: DC
  Administered 2020-09-04 – 2020-09-05 (×3): 60 mg via ORAL
  Filled 2020-09-04 (×3): qty 2

## 2020-09-04 MED ORDER — DIGOXIN 0.25 MG/ML IJ SOLN
0.2500 mg | Freq: Four times a day (QID) | INTRAMUSCULAR | Status: AC
  Administered 2020-09-04 (×2): 0.25 mg via INTRAVENOUS
  Filled 2020-09-04 (×2): qty 2

## 2020-09-04 MED ORDER — METOPROLOL TARTRATE 50 MG PO TABS
50.0000 mg | ORAL_TABLET | Freq: Two times a day (BID) | ORAL | Status: DC
  Administered 2020-09-04 – 2020-09-07 (×7): 50 mg via ORAL
  Filled 2020-09-04 (×7): qty 1

## 2020-09-04 MED ORDER — APIXABAN 5 MG PO TABS
5.0000 mg | ORAL_TABLET | Freq: Two times a day (BID) | ORAL | Status: DC
  Administered 2020-09-04 – 2020-09-07 (×7): 5 mg via ORAL
  Filled 2020-09-04 (×7): qty 1

## 2020-09-04 NOTE — Progress Notes (Signed)
Progress Note    Henry Hill  IDP:[id number] DOB: 1961-02-28  DOA: 08/30/2020 PCP: Margo Common, PA-C      Brief Narrative:    Medical records reviewed and are as summarized below:  Henry Hill is a 60 y.o. male       Assessment/Plan:   Active Problems:   Essential (primary) hypertension   Cecal volvulus ([employer])   Atrial fibrillation with RVR ([place])   AKI (acute kidney injury) ([place])   Nutrition Problem: Increased nutrient needs Etiology: post-op healing  Signs/Symptoms: estimated needs   Body mass index is 23.47 kg/m.    Atrial fibrillation with RVR: Increase Cardizem from 60 mg every 8 hours to 90 mg every 6 hours and increase metoprolol from 25 mg twice daily to 50 mg twice daily. Cardiologist recommended Eliquis for stroke prophylaxis. 2D echo showed EF estimated at 50 to 44%, grade 1 diastolic dysfunction.  CHA2DS2-VASc score:1  Cecal volvulus: Status post exploratory laparotomy and right colectomy on 08/30/2020.  Surgeon plans to discontinue TPN today.  Elevated troponins: Likely due to demand ischemia  Hypertension: Continue metoprolol and Cardizem.  Patient was taking nifedipine at home.  Acute on chronic hyponatremia: Asymptomatic.  Monitor BMP.  AKI, hypokalemia, hypophosphatemia: Resolved  Diet Order            DIET SOFT Room service appropriate? Yes; Fluid consistency: Thin  Diet effective now                    Consultants:  General surgeon  Procedures:  Ex lap, right colectomy on 08/30/2020    Medications:   . apixaban  5 mg Oral BID  . Chlorhexidine Gluconate Cloth  6 each Topical Daily  . diltiazem  90 mg Oral Q6H  . feeding supplement  237 mL Oral BID BM  . insulin aspart  0-9 Units Subcutaneous Q4H  . metoprolol tartrate  50 mg Oral BID  . pantoprazole (PROTONIX) IV  40 mg Intravenous QHS  . sodium chloride flush  10-40 mL Intracatheter Q12H   Continuous Infusions: . TPN ADULT (ION) 55 mL/hr at 09/03/20  1750     Anti-infectives (From admission, onward)   Start     Dose/Rate Route Frequency Ordered Stop   08/30/20 1800  piperacillin-tazobactam (ZOSYN) IVPB 3.375 g  Status:  Discontinued        3.375 g 12.5 mL/hr over 240 Minutes Intravenous Every 8 hours 08/30/20 1321 09/03/20 1433   08/30/20 0945  piperacillin-tazobactam (ZOSYN) IVPB 3.375 g        3.375 g 100 mL/hr over 30 Minutes Intravenous  Once 08/30/20 3154 08/30/20 1108             Family Communication/Anticipated D/C date and plan/Code Status   DVT prophylaxis: SCDs Start: 08/31/20 1233 SCDs Start: 08/30/20 1304 apixaban (ELIQUIS) tablet 5 mg     Code Status: Full Code  Family Communication: Wife at the bedside Disposition Plan:    Status is: Inpatient  Remains inpatient appropriate because:Inpatient level of care appropriate due to severity of illness   Dispo: The patient is from: Home              Anticipated d/c is to: Home              Patient currently is not medically stable to d/c.   Difficult to place patient No           Subjective:   Interval events noted.  No chest pain, shortness of breath or palpitations.  Objective:    Vitals:   09/04/20 0504 09/04/20 0746 09/04/20 0901 09/04/20 1134  BP: (!) 153/86 (!) 112/92  97/77  Pulse: 83 (!) 58 (!) 103 87  Resp: 18 18  16   Temp: 97.9 F (36.6 C) 98 F (36.7 C)  98.7 F (37.1 C)  TempSrc: Oral     SpO2: 100% 100%  100%  Weight:      Height:       No data found.   Intake/Output Summary (Last 24 hours) at 09/04/2020 1502 Last data filed at 09/04/2020 1349 Gross per 24 hour  Intake 953.03 ml  Output 930 ml  Net 23.03 ml   Filed Weights   08/31/20 1000 09/02/20 0700 09/03/20 1141  Weight: 88.7 kg 81.6 kg 80.7 kg    Exam:  GEN: NAD SKIN: No rash EYES: EOMI ENT: MMM CV: Irregular rate and rhythm, tachycardic PULM: CTA B ABD: soft, ND, NT, +BS, + surgical incision with staples intact CNS: AAO x 3, non focal EXT: No  edema or tenderness       Data Reviewed:   I have personally reviewed following labs and imaging studies:  Labs: Labs show the following:   Basic Metabolic Panel: Recent Labs  Lab 08/30/20 2110 08/31/20 0135 09/01/20 0618 09/02/20 0710 09/03/20 0649  NA 127* 129* 132* 131* 129*  K 3.4* 3.4* 3.4* 3.7 3.7  CL 88* 92* 101 100 99  CO2 27 27 25 24 23   GLUCOSE 127* 127* 109* 112* 122*  BUN 52* 39* 19 10 11   CREATININE 1.15 0.93 0.60* 0.55* 0.56*  CALCIUM 7.8* 7.6* 7.6* 8.2* 8.3*  MG 1.8 1.9 2.3 2.0 2.0  PHOS  --   --  1.4* 2.6 3.9   GFR Estimated Creatinine Clearance: 111 mL/min (A) (by C-G formula based on SCr of 0.56 mg/dL (L)). Liver Function Tests: Recent Labs  Lab 08/30/20 0907 09/01/20 0618  AST 54* 19  ALT 55* 28  ALKPHOS 54 27*  BILITOT 1.5* 0.8  PROT 8.0 5.5*  ALBUMIN 4.6 2.7*   No results for input(s): LIPASE, AMYLASE in the last 168 hours. No results for input(s): AMMONIA in the last 168 hours. Coagulation profile Recent Labs  Lab 08/30/20 0907  INR 0.9    CBC: Recent Labs  Lab 08/30/20 0907 08/31/20 0135 09/01/20 0618 09/02/20 0753  WBC 11.8* 7.6 9.0 9.1  NEUTROABS 9.2*  --  5.9  --   HGB 16.1 12.4* 10.8* 11.3*  HCT 43.8 33.8* 31.7* 34.5*  MCV 91.8 91.8 99.1 110.2*  PLT 326 206 234 260   Cardiac Enzymes: No results for input(s): CKTOTAL, CKMB, CKMBINDEX, TROPONINI in the last 168 hours. BNP (last 3 results) No results for input(s): PROBNP in the last 8760 hours. CBG: Recent Labs  Lab 09/03/20 1924 09/03/20 2317 09/04/20 0347 09/04/20 0745 09/04/20 1135  GLUCAP 134* 125* 116* 116* 108*   D-Dimer: No results for input(s): DDIMER in the last 72 hours. Hgb A1c: No results for input(s): HGBA1C in the last 72 hours. Lipid Profile: No results for input(s): CHOL, HDL, LDLCALC, TRIG, CHOLHDL, LDLDIRECT in the last 72 hours. Thyroid function studies: No results for input(s): TSH, T4TOTAL, T3FREE, THYROIDAB in the last 72  hours.  Invalid input(s): FREET3 Anemia work up: No results for input(s): VITAMINB12, FOLATE, FERRITIN, TIBC, IRON, RETICCTPCT in the last 72 hours. Sepsis Labs: Recent Labs  Lab 08/30/20 [id number] 08/30/20 6270 08/30/20 1206 08/30/20 2251 08/31/20 0135  09/01/20 0618 09/02/20 0753  WBC 11.8*  --   --   --  7.6 9.0 9.1  LATICACIDVEN  --  4.3* 2.2* 1.4 1.2  --   --     Microbiology Recent Results (from the past 240 hour(s))  Resp Panel by RT-PCR (Flu A&B, Covid) Nasopharyngeal Swab     Status: None   Collection Time: 08/30/20  9:08 AM   Specimen: Nasopharyngeal Swab; Nasopharyngeal(NP) swabs in vial transport medium  Result Value Ref Range Status   SARS Coronavirus 2 by RT PCR NEGATIVE NEGATIVE Final    Comment: (NOTE) SARS-CoV-2 target nucleic acids are NOT DETECTED.  The SARS-CoV-2 RNA is generally detectable in upper respiratory specimens during the acute phase of infection. The lowest concentration of SARS-CoV-2 viral copies this assay can detect is 138 copies/mL. A negative result does not preclude SARS-Cov-2 infection and should not be used as the sole basis for treatment or other patient management decisions. A negative result may occur with  improper specimen collection/handling, submission of specimen other than nasopharyngeal swab, presence of viral mutation(s) within the areas targeted by this assay, and inadequate number of viral copies(<138 copies/mL). A negative result must be combined with clinical observations, patient history, and epidemiological information. The expected result is Negative.  Fact Sheet for Patients:  EntrepreneurPulse.com.au  Fact Sheet for Healthcare Providers:  IncredibleEmployment.be  This test is no t yet approved or cleared by the [place] FDA and  has been authorized for detection and/or diagnosis of SARS-CoV-2 by FDA under an Emergency Use Authorization (EUA). This EUA will remain  in effect  (meaning this test can be used) for the duration of the COVID-19 declaration under Section 564(b)(1) of the Act, 21 U.S.C.section 360bbb-3(b)(1), unless the authorization is terminated  or revoked sooner.       Influenza A by PCR NEGATIVE NEGATIVE Final   Influenza B by PCR NEGATIVE NEGATIVE Final    Comment: (NOTE) The Xpert Xpress SARS-CoV-2/FLU/RSV plus assay is intended as an aid in the diagnosis of influenza from Nasopharyngeal swab specimens and should not be used as a sole basis for treatment. Nasal washings and aspirates are unacceptable for Xpert Xpress SARS-CoV-2/FLU/RSV testing.  Fact Sheet for Patients: EntrepreneurPulse.com.au  Fact Sheet for Healthcare Providers: IncredibleEmployment.be  This test is not yet approved or cleared by the [place] FDA and has been authorized for detection and/or diagnosis of SARS-CoV-2 by FDA under an Emergency Use Authorization (EUA). This EUA will remain in effect (meaning this test can be used) for the duration of the COVID-19 declaration under Section 564(b)(1) of the Act, 21 U.S.C. section 360bbb-3(b)(1), unless the authorization is terminated or revoked.  Performed at [facility], [address] [place], [place] [address]   Blood culture (single)     Status: None (Preliminary result)   Collection Time: 08/30/20  9:09 AM   Specimen: BLOOD  Result Value Ref Range Status   Specimen Description BLOOD LEFT ANTECUBITAL  Final   Special Requests   Final    BOTTLES DRAWN AEROBIC AND ANAEROBIC Blood Culture results may not be optimal due to an excessive volume of blood received in culture bottles   Culture   Final    NO GROWTH 4 DAYS Performed at [facility], [address] [place], [place] [address]    Report Status PENDING  Incomplete    Procedures and diagnostic studies:  No results found.             LOS: 5  days   Ithan Touhey  [facility]   Pager on [email or web address]. If 7PM-7AM, please contact night-coverage at [email or web address]     09/04/2020, 3:02 PM

## 2020-09-04 NOTE — Consult Note (Signed)
Cardiology Consult    Patient ID: Henry Hill MRN: [id number], DOB/AGE: May 14, 1960   Admit date: 08/30/2020 Date of Consult: 09/04/2020  Primary Physician: Margo Common, PA-C Primary Cardiologist: [name], MD Requesting Provider: Kathlen Mody, MD  Patient Profile    Henry Hill is a 60 y.o. male with a history of HTN, Tob abuse, ETOH use, and varicose veins, who is being seen today for the evaluation of persistent atrial fibrillation in the setting of admission for bowel obstruction/cecal volvulus s/p ex lap and R colectomy 4/23, at the request of Dr. Hampton Abbot.  Past Medical History   Past Medical History:  Diagnosis Date  . 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 08/2020 s/p ex lap & R colectomy.  . Diastolic dysfunction    a 08/2020 Echo: EF 50-55%, no rwma, Gr1 DD, nl RV size/fxn, triv MR.  . Essential hypertension   . ETOH abuse   . Persistent atrial fibrillation ([place])    a. Dx 08/2020 in setting of cecal volvulus s/p R colectomy.  CHA2DS@VASc  = 1.  . Tobacco abuse   . Varicose veins of both lower extremities     Past Surgical History:  Procedure Laterality Date  . BACK SURGERY  2003  . HEMORRHOID SURGERY N/A 11/13/2014   Procedure: LEFT LATERAL HEMORRHOIDECTOMY  ;  Surgeon: [name], MD;  Location: [facility];  Service: General;  Laterality: N/A;  . LAPAROTOMY Right 08/30/2020   Procedure: EXPLORATORY LAPAROTOMY WITH RIGHT COLOCTOMY;  Surgeon: [name], MD;  Location: [facility] ORS;  Service: General;  Laterality: Right;     Allergies  No Known Allergies  History of Present Illness    60 y/o ? w/ the above PMH including HTN, tob abuse, etoh use, and varicose veins.  He has no prior cardiac history.  He lives locally w/ his wife.  They both work for [employer].  He says he works outside and is active throughout his work day w/o symptoms or limitations.  He does not otw exercise.  He was in his USOH until ~ 4/21, when he began to experience abd pain, n, v, anorexia.   Symptoms worsened over the course of 2 days, and he presented to [facility] ED on 4/23.  CT showed significant distention of the stomach and small bowel, with high grade bowel obstruction and cecal volvulus.  Incidentally, Ao atherosclerosis was also seen.  Cr was elevated @ 1.8.  Lactate 4.3. Na 125.  WBC 11.8.  Admission ECG showed sinus tachycardia @ 134 w/ prior ant infarct.  HsTrop was mildly elevated w/ flat trend @ 33  36  20.  He was seen by surgery and underwent ex lap w/ R colectomy on 4/23.  Post-op, he developed Afib w/ RVR @ 141 w/ inflat ST depression.  He was initially placed on IV dilt and over the course of the last 5 days, this has been transitioned to oral dilt and metoprolol.  Echo on 4/24 showed low-nl EF @ 50-55% w/o rwma, and grade 1 DD.  No significant valvular dzs was noted.  He is now tolerating POs and has had bowel movements.  TPN is due to end today.  HRs have continued to trend in the low 100's to 130's w/ activity.  This AM, metoprolol was increased to 50 bid, and dilt increased to 90 bid.  He has not been on [place] up to this point.  He denies chest pain, dyspnea, or palpitations.  He notes mild abdominal soreness.  He is very eager to go home.  Inpatient Medications    . apixaban  5 mg Oral BID  . Chlorhexidine Gluconate Cloth  6 each Topical Daily  . diltiazem  30 mg Oral Once  . diltiazem  90 mg Oral Q6H  . feeding supplement  237 mL Oral BID BM  . insulin aspart  0-9 Units Subcutaneous Q4H  . metoprolol tartrate  50 mg Oral BID  . pantoprazole (PROTONIX) IV  40 mg Intravenous QHS  . sodium chloride flush  10-40 mL Intracatheter Q12H    Family History    Family History  Problem Relation Age of Onset  . Heart attack Mother   . Diabetes Father   . Hypertension Father    He indicated that his mother is deceased. He indicated that his father is alive. He indicated that his brother is alive. He indicated that his daughter is alive.   Social History    Social  History   Socioeconomic History  . Marital status: Married    Spouse name: Not on file  . Number of children: Not on file  . Years of education: Not on file  . Highest education level: Not on file  Occupational History  . Not on file  Tobacco Use  . Smoking status: Current Every Day Smoker    Packs/day: 0.75    Types: Cigarettes  . Smokeless tobacco: Never Used  Substance and Sexual Activity  . Alcohol use: Yes    Alcohol/week: 20.0 standard drinks    Types: 20 Cans of beer per week    Comment: MODERATE USE  . Drug use: No  . Sexual activity: Not on file  Other Topics Concern  . Not on file  Social History Narrative  . Not on file   Social Determinants of Health   Financial Resource Strain: Not on file  Food Insecurity: Not on file  Transportation Needs: Not on file  Physical Activity: Not on file  Stress: Not on file  Social Connections: Not on file  Intimate Partner Violence: Not on file     Review of Systems    General:  No chills, fever, night sweats or weight changes.  Cardiovascular:  No chest pain, dyspnea on exertion, edema, orthopnea, palpitations, paroxysmal nocturnal dyspnea. Dermatological: No rash, lesions/masses Respiratory: No cough, dyspnea Urologic: No hematuria, dysuria Abdominal:   Prior to admission -+++ abd pain, +++ nausea, +++ vomiting, +++ anorexia.  No diarrhea, bright red blood per rectum, melena, or hematemesis Neurologic:  No visual changes, wkns, changes in mental status. All other systems reviewed and are otherwise negative except as noted above.  Physical Exam    Blood pressure (!) 112/92, pulse (!) 103, temperature 98 F (36.7 C), resp. rate 18, height 6\' 1"  (1.854 m), weight 80.7 kg, SpO2 100 %.  General: Pleasant, NAD Psych: Normal affect. Neuro: Alert and oriented X 3. Moves all extremities spontaneously. HEENT: Normal  Neck: Supple without bruits or JVD. Lungs:  Resp regular and unlabored, CTA. Heart: RRR no s3, s4, or  murmurs. Abdomen: Soft, non-distended, incisional tenderness w/ drain in place. BS + x 4.  Extremities: No clubbing, cyanosis or edema. DP/PT2+, Radials 2+ and equal bilaterally.  Labs    Cardiac Enzymes Recent Labs  Lab 08/30/20 0907 08/30/20 1206 08/30/20 2110  TROPONINIHS 33* 36* 20*      Lab Results  Component Value Date   WBC 9.1 09/02/2020   HGB 11.3 (L) 09/02/2020   HCT 34.5 (L) 09/02/2020  MCV 110.2 (H) 09/02/2020   PLT 260 09/02/2020    Recent Labs  Lab 09/01/20 0618 09/02/20 0710 09/03/20 0649  NA 132*   < > 129*  K 3.4*   < > 3.7  CL 101   < > 99  CO2 25   < > 23  BUN 19   < > 11  CREATININE 0.60*   < > 0.56*  CALCIUM 7.6*   < > 8.3*  PROT 5.5*  --   --   BILITOT 0.8  --   --   ALKPHOS 27*  --   --   ALT 28  --   --   AST 19  --   --   GLUCOSE 109*   < > 122*   < > = values in this interval not displayed.   Lab Results  Component Value Date   CHOL 151 07/03/2019   HDL 100 07/03/2019   LDLCALC 42 07/03/2019   TRIG 48 09/01/2020    Radiology Studies    DG Abdomen 1 View  Result Date: 08/30/2020 CLINICAL DATA:  NG tube insertion EXAM: ABDOMEN - 1 VIEW COMPARISON:  Same day CT FINDINGS: Limited radiograph of the lower chest and upper abdomen was obtained for the purposes of enteric tube localization. Enteric tube is seen coursing below the diaphragm with distal tip and side port terminating within the expected location of the gastric body. Dilated loops of large and small bowel throughout the abdomen compatible with known high-grade bowel obstruction. IMPRESSION: 1. Enteric tube tip and side port project within the expected location of the gastric body. 2. Bowel obstruction. Electronically Signed   By: Davina Poke D.O.   On: 08/30/2020 11:55   CT ABDOMEN PELVIS W CONTRAST  Result Date: 08/30/2020 CLINICAL DATA:  Diffuse abdominal distension. EXAM: CT ABDOMEN AND PELVIS WITH CONTRAST TECHNIQUE: Multidetector CT imaging of the abdomen and pelvis  was performed using the standard protocol following bolus administration of intravenous contrast. CONTRAST:  66mL OMNIPAQUE IOHEXOL 300 MG/ML  SOLN COMPARISON:  None. FINDINGS: Lower chest: Small bilateral pleural effusions, left greater than right. Hepatobiliary: No focal liver abnormality is seen. No gallstones, gallbladder wall thickening, or biliary dilatation. Pancreas: Unremarkable. No pancreatic ductal dilatation or surrounding inflammatory changes. Spleen: Normal in size without focal abnormality. Adrenals/Urinary Tract: Normal adrenal glands. 4 mm cortical hypodensity within the posterior cortex of the left mid kidney is too small to characterize. No hydronephrosis or mass identified bilaterally. Urinary bladder is unremarkable. Stomach/Bowel: Diffuse distension of the stomach and small bowel loops identified up to the level of the cecum which is also distended. Beyond the cecum there is collapse of the ascending colon. Here, at the transition point there is a swirling pattern of collapsed bowel loop which is concerning for cecal volvulus. Vascular/Lymphatic: Aortic atherosclerosis. No aneurysm. No abdominopelvic adenopathy. Large varicosities are identified within both inguinal regions. Reproductive: Mild prostate gland enlargement. Other: No signs of pneumoperitoneum. No significant free fluid or fluid collections. Musculoskeletal: Degenerative disc disease is noted within the lumbar spine. No acute or suspicious findings. IMPRESSION: 1. Examination is positive for high-grade bowel obstruction. Transition point is just beyond the level of the cecum where a cecal volvulus is suspected. No signs of bowel perforation. Pneumoperitoneum identified 2. Small bilateral pleural effusions, left greater than right. 3. Aortic atherosclerosis. Aortic Atherosclerosis (ICD10-I70.0). Electronically Signed   By: Kerby Moors M.D.   On: 08/30/2020 11:10   DG Chest Port 1 View  Result Date: 08/31/2020  CLINICAL DATA:   Peripherally inserted central catheter EXAM: PORTABLE CHEST 1 VIEW COMPARISON:  08/30/2020 FINDINGS: Right-sided PICC line tip is in the mid SVC. Cardiomediastinal contours are normal. Esophageal catheter side port projects within the stomach. Normal pleural spaces. IMPRESSION: PICC line tip in the mid SVC. Electronically Signed   By: Ulyses Jarred M.D.   On: 08/31/2020 19:18   DG Chest Portable 1 View  Addendum Date: 08/30/2020   ADDENDUM REPORT: 08/30/2020 09:55 ADDENDUM: Critical Value/emergent results were called by telephone at the time of interpretation on 08/30/2020 at 9:55 am to provider [facility] , who verbally acknowledged these results. Electronically Signed   By: Kerby Moors M.D.   On: 08/30/2020 09:55   Result Date: 08/30/2020 CLINICAL DATA:  Shortness of breath.  Evaluate for infiltrates. EXAM: PORTABLE CHEST 1 VIEW COMPARISON:  12/04/2015. FINDINGS: Normal heart size and mediastinal contours. Diminished lung volumes with marked asymmetric elevation of the right hemidiaphragm. Lucency along the undersurface of both right and left hemidiaphragm noted. No pleural effusion or edema identified. No airspace densities. IMPRESSION: 1. No focal pulmonary opacities. 2. Marked asymmetric elevation of the right hemidiaphragm. Lucency along the undersurface of both right and left hemidiaphragm. Cannot rule out pneumoperitoneum. In a patient presenting with progressive abdominal pain and distension advise further investigation with CT of the abdomen and pelvis. Electronically Signed: By: Kerby Moors M.D. On: 08/30/2020 09:49    ECG & Cardiac Imaging    4/23 0858: ST, 134, prior ant infarct - personally reviewed. 4/23 1759: Afib RVR 141, LVH, inferolateral ST depression 4/24 0611: Afib, 81, LVH, anterolateral ST dep w/ TWI  Assessment & Plan    1.  Bowel obstruction/Cecal Volvulus:  S/p ex lap and R colectomy on 4/23.  Per surgery.  TPN to be d/c'd today.  Tolerating POs.  Has had BMs.  Mild  incisional tenderness @ this point.  2.  Afib w/ RVR:  Developed post-op and persistent since.  Rates variable - 100's to 130's yesterday.  He is asymptomatic.  CHA2DS2VASc = 1.  Discussed w/ surgical team.  Will add eliquis 5mg  BID today.  Agree w/ further titration of dilt to 90 q6 and metoprolol to 50 bid.  Goal HR < 100.  Provided that this can be achieved, we can hopefully look to consolidate to long-acting dilt for tomorrow and he can likely be d/c'd w/ rate control w/ plan for 3-4 wks or oral anticoagulation followed by outpt DCCV if necessary.  We did discuss potential roll of TEE and DCCV prior to discharge, however, given recent surgery and lack of Ss related to Afib @ this time, we'd prefer that he recover from surgery some more, prior to DCCV.  Will need outpt sleep study.  3.  Elevated HsTrop/Demand Ischemia:  Mild HsTrop elevation w/ flat trend in the setting of #1 - 33  36  20.  ECGs w/ variable degrees of ST depression in the setting of LVH and tachycardia. He denies any h/o chest pain or dyspnea.  This does not represent ACS.  Suspect demand ischemia.  Reassured by echo - EF 50-55% w/o rwma.  CT abd did show Ao atherosclerosis, though lipid panel from last year impressive w/ HDL of 100 and LDL of 42 (statin naive). Will plan on outpt myoview for risk stratification.  4. Essential HTN:  BPs variable.  Follow on dilt and  blocker.  Was on nifedipine @ home previously.  5.  AKI:  In setting of #1. Resolved.  6.  Tob/Alcohol abuse:  Cessation advised. ETOH will contribute to future bouts of Afib.  Signed, Murray Hodgkins, NP 09/04/2020, 9:22 AM  For questions or updates, please contact   Please consult [email or web address] for contact info under Cardiology/STEMI.

## 2020-09-04 NOTE — Progress Notes (Signed)
[place] Hospital Day(s): 5.   Post op day(s): 5 Days Post-Op.   Interval History:  Patient seen and examined No acute events or new complaints overnight.  Patient reports he continues to feel better; anxious to get home Incisional soreness reported; no fever, chills, nausea, meiss Labs are pending this morning; need collected Transitioned to PO Cardizem at 60 mg q8 and 25 mg Metoprolol BID; HR remains 70 - 114; still in atrial fibrillation Surgical drain with 60 ccs out overnight; serous He continues to have bowel function; multiple BMs recorded He was able to ambulate yesterday  Vital signs in last 24 hours: [min-max] current  Temp:  [97.4 F (36.3 C)-97.9 F (36.6 C)] 97.9 F (36.6 C) (04/28 0504) Pulse Rate:  [70-114] 83 (04/28 0504) Resp:  [18-22] 18 (04/28 0504) BP: (108-153)/(86-94) 153/86 (04/28 0504) SpO2:  [98 %-100 %] 100 % (04/28 0504) Weight:  [80.7 kg] 80.7 kg (04/27 1141)     Height: 6\' 1"  (185.4 cm) Weight: 80.7 kg BMI (Calculated): 23.48   Intake/Output last 2 shifts:  04/27 0701 - 04/28 0700 In: 1793 [P.O.:360; I.V.:1333; IV Piggyback:100] Out: 1035 [Urine:975; Drains:60]   Physical Exam:  Constitutional: alert, cooperative and no distress Respiratory: breathing non-labored at rest Cardiovascular: regular rate, irregularly irregular Gastrointestinal:Soft, incisional soreness, non-distended, no rebound/guarding, surgical drain in place with serous output Integumentary:Laparotomy incision is CDI with staples, no erythema, no drainage  Labs:  CBC Latest Ref Rng & Units 09/02/2020 09/01/2020 08/31/2020  WBC 4.0 - 10.5 K/uL 9.1 9.0 7.6  Hemoglobin 13.0 - 17.0 g/dL 11.3(L) 10.8(L) 12.4(L)  Hematocrit 39.0 - 52.0 % 34.5(L) 31.7(L) 33.8(L)  Platelets 150 - 400 K/uL 260 234 206   CMP Latest Ref Rng & Units 09/03/2020 09/02/2020 09/01/2020  Glucose 70 - 99 mg/dL 122(H) 112(H) 109(H)  BUN 6 - 20 mg/dL 11 10 19    Creatinine 0.61 - 1.24 mg/dL 0.56(L) 0.55(L) 0.60(L)  Sodium 135 - 145 mmol/L 129(L) 131(L) 132(L)  Potassium 3.5 - 5.1 mmol/L 3.7 3.7 3.4(L)  Chloride 98 - 111 mmol/L 99 100 101  CO2 22 - 32 mmol/L 23 24 25   Calcium 8.9 - 10.3 mg/dL 8.3(L) 8.2(L) 7.6(L)  Total Protein 6.5 - 8.1 g/dL - - 5.5(L)  Total Bilirubin 0.3 - 1.2 mg/dL - - 0.8  Alkaline Phos 38 - 126 U/L - - 27(L)  AST 15 - 41 U/L - - 19  ALT 0 - 44 U/L - - 28     Imaging studies: No new pertinent imaging studies   Assessment/Plan:  60 y.o. male with overall improvement from surgical perspective but has still failed to convert to NSR 5 Days Post-Op s/p exploratory laparotomy and right colectomyforcecal volvulus, complicated by pertinent comorbidities includingnew onset atrial fibrillation currently rate controlled   - Okay for soft diet             - We can discontinue TPN today after completion of current bag             - Monitor abdominal examination; on-going bowel function - Pain control prn; antiemetics prn - Mobilization as tolerated - Follow up morning labs - Continue PO CCB + BB for atrial fibrillation. He has failed to convert to NSR, I will involve cardiology to ensure no further inpatient needs and establish outpatient follow up - Appreciate medicine assistance   - Discharge Planning: At this point, he is pending cardiology evaluation for atrial fibrillation management/conversion which seems to be  his remaining barrier to discharge. Hopefully home in next 24-48 hours  All of the above findings and recommendations were discussed with the patient, and the medical team, and all of patient's questions were answered to his expressed satisfaction.  -- Edison Simon, PA-C [facility] 09/04/2020, 6:56 AM [phone] M-F: 7am - 4pm

## 2020-09-04 NOTE — Progress Notes (Signed)
Mobility Specialist - Progress Note   09/04/20 1500  Mobility  Activity Ambulated in hall  Level of Assistance Modified independent, requires aide device or extra time  Assistive Device Front wheel walker  Distance Ambulated (ft) 180 ft  Mobility Response Tolerated well  Mobility performed by Mobility specialist  $Mobility charge 1 Mobility    Pre-mobility: 89 HR, 98% SpO2 During mobility: 113 HR Post-mobility: 90 HR, 94% SpO2   Pt ambulated in hallway with RW. No LOB. ModI. Pt does c/o mild lightheadedness during ambulation, 1 short standing break taken. Pt denied SOB and weakness. HR ranging between 90-120 bpm with irregular pattern. Pt left in bed with family at bedside. Pt voiced agitation prior to exit d/t lengthy hospital stay. Mobility offered active listening and encouragement.    Kathee Delton Mobility Specialist 09/04/20, 3:44 PM

## 2020-09-04 NOTE — Consult Note (Signed)
PHARMACY CONSULT NOTE - FOLLOW UP  Pharmacy Consult for Electrolyte Monitoring and Replacement   Recent Labs: Potassium (mmol/L)  Date Value  09/03/2020 3.7   Magnesium (mg/dL)  Date Value  09/03/2020 2.0   Calcium (mg/dL)  Date Value  09/03/2020 8.3 (L)   Albumin (g/dL)  Date Value  09/01/2020 2.7 (L)  07/03/2019 4.5   Phosphorus (mg/dL)  Date Value  09/03/2020 3.9   Sodium (mmol/L)  Date Value  09/03/2020 129 (L)  07/03/2019 130 (L)     Assessment: 60 y.o.malepresenting with a 5 day history of nausea, vomiting and 3 day history of worsening abdominal pain. He has been unable to keep solid po intake, but was able to drink some fluids. However, given the worsening pain, he presented today to the ER for evaluation. In the ER, he has remained tachycardic, with abdominal distention. Pt underwent (4/23) ex laparotomy & rt colectomy post-op c/b ileus and Afib. Pt initially managed with TPN (4/23-28) until ileus resolved and pt is advancing diet to soft foods. Afib req'd diltiazem gtt (4/23-27) to maintain rate now transitioned to PO dilt. Pharmacy has been consulted for electrolyte management.   Electrolytes: *NNL - No New Labs Na 129>NNL(last low/stable) K 3.7>NNL (last WNL) Phos: 2.6>3.9>NNL (WNL) Corrected calcium 8.8>8.9>NNL, (WNL) - all others WNL;   Goal of Therapy:  [place] WNL  Plan:  Completing TPN today, last labs WNL and NNL today. Will check to with AM labs tomorrow if any repletion needs  Lorna Dibble ,PharmD Clinical Pharmacist 09/04/2020 8:33 AM

## 2020-09-04 NOTE — Progress Notes (Signed)
PHARMACY - TOTAL PARENTERAL NUTRITION CONSULT NOTE   Indication: Prolonged ileus  Patient Measurements: Height: 6\' 1"  (185.4 cm) Weight: 80.7 kg (177 lb 14.4 oz) IBW/kg (Calculated) : 79.9 TPN AdjBW (KG): 89.8 Body mass index is 23.47 kg/m.  Assessment:  60 y.o. male presenting with a 5 day history of nausea, vomiting and 3 day history of worsening abdominal pain.  He has been unable to keep solid po intake, but was able to drink some fluids.  However, given the worsening pain, he presented today to the ER for evaluation.  In the ER, he has remained tachycardic, with abdominal distention. Pharmacy has been consulted for TPN management.   Glucose / Insulin: +SSI (5 u/24h) 24h BG 110-160s (a1c 5.4%) Electrolytes:  Na 129>pending (low/stable) K 3.7>pending (WNL) Phos: 2.6>3.9>pending(WNL) Corrected calcium 8.8>8.9>pending, (WNL) - all others WNL; Triglycerides also returned WNL (48mg /dL on 4/25) Renal: Scr 0.55>0.56>pending (stable) Hepatic: AST 54>19, ALT 55>28 (stable) Intake / Output; MIVF: NONE currently Net I/O = (+) 1362mL GI Imaging: 4/23 CT abdomen: high grade bowel obstruction. 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  4/23 Xray abdomen: bowel obstruction GI Surgeries / Procedures:  4/23 exploratory laparotomy with right coloctomy   Central access: Placement on 4/24  TPN start date: 4/24>>  Nutritional Goals (per [place] TPN Handbook): kCal: 2700 kcal/day, Protein: 110 g/day, Fluid: 2.7 L/day Goal TPN rate is 110 mL/hr (provides 110 g of protein and 2723 kcals per day)  Current Nutrition:  NPO  Plan:  -- complete current TPN bag then will d/c TPN; pt advancing to soft diet and has return of bowel fxn.  Continue TPN rate at 55 mL/hr until end of current order then d/c.  Electrolytes in TPN: Na 81mEq/L, K 61mEq/L, Ca 31mEq/L, Mg 67mEq/L, and Phos 54mmol/L. Cl:Ac 1:1  Will complete TPN consult today, but continue to follow electrolyte mgmt per active consult.  Add standard  MVI and trace elements to TPN  Continue sensitive q4h SSI and adjust as needed   Routine TPN labs on Mon/Thurs discontinued; replaced with daily electrolyte check.   Lorna Dibble, PharmD 09/04/2020 8:24 AM

## 2020-09-05 ENCOUNTER — Encounter: Payer: Self-pay | Admitting: Surgery

## 2020-09-05 ENCOUNTER — Inpatient Hospital Stay: Payer: 59

## 2020-09-05 LAB — BASIC METABOLIC PANEL
Anion gap: 8 (ref 5–15)
BUN: 12 mg/dL (ref 6–20)
CO2: 21 mmol/L — ABNORMAL LOW (ref 22–32)
Calcium: 8.5 mg/dL — ABNORMAL LOW (ref 8.9–10.3)
Chloride: 103 mmol/L (ref 98–111)
Creatinine, Ser: 0.53 mg/dL — ABNORMAL LOW (ref 0.61–1.24)
GFR, Estimated: >60 mL/min (ref >60)
Glucose, Bld: 93 mg/dL (ref 70–99)
Potassium: 4.3 mmol/L (ref 3.5–5.1)
Sodium: 132 mmol/L — ABNORMAL LOW (ref 135–145)

## 2020-09-05 LAB — CBC
HCT: 37 % — ABNORMAL LOW (ref 39.0–52.0)
Hemoglobin: 12.7 g/dL — ABNORMAL LOW (ref 13.0–17.0)
MCH: 33.2 pg (ref 26.0–34.0)
MCHC: 34.3 g/dL (ref 30.0–36.0)
MCV: 96.6 fL (ref 80.0–100.0)
Platelets: 383 10*3/uL (ref 150–400)
RBC: 3.83 MIL/uL — ABNORMAL LOW (ref 4.22–5.81)
RDW: 12.7 % (ref 11.5–15.5)
WBC: 14.9 10*3/uL — ABNORMAL HIGH (ref 4.0–10.5)
nRBC: 0 % (ref 0.0–0.2)

## 2020-09-05 LAB — PHOSPHORUS: Phosphorus: 3.6 mg/dL (ref 2.5–4.6)

## 2020-09-05 LAB — GLUCOSE, CAPILLARY
Glucose-Capillary: 105 mg/dL — ABNORMAL HIGH (ref 70–99)
Glucose-Capillary: 120 mg/dL — ABNORMAL HIGH (ref 70–99)

## 2020-09-05 LAB — MAGNESIUM: Magnesium: 2 mg/dL (ref 1.7–2.4)

## 2020-09-05 MED ORDER — IOHEXOL 300 MG/ML  SOLN
100.0000 mL | Freq: Once | INTRAMUSCULAR | Status: AC | PRN
  Administered 2020-09-05: 100 mL via INTRAVENOUS

## 2020-09-05 MED ORDER — DILTIAZEM HCL ER COATED BEADS 120 MG PO CP24
240.0000 mg | ORAL_CAPSULE | Freq: Every day | ORAL | Status: DC
  Administered 2020-09-05: 240 mg via ORAL
  Filled 2020-09-05: qty 2

## 2020-09-05 MED ORDER — DIGOXIN 250 MCG PO TABS
0.2500 mg | ORAL_TABLET | Freq: Every day | ORAL | Status: DC
  Administered 2020-09-05: 0.25 mg via ORAL
  Filled 2020-09-05: qty 1

## 2020-09-05 MED ORDER — DILTIAZEM HCL ER COATED BEADS 180 MG PO CP24
180.0000 mg | ORAL_CAPSULE | Freq: Every day | ORAL | Status: DC
  Administered 2020-09-06 – 2020-09-07 (×2): 180 mg via ORAL
  Filled 2020-09-05 (×2): qty 1

## 2020-09-05 MED ORDER — IOHEXOL 9 MG/ML PO SOLN
500.0000 mL | ORAL | Status: AC
  Administered 2020-09-05 (×2): 500 mL via ORAL

## 2020-09-05 MED ORDER — OXYCODONE HCL 5 MG PO TABS: 5.0000 mg | ORAL_TABLET | ORAL | Status: DC | PRN

## 2020-09-05 NOTE — Progress Notes (Signed)
Progress Note    FAVOR HACKLER  KYH:[id number] DOB: 1960/07/01  DOA: 08/30/2020 PCP: Margo Common, PA-C      Brief Narrative:    Medical records reviewed and are as summarized below:  Henry Hill is a 60 y.o. 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    Atrial fibrillation with RVR: Converted to normal sinus rhythm.  Continue metoprolol, Cardizem and Eliquis. 2D echo showed EF estimated at 50 to 15%, grade 1 diastolic dysfunction.  CHA2DS2-VASc score:1  Cecal volvulus: Status post exploratory laparotomy and right colectomy on 08/30/2020.  Follow-up with general surgeon.  Elevated troponins: Likely due to demand ischemia  Hypertension: Continue metoprolol and Cardizem.  Patient was taking nifedipine at home.  Acute on chronic hyponatremia: Asymptomatic.  Monitor BMP.  AKI, hypokalemia, hypophosphatemia: Resolved  Diet Order            DIET SOFT Room service appropriate? Yes; Fluid consistency: Thin  Diet effective now                    Consultants:  General surgeon  Procedures:  Ex lap, right colectomy on 08/30/2020    Medications:   . apixaban  5 mg Oral BID  . Chlorhexidine Gluconate Cloth  6 each Topical Daily  . diltiazem  240 mg Oral Daily  . feeding supplement  237 mL Oral BID BM  . insulin aspart  0-9 Units Subcutaneous Q4H  . metoprolol tartrate  50 mg Oral BID  . pantoprazole (PROTONIX) IV  40 mg Intravenous QHS  . sodium chloride flush  10-40 mL Intracatheter Q12H   Continuous Infusions:    Anti-infectives (From admission, onward)   Start     Dose/Rate Route Frequency Ordered Stop   08/30/20 1800  piperacillin-tazobactam (ZOSYN) IVPB 3.375  g  Status:  Discontinued        3.375 g 12.5 mL/hr over 240 Minutes Intravenous Every 8 hours 08/30/20 1321 09/03/20 1433   08/30/20 0945  piperacillin-tazobactam (ZOSYN) IVPB 3.375 g        3.375 g 100 mL/hr over 30 Minutes Intravenous  Once 08/30/20 1761 08/30/20 1108             Family Communication/Anticipated D/C date and plan/Code Status   DVT prophylaxis: SCDs Start: 08/31/20 1233 SCDs Start: 08/30/20 1304 apixaban (ELIQUIS) tablet 5 mg     Code Status: Full Code  Family Communication: Wife at the bedside Disposition Plan:    Status is: Inpatient  Remains inpatient appropriate because:Inpatient level of care appropriate due to severity of illness   Dispo: The patient is from: Home              Anticipated d/c is to: Home              Patient currently is not medically stable to d/c.   Difficult to place patient No           Subjective:   No abdominal pain, shortness of breath, chest pain or palpitations.  Objective:    Vitals:   09/05/20 0520 09/05/20 0815 09/05/20 1343 09/05/20 1446  BP: (!) 141/79 124/79 99/74 108/82  Pulse: 69 84 91  Resp: 20 18 16    Temp: 97.7 F (36.5 C) (!) 97.5 F (36.4 C) 98.3 F (36.8 C)   TempSrc: Oral Oral    SpO2: 98% 98% 97%   Weight:      Height:       No data found.   Intake/Output Summary (Last 24 hours) at 09/05/2020 1515 Last data filed at 09/05/2020 0945 Gross per 24 hour  Intake 480 ml  Output 945 ml  Net -465 ml   Filed Weights   09/02/20 0700 09/03/20 1141 09/04/20 1547  Weight: 81.6 kg 80.7 kg 80.6 kg    Exam:  GEN: NAD SKIN: No rash EYES: EOMI ENT: MMM CV: Regular rate and rhythm. PULM: CTA B ABD: soft, ND, NT, +BS, + surgical incision with staples intact.  JP drain with serosanguineous fluid. CNS: AAO x 3, non focal EXT: No edema or tenderness        Data Reviewed:   I have personally reviewed following labs and imaging studies:  Labs: Labs show the following:    Basic Metabolic Panel: Recent Labs  Lab 08/31/20 0135 09/01/20 0618 09/02/20 0710 09/03/20 0649 09/05/20 0523  NA 129* 132* 131* 129* 132*  K 3.4* 3.4* 3.7 3.7 4.3  CL 92* 101 100 99 103  CO2 27 25 24 23  21*  GLUCOSE 127* 109* 112* 122* 93  BUN 39* 19 10 11 12   CREATININE 0.93 0.60* 0.55* 0.56* 0.53*  CALCIUM 7.6* 7.6* 8.2* 8.3* 8.5*  MG 1.9 2.3 2.0 2.0 2.0  PHOS  --  1.4* 2.6 3.9 3.6   GFR Estimated Creatinine Clearance: 111 mL/min (A) (by C-G formula based on SCr of 0.53 mg/dL (L)). Liver Function Tests: Recent Labs  Lab 08/30/20 0907 09/01/20 0618  AST 54* 19  ALT 55* 28  ALKPHOS 54 27*  BILITOT 1.5* 0.8  PROT 8.0 5.5*  ALBUMIN 4.6 2.7*   No results for input(s): LIPASE, AMYLASE in the last 168 hours. No results for input(s): AMMONIA in the last 168 hours. Coagulation profile Recent Labs  Lab 08/30/20 0907  INR 0.9    CBC: Recent Labs  Lab 08/30/20 0907 08/31/20 0135 09/01/20 0618 09/02/20 0753 09/05/20 0523  WBC 11.8* 7.6 9.0 9.1 14.9*  NEUTROABS 9.2*  --  5.9  --   --   HGB 16.1 12.4* 10.8* 11.3* 12.7*  HCT 43.8 33.8* 31.7* 34.5* 37.0*  MCV 91.8 91.8 99.1 110.2* 96.6  PLT 326 206 234 260 383   Cardiac Enzymes: No results for input(s): CKTOTAL, CKMB, CKMBINDEX, TROPONINI in the last 168 hours. BNP (last 3 results) No results for input(s): PROBNP in the last 8760 hours. CBG: Recent Labs  Lab 09/04/20 0745 09/04/20 1135 09/04/20 1547 09/04/20 2025 09/05/20 0932  GLUCAP 116* 108* 131* 104* 105*   D-Dimer: No results for input(s): DDIMER in the last 72 hours. Hgb A1c: No results for input(s): HGBA1C in the last 72 hours. Lipid Profile: No results for input(s): CHOL, HDL, LDLCALC, TRIG, CHOLHDL, LDLDIRECT in the last 72 hours. Thyroid function studies: No results for input(s): TSH, T4TOTAL, T3FREE, THYROIDAB in the last 72 hours.  Invalid input(s): FREET3 Anemia work up: No results for input(s): VITAMINB12, FOLATE, FERRITIN, TIBC,  IRON, RETICCTPCT in the last 72 hours. Sepsis Labs: Recent Labs  Lab 08/30/20 0909 08/30/20 1206 08/30/20 2251 08/31/20 0135 09/01/20 0618 09/02/20 0753 09/05/20 0523  WBC  --   --   --  7.6 9.0 9.1 14.9*  LATICACIDVEN 4.3* 2.2* 1.4 1.2  --   --   --  Microbiology Recent Results (from the past 240 hour(s))  Resp Panel by RT-PCR (Flu A&B, Covid) Nasopharyngeal Swab     Status: None   Collection Time: 08/30/20  9:08 AM   Specimen: Nasopharyngeal Swab; Nasopharyngeal(NP) swabs in vial transport medium  Result Value Ref Range Status   SARS Coronavirus 2 by RT PCR NEGATIVE NEGATIVE Final    Comment: (NOTE) SARS-CoV-2 target nucleic acids are NOT DETECTED.  The SARS-CoV-2 RNA is generally detectable in upper respiratory specimens during the acute phase of infection. The lowest concentration of SARS-CoV-2 viral copies this assay can detect is 138 copies/mL. A negative result does not preclude SARS-Cov-2 infection and should not be used as the sole basis for treatment or other patient management decisions. A negative result may occur with  improper specimen collection/handling, submission of specimen other than nasopharyngeal swab, presence of viral mutation(s) within the areas targeted by this assay, and inadequate number of viral copies(<138 copies/mL). A negative result must be combined with clinical observations, patient history, and epidemiological information. The expected result is Negative.  Fact Sheet for Patients:  EntrepreneurPulse.com.au  Fact Sheet for Healthcare Providers:  IncredibleEmployment.be  This test is no t yet approved or cleared by the [place] FDA and  has been authorized for detection and/or diagnosis of SARS-CoV-2 by FDA under an Emergency Use Authorization (EUA). This EUA will remain  in effect (meaning this test can be used) for the duration of the COVID-19 declaration under Section 564(b)(1) of the Act,  21 U.S.C.section 360bbb-3(b)(1), unless the authorization is terminated  or revoked sooner.       Influenza A by PCR NEGATIVE NEGATIVE Final   Influenza B by PCR NEGATIVE NEGATIVE Final    Comment: (NOTE) The Xpert Xpress SARS-CoV-2/FLU/RSV plus assay is intended as an aid in the diagnosis of influenza from Nasopharyngeal swab specimens and should not be used as a sole basis for treatment. Nasal washings and aspirates are unacceptable for Xpert Xpress SARS-CoV-2/FLU/RSV testing.  Fact Sheet for Patients: EntrepreneurPulse.com.au  Fact Sheet for Healthcare Providers: IncredibleEmployment.be  This test is not yet approved or cleared by the [place] FDA and has been authorized for detection and/or diagnosis of SARS-CoV-2 by FDA under an Emergency Use Authorization (EUA). This EUA will remain in effect (meaning this test can be used) for the duration of the COVID-19 declaration under Section 564(b)(1) of the Act, 21 U.S.C. section 360bbb-3(b)(1), unless the authorization is terminated or revoked.  Performed at [facility], [address] [place], [place] [address]   Blood culture (single)     Status: None (Preliminary result)   Collection Time: 08/30/20  9:09 AM   Specimen: BLOOD  Result Value Ref Range Status   Specimen Description BLOOD LEFT ANTECUBITAL  Final   Special Requests   Final    BOTTLES DRAWN AEROBIC AND ANAEROBIC Blood Culture results may not be optimal due to an excessive volume of blood received in culture bottles   Culture   Final    NO GROWTH 4 DAYS Performed at [facility], [address] [place], [place] [address]    Report Status PENDING  Incomplete    Procedures and diagnostic studies:  CT ABDOMEN PELVIS W CONTRAST  Result Date: 09/05/2020 CLINICAL DATA:  Postop day 6 from right colectomy with leukocytosis. Rule out infection. 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 EXAM: CT ABDOMEN AND  PELVIS WITH CONTRAST TECHNIQUE: Multidetector CT imaging of the abdomen and pelvis was performed using the standard protocol following bolus administration  of intravenous contrast. CONTRAST:  123mL OMNIPAQUE IOHEXOL 300 MG/ML  SOLN COMPARISON:  08/30/2020 FINDINGS: Lower chest: Clear lung bases. Normal heart size without pericardial or pleural effusion. Right coronary artery calcification. Hepatobiliary: Normal liver. Normal gallbladder, without biliary ductal dilatation. Pancreas: Normal, without mass or ductal dilatation. Spleen: Normal in size, without focal abnormality. Adrenals/Urinary Tract: Normal adrenal glands. Left renal too small to characterize lesions. Normal right kidney, without hydronephrosis. The bladder is decompressed. Stomach/Bowel: Normal stomach, without wall thickening. Status post right hemicolectomy. No fluid collection at the operative site. Mid small bowel loops measure up to the 3.2 cm, without focal transition identified. Vascular/Lymphatic: Aortic atherosclerosis. 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 No abdominopelvic adenopathy. Reproductive: Mild prostatomegaly. Other: Surgical drain terminates in the left hemipelvis on 66/2. No free fluid or significant abdominal ascites. There is a small amount of primarily upper abdominal intraperitoneal air. No pneumatosis identified. Musculoskeletal: Presumed sebaceous cyst about the inferior left flank at 4.6 cm. Lumbosacral spondylosis. IMPRESSION: 1. Status post right hemicolectomy. Mild mid small bowel dilatation, without transition point. Favor postoperative adynamic ileus. No specific explanation for elevated white blood cell count. 2. Intraperitoneal air, somewhat prominent for postop day 6. No other cause identified. 3. Coronary artery atherosclerosis. Aortic Atherosclerosis (ICD10-I70.0). Electronically Signed   By: Abigail Miyamoto M.D.   On: 09/05/2020 14:44               LOS: 6 days   Henry Hill  [facility]    Pager on [email or web address]. If 7PM-7AM, please contact night-coverage at [email or web address]     09/05/2020, 3:15 PM

## 2020-09-05 NOTE — Discharge Instructions (Signed)
In addition to included general post-operative instructions,  Diet: Resume home diet.   Activity: No heavy lifting >20 pounds (children, pets, laundry, garbage) for 6 weeks, but light activity and walking are encouraged. Do not drive or drink alcohol if taking narcotic pain medications or having pain that might distract from driving.  Surgical Drain: Monitor and record drain out put daily. I did provide you with a handout for this.   Wound care: If you can keep drain site waterproofed, you may shower/get incision wet with soapy water and pat dry (do not rub incisions), but no baths or submerging incision underwater until follow-up. If not, do not shower until drain removed  Medications: Resume all home medications. For mild to moderate pain: acetaminophen (Tylenol) or ibuprofen/naproxen (if no kidney disease). Combining Tylenol with alcohol can substantially increase your risk of causing liver disease. Narcotic pain medications, if prescribed, can be used for severe pain, though may cause nausea, constipation, and drowsiness. Do not combine Tylenol and Percocet (or similar) within a 6 hour period as Percocet (and similar) contain(s) Tylenol. If you do not need the narcotic pain medication, you do not need to fill the prescription.  Call office [phone] / [phone]) at any time if any questions, worsening pain, fevers/chills, bleeding, drainage from incision site, or other concerns.

## 2020-09-05 NOTE — Progress Notes (Signed)
Progress Note  Patient Name: Henry Hill Date of Encounter: 09/05/2020  Primary Cardiologist: [name], MD  Subjective   Feels well this AM.  No chest pain, dyspnea, palpitations.  Abd feeling better.  Due for repeat CT.  Though HRs elevated much of the night, he converted to sinus this AM @ 0736.  Inpatient Medications    Scheduled Meds: . apixaban  5 mg Oral BID  . Chlorhexidine Gluconate Cloth  6 each Topical Daily  . digoxin  0.25 mg Oral Daily  . diltiazem  240 mg Oral Daily  . feeding supplement  237 mL Oral BID BM  . insulin aspart  0-9 Units Subcutaneous Q4H  . iohexol  500 mL Oral Q1H  . metoprolol tartrate  50 mg Oral BID  . pantoprazole (PROTONIX) IV  40 mg Intravenous QHS  . sodium chloride flush  10-40 mL Intracatheter Q12H   Continuous Infusions:  PRN Meds: HYDROmorphone (DILAUDID) injection, menthol-cetylpyridinium, metoprolol tartrate, ondansetron **OR** ondansetron (ZOFRAN) IV, oxyCODONE, phenol, polyethylene glycol, sodium chloride flush   Vital Signs    Vitals:   09/04/20 2005 09/04/20 2333 09/05/20 0520 09/05/20 0815  BP: 125/84 125/88 (!) 141/79 124/79  Pulse: 76 74 69 84  Resp: 20  20 18   Temp: (!) 97.5 F (36.4 C)  97.7 F (36.5 C) (!) 97.5 F (36.4 C)  TempSrc: Oral  Oral Oral  SpO2: 97%  98% 98%  Weight:      Height:        Intake/Output Summary (Last 24 hours) at 09/05/2020 0953 Last data filed at 09/05/2020 0532 Gross per 24 hour  Intake 911.29 ml  Output 1225 ml  Net -313.71 ml   Filed Weights   09/02/20 0700 09/03/20 1141 09/04/20 1547  Weight: 81.6 kg 80.7 kg 80.6 kg    Physical Exam   GEN: Well nourished, well developed, in no acute distress.  HEENT: Grossly normal.  Neck: Supple, no JVD, carotid bruits, or masses. Cardiac: RRR, no murmurs, rubs, or gallops. No clubbing, cyanosis, edema.  Radials 2+, DP/PT 2+ and equal bilaterally.  Respiratory:  Respirations regular and unlabored, diminished breath sounds @  bilat bases.   GI: Soft, mild incisional tenderness w/o erythema/drainage.  Drain intact.  BS + x 4. MS: no deformity or atrophy. Skin: warm and dry, no rash. Neuro:  Strength and sensation are intact. Psych: AAOx3.  Normal affect.  Labs    Chemistry Recent Labs  Lab 08/30/20 [phone] 08/30/20 1014 09/01/20 0618 09/02/20 0710 09/03/20 0649 09/05/20 0523  NA 125*   < > 132* 131* 129* 132*  K 3.3*   < > 3.4* 3.7 3.7 4.3  CL 75*   < > 101 100 99 103  CO2 28   < > 25 24 23  21*  GLUCOSE 177*   < > 109* 112* 122* 93  BUN 72*   < > 19 10 11 12   CREATININE 1.62*   < > 0.60* 0.55* 0.56* 0.53*  CALCIUM 9.9   < > 7.6* 8.2* 8.3* 8.5*  PROT 8.0  --  5.5*  --   --   --   ALBUMIN 4.6  --  2.7*  --   --   --   AST 54*  --  19  --   --   --   ALT 55*  --  28  --   --   --   ALKPHOS 54  --  27*  --   --   --  BILITOT 1.5*  --  0.8  --   --   --   GFRNONAA 48*   < > >60 >60 >60 >60  ANIONGAP 22*   < > 6 7 7 8    < > = values in this interval not displayed.     Hematology Recent Labs  Lab 09/01/20 0618 09/02/20 0753 09/05/20 0523  WBC 9.0 9.1 14.9*  RBC 3.20* 3.13* 3.83*  HGB 10.8* 11.3* 12.7*  HCT 31.7* 34.5* 37.0*  MCV 99.1 110.2* 96.6  MCH 33.8 36.1* 33.2  MCHC 34.1 32.8 34.3  RDW 13.1 13.7 12.7  PLT 234 260 383    Cardiac Enzymes  Recent Labs  Lab 08/30/20 0907 08/30/20 1206 08/30/20 2110  TROPONINIHS 33* 36* 20*      BNP Recent Labs  Lab 09/03/20 0649  BNP 436.6*     Lipids  Lab Results  Component Value Date   CHOL 151 07/03/2019   HDL 100 07/03/2019   LDLCALC 42 07/03/2019   TRIG 48 09/01/2020   CHOLHDL 1.5 07/03/2019    HbA1c  Lab Results  Component Value Date   HGBA1C 5.4 08/31/2020    Radiology    No results found.  Telemetry    Afib to 130's  converted to sinus rhythm 90's to low 100's @ 0736 this AM - Personally Reviewed  ECG    Pending this AM - RSR on tele - Personally Reviewed  Cardiac Studies   2D Echocardiogram 4.24.22   1.  Left ventricular ejection fraction, by estimation, is 50 to 55%. The  left ventricle has low normal function. The left ventricle has no regional  wall motion abnormalities. There is mild concentric left ventricular  hypertrophy. Left ventricular  diastolic parameters are consistent with Grade I diastolic dysfunction  (impaired relaxation).   2. Right ventricular systolic function is normal. The right ventricular  size is normal.   3. The mitral valve is normal in structure. Trivial mitral valve  regurgitation. No evidence of mitral stenosis.   4. The aortic valve is normal in structure. Aortic valve regurgitation is  not visualized. No aortic stenosis is present.   5. The inferior vena cava is normal in size with greater than 50%  respiratory variability, suggesting right atrial pressure of 3 mmHg.   Patient Profile     60 y.o. 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  Assessment & Plan    1.  Bowel obstruction/Cecal Volvulus:  S/p ex lap and R colectomy on 4/23.  Toleratign POs.  Leukocytosis this AM and plan for repeat CT per surgery.  2.  Afib w/ RVR:  Though rates elevated overnight, he converted to sinus this AM @ 0736.  Rates currently 80's to 90's.  I've consolidated his dilt to 240 daily, to start this AM. Cont metoprolol 50 bid.  Digoxin started yesterday, not clear that he'll require going forward - will d/w Dr. Rockey Situ.  Cont eliquis - 5 bid.  We discussed the importance of etoh cessation as it pertains to afib.  He was drinking 3-4 beers/night.  His wife also notes that he snores loudly.  We will arrange for outpt sleep eval @ f/u.  3.  Elevated HsTrop/Demand Ischemia:  Mild HsTrop elevation w/ flat trend in the setting of #1 - 33  36  20.  ECGs w/ variable degrees of  ST depression in the setting of LVH and tachycardia. He denies any  h/o chest pain or dyspnea.  This does not represent ACS.  Suspect demand ischemia.  Reassured by echo - EF 50-55% w/o rwma.  CT abd did show Ao atherosclerosis, though lipid panel from last year impressive w/ HDL of 100 and LDL of 42 (statin naive). Will plan on outpt myoview for risk stratification.  4.  Essential HTN:  Stable on  blocker and dilt. Was on nifedipine @ home.  This will be replaced w/ dilt CD in setting of #2.  5.  AKI:  In setting of #1.  Resolved.  6.  Tob/Alcohol abuse:  Cessation advised.  He says that he's ready to turn another leaf.  7.  ? OSA: Snores loudly per wife.  Will plan outpt sleep eval.  Signed, Murray Hodgkins, NP  09/05/2020, 9:53 AM    For questions or updates, please contact   Please consult [email or web address] for contact info under Cardiology/STEMI.

## 2020-09-05 NOTE — Progress Notes (Signed)
[place] Hospital Day(s): 6.   Post op day(s): 6 Days Post-Op.   Interval History:  Patient seen and examined No acute events or new complaints overnight.  Patient reports he is doing well, remains anxious to go home He does have incisional soreness but this is mostly when getting up No fever, chills, nausea, emesis He had   continued to have issues with atrial fibrillation with varying jumps in HR yesterday; cardiology now on board; currently on 0.25 mg Digoxin daily, 60 mg Cardizem q6, and 50 mg metoprolol BID Interestingly, he did have a bump in his leukocytosis to 14.9K this morning Maintained normal renal function; sCr - 0.53; UO - 1.2L Persistent, mild, hyponatremia to 132 but o/w no significant electrolyte derangements  Surgical drain with 175 ccs out overnight; serous He continues to have bowel function; multiple BMs recorded He was able to ambulate yesterday   Vital signs in last 24 hours: [min-max] current  Temp:  [97.5 F (36.4 C)-98.7 F (37.1 C)] 97.5 F (36.4 C) (04/29 0815) Pulse Rate:  [69-104] 84 (04/29 0815) Resp:  [16-20] 18 (04/29 0815) BP: (97-141)/(60-88) 124/79 (04/29 0815) SpO2:  [97 %-100 %] 98 % (04/29 0815) Weight:  [80.6 kg] 80.6 kg (04/28 1547)     Height: 6\' 1"  (185.4 cm) Weight: 80.6 kg BMI (Calculated): 23.46   Intake/Output last 2 shifts:  04/28 0701 - 04/29 0700 In: 911.3 [P.O.:480; I.V.:431.3] Out: 1425 [Urine:1250; Drains:175]   Physical Exam:  Constitutional: alert, cooperative and no distress Respiratory: breathing non-labored at rest Cardiovascular: On monitor at time of our evaluation he did appear in NSR at 31 Gastrointestinal:Soft, incisional soreness, non-distended, no rebound/guarding, surgical drain in place with serous output Integumentary:Laparotomy incision is CDI with staples, no erythema, no drainage  Labs:  CBC Latest Ref Rng & Units 09/05/2020 09/02/2020 09/01/2020  WBC 4.0 -  10.5 K/uL 14.9(H) 9.1 9.0  Hemoglobin 13.0 - 17.0 g/dL 12.7(L) 11.3(L) 10.8(L)  Hematocrit 39.0 - 52.0 % 37.0(L) 34.5(L) 31.7(L)  Platelets 150 - 400 K/uL 383 260 234   CMP Latest Ref Rng & Units 09/05/2020 09/03/2020 09/02/2020  Glucose 70 - 99 mg/dL 93 122(H) 112(H)  BUN 6 - 20 mg/dL 12 11 10   Creatinine 0.61 - 1.24 mg/dL 0.53(L) 0.56(L) 0.55(L)  Sodium 135 - 145 mmol/L 132(L) 129(L) 131(L)  Potassium 3.5 - 5.1 mmol/L 4.3 3.7 3.7  Chloride 98 - 111 mmol/L 103 99 100  CO2 22 - 32 mmol/L 21(L) 23 24  Calcium 8.9 - 10.3 mg/dL 8.5(L) 8.3(L) 8.2(L)  Total Protein 6.5 - 8.1 g/dL - - -  Total Bilirubin 0.3 - 1.2 mg/dL - - -  Alkaline Phos 38 - 126 U/L - - -  AST 15 - 41 U/L - - -  ALT 0 - 44 U/L - - -     Imaging studies: No new pertinent imaging studies   Assessment/Plan:  60 y.o. male with increase in leukocytosis 6 Days Post-Op s/p exploratory laparotomy and right colectomyforcecal volvulus, complicated by pertinent comorbidities includingnew onset atrial fibrillation currently rate controlled   - Given his increase in leukocytosis this morning, although he is without any other gross evidence of infection, we will plan on CT Abdomen/Pelvis to evaluate for potential intra-abdominal abscess/infection/process to explain increase in WBC   - Okay to soft diet   - Monitor abdominal examination; on-going bowel function - Pain control prn; antiemetics prn - Mobilization as tolerated   - Appreciate cardiology assistance; current  regimen is 0.25 mg Digoxin daily, 60 mg Cardizem q6, and 50 mg metoprolol BID  - Appreciate medicine assistance   - Discharge Planning: Pending CT Abdomen/Pelvis for reassessment as well as stabilization from a cardiology standpoint. In the best case scenario, he may be ready for home this evening, but I anticipate still another 24-48 hours (or longer) depending on his work up today.   All of the above findings and recommendations were  discussed with the patient, patient's family (aife at bedside), and the medical team, and all of patient's and family's questions were answered to their expressed satisfaction.  -- Edison Simon, PA-C [facility] 09/05/2020, 8:39 AM [phone] M-F: 7am - 4pm

## 2020-09-05 NOTE — Consult Note (Signed)
PHARMACY CONSULT NOTE - FOLLOW UP  Pharmacy Consult for Electrolyte Monitoring and Replacement   Recent Labs: Potassium (mmol/L)  Date Value  09/05/2020 4.3   Magnesium (mg/dL)  Date Value  09/05/2020 2.0   Calcium (mg/dL)  Date Value  09/05/2020 8.5 (L)   Albumin (g/dL)  Date Value  09/01/2020 2.7 (L)  07/03/2019 4.5   Phosphorus (mg/dL)  Date Value  09/05/2020 3.6   Sodium (mmol/L)  Date Value  09/05/2020 132 (L)  07/03/2019 130 (L)     Assessment: 60 y.o.malepresenting with a 5 day history of nausea, vomiting and 3 day history of worsening abdominal pain. He has been unable to keep solid po intake, but was able to drink some fluids. However, given the worsening pain, he presented today to the ER for evaluation. In the ER, he has remained tachycardic, with abdominal distention. Pt underwent (4/23) ex laparotomy & rt colectomy post-op c/b ileus and Afib. Pt initially managed with TPN (4/23-28) until ileus resolved and pt is advancing diet to soft foods. Afib req'd diltiazem gtt (4/23-27) to maintain rate now transitioned to PO dilt. Pharmacy has been consulted for electrolyte management.   Electrolytes: Na 129>132 (improving/low) K 3.7>4.3 (WNL) Phos:3.9>3.6 (WNL) - all others WNL;   Goal of Therapy:  Lytes WNL  Plan:  Sodium and Calcium trends improving,  Other lytes stable WNL today. No further repletion today.  Lorna Dibble ,PharmD Clinical Pharmacist 09/05/2020 7:55 AM

## 2020-09-06 DIAGNOSIS — I10 Essential (primary) hypertension: Secondary | ICD-10-CM

## 2020-09-06 DIAGNOSIS — I4891 Unspecified atrial fibrillation: Secondary | ICD-10-CM

## 2020-09-06 LAB — BASIC METABOLIC PANEL
Anion gap: 13 (ref 5–15)
BUN: 38 mg/dL — ABNORMAL HIGH (ref 6–20)
CO2: 17 mmol/L — ABNORMAL LOW (ref 22–32)
Calcium: 8.4 mg/dL — ABNORMAL LOW (ref 8.9–10.3)
Chloride: 97 mmol/L — ABNORMAL LOW (ref 98–111)
Creatinine, Ser: 1.68 mg/dL — ABNORMAL HIGH (ref 0.61–1.24)
GFR, Estimated: 46 mL/min — ABNORMAL LOW (ref >60)
Glucose, Bld: 108 mg/dL — ABNORMAL HIGH (ref 70–99)
Potassium: 4.2 mmol/L (ref 3.5–5.1)
Sodium: 127 mmol/L — ABNORMAL LOW (ref 135–145)

## 2020-09-06 LAB — CBC
HCT: 37.2 % — ABNORMAL LOW (ref 39.0–52.0)
Hemoglobin: 13.1 g/dL (ref 13.0–17.0)
MCH: 33.2 pg (ref 26.0–34.0)
MCHC: 35.2 g/dL (ref 30.0–36.0)
MCV: 94.2 fL (ref 80.0–100.0)
Platelets: 374 10*3/uL (ref 150–400)
RBC: 3.95 MIL/uL — ABNORMAL LOW (ref 4.22–5.81)
RDW: 12.8 % (ref 11.5–15.5)
WBC: 16.7 10*3/uL — ABNORMAL HIGH (ref 4.0–10.5)
nRBC: 0 % (ref 0.0–0.2)

## 2020-09-06 LAB — CULTURE, BLOOD (SINGLE): Culture: NO GROWTH

## 2020-09-06 LAB — PHOSPHORUS: Phosphorus: 6 mg/dL — ABNORMAL HIGH (ref 2.5–4.6)

## 2020-09-06 LAB — GLUCOSE, CAPILLARY
Glucose-Capillary: 110 mg/dL — ABNORMAL HIGH (ref 70–99)
Glucose-Capillary: 134 mg/dL — ABNORMAL HIGH (ref 70–99)
Glucose-Capillary: 119 mg/dL — ABNORMAL HIGH (ref 70–99)
Glucose-Capillary: 132 mg/dL — ABNORMAL HIGH (ref 70–99)

## 2020-09-06 LAB — MAGNESIUM: Magnesium: 1.9 mg/dL (ref 1.7–2.4)

## 2020-09-06 MED ORDER — LACTATED RINGERS IV SOLN: INTRAVENOUS | Status: DC

## 2020-09-06 MED ORDER — SODIUM BICARBONATE 8.4 % IV SOLN
INTRAVENOUS | Status: DC
  Filled 2020-09-06 (×2): qty 1000
  Filled 2020-09-06 (×2): qty 150
  Filled 2020-09-06: qty 1000

## 2020-09-06 NOTE — Progress Notes (Addendum)
Progress Note  Patient Name: Henry Hill Date of Encounter: 09/06/2020  Primary Cardiologist: [name], MD  Subjective   Multiple soft to loose stools throughout the night. No abd pain.  Maintaining sinus rhythm.  Tolerating breakfast this AM.  Inpatient Medications    Scheduled Meds: . apixaban  5 mg Oral BID  . Chlorhexidine Gluconate Cloth  6 each Topical Daily  . diltiazem  180 mg Oral Daily  . feeding supplement  237 mL Oral BID BM  . insulin aspart  0-9 Units Subcutaneous Q4H  . metoprolol tartrate  50 mg Oral BID  . pantoprazole (PROTONIX) IV  40 mg Intravenous QHS  . sodium chloride flush  10-40 mL Intracatheter Q12H   Continuous Infusions: . sodium bicarbonate 150 mEq in D5W infusion     PRN Meds: HYDROmorphone (DILAUDID) injection, menthol-cetylpyridinium, metoprolol tartrate, ondansetron **OR** ondansetron (ZOFRAN) IV, oxyCODONE, phenol, polyethylene glycol, sodium chloride flush   Vital Signs    Vitals:   09/05/20 1700 09/05/20 2143 09/06/20 0448 09/06/20 0904  BP: 99/76 114/79 115/80 114/79  Pulse: 100 (!) 103 87 81  Resp: 18 18 17 17   Temp: 97.9 F (36.6 C) 97.9 F (36.6 C) 98.3 F (36.8 C) 97.7 F (36.5 C)  TempSrc: Oral Oral Oral Oral  SpO2: 99% 95% 95% 98%  Weight:      Height:        Intake/Output Summary (Last 24 hours) at 09/06/2020 0929 Last data filed at 09/06/2020 0500 Gross per 24 hour  Intake 720 ml  Output 700 ml  Net 20 ml   Filed Weights   09/02/20 0700 09/03/20 1141 09/04/20 1547  Weight: 81.6 kg 80.7 kg 80.6 kg    Physical Exam   GEN: Well nourished, well developed, in no acute distress.  HEENT: Grossly normal.  Neck: Supple, no JVD, carotid bruits, or masses. Cardiac: RRR, no murmurs, rubs, or gallops. No clubbing, cyanosis, edema.  Radials 2+, DP/PT 2+ and equal bilaterally.  Respiratory:  Respirations regular and unlabored, diminished breath sounds @ bilat bases. GI: Soft, nontender, nondistended, BS + x 4.   Drain intact.  Incision w/o erythema/drainage. MS: no deformity or atrophy. Skin: warm and dry, no rash. Neuro:  Strength and sensation are intact. Psych: AAOx3.  Normal affect.  Labs    Chemistry Recent Labs  Lab 09/01/20 0618 09/02/20 0710 09/03/20 0649 09/05/20 0523 09/06/20 0455  NA 132*   < > 129* 132* 127*  K 3.4*   < > 3.7 4.3 4.2  CL 101   < > 99 103 97*  CO2 25   < > 23 21* 17*  GLUCOSE 109*   < > 122* 93 108*  BUN 19   < > 11 12 38*  CREATININE 0.60*   < > 0.56* 0.53* 1.68*  CALCIUM 7.6*   < > 8.3* 8.5* 8.4*  PROT 5.5*  --   --   --   --   ALBUMIN 2.7*  --   --   --   --   AST 19  --   --   --   --   ALT 28  --   --   --   --   ALKPHOS 27*  --   --   --   --   BILITOT 0.8  --   --   --   --   GFRNONAA >60   < > >60 >60 46*  ANIONGAP 6   < > 7  8 13   < > = values in this interval not displayed.     Hematology Recent Labs  Lab 09/02/20 0753 09/05/20 0523 09/06/20 0455  WBC 9.1 14.9* 16.7*  RBC 3.13* 3.83* 3.95*  HGB 11.3* 12.7* 13.1  HCT 34.5* 37.0* 37.2*  MCV 110.2* 96.6 94.2  MCH 36.1* 33.2 33.2  MCHC 32.8 34.3 35.2  RDW 13.7 12.7 12.8  PLT 260 383 374    Cardiac Enzymes  Recent Labs  Lab 08/30/20 0907 08/30/20 1206 08/30/20 2110  TROPONINIHS 33* 36* 20*      BNP Recent Labs  Lab 09/03/20 0649  BNP 436.6*     Lipids  Lab Results  Component Value Date   CHOL 151 07/03/2019   HDL 100 07/03/2019   LDLCALC 42 07/03/2019   TRIG 48 09/01/2020   CHOLHDL 1.5 07/03/2019    HbA1c  Lab Results  Component Value Date   HGBA1C 5.4 08/31/2020    Radiology    CT ABDOMEN PELVIS W CONTRAST  Result Date: 09/05/2020 CLINICAL DATA:  Postop day 6 from right colectomy with leukocytosis. Rule out infection. 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 EXAM: CT ABDOMEN AND PELVIS WITH CONTRAST TECHNIQUE: Multidetector CT imaging of the abdomen and pelvis was performed using the standard protocol following bolus administration of intravenous contrast.  CONTRAST:  126mL OMNIPAQUE IOHEXOL 300 MG/ML  SOLN COMPARISON:  08/30/2020 FINDINGS: Lower chest: Clear lung bases. Normal heart size without pericardial or pleural effusion. Right coronary artery calcification. Hepatobiliary: Normal liver. Normal gallbladder, without biliary ductal dilatation. Pancreas: Normal, without mass or ductal dilatation. Spleen: Normal in size, without focal abnormality. Adrenals/Urinary Tract: Normal adrenal glands. Left renal too small to characterize lesions. Normal right kidney, without hydronephrosis. The bladder is decompressed. Stomach/Bowel: Normal stomach, without wall thickening. Status post right hemicolectomy. No fluid collection at the operative site. Mid small bowel loops measure up to the 3.2 cm, without focal transition identified. Vascular/Lymphatic: Aortic atherosclerosis. 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 No abdominopelvic adenopathy. Reproductive: Mild prostatomegaly. Other: Surgical drain terminates in the left hemipelvis on 66/2. No free fluid or significant abdominal ascites. There is a small amount of primarily upper abdominal intraperitoneal air. No pneumatosis identified. Musculoskeletal: Presumed sebaceous cyst about the inferior left flank at 4.6 cm. Lumbosacral spondylosis. IMPRESSION: 1. Status post right hemicolectomy. Mild mid small bowel dilatation, without transition point. Favor postoperative adynamic ileus. No specific explanation for elevated white blood cell count. 2. Intraperitoneal air, somewhat prominent for postop day 6. No other cause identified. 3. Coronary artery atherosclerosis. Aortic Atherosclerosis (ICD10-I70.0). Electronically Signed   By: Abigail Miyamoto M.D.   On: 09/05/2020 14:44    Telemetry    RSR - sinus tachycardia - mostly trending in 80's - Personally Reviewed  ECG    4/29 @ 1357: RSR, 91, LAE, nonspecific ST/T changes - Personally Reviewed  Cardiac Studies   2D Echocardiogram 4.24.22  1. Left ventricular ejection  fraction, by estimation, is 50 to 55%. The  left ventricle has low normal function. The left ventricle has no regional  wall motion abnormalities. There is mild concentric left ventricular  hypertrophy. Left ventricular  diastolic parameters are consistent with Grade I diastolic dysfunction  (impaired relaxation).  2. Right ventricular systolic function is normal. The right ventricular  size is normal.  3. The mitral valve is normal in structure. Trivial mitral valve  regurgitation. No evidence of mitral stenosis.  4. The aortic valve is normal in structure. Aortic valve regurgitation is  not visualized. No  aortic stenosis is present.  5. The inferior vena cava is normal in size with greater than 50%  respiratory variability, suggesting right atrial pressure of 3 mmHg.   Patient Profile     60 y.o. 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  Assessment & Plan    1.  Bowel obstruction/cecalvolvulus:  S/p ex lap and R colectomy on 4/23.  Feels well this AM. Repeat CT yesterday in setting of leukocytosis w/ mild mid small bowel dilatation, without transition point. Favor postoperative adynamic ileus - no explanation for leukocytosis. Multiple soft to loose stools overnight.  He thinks oral contrast caused it.  Tolerating bfast this AM.  2.  Afib RVR:  Converted to sinus on 4/29 @ 0736.  Maintaining sinus rhythm since.  Rates mostly 80's.  Cont dilt 180 (dose prev reduced due to soft BPs) and metoprolol 50 bid.  CHA2DS2VASc = 1-2 (HTN, Ao atherosclerosis noted on CT).  Cont eliquis.  3.  Elevated HsTrop/Demand ischemia:  Mild HsTrop elevation w/ flat trend in the setting of #1 - 33 3620. ECGs w/ variable degrees of ST depression in the setting of LVH and tachycardia. He denies any h/o chest pain or dyspnea. This does not represent ACS. Suspect  demand ischemia. Reassured by echo - EF 50-55% w/o rwma. CT abd did show Ao atherosclerosis, though lipid panel from last year impressive w/ HDL of 100 and LDL of 42 (statin naive). Will plan on outpt myoview for risk stratification.  4. Essential HTN:  Stable on  blocker and dilt.  5.  AKI:  Present on admission. Initially resolved, however creat up to 1.68 this AM.  He did receive contrast yesterday (IV and oral) and had multiple soft to loose stools overnight.  Avoiding nephrotoxic agents. IVF per surgery.  Encouraged PO fluids as well.  6.  Tob/alcohol abuse:  Cessation advised.    7.  ? OSA:  Snores loudly per wife.  Will plan outpt sleep eval.  Signed, Murray Hodgkins, NP  09/06/2020, 9:29 AM    For questions or updates, please contact   Please consult [email or web address] for contact info under Cardiology/STEMI.

## 2020-09-06 NOTE — Progress Notes (Signed)
Mobility Specialist - Progress Note   09/06/20 1400  Mobility  Activity Ambulated in hall  Level of Assistance Modified independent, requires aide device or extra time  [place] wheel walker  Distance Ambulated (ft) 180 ft  Mobility Response Tolerated well  Mobility performed by Mobility specialist  $Mobility charge 1 Mobility    Pt ambulated in hallway with RW. No LOB. Denied dizziness/lightheadedness. Denied SOB on RA. HR ranging between 70-85 bpm throughout session. Pt reports a little nasal congestion d/t leaving AC on last night, but reports no pain/fatigue. Family at bedside.    Kathee Delton Mobility Specialist 09/06/20, 2:19 PM

## 2020-09-06 NOTE — Consult Note (Signed)
PHARMACY CONSULT NOTE - FOLLOW UP  Pharmacy Consult for Electrolyte Monitoring and Replacement   Recent Labs: Potassium (mmol/L)  Date Value  09/06/2020 4.2   Magnesium (mg/dL)  Date Value  09/06/2020 1.9   Calcium (mg/dL)  Date Value  09/06/2020 8.4 (L)   Albumin (g/dL)  Date Value  09/01/2020 2.7 (L)  07/03/2019 4.5   Phosphorus (mg/dL)  Date Value  09/06/2020 6.0 (H)   Sodium (mmol/L)  Date Value  09/06/2020 127 (L)  07/03/2019 130 (L)     Assessment: 60 y.o.malepresenting with a 5 day history of nausea, vomiting and 3 day history of worsening abdominal pain. He has been unable to keep solid po intake, but was able to drink some fluids. However, given the worsening pain, he presented today to the ER for evaluation. In the ER, he has remained tachycardic, with abdominal distention. Pt underwent (4/23) ex laparotomy & rt colectomy post-op c/b ileus and Afib. Pt initially managed with TPN (4/23-28) until ileus resolved and pt is advancing diet to soft foods. Afib req'd diltiazem gtt (4/23-27) to maintain rate now transitioned to PO dilt. Pharmacy has been consulted for electrolyte management.  On soft diet as of 4/28  Electrolytes: Na 129>132>127 (low) K 3.7>4.3>4.2 (WNL) Phos:3.9>3.6>6.0 (high) Mag 2.0>1.9 (WNL/slightly low)  Goal of Therapy:  Lytes WNL  Plan:  Scr jump 0.53 > 1.68 after contrast yesterday 4/29. No electrolyte repletion at this time. F/u electrolytes in am  Noralee Space ,PharmD Clinical Pharmacist 09/06/2020 8:07 AM

## 2020-09-06 NOTE — Progress Notes (Signed)
Patient ID: Henry Hill, male   DOB: 04/01/1961, 60 y.o.   MRN: [id number]     [place] Hospital Day(s): 7.   Post op day(s): 7 Days Post-Op.   Interval History: Patient seen and examined, no acute events or new complaints overnight. Patient reports feeling well.  He denies any abdominal pain.  He reports tolerating diet.  He report he has been able to ambulate without problem.  He denies any problem with wounds.  Vital signs in last 24 hours: [min-max] current  Temp:  [97.7 F (36.5 C)-98.3 F (36.8 C)] 97.7 F (36.5 C) (04/30 0904) Pulse Rate:  [81-103] 81 (04/30 0904) Resp:  [16-18] 17 (04/30 0904) BP: (99-115)/(74-82) 114/79 (04/30 0904) SpO2:  [95 %-99 %] 98 % (04/30 0904)     Height: 6\' 1"  (185.4 cm) Weight: 80.6 kg BMI (Calculated): 23.46   Physical Exam:  Constitutional: alert, cooperative and no distress  Respiratory: breathing non-labored at rest  Cardiovascular: regular rate and sinus rhythm  Gastrointestinal: soft, non-tender, and non-distended  Labs:  CBC Latest Ref Rng & Units 09/06/2020 09/05/2020 09/02/2020  WBC 4.0 - 10.5 K/uL 16.7(H) 14.9(H) 9.1  Hemoglobin 13.0 - 17.0 g/dL 13.1 12.7(L) 11.3(L)  Hematocrit 39.0 - 52.0 % 37.2(L) 37.0(L) 34.5(L)  Platelets 150 - 400 K/uL 374 383 260   CMP Latest Ref Rng & Units 09/06/2020 09/05/2020 09/03/2020  Glucose 70 - 99 mg/dL 108(H) 93 122(H)  BUN 6 - 20 mg/dL 38(H) 12 11  Creatinine 0.61 - 1.24 mg/dL 1.68(H) 0.53(L) 0.56(L)  Sodium 135 - 145 mmol/L 127(L) 132(L) 129(L)  Potassium 3.5 - 5.1 mmol/L 4.2 4.3 3.7  Chloride 98 - 111 mmol/L 97(L) 103 99  CO2 22 - 32 mmol/L 17(L) 21(L) 23  Calcium 8.9 - 10.3 mg/dL 8.4(L) 8.5(L) 8.3(L)  Total Protein 6.5 - 8.1 g/dL - - -  Total Bilirubin 0.3 - 1.2 mg/dL - - -  Alkaline Phos 38 - 126 U/L - - -  AST 15 - 41 U/L - - -  ALT 0 - 44 U/L - - -    Imaging studies:    Assessment/Plan:  60 y.o. male with cecal volvulus 7 Days Post-Op s/p exploratory laparotomy with  right colectomy, complicated by pertinent comorbidities including atrial fibrillation on anticoagulation, hypertension, acute kidney injury.  Patient recovering well from the surgical standpoint without any sign of complication at this moment but today with increased creatinine.  Patient most likely with AKI due to contrast nephropathy.  Patient is able to drink plenty of water.  Agree with hospitalist to add fluids IV.  Currently with stable vital signs without fever.  Patient upset because he will not be able to be discharged today but he was oriented that this was for his safety.  We will continue with current diet.  I encouraged the patient to ambulate.     Arnold Long, MD

## 2020-09-06 NOTE — Progress Notes (Signed)
Progress Note    Henry Hill  GBT:[id number] DOB: January 18, 1961  DOA: 08/30/2020 PCP: Margo Common, PA-C      Brief Narrative:    Medical records reviewed and are as summarized below:  Henry Hill is a 60 y.o. 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    Atrial fibrillation with RVR: Converted to normal sinus rhythm.  Continue metoprolol, Cardizem and Eliquis.   2D echo showed EF estimated at 50 to 71%, grade 1 diastolic dysfunction.  CHA2DS2-VASc score:1  Acute kidney injury with non-anion gap metabolic acidosis: Patient has significant diarrhea overnight.  He also had a CT scan yesterday with IV contrast exposure.  Probably a combination of diarrhea and contrast-induced acute kidney injury.  Started IV sodium bicarbonate infusion.  Monitor BMP.  Cecal volvulus: Status post exploratory laparotomy and right colectomy on 08/30/2020.  Follow-up with general surgeon.  Elevated troponins: Likely due to demand ischemia  Hypertension: Continue metoprolol and Cardizem.  Patient was taking nifedipine at home.  Acute on chronic hyponatremia: Asymptomatic.  Monitor BMP.  hypokalemia, hypophosphatemia: Resolved  Discussed plan with general surgeons, Dr. Windell Moment and Dr. Hampton Abbot, via secure chat  Diet Order            DIET SOFT Room service appropriate? Yes; Fluid consistency: Thin  Diet effective now                    Consultants:  General surgeon  Procedures:  Ex lap, right colectomy on 08/30/2020    Medications:   . apixaban  5 mg Oral BID  . Chlorhexidine Gluconate Cloth  6 each Topical Daily  . diltiazem  180 mg Oral Daily  . feeding supplement  237 mL Oral  BID BM  . insulin aspart  0-9 Units Subcutaneous Q4H  . metoprolol tartrate  50 mg Oral BID  . pantoprazole (PROTONIX) IV  40 mg Intravenous QHS  . sodium chloride flush  10-40 mL Intracatheter Q12H   Continuous Infusions: . sodium bicarbonate 150 mEq in D5W infusion 125 mL/hr at 09/06/20 1008     Anti-infectives (From admission, onward)   Start     Dose/Rate Route Frequency Ordered Stop   08/30/20 1800  piperacillin-tazobactam (ZOSYN) IVPB 3.375 g  Status:  Discontinued        3.375 g 12.5 mL/hr over 240 Minutes Intravenous Every 8 hours 08/30/20 1321 09/03/20 1433   08/30/20 0945  piperacillin-tazobactam (ZOSYN) IVPB 3.375 g        3.375 g 100 mL/hr over 30 Minutes Intravenous  Once 08/30/20 0626 08/30/20 1108             Family Communication/Anticipated D/C date and plan/Code Status   DVT prophylaxis: SCDs Start: 08/31/20 1233 SCDs Start: 08/30/20 1304 apixaban (ELIQUIS) tablet 5 mg     Code Status: Full Code  Family Communication: Wife at the bedside Disposition Plan:    Status is: Inpatient  Remains inpatient appropriate because:Inpatient level of care appropriate due to severity of illness   Dispo: The patient is from: Home              Anticipated d/c is to: Home  Patient currently is not medically stable to d/c.   Difficult to place patient No           Subjective:   Interval events noted.  He said he had severe diarrhea overnight.  Stools were watery and copious.   Objective:    Vitals:   09/06/20 0448 09/06/20 0904 09/06/20 1146 09/06/20 1521  BP: 115/80 114/79 123/84 119/79  Pulse: 87 81 77 72  Resp: 17 17 16 17   Temp: 98.3 F (36.8 C) 97.7 F (36.5 C) 97.8 F (36.6 C) 97.8 F (36.6 C)  TempSrc: Oral Oral Oral Oral  SpO2: 95% 98% 98% 97%  Weight:      Height:       No data found.   Intake/Output Summary (Last 24 hours) at 09/06/2020 1540 Last data filed at 09/06/2020 1130 Gross per 24 hour  Intake 960 ml   Output 950 ml  Net 10 ml   Filed Weights   09/02/20 0700 09/03/20 1141 09/04/20 1547  Weight: 81.6 kg 80.7 kg 80.6 kg    Exam:  GEN: NAD SKIN: No rash EYES: EOMI ENT: MMM CV: RRR PULM: CTA B ABD: soft, ND, NT, +BS, + surgical incision with intact staples CNS: AAO x 3, non focal EXT: No edema or tenderness        Data Reviewed:   I have personally reviewed following labs and imaging studies:  Labs: Labs show the following:   Basic Metabolic Panel: Recent Labs  Lab 09/01/20 0618 09/02/20 0710 09/03/20 0649 09/05/20 0523 09/06/20 0455  NA 132* 131* 129* 132* 127*  K 3.4* 3.7 3.7 4.3 4.2  CL 101 100 99 103 97*  CO2 25 24 23  21* 17*  GLUCOSE 109* 112* 122* 93 108*  BUN 19 10 11 12  38*  CREATININE 0.60* 0.55* 0.56* 0.53* 1.68*  CALCIUM 7.6* 8.2* 8.3* 8.5* 8.4*  MG 2.3 2.0 2.0 2.0 1.9  PHOS 1.4* 2.6 3.9 3.6 6.0*   GFR Estimated Creatinine Clearance: 52.8 mL/min (A) (by C-G formula based on SCr of 1.68 mg/dL (H)). Liver Function Tests: Recent Labs  Lab 09/01/20 0618  AST 19  ALT 28  ALKPHOS 27*  BILITOT 0.8  PROT 5.5*  ALBUMIN 2.7*   No results for input(s): LIPASE, AMYLASE in the last 168 hours. No results for input(s): AMMONIA in the last 168 hours. Coagulation profile No results for input(s): INR, PROTIME in the last 168 hours.  CBC: Recent Labs  Lab 08/31/20 0135 09/01/20 0618 09/02/20 0753 09/05/20 0523 09/06/20 0455  WBC 7.6 9.0 9.1 14.9* 16.7*  NEUTROABS  --  5.9  --   --   --   HGB 12.4* 10.8* 11.3* 12.7* 13.1  HCT 33.8* 31.7* 34.5* 37.0* 37.2*  MCV 91.8 99.1 110.2* 96.6 94.2  PLT 206 234 260 383 374   Cardiac Enzymes: No results for input(s): CKTOTAL, CKMB, CKMBINDEX, TROPONINI in the last 168 hours. BNP (last 3 results) No results for input(s): PROBNP in the last 8760 hours. CBG: Recent Labs  Lab 09/04/20 2025 09/05/20 0932 09/05/20 2040 09/06/20 0905 09/06/20 1148  GLUCAP 104* 105* 120* 110* 134*   D-Dimer: No  results for input(s): DDIMER in the last 72 hours. Hgb A1c: No results for input(s): HGBA1C in the last 72 hours. Lipid Profile: No results for input(s): CHOL, HDL, LDLCALC, TRIG, CHOLHDL, LDLDIRECT in the last 72 hours. Thyroid function studies: No results for input(s): TSH, T4TOTAL, T3FREE, THYROIDAB in the last 72 hours.  Invalid input(s): FREET3  Anemia work up: No results for input(s): VITAMINB12, FOLATE, FERRITIN, TIBC, IRON, RETICCTPCT in the last 72 hours. Sepsis Labs: Recent Labs  Lab 08/30/20 2251 08/31/20 0135 08/31/20 0135 09/01/20 0618 09/02/20 0753 09/05/20 0523 09/06/20 0455  WBC  --  7.6   < > 9.0 9.1 14.9* 16.7*  LATICACIDVEN 1.4 1.2  --   --   --   --   --    < > = values in this interval not displayed.    Microbiology Recent Results (from the past 240 hour(s))  Resp Panel by RT-PCR (Flu A&B, Covid) Nasopharyngeal Swab     Status: None   Collection Time: 08/30/20  9:08 AM   Specimen: Nasopharyngeal Swab; Nasopharyngeal(NP) swabs in vial transport medium  Result Value Ref Range Status   SARS Coronavirus 2 by RT PCR NEGATIVE NEGATIVE Final    Comment: (NOTE) SARS-CoV-2 target nucleic acids are NOT DETECTED.  The SARS-CoV-2 RNA is generally detectable in upper respiratory specimens during the acute phase of infection. The lowest concentration of SARS-CoV-2 viral copies this assay can detect is 138 copies/mL. A negative result does not preclude SARS-Cov-2 infection and should not be used as the sole basis for treatment or other patient management decisions. A negative result may occur with  improper specimen collection/handling, submission of specimen other than nasopharyngeal swab, presence of viral mutation(s) within the areas targeted by this assay, and inadequate number of viral copies(<138 copies/mL). A negative result must be combined with clinical observations, patient history, and epidemiological information. The expected result is Negative.  Fact  Sheet for Patients:  EntrepreneurPulse.com.au  Fact Sheet for Healthcare Providers:  IncredibleEmployment.be  This test is no t yet approved or cleared by the [place] FDA and  has been authorized for detection and/or diagnosis of SARS-CoV-2 by FDA under an Emergency Use Authorization (EUA). This EUA will remain  in effect (meaning this test can be used) for the duration of the COVID-19 declaration under Section 564(b)(1) of the Act, 21 U.S.C.section 360bbb-3(b)(1), unless the authorization is terminated  or revoked sooner.       Influenza A by PCR NEGATIVE NEGATIVE Final   Influenza B by PCR NEGATIVE NEGATIVE Final    Comment: (NOTE) The Xpert Xpress SARS-CoV-2/FLU/RSV plus assay is intended as an aid in the diagnosis of influenza from Nasopharyngeal swab specimens and should not be used as a sole basis for treatment. Nasal washings and aspirates are unacceptable for Xpert Xpress SARS-CoV-2/FLU/RSV testing.  Fact Sheet for Patients: EntrepreneurPulse.com.au  Fact Sheet for Healthcare Providers: IncredibleEmployment.be  This test is not yet approved or cleared by the [place] FDA and has been authorized for detection and/or diagnosis of SARS-CoV-2 by FDA under an Emergency Use Authorization (EUA). This EUA will remain in effect (meaning this test can be used) for the duration of the COVID-19 declaration under Section 564(b)(1) of the Act, 21 U.S.C. section 360bbb-3(b)(1), unless the authorization is terminated or revoked.  Performed at [facility], [address] [place], [place] [address]   Blood culture (single)     Status: None (Preliminary result)   Collection Time: 08/30/20  9:09 AM   Specimen: BLOOD  Result Value Ref Range Status   Specimen Description BLOOD LEFT ANTECUBITAL  Final   Special Requests   Final    BOTTLES DRAWN AEROBIC AND ANAEROBIC Blood Culture results may  not be optimal due to an excessive volume of blood received in culture bottles   Culture   Final  NO GROWTH 4 DAYS Performed at [facility], [address] [place], [place] [address]    Report Status PENDING  Incomplete    Procedures and diagnostic studies:  CT ABDOMEN PELVIS W CONTRAST  Result Date: 09/05/2020 CLINICAL DATA:  Postop day 6 from right colectomy with leukocytosis. Rule out infection. 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 EXAM: CT ABDOMEN AND PELVIS WITH CONTRAST TECHNIQUE: Multidetector CT imaging of the abdomen and pelvis was performed using the standard protocol following bolus administration of intravenous contrast. CONTRAST:  173mL OMNIPAQUE IOHEXOL 300 MG/ML  SOLN COMPARISON:  08/30/2020 FINDINGS: Lower chest: Clear lung bases. Normal heart size without pericardial or pleural effusion. Right coronary artery calcification. Hepatobiliary: Normal liver. Normal gallbladder, without biliary ductal dilatation. Pancreas: Normal, without mass or ductal dilatation. Spleen: Normal in size, without focal abnormality. Adrenals/Urinary Tract: Normal adrenal glands. Left renal too small to characterize lesions. Normal right kidney, without hydronephrosis. The bladder is decompressed. Stomach/Bowel: Normal stomach, without wall thickening. Status post right hemicolectomy. No fluid collection at the operative site. Mid small bowel loops measure up to the 3.2 cm, without focal transition identified. Vascular/Lymphatic: Aortic atherosclerosis. 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 No abdominopelvic adenopathy. Reproductive: Mild prostatomegaly. Other: Surgical drain terminates in the left hemipelvis on 66/2. No free fluid or significant abdominal ascites. There is a small amount of primarily upper abdominal intraperitoneal air. No pneumatosis identified. Musculoskeletal: Presumed sebaceous cyst about the inferior left flank at 4.6 cm. Lumbosacral spondylosis. IMPRESSION: 1. Status post  right hemicolectomy. Mild mid small bowel dilatation, without transition point. Favor postoperative adynamic ileus. No specific explanation for elevated white blood cell count. 2. Intraperitoneal air, somewhat prominent for postop day 6. No other cause identified. 3. Coronary artery atherosclerosis. Aortic Atherosclerosis (ICD10-I70.0). Electronically Signed   By: Abigail Miyamoto M.D.   On: 09/05/2020 14:44               LOS: 7 days   Ronna Herskowitz  [facility]   Pager on [email or web address]. If 7PM-7AM, please contact night-coverage at [email or web address]     09/06/2020, 3:40 PM

## 2020-09-07 LAB — CBC WITH DIFFERENTIAL/PLATELET
Abs Immature Granulocytes: 0.11 10*3/uL — ABNORMAL HIGH (ref 0.00–0.07)
Basophils Absolute: 0 10*3/uL (ref 0.0–0.1)
Basophils Relative: 0 %
Eosinophils Absolute: 0.5 10*3/uL (ref 0.0–0.5)
Eosinophils Relative: 4 %
HCT: 32.3 % — ABNORMAL LOW (ref 39.0–52.0)
Hemoglobin: 11.5 g/dL — ABNORMAL LOW (ref 13.0–17.0)
Immature Granulocytes: 1 %
Lymphocytes Relative: 13 %
Lymphs Abs: 1.6 10*3/uL (ref 0.7–4.0)
MCH: 33.4 pg (ref 26.0–34.0)
MCHC: 35.6 g/dL (ref 30.0–36.0)
MCV: 93.9 fL (ref 80.0–100.0)
Monocytes Absolute: 1 10*3/uL (ref 0.1–1.0)
Monocytes Relative: 8 %
Neutro Abs: 8.9 10*3/uL — ABNORMAL HIGH (ref 1.7–7.7)
Neutrophils Relative %: 74 %
Platelets: 342 10*3/uL (ref 150–400)
RBC: 3.44 MIL/uL — ABNORMAL LOW (ref 4.22–5.81)
RDW: 12.6 % (ref 11.5–15.5)
WBC: 12.1 10*3/uL — ABNORMAL HIGH (ref 4.0–10.5)
nRBC: 0 % (ref 0.0–0.2)

## 2020-09-07 LAB — BASIC METABOLIC PANEL
Anion gap: 10 (ref 5–15)
BUN: 24 mg/dL — ABNORMAL HIGH (ref 6–20)
CO2: 27 mmol/L (ref 22–32)
Calcium: 7.8 mg/dL — ABNORMAL LOW (ref 8.9–10.3)
Chloride: 91 mmol/L — ABNORMAL LOW (ref 98–111)
Creatinine, Ser: 0.71 mg/dL (ref 0.61–1.24)
GFR, Estimated: >60 mL/min (ref >60)
Glucose, Bld: 121 mg/dL — ABNORMAL HIGH (ref 70–99)
Potassium: 3.1 mmol/L — ABNORMAL LOW (ref 3.5–5.1)
Sodium: 128 mmol/L — ABNORMAL LOW (ref 135–145)

## 2020-09-07 LAB — MAGNESIUM: Magnesium: 2.1 mg/dL (ref 1.7–2.4)

## 2020-09-07 LAB — PHOSPHORUS: Phosphorus: 2.9 mg/dL (ref 2.5–4.6)

## 2020-09-07 MED ORDER — POTASSIUM CHLORIDE CRYS ER 20 MEQ PO TBCR: 40.0000 meq | EXTENDED_RELEASE_TABLET | Freq: Two times a day (BID) | ORAL | Status: DC

## 2020-09-07 MED ORDER — DILTIAZEM HCL ER COATED BEADS 180 MG PO CP24: 180.0000 mg | ORAL_CAPSULE | Freq: Every day | ORAL | 6 refills | Status: DC

## 2020-09-07 MED ORDER — HYDROCODONE-ACETAMINOPHEN 5-325 MG PO TABS: 1.0000 | ORAL_TABLET | ORAL | 0 refills | Status: AC | PRN

## 2020-09-07 MED ORDER — APIXABAN 5 MG PO TABS: 5.0000 mg | ORAL_TABLET | Freq: Two times a day (BID) | ORAL | 6 refills | Status: DC

## 2020-09-07 MED ORDER — POTASSIUM CHLORIDE CRYS ER 20 MEQ PO TBCR
40.0000 meq | EXTENDED_RELEASE_TABLET | ORAL | Status: AC
  Administered 2020-09-07 (×2): 40 meq via ORAL
  Filled 2020-09-07 (×2): qty 2

## 2020-09-07 MED ORDER — METOPROLOL TARTRATE 50 MG PO TABS: 50.0000 mg | ORAL_TABLET | Freq: Two times a day (BID) | ORAL | 6 refills | Status: DC

## 2020-09-07 NOTE — Discharge Summary (Addendum)
Patient ID: Henry Hill MRN: [id number] DOB/AGE: 10-19-1960 60 y.o.  Admit date: 08/30/2020 Discharge date: 09/07/2020   Discharge Diagnoses:  Active Problems:   Essential (primary) hypertension   Cecal volvulus ([facility])   Atrial fibrillation with RVR ([facility])   AKI (acute kidney injury) ([place])   Procedures: Exploratory laparotomy with right colectomy  Hospital Course: Patient with emergent exploratory laparotomy with right colectomy due to cecal volvulus.  Patient developed A. fib after surgery which was controlled medically.  Patient has been recovering slowly but adequately.  Patient received TPN initially for nutrition.  Patient started on diet and has been advancing adequately tolerating.  Patient having bowel movement.  Patient ambulating.  Patient with pain control.  Drain with adequate output.  Wound dry and clean.  Patient evaluated by cardiology and recommendations given.  Patient developed acute kidney injury from contrast that was resolved with IV hydration.  Physical Exam Vitals reviewed.  Constitutional:      Appearance: He is well-developed.  HENT:     Head: Normocephalic.  Cardiovascular:     Rate and Rhythm: Normal rate and regular rhythm.  Pulmonary:     Effort: Pulmonary effort is normal.     Breath sounds: Normal breath sounds.  Abdominal:     General: Abdomen is flat. Bowel sounds are normal.     Palpations: Abdomen is soft.  Skin:    General: Skin is warm.  Neurological:     Mental Status: He is alert and oriented to person, place, and time.   Wound is dry and clean.  Drain in place.   Consults: Hospitalist, Cardiology  Disposition: Discharge disposition: 01-Home or Self Care       Discharge Instructions    Diet - low sodium heart healthy   Complete by: As directed    Increase activity slowly   Complete by: As directed      Allergies as of 09/07/2020   No Known Allergies     Medication List    TAKE these medications   acetaminophen 500 MG  tablet Commonly known as: TYLENOL Take 1,000 mg by mouth every 6 (six) hours as needed for mild pain, moderate pain or headache.   aspirin EC 81 MG tablet Take 81 mg by mouth daily.   DuoDERM CGF Dressing Misc Apply patch to ulcer on left ankle and change once a week.   HYDROcodone-acetaminophen 5-325 MG tablet Commonly known as: Norco Take 1 tablet by mouth every 4 (four) hours as needed for up to 3 days for moderate pain.   MULTIVITAMIN ADULT PO Take 1 tablet by mouth daily.   NIFEdipine 60 MG 24 hr tablet Commonly known as: ADALAT CC TAKE 1 TABLET BY MOUTH EVERY DAY   Regranex 0.01 % gel Generic drug: becaplermin Apply 1 application topically daily.   Santyl ointment Generic drug: collagenase Apply 1 application topically daily.   sildenafil 50 MG tablet Commonly known as: VIAGRA TAKE 1 TABLET BY MOUTH DAILY AS NEEDED FOR ERECTILE DYSFUNCTION       Follow-up Information    [name], MD. Schedule an appointment as soon as possible for a visit on 09/12/2020.   Specialty: General Surgery Why: s/p explortory laparotomy  Contact information: [address] [place] [address] [phone]        Minna Merritts, MD Follow up in 2 week(s).   Specialty: Cardiology Why: we will contact you to arrange follow-up. Contact information: [place] [address] [phone]  This discharge encounter was more than 30 minutes most of the time counseling the patient and coordinating plan of care.

## 2020-09-07 NOTE — Progress Notes (Signed)
Progress Note  Patient Name: Henry Hill Date of Encounter: 09/07/2020  Primary Cardiologist: [name], MD  Subjective   Mild abd discomfort w/ BMs, otw stable.  Maintaining sinus rhythm.  Inpatient Medications    Scheduled Meds: . apixaban  5 mg Oral BID  . Chlorhexidine Gluconate Cloth  6 each Topical Daily  . diltiazem  180 mg Oral Daily  . feeding supplement  237 mL Oral BID BM  . insulin aspart  0-9 Units Subcutaneous Q4H  . metoprolol tartrate  50 mg Oral BID  . pantoprazole (PROTONIX) IV  40 mg Intravenous QHS  . potassium chloride  40 mEq Oral Q4H  . sodium chloride flush  10-40 mL Intracatheter Q12H   Continuous Infusions:  PRN Meds: HYDROmorphone (DILAUDID) injection, menthol-cetylpyridinium, metoprolol tartrate, ondansetron **OR** ondansetron (ZOFRAN) IV, oxyCODONE, phenol, polyethylene glycol, sodium chloride flush   Vital Signs    Vitals:   09/06/20 1521 09/06/20 2000 09/07/20 0443 09/07/20 0801  BP: 119/79 120/82 119/81 126/79  Pulse: 72 79 81 84  Resp: 17 20 16 18   Temp: 97.8 F (36.6 C) 97.7 F (36.5 C) 97.9 F (36.6 C) 98.3 F (36.8 C)  TempSrc: Oral Oral Oral Oral  SpO2: 97% 97% 98% 95%  Weight:      Height:        Intake/Output Summary (Last 24 hours) at 09/07/2020 0858 Last data filed at 09/06/2020 2132 Gross per 24 hour  Intake 310 ml  Output 270 ml  Net 40 ml   Filed Weights   09/02/20 0700 09/03/20 1141 09/04/20 1547  Weight: 81.6 kg 80.7 kg 80.6 kg    Physical Exam   GEN: Well nourished, well developed, in no acute distress.  HEENT: Grossly normal.  Neck: Supple, no JVD, carotid bruits, or masses. Cardiac: RRR, no murmurs, rubs, or gallops. No clubbing, cyanosis, edema.  Radials 2+, DP/PT 1+ and equal bilaterally.  Respiratory:  Respirations regular and unlabored, diminished breath sounds bilat. GI: Soft, mild, diffuse, tenderness.  Nondistended, BS + x 4.  Incision/drain w/o erythema. MS: no deformity or  atrophy. Skin: warm and dry, no rash. Neuro:  Strength and sensation are intact. Psych: AAOx3.  Normal affect.  Labs    Chemistry Recent Labs  Lab 09/01/20 0618 09/02/20 0710 09/05/20 0523 09/06/20 0455 09/07/20 0527  NA 132*   < > 132* 127* 128*  K 3.4*   < > 4.3 4.2 3.1*  CL 101   < > 103 97* 91*  CO2 25   < > 21* 17* 27  GLUCOSE 109*   < > 93 108* 121*  BUN 19   < > 12 38* 24*  CREATININE 0.60*   < > 0.53* 1.68* 0.71  CALCIUM 7.6*   < > 8.5* 8.4* 7.8*  PROT 5.5*  --   --   --   --   ALBUMIN 2.7*  --   --   --   --   AST 19  --   --   --   --   ALT 28  --   --   --   --   ALKPHOS 27*  --   --   --   --   BILITOT 0.8  --   --   --   --   GFRNONAA >60   < > >60 46* >60  ANIONGAP 6   < > 8 13 10    < > = values in this interval not displayed.  Hematology Recent Labs  Lab 09/05/20 0523 09/06/20 0455 09/07/20 0527  WBC 14.9* 16.7* 12.1*  RBC 3.83* 3.95* 3.44*  HGB 12.7* 13.1 11.5*  HCT 37.0* 37.2* 32.3*  MCV 96.6 94.2 93.9  MCH 33.2 33.2 33.4  MCHC 34.3 35.2 35.6  RDW 12.7 12.8 12.6  PLT 383 374 342    Cardiac Enzymes  Recent Labs  Lab 08/30/20 0907 08/30/20 1206 08/30/20 2110  TROPONINIHS 33* 36* 20*      BNP Recent Labs  Lab 09/03/20 0649  BNP 436.6*    Lipids  Lab Results  Component Value Date   CHOL 151 07/03/2019   HDL 100 07/03/2019   LDLCALC 42 07/03/2019   TRIG 48 09/01/2020   CHOLHDL 1.5 07/03/2019    HbA1c  Lab Results  Component Value Date   HGBA1C 5.4 08/31/2020    Radiology    CT ABDOMEN PELVIS W CONTRAST  Result Date: 09/05/2020 CLINICAL DATA:  Postop day 6 from right colectomy with leukocytosis. Rule out infection. 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 EXAM: CT ABDOMEN AND PELVIS WITH CONTRAST TECHNIQUE: Multidetector CT imaging of the abdomen and pelvis was performed using the standard protocol following bolus administration of intravenous contrast. CONTRAST:  160mL OMNIPAQUE IOHEXOL 300 MG/ML  SOLN COMPARISON:   08/30/2020 FINDINGS: Lower chest: Clear lung bases. Normal heart size without pericardial or pleural effusion. Right coronary artery calcification. Hepatobiliary: Normal liver. Normal gallbladder, without biliary ductal dilatation. Pancreas: Normal, without mass or ductal dilatation. Spleen: Normal in size, without focal abnormality. Adrenals/Urinary Tract: Normal adrenal glands. Left renal too small to characterize lesions. Normal right kidney, without hydronephrosis. The bladder is decompressed. Stomach/Bowel: Normal stomach, without wall thickening. Status post right hemicolectomy. No fluid collection at the operative site. Mid small bowel loops measure up to the 3.2 cm, without focal transition identified. Vascular/Lymphatic: Aortic atherosclerosis. 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 No abdominopelvic adenopathy. Reproductive: Mild prostatomegaly. Other: Surgical drain terminates in the left hemipelvis on 66/2. No free fluid or significant abdominal ascites. There is a small amount of primarily upper abdominal intraperitoneal air. No pneumatosis identified. Musculoskeletal: Presumed sebaceous cyst about the inferior left flank at 4.6 cm. Lumbosacral spondylosis. IMPRESSION: 1. Status post right hemicolectomy. Mild mid small bowel dilatation, without transition point. Favor postoperative adynamic ileus. No specific explanation for elevated white blood cell count. 2. Intraperitoneal air, somewhat prominent for postop day 6. No other cause identified. 3. Coronary artery atherosclerosis. Aortic Atherosclerosis (ICD10-I70.0). Electronically Signed   By: Abigail Miyamoto M.D.   On: 09/05/2020 14:44    Telemetry    RSR, 70-80, 4 beats NSVT - Personally Reviewed  Cardiac Studies   2D Echocardiogram4.24.22  1. Left ventricular ejection fraction, by estimation, is 50 to 55%. The  left ventricle has low normal function. The left ventricle has no regional  wall motion abnormalities. There is mild concentric  left ventricular  hypertrophy. Left ventricular  diastolic parameters are consistent with Grade I diastolic dysfunction  (impaired relaxation).  2. Right ventricular systolic function is normal. The right ventricular  size is normal.  3. The mitral valve is normal in structure. Trivial mitral valve  regurgitation. No evidence of mitral stenosis.  4. The aortic valve is normal in structure. Aortic valve regurgitation is  not visualized. No aortic stenosis is present.  5. The inferior vena cava is normal in size with greater than 50%  respiratory variability, suggesting right atrial pressure of 3 mmHg.   Patient Profile     60 y.o.malewith a history  of HTN, Tob abuse, ETOH use, and varicose veins,who was admitted 4/23 for bowel obstruction/cecal volvulus s/p ex lap and R colectomy, who developed Afib RVR post-op.  Assessment & Plan    1.  Bowel obstruction/cecalvolvulus:  S/p ex lap and R colectomy on 4/23.  Feels well this AM.  Still some loose stools, but improved.  Mild abd discomfort in setting of BM.  Per surgery.  2.  Afib RVR:  Converted to sinus on 4/29 @ 0736.  Maintaining sinus rhythm since.  Rates 70s to 63s.  Cont dilt 180 (dose prev reduced due to soft BPs) and metoprolol 50 bid.  CHA2DS2VASc = 1-2 (HTN, Ao atherosclerosis noted on CT).  Cont eliquis. Will arrange outpt f/u.  3.  Elevated HsTrop/Demand ischemia:  Mild HsTrop elevation w/ flat trend in the setting of #1 - 33 3620. ECGs w/ variable degrees of ST depression in the setting of LVH and tachycardia. He denies any h/o chest pain or dyspnea. This does not represent ACS. Suspect demand ischemia. Reassured by echo - EF 50-55% w/o rwma. CT abd did show Ao atherosclerosis, though lipid panel from last year impressive w/ HDL of 100 and LDL of 42 (statin naive). Will plan on outpt myoview for risk stratification.  4. Essential HTN:  Stable on ? blocker and dilt.  5.  AKI:  Present on admission. Initially  resolved, however creat up to 1.68 4/30  improved w/ hydration, now 0.71.  Likely 2/2 contrast/diarrhea.    6.  Tob/alcohol abuse:  Cessation advised.    7.  ? OSA:  Snores loudly per wife.  Will plan outpt sleep eval.  8.  Claudication:  When discussing outpt stress testing, he notes that he experiences  bilat calf claudication over the past several years - occurs @ variable distances/paces.  Weak distal pulses.  Will plan outpt ABI.  9.  NSVT:  4 beats noted on tele.  Asymptomatic.  Nl EF encouraging.  Cont  blocker.  Outpt myoview.  Signed, Murray Hodgkins, NP  09/07/2020, 8:58 AM    For questions or updates, please contact   Please consult [email or web address] for contact info under Cardiology/STEMI.

## 2020-09-07 NOTE — Consult Note (Signed)
[facility] for Electrolyte Monitoring and Replacement   Recent Labs: Potassium (mmol/L)  Date Value  09/07/2020 3.1 (L)   Magnesium (mg/dL)  Date Value  09/07/2020 2.1   Calcium (mg/dL)  Date Value  09/07/2020 7.8 (L)   Albumin (g/dL)  Date Value  09/01/2020 2.7 (L)  07/03/2019 4.5   Phosphorus (mg/dL)  Date Value  09/07/2020 2.9   Sodium (mmol/L)  Date Value  09/07/2020 128 (L)  07/03/2019 130 (L)   Assessment: 60 y.o.malepresenting with a 5 day history of nausea, vomiting and 3 day history of worsening abdominal pain. He has been unable to keep solid po intake, but was able to drink some fluids. However, given the worsening pain, he presented today to the ER for evaluation. In the ER, he has remained tachycardic, with abdominal distention. Pt underwent (4/23) ex laparotomy & rt colectomy post-op c/b ileus and Afib. Pt initially managed with TPN (4/23-28) until ileus resolved and pt is advancing diet to soft foods. Afib req'd diltiazem gtt (4/23-27) to maintain rate now transitioned to PO dilt. Pharmacy has been consulted for electrolyte management.  On soft diet as of 4/28  MIVF: Isotonic bicarbonate drip at 125 mL/hr  Goal of Therapy:  Lytes WNL  Plan:  --K 3.1, potassium 40 mEq x 2 doses --No further electrolyte replacement warranted at this time --Follow-up electrolytes with AM labs  Benita Gutter 09/07/2020 7:30 AM

## 2020-09-07 NOTE — Plan of Care (Signed)

## 2020-09-12 ENCOUNTER — Other Ambulatory Visit: Payer: Self-pay

## 2020-09-12 ENCOUNTER — Telehealth: Payer: Self-pay | Admitting: *Deleted

## 2020-09-12 ENCOUNTER — Ambulatory Visit (INDEPENDENT_AMBULATORY_CARE_PROVIDER_SITE_OTHER): Payer: 59 | Admitting: Surgery

## 2020-09-12 ENCOUNTER — Encounter: Payer: Self-pay | Admitting: Surgery

## 2020-09-12 VITALS — BP 137/88 | HR 80 | Temp 98.3°F | Ht 72.0 in | Wt 165.6 lb

## 2020-09-12 DIAGNOSIS — Z09 Encounter for follow-up examination after completed treatment for conditions other than malignant neoplasm: Secondary | ICD-10-CM

## 2020-09-12 DIAGNOSIS — Z9049 Acquired absence of other specified parts of digestive tract: Secondary | ICD-10-CM

## 2020-09-12 DIAGNOSIS — K562 Volvulus: Secondary | ICD-10-CM

## 2020-09-12 NOTE — Patient Instructions (Addendum)
A referral to GI has been placed to get you scheduled for a colonoscopy. They will call you for an appointment. See follow up appointment below.  If you have any concerns or questions, please feel free to call our office.     Protein Content in Foods Protein is a necessary nutrient in any diet. It helps build and repair muscles, bones, and skin. Depending on your overall health, you may need more or less protein in your diet. You are encouraged to eat a variety of protein foods to ensure that you get all the essential nutrients that are found in different protein foods. Talk to your health care provider or dietitian about how much protein you need each day and which sources of protein are best for you. Protein is especially important for:  Repairing and making cells and tissues.  Fighting infection.  Energy.  Growth and development. See the following list for the protein content of some common foods. What are tips for getting more protein in your diet?  Try to replace processed carbohydrates with high-quality protein.  Snack on nuts and seeds instead of chips.  Replace baked desserts with [place] yogurt.  Eat protein foods from both plant and animal sources.  Replace red meat with seafood choices. What foods are high in proteins? High-protein foods contain 4 grams (g) or more of protein per serving. They include: Meat protein  Beef, ground sirloin (cooked) -- 3 oz (85 g) have 24 g of protein.  Chicken breast, boneless and skinless (cooked) -- 3 oz (85 g) have 25 g of protein.  Egg -- 1 egg has 6 g of protein.  Fish, filet (cooked) -- 1 oz (28 g) has 6-7 g of protein.  Lamb (cooked) -- 3 oz (85 g) has 24 g of protein.  Pork tenderloin (cooked) -- 3 oz (85 g) has 23 g of protein.  Tuna (canned in water) -- 3 oz (85 g) has 20 g of protein. Dairy  Cottage cheese -- 1/2 cup (114 g) has 13.4 g of protein.  Milk -- 1 cup (250 mL) has 8 g of protein.  Cheese (hard) -- 1 oz  (28 g) has 7 g of protein.  Yogurt, regular -- 6 oz (170 g) has 8 g of protein.  Greek yogurt -- 6 oz (200 g) has 18 g protein. Plant protein  Garbanzo beans (canned or cooked) -- 1/2 cup (130 g) has 6-7 g of protein.  Kidney beans (canned or cooked) -- 1/2 cup (130 g) has 6-7 g of protein.  Nuts (peanuts, pistachios, almonds) -- 1 oz (28 g) has 6 g of protein.  Peanut butter -- 1 oz (32 g) has 7-8 g of protein.  Pumpkin seeds -- 1 oz (28 g) has 8.5 g of protein.  Soybeans (roasted) -- 1 oz (28 g) has 8 g of protein.  Soybeans (cooked) -- 1/2 cup (90 g) has 11 g of protein.  Soy milk -- 1 cup (250 mL) has 5-10 g of protein.  Soy or vegetable patty -- 1 patty has 11 g of protein.  Sunflower seeds -- 1 oz (28 g) has 5.5 g of protein.  Tofu (firm) -- 1/2 cup (124 g) has 20 g of protein.  Tempeh -- 1/2 cup (83 g) has 16 g of protein. The items listed above may not be a complete list of foods high in protein. Actual amounts of protein may differ depending on processing. Contact a dietitian for more information.   What  foods are low in protein? Low-protein foods contain 3 grams (g) or less of protein per serving. They include: Fruits  Fruit or vegetable juice -- 1/2 cup (125 mL) has 1 g of protein. Vegetables  Beets (raw or cooked) -- 1/2 cup (68 g) has 1.5 g of protein.  Broccoli (raw or cooked) -- 1/2 cup (44 g) has 2 g of protein.  Collard greens (raw or cooked) -- 1/2 cup (42 g) has 2 g of protein.  Green beans (raw or cooked) -- 1/2 cup (83 g) has 1 g of protein.  Green peas (canned) -- 1/2 cup (80 g) has 3.5 g of protein.  Potato (baked with skin) -- 1 medium potato (173 g) has 3 g of protein.  Spinach (cooked) -- 1/2 cup (90 g) has 3 g of protein.  Squash (cooked) -- 1/2 cup (90 g) has 1.5 g of protein.  Avocado -- 1 cup (146 g) has 2.7 g of protein. Grains  Bran cereal -- 1/2 cup (30 g) has 2-3 g of protein.  Bread -- 1 slice has 2.5 g of protein.  Corn  (fresh or cooked) -- 1/2 cup (77 g) has 2 g of protein.  Flour tortilla -- One 6-inch (15 cm) tortilla has 2.5 g of protein.  Muffins -- 1 small muffin (2 oz or 57 g) has 3 g of protein.  Oatmeal (cooked) -- 1/2 cup (40 g) has 3 g of protein.  Rice (cooked) -- 1/2 cup (79 g) has 2.5-3.5 g of protein. Dairy  Cream cheese -- 1 oz (29 g) has 2 g of protein.  Creamer (half-and-half) -- 1 oz (29 mL) has 1 g of protein.  Frozen yogurt -- 1/2 cup (72 g) has 3 g of protein.  Sour cream -- 1/2 cup (75 g) has 2.5 g of protein. The items listed above may not be a complete list of foods low in protein. Actual amounts of protein may differ depending on processing. Contact a dietitian for more information.   Summary  Protein is a nutrient that your body needs for growth and development, repairing and making cells and tissues, fighting infection, and providing energy.  Protein is in both plant and animal foods. Some of these foods have more protein than others.  Depending on your overall health, you may need more or less protein in your diet. Talk to your health care provider about how much protein you need. This information is not intended to replace advice given to you by your health care provider. Make sure you discuss any questions you have with your health care provider. Document Revised: 04/25/2019 Document Reviewed: 04/25/2019 Elsevier Patient Education  2021 [employer].

## 2020-09-12 NOTE — Telephone Encounter (Signed)
Fax FMLA to [employer] at [phone]

## 2020-09-12 NOTE — Progress Notes (Signed)
09/12/2020  HPI: Henry Hill is a 60 y.o. 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  Presents today for follow up.  Reports doing well.  Eating well, having bowel function, and with minimal discomfort at the incision.  Drain is having still high output > 50 per day of serous fluid  Vital signs: BP 137/88   Pulse 80   Temp 98.3 F (36.8 C) (Oral)   Ht 6' (1.829 m)   Wt 165 lb 9.6 oz (75.1 kg)   BMI 22.46 kg/m    Physical Exam: Constitutional:  No acute distress Abdomen:  Soft, non-distended, non-tender.  Midline with staples, healing well, no evidence of breakdown or infection.  Drain in RLQ with serous fluid.  Assessment/Plan: This is a 60 y.o. male s/p exlap and right colectomy  --Discussed with patient again pathology results and that he would need a colonoscopy in two months to make sure there are no other polyps in his colon. --Staples removed and changed to steri strips. --Patient may shower. --Drain will remain in place for now given the higher output.  He'll follow up next week for drain removal.   Melvyn Neth, MD [name]

## 2020-09-15 ENCOUNTER — Other Ambulatory Visit: Payer: Self-pay

## 2020-09-15 ENCOUNTER — Encounter: Payer: 59 | Attending: Physician Assistant | Admitting: Physician Assistant

## 2020-09-15 DIAGNOSIS — I10 Essential (primary) hypertension: Secondary | ICD-10-CM | POA: Diagnosis not present

## 2020-09-15 DIAGNOSIS — I480 Paroxysmal atrial fibrillation: Secondary | ICD-10-CM | POA: Insufficient documentation

## 2020-09-15 DIAGNOSIS — Z7901 Long term (current) use of anticoagulants: Secondary | ICD-10-CM | POA: Insufficient documentation

## 2020-09-15 DIAGNOSIS — L97828 Non-pressure chronic ulcer of other part of left lower leg with other specified severity: Secondary | ICD-10-CM | POA: Diagnosis present

## 2020-09-15 DIAGNOSIS — I872 Venous insufficiency (chronic) (peripheral): Secondary | ICD-10-CM | POA: Insufficient documentation

## 2020-09-15 NOTE — Progress Notes (Signed)
Henry Hill, Henry Hill ([id number]) Visit Report for 09/15/2020 Allergy List Details Patient Name: Henry Hill, Henry Hill. Date of Service: 09/15/2020 2:30 PM Medical Record Number: [id number] Patient Account Number: [id number] Date of Birth/Sex: 03/16/1961 (60 y.o. Male) Treating RN: Dolan Amen Primary Care Shenita Trego: Vernie Murders Other Clinician: Referring Corvette Orser: Vernie Murders Treating Holland Kotter/Extender: Jeri Cos Weeks in Treatment: 0 Allergies Active Allergies No Known Allergies Allergy Notes Electronic Signature(s) Signed: 09/15/2020 5:15:51 PM By: Georges Mouse, Minus Breeding RN Entered By: Georges Mouse, Minus Breeding on 09/15/2020 15:08:25 Henry Hill ([id number]) -------------------------------------------------------------------------------- Arrival Information Details Patient Name: Henry Hill, Henry Hill. Date of Service: 09/15/2020 2:30 PM Medical Record Number: [id number] Patient Account Number: [id number] Date of Birth/Sex: [birth year] (60 y.o. Male) Treating RN: Dolan Amen Primary Care Sofhia Ulibarri: Vernie Murders Other Clinician: Referring Hatim Homann: Vernie Murders Treating Krishan Mcbreen/Extender: Skipper Cliche in Treatment: 0 Visit Information Patient Arrived: Ambulatory Arrival Time: 15:01 Accompanied By: wife Transfer Assistance: None Patient Identification Verified: Yes Secondary Verification Process Completed: Yes Patient Has Alerts: Yes Patient Alerts: Patient on Blood Thinner ELIQUIS NOT A DIABETIC Electronic Signature(s) Signed: 09/15/2020 3:34:30 PM By: Charlett Nose RN Previous Signature: 09/15/2020 3:34:18 PM Version By: Georges Mouse, Minus Breeding RN Entered By: Georges Mouse, Minus Breeding on 09/15/2020 15:34:30 Henry Hill ([id number]) -------------------------------------------------------------------------------- Clinic Level of Care Assessment Details Patient Name: Henry Hill Date of Service: 09/15/2020 2:30 PM Medical Record Number: [id number] Patient Account  Number: [id number] Date of Birth/Sex: 1961-04-21 (60 y.o. Male) Treating RN: Donnamarie Poag Primary Care Jleigh Striplin: Vernie Murders Other Clinician: Referring Caleigh Rabelo: Vernie Murders Treating Analysia Dungee/Extender: Skipper Cliche in Treatment: 0 Clinic Level of Care Assessment Items TOOL 1 Quantity Score X - Use when EandM and Procedure is performed on INITIAL visit 1 0 ASSESSMENTS - Nursing Assessment / Reassessment X - General Physical Exam (combine w/ comprehensive assessment (listed just below) when performed on new 1 20 pt. evals) X- 1 25 Comprehensive Assessment (HX, ROS, Risk Assessments, Wounds Hx, etc.) ASSESSMENTS - Wound and Skin Assessment / Reassessment []  - Dermatologic / Skin Assessment (not related to wound area) 0 ASSESSMENTS - Ostomy and/or Continence Assessment and Care []  - Incontinence Assessment and Management 0 []  - 0 Ostomy Care Assessment and Management (repouching, etc.) PROCESS - Coordination of Care X - Simple Patient / Family Education for ongoing care 1 15 []  - 0 Complex (extensive) Patient / Family Education for ongoing care []  - 0 Staff obtains Programmer, systems, Records, Test Results / Process Orders []  - 0 Staff telephones HHA, Nursing Homes / Clarify orders / etc []  - 0 Routine Transfer to another Facility (non-emergent condition) []  - 0 Routine Hospital Admission (non-emergent condition) X- 1 15 New Admissions / Biomedical engineer / Ordering NPWT, Apligraf, etc. []  - 0 Emergency Hospital Admission (emergent condition) PROCESS - Special Needs []  - Pediatric / Minor Patient Management 0 []  - 0 Isolation Patient Management []  - 0 Hearing / Language / Visual special needs []  - 0 Assessment of Community assistance (transportation, D/C planning, etc.) []  - 0 Additional assistance / Altered mentation []  - 0 Support Surface(s) Assessment (bed, cushion, seat, etc.) INTERVENTIONS - Miscellaneous []  - External ear exam 0 []  - 0 Patient Transfer  (multiple staff / Civil Service fast streamer / Similar devices) []  - 0 Simple Staple / Suture removal (25 or less) []  - 0 Complex Staple / Suture removal (26 or more) []  - 0 Hypo/Hyperglycemic Management (do not check if billed separately) X- 1 15 Ankle / Brachial Index (ABI) - do not check if billed separately  Has the patient been seen at the hospital within the last three years: Yes Total Score: 90 Level Of Care: New/Established - Level 3 Henry Hill, Henry Hill ([id number]) Electronic Signature(s) Signed: 09/15/2020 5:00:28 PM By: Donnamarie Poag Entered By: Donnamarie Poag on 09/15/2020 16:07:09 Henry Hill ([id number]) -------------------------------------------------------------------------------- Encounter Discharge Information Details Patient Name: Henry Hill, Henry Hill. Date of Service: 09/15/2020 2:30 PM Medical Record Number: [id number] Patient Account Number: [id number] Date of Birth/Sex: 1960-05-18 (60 y.o. Male) Treating RN: Donnamarie Poag Primary Care Sunaina Ferrando: Vernie Murders Other Clinician: Referring Malory Spurr: Vernie Murders Treating Dusan Lipford/Extender: Skipper Cliche in Treatment: 0 Encounter Discharge Information Items Post Procedure Vitals Discharge Condition: Stable Temperature (F): 97.7 Ambulatory Status: Ambulatory Henry Hill (bpm): 72 Discharge Destination: Home Respiratory Rate (breaths/min): 16 Transportation: Private Auto Blood Pressure (mmHg): 152/89 Accompanied By: wife Schedule Follow-up Appointment: Yes Clinical Summary of Care: Electronic Signature(s) Signed: 09/15/2020 5:00:28 PM By: Donnamarie Poag Entered By: Donnamarie Poag on 09/15/2020 16:10:01 Henry Hill ([id number]) -------------------------------------------------------------------------------- Lower Extremity Assessment Details Patient Name: Henry Hill. Date of Service: 09/15/2020 2:30 PM Medical Record Number: [id number] Patient Account Number: [id number] Date of Birth/Sex: 11-16-1960 (60 y.o. Male) Treating RN: Dolan Amen Primary Care Kelley Polinsky: Vernie Murders Other Clinician: Referring Ruchama Kubicek: Vernie Murders Treating Eligha Kmetz/Extender: Skipper Cliche in Treatment: 0 Edema Assessment Assessed: [Left: Yes] [Right: Yes] Edema: [Left: No] [Right: No] Calf Left: Right: Point of Measurement: 31 cm From Medial Instep 32 cm Ankle Left: Right: Point of Measurement: 12 cm From Medial Instep 22.5 cm Knee To Floor Left: Right: From Medial Instep 40 cm Vascular Assessment Pulses: Dorsalis Pedis Palpable: [Left:Yes] [Right:Yes] Doppler Audible: [Left:Yes] Posterior Tibial Palpable: [Left:No] Doppler Audible: [Left:Inaudible] Blood Pressure: Brachial: [Left:122] Ankle: [Left:Dorsalis Pedis: 72 0.59] Electronic Signature(s) Signed: 09/15/2020 5:15:51 PM By: Georges Mouse, Minus Breeding RN Entered By: Georges Mouse, Minus Breeding on 09/15/2020 15:31:33 Henry Hill ([id number]) -------------------------------------------------------------------------------- Multi Wound Chart Details Patient Name: Henry Hill. Date of Service: 09/15/2020 2:30 PM Medical Record Number: [id number] Patient Account Number: [id number] Date of Birth/Sex: Oct 11, 1960 (60 y.o. Male) Treating RN: Donnamarie Poag Primary Care Nastassja Witkop: Vernie Murders Other Clinician: Referring Aravind Chrismer: Vernie Murders Treating Rebbecca Osuna/Extender: Skipper Cliche in Treatment: 0 Vital Signs Height(in): 72 Henry Hill(bpm): 72 Weight(lbs): 170 Blood Pressure(mmHg): 152/89 Body Mass Index(BMI): 23 Temperature(F): 97.7 Respiratory Rate(breaths/min): 16 Photos: [N/A:N/A] Wound Location: Left, Medial Lower Leg N/A N/A Wounding Event: Gradually Appeared N/A N/A Primary Etiology: Venous Leg Ulcer N/A N/A Comorbid History: Arrhythmia, Hypertension N/A N/A Date Acquired: 08/09/2019 N/A N/A Weeks of Treatment: 0 N/A N/A Wound Status: Open N/A N/A Measurements L x W x D (cm) 3x1.3x0.1 N/A N/A Area (cm) : 3.063 N/A N/A Volume (cm) : 0.306 N/A  N/A Classification: Unclassifiable N/A N/A Exudate Amount: Medium N/A N/A Exudate Type: Serous N/A N/A Exudate Color: amber N/A N/A Granulation Amount: None Present (0%) N/A N/A Necrotic Amount: Large (67-100%) N/A N/A Necrotic Tissue: Eschar N/A N/A Exposed Structures: Fascia: No N/A N/A Fat Layer (Subcutaneous Tissue): No Tendon: No Muscle: No Joint: No Bone: No Epithelialization: None N/A N/A Treatment Notes Electronic Signature(s) Signed: 09/15/2020 5:00:28 PM By: Donnamarie Poag Entered By: Donnamarie Poag on 09/15/2020 15:52:55 Henry Hill ([id number]) -------------------------------------------------------------------------------- [place] Details Patient Name: Henry Hill, Henry Hill. Date of Service: 09/15/2020 2:30 PM Medical Record Number: [id number] Patient Account Number: [id number] Date of Birth/Sex: 09-26-1960 (60 y.o. Male) Treating RN: Donnamarie Poag Primary Care Mahitha Hickling: Vernie Murders Other Clinician: Referring Kinesha Auten: Vernie Murders Treating Haskel Dewalt/Extender: Skipper Cliche in Treatment: 0 Active Inactive Orientation  to the Wound Care Program Nursing Diagnoses: Knowledge deficit related to the wound healing center program Goals: Patient/caregiver will verbalize understanding of the Wound Healing Center Program Date Initiated: 09/15/2020 Target Resolution Date: 09/15/2020 Goal Status: Active Interventions: Provide education on orientation to the wound center Notes: Wound/Skin Impairment Nursing Diagnoses: Impaired tissue integrity Knowledge deficit related to ulceration/compromised skin integrity Goals: Patient/caregiver will verbalize understanding of skin care regimen Date Initiated: 09/15/2020 Target Resolution Date: 10/17/2020 Goal Status: Active Ulcer/skin breakdown will have a volume reduction of 30% by week 4 Date Initiated: 09/15/2020 Target Resolution Date: 10/13/2020 Goal Status: Active Ulcer/skin breakdown will have a volume  reduction of 50% by week 8 Date Initiated: 09/15/2020 Target Resolution Date: 11/10/2020 Goal Status: Active Interventions: Assess patient/caregiver ability to obtain necessary supplies Assess patient/caregiver ability to perform ulcer/skin care regimen upon admission and as needed Assess ulceration(s) every visit Treatment Activities: Skin care regimen initiated : 09/15/2020 Notes: Electronic Signature(s) Signed: 09/15/2020 5:00:28 PM By: Hansel Feinstein Entered By: Hansel Feinstein on 09/15/2020 15:52:42 Cassar, Gunnar Fusi ([id number]) -------------------------------------------------------------------------------- Pain Assessment Details Patient Name: Henry Hill. Date of Service: 09/15/2020 2:30 PM Medical Record Number: [id number] Patient Account Number: [id number] Date of Birth/Sex: [birth year] (60 y.o. Male) Treating RN: Rogers Blocker Primary Care Jaishaun Mcnab: Dortha Kern Other Clinician: Referring Raden Byington: Dortha Kern Treating Amandalee Lacap/Extender: Rowan Blase in Treatment: 0 Active Problems Location of Pain Severity and Description of Pain Patient Has Paino No Site Locations Rate the pain. Current Pain Level: 0 Pain Management and Medication Current Pain Management: Electronic Signature(s) Signed: 09/15/2020 5:15:51 PM By: Phillis Haggis, Dondra Prader RN Entered By: Phillis Haggis, Dondra Prader on 09/15/2020 15:07:14 Henry Hill ([id number]) -------------------------------------------------------------------------------- Patient/Caregiver Education Details Patient Name: Henry Hill Date of Service: 09/15/2020 2:30 PM Medical Record Number: [id number] Patient Account Number: [id number] Date of Birth/Gender: [birth year] (60 y.o. Male) Treating RN: Hansel Feinstein Primary Care Physician: Dortha Kern Other Clinician: Referring Physician: Dortha Kern Treating Physician/Extender: Rowan Blase in Treatment: 0 Education Assessment Education Provided To: Patient Education  Topics Provided Basic Hygiene: Infection: Nutrition: Welcome To The Wound Care Center: Methods: Explain/Verbal Responses: State content correctly Wound/Skin Impairment: Methods: Demonstration, Explain/Verbal Responses: State content correctly Electronic Signature(s) Signed: 09/15/2020 5:00:28 PM By: Hansel Feinstein Entered By: Hansel Feinstein on 09/15/2020 16:08:55 Henry Hill ([id number]) -------------------------------------------------------------------------------- Wound Assessment Details Patient Name: Henry Hill. Date of Service: 09/15/2020 2:30 PM Medical Record Number: [id number] Patient Account Number: [id number] Date of Birth/Sex: 1960/11/29 (60 y.o. Male) Treating RN: Rogers Blocker Primary Care Rashel Okeefe: Dortha Kern Other Clinician: Referring Carolynne Schuchard: Dortha Kern Treating Donaciano Range/Extender: Rowan Blase in Treatment: 0 Wound Status Wound Number: 1 Primary Etiology: Venous Leg Ulcer Wound Location: Left, Medial Lower Leg Wound Status: Open Wounding Event: Gradually Appeared Comorbid History: Arrhythmia, Hypertension Date Acquired: 08/09/2019 Weeks Of Treatment: 0 Clustered Wound: No Photos Wound Measurements Length: (cm) 3 Width: (cm) 1.3 Depth: (cm) 0.1 Area: (cm) 3.063 Volume: (cm) 0.306 % Reduction in Area: 0% % Reduction in Volume: 0% Epithelialization: None Tunneling: No Undermining: No Wound Description Classification: Unclassifiable Exudate Amount: Medium Exudate Type: Serous Exudate Color: amber Foul Odor After Cleansing: No Slough/Fibrino No Wound Bed Granulation Amount: None Present (0%) Exposed Structure Necrotic Amount: Large (67-100%) Fascia Exposed: No Necrotic Quality: Eschar Fat Layer (Subcutaneous Tissue) Exposed: Yes Tendon Exposed: No Muscle Exposed: No Joint Exposed: No Bone Exposed: No Treatment Notes Wound #1 (Lower Leg) Wound Laterality: Left, Medial Cleanser Soap and Water Discharge Instruction: Gently  cleanse wound with antibacterial soap, rinse and pat  dry prior to dressing wounds Peri-Wound Care Henry Hill, Henry Hill ([id number]) Topical Primary Dressing Prisma 4.34 (in) Discharge Instruction: Moisten w/normal saline or sterile water; Cover wound as directed. Do not remove from wound bed. Secondary Dressing Mepilex Border Flex, 4x4 (in/in) Discharge Instruction: Apply to wound as directed. Do not cut. Secured With Compression Wrap Compression Stockings Add-Ons Electronic Signature(s) Signed: 09/15/2020 5:00:28 PM By: Donnamarie Poag Signed: 09/15/2020 5:15:51 PM By: Georges Mouse, Minus Breeding RN Entered By: Donnamarie Poag on 09/15/2020 [place], [place] ([id number]) -------------------------------------------------------------------------------- Vitals Details Patient Name: Henry Hill, Henry Hill. Date of Service: 09/15/2020 2:30 PM Medical Record Number: [id number] Patient Account Number: [id number] Date of Birth/Sex: Feb 19, 1961 (61 y.o. Male) Treating RN: Dolan Amen Primary Care Marialuisa Basara: Vernie Murders Other Clinician: Referring Heily Carlucci: Vernie Murders Treating Ariele Vidrio/Extender: Skipper Cliche in Treatment: 0 Vital Signs Time Taken: 15:00 Temperature (F): 97.7 Height (in): 72 Henry Hill (bpm): 72 Weight (lbs): 170 Respiratory Rate (breaths/min): 16 Body Mass Index (BMI): 23.1 Blood Pressure (mmHg): 152/89 Reference Range: 80 - 120 mg / dl Electronic Signature(s) Signed: 09/15/2020 5:15:51 PM By: Georges Mouse, Minus Breeding RN Entered By: Georges Mouse, Minus Breeding on 09/15/2020 15:07:53

## 2020-09-15 NOTE — Progress Notes (Signed)
Henry Hill ([id number]) Visit Report for 09/15/2020 Abuse/Suicide Risk Screen Details Patient Name: Henry Hill, Henry Hill. Date of Service: 09/15/2020 2:30 PM Medical Record Number: [id number] Patient Account Number: [id number] Date of Birth/Sex: 1960-08-11 (60 y.o. Male) Treating RN: Dolan Amen Primary Care Carlyle Achenbach: Vernie Murders Other Clinician: Referring Letha Mirabal: Vernie Murders Treating Danila Eddie/Extender: Skipper Cliche in Treatment: 0 Abuse/Suicide Risk Screen Items Answer ABUSE RISK SCREEN: Has anyone close to you tried to hurt or harm you recentlyo No Do you feel uncomfortable with anyone in your familyo No Has anyone forced you do things that you didnot want to doo No Electronic Signature(s) Signed: 09/15/2020 5:15:51 PM By: Georges Mouse, Minus Breeding RN Entered By: Georges Mouse, Minus Breeding on 09/15/2020 15:12:19 Henry Hill ([id number]) -------------------------------------------------------------------------------- Activities of Daily Living Details Patient Name: Henry Hill. Date of Service: 09/15/2020 2:30 PM Medical Record Number: [id number] Patient Account Number: [id number] Date of Birth/Sex: 11-14-1960 (60 y.o. Male) Treating RN: Dolan Amen Primary Care Jakarius Flamenco: Vernie Murders Other Clinician: Referring Myleigh Amara: Vernie Murders Treating Teofil Maniaci/Extender: Skipper Cliche in Treatment: 0 Activities of Daily Living Items Answer Activities of Daily Living (Please select one for each item) Drive Automobile Completely Able Take Medications Completely Able Use Telephone Completely Able Care for Appearance Completely Able Use Toilet Completely Able Bath / Shower Completely Able Dress Self Completely Able Feed Self Completely Able Walk Completely Able Get In / Out Bed Completely Able Housework Completely Able Prepare Meals Completely Able Handle Money Completely Able Shop for Self Completely Able Electronic Signature(s) Signed: 09/15/2020 5:15:51 PM  By: Georges Mouse, Minus Breeding RN Entered By: Georges Mouse, Minus Breeding on 09/15/2020 15:12:35 Henry Hill ([id number]) -------------------------------------------------------------------------------- Education Screening Details Patient Name: Henry Hill. Date of Service: 09/15/2020 2:30 PM Medical Record Number: [id number] Patient Account Number: [id number] Date of Birth/Sex: July 27, 1960 (60 y.o. Male) Treating RN: Dolan Amen Primary Care Tandrea Kommer: Vernie Murders Other Clinician: Referring Kaniah Rizzolo: Vernie Murders Treating Price Lachapelle/Extender: Skipper Cliche in Treatment: 0 Primary Learner Assessed: Patient Learning Preferences/Education Level/Primary Language Learning Preference: Explanation, Demonstration Highest Education Level: College or Above Preferred Language: English Cognitive Barrier Language Barrier: No Translator Needed: No Memory Deficit: No Emotional Barrier: No Cultural/Religious Beliefs Affecting Medical Care: No Physical Barrier Impaired Vision: No Impaired Hearing: No Decreased Hand dexterity: No Knowledge/Comprehension Knowledge Level: Medium Comprehension Level: Medium Ability to understand written instructions: Medium Ability to understand verbal instructions: Medium Motivation Anxiety Level: Calm Cooperation: Cooperative Education Importance: Acknowledges Need Interest in Health Problems: Asks Questions Perception: Coherent Willingness to Engage in Self-Management High Activities: Readiness to Engage in Self-Management High Activities: Electronic Signature(s) Signed: 09/15/2020 5:15:51 PM By: Georges Mouse, Minus Breeding RN Entered By: Georges Mouse, Minus Breeding on 09/15/2020 15:12:58 Henry Hill ([id number]) -------------------------------------------------------------------------------- Fall Risk Assessment Details Patient Name: Henry Hill. Date of Service: 09/15/2020 2:30 PM Medical Record Number: [id number] Patient Account Number:  [id number] Date of Birth/Sex: 1960-11-20 (60 y.o. Male) Treating RN: Dolan Amen Primary Care Verda Mehta: Vernie Murders Other Clinician: Referring Rosann Gorum: Vernie Murders Treating Maurita Havener/Extender: Skipper Cliche in Treatment: 0 Fall Risk Assessment Items Have you had 2 or more falls in the last 12 monthso 0 No Have you had any fall that resulted in injury in the last 12 monthso 0 No FALLS RISK SCREEN History of falling - immediate or within 3 months 0 No Secondary diagnosis (Do you have 2 or more medical diagnoseso) 15 Yes Ambulatory aid None/bed rest/wheelchair/nurse 0 Yes Crutches/cane/walker 0 No Furniture 0 No Intravenous therapy Access/Saline/Heparin Lock 0 No Gait/Transferring Normal/ bed rest/  wheelchair 0 Yes Weak (short steps with or without shuffle, stooped but able to lift head while walking, may 0 No seek support from furniture) Impaired (short steps with shuffle, may have difficulty arising from chair, head down, impaired 0 No balance) Mental Status Oriented to own ability 0 Yes Electronic Signature(s) Signed: 09/15/2020 5:15:51 PM By: Georges Mouse, Minus Breeding RN Entered By: Georges Mouse, Kenia on 09/15/2020 15:13:14 Henry Hill ([id number]) -------------------------------------------------------------------------------- Foot Assessment Details Patient Name: Henry Hill. Date of Service: 09/15/2020 2:30 PM Medical Record Number: [id number] Patient Account Number: [id number] Date of Birth/Sex: 03-09-1961 (60 y.o. Male) Treating RN: Dolan Amen Primary Care Argie Applegate: Vernie Murders Other Clinician: Referring Elisandra Deshmukh: Vernie Murders Treating Makenze Ellett/Extender: Skipper Cliche in Treatment: 0 Foot Assessment Items Site Locations + = Sensation present, - = Sensation absent, C = Callus, U = Ulcer R = Redness, W = Warmth, M = Maceration, PU = Pre-ulcerative lesion F = Fissure, S = Swelling, D = Dryness Assessment Right: Left: Other  Deformity: No No Prior Foot Ulcer: No No Prior Amputation: No No Charcot Joint: No No Ambulatory Status: Ambulatory Without Help Gait: Steady Electronic Signature(s) Signed: 09/15/2020 5:15:51 PM By: Georges Mouse, Minus Breeding RN Entered By: Georges Mouse, Minus Breeding on 09/15/2020 15:13:45 Henry Hill ([id number]) -------------------------------------------------------------------------------- Nutrition Risk Screening Details Patient Name: Henry Hill. Date of Service: 09/15/2020 2:30 PM Medical Record Number: [id number] Patient Account Number: [id number] Date of Birth/Sex: 01/11/1961 (60 y.o. Male) Treating RN: Dolan Amen Primary Care Neita Landrigan: Vernie Murders Other Clinician: Referring Joeziah Voit: Vernie Murders Treating Cristianna Cyr/Extender: Skipper Cliche in Treatment: 0 Height (in): 72 Weight (lbs): 170 Body Mass Index (BMI): 23.1 Nutrition Risk Screening Items Score Screening NUTRITION RISK SCREEN: I have an illness or condition that made me change the kind and/or amount of food I eat 0 No I eat fewer than two meals per day 0 No I eat few fruits and vegetables, or milk products 0 No I have three or more drinks of beer, liquor or wine almost every day 0 No I have tooth or mouth problems that make it hard for me to eat 0 No I don't always have enough money to buy the food I need 0 No I eat alone most of the time 0 No I take three or more different prescribed or over-the-counter drugs a day 1 Yes Without wanting to, I have lost or gained 10 pounds in the last six months 0 No I am not always physically able to shop, cook and/or feed myself 0 No Nutrition Protocols Good Risk Protocol 0 No interventions needed Moderate Risk Protocol High Risk Proctocol Risk Level: Good Risk Score: 1 Electronic Signature(s) Signed: 09/15/2020 5:15:51 PM By: Georges Mouse, Minus Breeding RN Entered By: Georges Mouse, Minus Breeding on 09/15/2020 15:13:30

## 2020-09-16 NOTE — Progress Notes (Signed)
Henry Hill, Henry Hill ([id number]) Visit Report for 09/15/2020 Chief Complaint Document Details Patient Name: Henry Hill, Henry Hill. Date of Service: 09/15/2020 2:30 PM Medical Record Number: [id number] Patient Account Number: [id number] Date of Birth/Sex: 03-10-1961 (60 y.o. Male) Treating RN: Donnamarie Poag Primary Care Provider: Vernie Murders Other Clinician: Referring Provider: Vernie Murders Treating Provider/Extender: Skipper Cliche in Treatment: 0 Information Obtained from: Patient Chief Complaint Left LE Ulcer Electronic Signature(s) Signed: 09/15/2020 3:48:22 PM By: Worthy Keeler PA-C Entered By: Worthy Keeler on 09/15/2020 15:48:22 Henry Hill ([id number]) -------------------------------------------------------------------------------- Debridement Details Patient Name: Henry Hill Date of Service: 09/15/2020 2:30 PM Medical Record Number: [id number] Patient Account Number: [id number] Date of Birth/Sex: 01-24-1961 (60 y.o. Male) Treating RN: Donnamarie Poag Primary Care Provider: Vernie Murders Other Clinician: Referring Provider: Vernie Murders Treating Provider/Extender: Skipper Cliche in Treatment: 0 Debridement Performed for Wound #1 Left,Medial Lower Leg Assessment: Performed By: Physician Tommie Sams., PA-C Debridement Type: Chemical/Enzymatic/Mechanical Agent Used: saline and gauze Severity of Tissue Pre Debridement: Fat layer exposed Level of Consciousness (Pre- Awake and Alert procedure): Pre-procedure Verification/Time Out Yes - 15:55 Taken: Instrument: Other : saline/gauze Bleeding: Minimum Hemostasis Achieved: Pressure Response to Treatment: Procedure was tolerated well Level of Consciousness (Post- Awake and Alert procedure): Post Debridement Measurements of Total Wound Length: (cm) 3 Width: (cm) 1.3 Depth: (cm) 0.1 Volume: (cm) 0.306 Character of Wound/Ulcer Post Debridement: Improved Severity of Tissue Post Debridement: Fat layer  exposed Post Procedure Diagnosis Same as Pre-procedure Electronic Signature(s) Signed: 09/15/2020 5:00:28 PM By: Donnamarie Poag Signed: 09/15/2020 6:51:50 PM By: Worthy Keeler PA-C Entered By: Donnamarie Poag on 09/15/2020 15:59:01 Henry Hill ([id number]) -------------------------------------------------------------------------------- HPI Details Patient Name: Henry Hill. Date of Service: 09/15/2020 2:30 PM Medical Record Number: [id number] Patient Account Number: [id number] Date of Birth/Sex: Aug 19, 1960 (60 y.o. Male) Treating RN: Donnamarie Poag Primary Care Provider: Vernie Murders Other Clinician: Referring Provider: Vernie Murders Treating Provider/Extender: Skipper Cliche in Treatment: 0 History of Present Illness HPI Description: Great than 1 year PCP Duodermo5/01/2021 upon evaluation today patient appears to be doing somewhat poorly in regard to the wound that he tells me has been dealing with for about a year. Has been working with his primary care provider to try to help get this wound healed. He comes in today as he states that they had "failed" and getting this to completely close. Fortunately there does not appear to be any signs of active infection he did have a lot of eschar and dried drainage on the surface of the wound area. With that being said we were able to carefully remove this today I did not even have to perform debridement. This appears to be doing quite well once removed. He does have a history of chronic venous insufficiency, peripheral vascular disease, atrial fibrillation, long-term use of anticoagulant therapy which has recently been started with Eliquis due to the fact that he was diagnosed with A. fib when he was in the hospital due to a bowel obstruction. He also has hypertension. Electronic Signature(s) Signed: 09/15/2020 6:30:12 PM By: Worthy Keeler PA-C Entered By: Worthy Keeler on 09/15/2020 18:30:12 Henry Hill  ([id number]) -------------------------------------------------------------------------------- Physical Exam Details Patient Name: Henry Hill, Henry Hill. Date of Service: 09/15/2020 2:30 PM Medical Record Number: [id number] Patient Account Number: [id number] Date of Birth/Sex: September 02, 1960 (60 y.o. Male) Treating RN: Donnamarie Poag Primary Care Provider: Vernie Murders Other Clinician: Referring Provider: Vernie Murders Treating Provider/Extender: Skipper Cliche in Treatment: 0 Constitutional patient is hypertensive.. pulse  regular and within target range for patient.Marland Kitchen respirations regular, non-labored and within target range for patient.Marland Kitchen temperature within target range for patient.. Well-nourished and well-hydrated in no acute distress. Eyes conjunctiva clear no eyelid edema noted. pupils equal round and reactive to light and accommodation. Ears, Nose, Mouth, and Throat no gross abnormality of ear auricles or external auditory canals. normal hearing noted during conversation. mucus membranes moist. Respiratory normal breathing without difficulty. Cardiovascular Absent posterior tibial and dorsalis pedis pulses bilateral lower extremities. no clubbing, cyanosis, significant edema, <3 sec cap refill. Musculoskeletal normal gait and posture. no significant deformity or arthritic changes, no loss or range of motion, no clubbing. Psychiatric this patient is able to make decisions and demonstrates good insight into disease process. Alert and Oriented x 3. pleasant and cooperative. Notes Upon inspection patient's wound bed actually showed signs again of some slough and eschar on the surface of the wound I was able to carefully clean this away today without complication and post mechanical debridement with saline and gauze he actually appear to be doing quite well which is great news. I do think that this is healing quite nicely I think he would benefit from a silver collagen dressing. He does not  really have significant swelling or edema at this point. His capillary refill appears to be okay but overall his ABI was very poor he tells me that he is seeing cardiology to have this checked up on and see where things stand in that regard. That is already in the works. Electronic Signature(s) Signed: 09/15/2020 6:30:52 PM By: Worthy Keeler PA-C Entered By: Worthy Keeler on 09/15/2020 18:30:52 Henry Hill ([id number]) -------------------------------------------------------------------------------- Physician Orders Details Patient Name: Henry Hill, Henry Hill. Date of Service: 09/15/2020 2:30 PM Medical Record Number: [id number] Patient Account Number: [id number] Date of Birth/Sex: [birth year] (60 y.o. Male) Treating RN: Donnamarie Poag Primary Care Provider: Vernie Murders Other Clinician: Referring Provider: Vernie Murders Treating Provider/Extender: Skipper Cliche in Treatment: 0 Verbal / Phone Orders: No Diagnosis Coding ICD-10 Coding Code Description I87.2 Venous insufficiency (chronic) (peripheral) L97.828 Non-pressure chronic ulcer of other part of left lower leg with other specified severity I48.0 Paroxysmal atrial fibrillation Z79.01 Long term (current) use of anticoagulants I10 Essential (primary) hypertension Follow-up Appointments Wound #1 Left,Medial Lower Leg o Return Appointment in 1 week. Bathing/ Shower/ Hygiene o May shower with wound dressing protected with water repellent cover or cast protector. - change dressing immediately after shower if becomes wet Wound Treatment Wound #1 - Lower Leg Wound Laterality: Left, Medial Cleanser: Soap and Water 3 x Per Week/30 Days Discharge Instructions: Gently cleanse wound with antibacterial soap, rinse and pat dry prior to dressing wounds Primary Dressing: Prisma 4.34 (in) (DME) (Generic) 3 x Per Week/30 Days Discharge Instructions: Moisten w/normal saline or sterile water; Cover wound as directed. Do not remove from wound  bed. Secondary Dressing: Mepilex Border Flex, 4x4 (in/in) (DME) (Generic) 3 x Per Week/30 Days Discharge Instructions: Apply to wound as directed. Do not cut. Electronic Signature(s) Signed: 09/15/2020 5:00:28 PM By: Donnamarie Poag Signed: 09/15/2020 6:51:50 PM By: Worthy Keeler PA-C Entered By: Donnamarie Poag on 09/15/2020 61:60:73 Henry Hill ([id number]) -------------------------------------------------------------------------------- Problem List Details Patient Name: Henry Hill, Henry Hill. Date of Service: 09/15/2020 2:30 PM Medical Record Number: [id number] Patient Account Number: [id number] Date of Birth/Sex: 1960-05-15 (60 y.o. Male) Treating RN: Donnamarie Poag Primary Care Provider: Vernie Murders Other Clinician: Referring Provider: Vernie Murders Treating Provider/Extender: Skipper Cliche in Treatment: 0 Active Problems ICD-10 Encounter Code  Description Active Date MDM Diagnosis I87.2 Venous insufficiency (chronic) (peripheral) 09/15/2020 No Yes L97.828 Non-pressure chronic ulcer of other part of left lower leg with other 09/15/2020 No Yes specified severity I48.0 Paroxysmal atrial fibrillation 09/15/2020 No Yes Z79.01 Long term (current) use of anticoagulants 09/15/2020 No Yes I10 Essential (primary) hypertension 09/15/2020 No Yes Inactive Problems Resolved Problems Electronic Signature(s) Signed: 09/15/2020 3:48:08 PM By: Worthy Keeler PA-C Entered By: Worthy Keeler on 09/15/2020 15:48:07 Henry Hill ([id number]) -------------------------------------------------------------------------------- Progress Note Details Patient Name: Henry Hill. Date of Service: 09/15/2020 2:30 PM Medical Record Number: [id number] Patient Account Number: [id number] Date of Birth/Sex: March 06, 1961 (60 y.o. Male) Treating RN: Donnamarie Poag Primary Care Provider: Vernie Murders Other Clinician: Referring Provider: Vernie Murders Treating Provider/Extender: Skipper Cliche in Treatment:  0 Subjective Chief Complaint Information obtained from Patient Left LE Ulcer History of Present Illness (HPI) Great than 1 year PCP Duodermo5/01/2021 upon evaluation today patient appears to be doing somewhat poorly in regard to the wound that he tells me has been dealing with for about a year. Has been working with his primary care provider to try to help get this wound healed. He comes in today as he states that they had "failed" and getting this to completely close. Fortunately there does not appear to be any signs of active infection he did have a lot of eschar and dried drainage on the surface of the wound area. With that being said we were able to carefully remove this today I did not even have to perform debridement. This appears to be doing quite well once removed. He does have a history of chronic venous insufficiency, peripheral vascular disease, atrial fibrillation, long-term use of anticoagulant therapy which has recently been started with Eliquis due to the fact that he was diagnosed with A. fib when he was in the hospital due to a bowel obstruction. He also has hypertension. Patient History Information obtained from Patient. Allergies No Known Allergies Social History Current every day smoker, Marital Status - Married, Alcohol Use - Daily, Drug Use - No History, Caffeine Use - Never. Medical History Eyes Denies history of Cataracts, Glaucoma, Optic Neuritis Ear/Nose/Mouth/Throat Denies history of Chronic sinus problems/congestion, Middle ear problems Hematologic/Lymphatic Denies history of Anemia, Hemophilia, Human Immunodeficiency Virus, Lymphedema, Sickle Cell Disease Respiratory Denies history of Aspiration, Asthma, Chronic Obstructive Pulmonary Disease (COPD), Pneumothorax, Sleep Apnea, Tuberculosis Cardiovascular Patient has history of Arrhythmia, Hypertension Denies history of Congestive Heart Failure, Coronary Artery Disease, Deep Vein Thrombosis, Hypotension,  Myocardial Infarction, Peripheral Arterial Disease, Peripheral Venous Disease, Phlebitis, Vasculitis Gastrointestinal Denies history of Cirrhosis , Colitis, Crohn s, Hepatitis A, Hepatitis B, Hepatitis C Endocrine Denies history of Type I Diabetes, Type II Diabetes Genitourinary Denies history of End Stage Renal Disease Immunological Denies history of Lupus Erythematosus, Raynaud s, Scleroderma Integumentary (Skin) Denies history of History of Burn, History of pressure wounds Musculoskeletal Denies history of Gout, Rheumatoid Arthritis, Osteoarthritis, Osteomyelitis Neurologic Denies history of Dementia, Neuropathy, Quadriplegia, Paraplegia, Seizure Disorder Oncologic Denies history of Received Chemotherapy, Received Radiation Psychiatric Denies history of Anorexia/bulimia, Confinement Anxiety Review of Systems (ROS) Constitutional Symptoms (General Health) Denies complaints or symptoms of Fatigue, Fever, Chills, Marked Weight Change. Eyes Denies complaints or symptoms of Dry Eyes, Vision Changes, Glasses / Contacts. Ear/Nose/Mouth/Throat Denies complaints or symptoms of Difficult clearing ears, Sinusitis. Hematologic/Lymphatic [facility], [facility] ([id number]) Denies complaints or symptoms of Bleeding / Clotting Disorders, Human Immunodeficiency Virus. Respiratory Denies complaints or symptoms of Chronic or frequent coughs, Shortness of Breath. Cardiovascular  Denies complaints or symptoms of Chest pain, LE edema. Gastrointestinal Denies complaints or symptoms of Frequent diarrhea, Nausea, Vomiting. Endocrine Denies complaints or symptoms of Hepatitis, Thyroid disease, Polydypsia (Excessive Thirst). Genitourinary Denies complaints or symptoms of Kidney failure/ Dialysis, Incontinence/dribbling. Immunological Denies complaints or symptoms of Hives, Itching. Integumentary (Skin) Complains or has symptoms of Wounds, Swelling. Denies complaints or symptoms of Bleeding or bruising  tendency, Breakdown. Musculoskeletal Denies complaints or symptoms of Muscle Pain, Muscle Weakness. Neurologic Denies complaints or symptoms of Numbness/parasthesias, Focal/Weakness. Psychiatric Denies complaints or symptoms of Anxiety, Claustrophobia. Objective Constitutional patient is hypertensive.. pulse regular and within target range for patient.Marland Kitchen respirations regular, non-labored and within target range for patient.Marland Kitchen temperature within target range for patient.. Well-nourished and well-hydrated in no acute distress. Vitals Time Taken: 3:00 PM, Height: 72 in, Weight: 170 lbs, BMI: 23.1, Temperature: 97.7 F, Pulse: 72 bpm, Respiratory Rate: 16 breaths/min, Blood Pressure: 152/89 mmHg. Eyes conjunctiva clear no eyelid edema noted. pupils equal round and reactive to light and accommodation. Ears, Nose, Mouth, and Throat no gross abnormality of ear auricles or external auditory canals. normal hearing noted during conversation. mucus membranes moist. Respiratory normal breathing without difficulty. Cardiovascular Absent posterior tibial and dorsalis pedis pulses bilateral lower extremities. no clubbing, cyanosis, significant edema, Musculoskeletal normal gait and posture. no significant deformity or arthritic changes, no loss or range of motion, no clubbing. Psychiatric this patient is able to make decisions and demonstrates good insight into disease process. Alert and Oriented x 3. pleasant and cooperative. General Notes: Upon inspection patient's wound bed actually showed signs again of some slough and eschar on the surface of the wound I was able to carefully clean this away today without complication and post mechanical debridement with saline and gauze he actually appear to be doing quite well which is great news. I do think that this is healing quite nicely I think he would benefit from a silver collagen dressing. He does not really have significant swelling or edema at this  point. His capillary refill appears to be okay but overall his ABI was very poor he tells me that he is seeing cardiology to have this checked up on and see where things stand in that regard. That is already in the works. Integumentary (Hair, Skin) Wound #1 status is Open. Original cause of wound was Gradually Appeared. The date acquired was: 08/09/2019. The wound is located on the Left,Medial Lower Leg. The wound measures 3cm length x 1.3cm width x 0.1cm depth; 3.063cm^2 area and 0.306cm^3 volume. There is Fat Layer (Subcutaneous Tissue) exposed. There is no tunneling or undermining noted. There is a medium amount of serous drainage noted. There is no granulation within the wound bed. There is a large (67-100%) amount of necrotic tissue within the wound bed including Eschar. Assessment Henry Hill, Henry Hill ([id number]) Active Problems ICD-10 Venous insufficiency (chronic) (peripheral) Non-pressure chronic ulcer of other part of left lower leg with other specified severity Paroxysmal atrial fibrillation Long term (current) use of anticoagulants Essential (primary) hypertension Procedures Wound #1 Pre-procedure diagnosis of Wound #1 is a Venous Leg Ulcer located on the Left,Medial Lower Leg .Severity of Tissue Pre Debridement is: Fat layer exposed. There was a Chemical/Enzymatic/Mechanical debridement performed by Tommie Sams., PA-C. With the following instrument(s): saline/gauze. Other agent used was saline and gauze. A time out was conducted at 15:55, prior to the start of the procedure. A Minimum amount of bleeding was controlled with Pressure. The procedure was tolerated well. Post Debridement Measurements: 3cm length x  1.3cm width x 0.1cm depth; 0.306cm^3 volume. Character of Wound/Ulcer Post Debridement is improved. Severity of Tissue Post Debridement is: Fat layer exposed. Post procedure Diagnosis Wound #1: Same as Pre-Procedure Plan Follow-up Appointments: Wound #1 Left,Medial Lower  Leg: Return Appointment in 1 week. Bathing/ Shower/ Hygiene: May shower with wound dressing protected with water repellent cover or cast protector. - change dressing immediately after shower if becomes wet WOUND #1: - Lower Leg Wound Laterality: Left, Medial Cleanser: Soap and Water 3 x Per Week/30 Days Discharge Instructions: Gently cleanse wound with antibacterial soap, rinse and pat dry prior to dressing wounds Primary Dressing: Prisma 4.34 (in) (DME) (Generic) 3 x Per Week/30 Days Discharge Instructions: Moisten w/normal saline or sterile water; Cover wound as directed. Do not remove from wound bed. Secondary Dressing: Mepilex Border Flex, 4x4 (in/in) (DME) (Generic) 3 x Per Week/30 Days Discharge Instructions: Apply to wound as directed. Do not cut. 1. Would recommend currently that we going to continue with the wound care measures as before and the patient is in agreement with plan this includes the use of the silver collagen dressing which in general seems to be doing excellent. 2. I am also can recommend that we initiate treatment with a border foam dressing. I think this is a good job of protecting skin well at the same time being easy to apply and subsequently allowing this to still drain better than say a Lockhart which would also be more harsh on the skin. He is in agreement with that plan. 3. I would also recommend he continue to elevate his legs to make sure there is no swelling right now there does not appear to be any significant swelling which is great news as well. We will see patient back for reevaluation in 1 week here in the clinic. If anything worsens or changes patient will contact our office for additional recommendations. Electronic Signature(s) Signed: 09/15/2020 6:32:20 PM By: Worthy Keeler PA-C Entered By: Worthy Keeler on 09/15/2020 18:32:19 Henry Hill, Henry Hill  ([id number]) -------------------------------------------------------------------------------- ROS/PFSH Details Patient Name: Henry Hill, Henry Hill. Date of Service: 09/15/2020 2:30 PM Medical Record Number: [id number] Patient Account Number: [id number] Date of Birth/Sex: [birth year] (61 y.o. Male) Treating RN: Dolan Amen Primary Care Provider: Vernie Murders Other Clinician: Referring Provider: Vernie Murders Treating Provider/Extender: Skipper Cliche in Treatment: 0 Information Obtained From Patient Constitutional Symptoms (General Health) Complaints and Symptoms: Negative for: Fatigue; Fever; Chills; Marked Weight Change Eyes Complaints and Symptoms: Negative for: Dry Eyes; Vision Changes; Glasses / Contacts Medical History: Negative for: Cataracts; Glaucoma; Optic Neuritis Ear/Nose/Mouth/Throat Complaints and Symptoms: Negative for: Difficult clearing ears; Sinusitis Medical History: Negative for: Chronic sinus problems/congestion; Middle ear problems Hematologic/Lymphatic Complaints and Symptoms: Negative for: Bleeding / Clotting Disorders; Human Immunodeficiency Virus Medical History: Negative for: Anemia; Hemophilia; Human Immunodeficiency Virus; Lymphedema; Sickle Cell Disease Respiratory Complaints and Symptoms: Negative for: Chronic or frequent coughs; Shortness of Breath Medical History: Negative for: Aspiration; Asthma; Chronic Obstructive Pulmonary Disease (COPD); Pneumothorax; Sleep Apnea; Tuberculosis Cardiovascular Complaints and Symptoms: Negative for: Chest pain; LE edema Medical History: Positive for: Arrhythmia; Hypertension Negative for: Congestive Heart Failure; Coronary Artery Disease; Deep Vein Thrombosis; Hypotension; Myocardial Infarction; Peripheral Arterial Disease; Peripheral Venous Disease; Phlebitis; Vasculitis Gastrointestinal Complaints and Symptoms: Negative for: Frequent diarrhea; Nausea; Vomiting Medical History: Negative for: Cirrhosis  ; Colitis; Crohnos; Hepatitis A; Hepatitis B; Hepatitis C Endocrine Henry Hill, Henry C. ([id number]) Complaints and Symptoms: Negative for: Hepatitis; Thyroid disease; Polydypsia (Excessive Thirst) Medical History: Negative for: Type I Diabetes;  Type II Diabetes Genitourinary Complaints and Symptoms: Negative for: Kidney failure/ Dialysis; Incontinence/dribbling Medical History: Negative for: End Stage Renal Disease Immunological Complaints and Symptoms: Negative for: Hives; Itching Medical History: Negative for: Lupus Erythematosus; Raynaudos; Scleroderma Integumentary (Skin) Complaints and Symptoms: Positive for: Wounds; Swelling Negative for: Bleeding or bruising tendency; Breakdown Medical History: Negative for: History of Burn; History of pressure wounds Musculoskeletal Complaints and Symptoms: Negative for: Muscle Pain; Muscle Weakness Medical History: Negative for: Gout; Rheumatoid Arthritis; Osteoarthritis; Osteomyelitis Neurologic Complaints and Symptoms: Negative for: Numbness/parasthesias; Focal/Weakness Medical History: Negative for: Dementia; Neuropathy; Quadriplegia; Paraplegia; Seizure Disorder Psychiatric Complaints and Symptoms: Negative for: Anxiety; Claustrophobia Medical History: Negative for: Anorexia/bulimia; Confinement Anxiety Oncologic Medical History: Negative for: Received Chemotherapy; Received Radiation Immunizations Pneumococcal Vaccine: Received Pneumococcal Vaccination: No Implantable Devices None Family and Social History Current every day smoker; Marital Status - Married; Alcohol Use: Daily; Drug Use: No History; Caffeine Use: Never Henry Hill, Henry Hill ([id number]) Electronic Signature(s) Signed: 09/15/2020 5:15:51 PM By: Georges Mouse, Minus Breeding RN Signed: 09/15/2020 6:51:50 PM By: Worthy Keeler PA-C Entered By: Georges Mouse, Minus Breeding on 09/15/2020 15:11:53 Henry Hill  ([id number]) -------------------------------------------------------------------------------- SuperBill Details Patient Name: Henry Hill, Henry Hill. Date of Service: 09/15/2020 Medical Record Number: [id number] Patient Account Number: [id number] Date of Birth/Sex: [birth year] (60 y.o. Male) Treating RN: Donnamarie Poag Primary Care Provider: Vernie Murders Other Clinician: Referring Provider: Vernie Murders Treating Provider/Extender: Skipper Cliche in Treatment: 0 Diagnosis Coding ICD-10 Codes Code Description I87.2 Venous insufficiency (chronic) (peripheral) L97.828 Non-pressure chronic ulcer of other part of left lower leg with other specified severity I48.0 Paroxysmal atrial fibrillation Z79.01 Long term (current) use of anticoagulants I10 Essential (primary) hypertension Facility Procedures CPT4 Code: [id number] Description: 63016 - WOUND CARE VISIT-LEV 3 EST PT Modifier: Quantity: 1 Physician Procedures CPT4 Code: [id number] Description: WC PHYS LEVEL 3 o NEW PT Modifier: Quantity: 1 CPT4 Code: Description: ICD-10 Diagnosis Description I87.2 Venous insufficiency (chronic) (peripheral) L97.828 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

## 2020-09-17 ENCOUNTER — Encounter: Payer: Self-pay | Admitting: Surgery

## 2020-09-17 ENCOUNTER — Ambulatory Visit (INDEPENDENT_AMBULATORY_CARE_PROVIDER_SITE_OTHER): Payer: 59 | Admitting: Surgery

## 2020-09-17 ENCOUNTER — Other Ambulatory Visit: Payer: Self-pay

## 2020-09-17 VITALS — BP 130/84 | HR 74 | Temp 98.4°F | Ht 72.0 in | Wt 172.0 lb

## 2020-09-17 DIAGNOSIS — Z09 Encounter for follow-up examination after completed treatment for conditions other than malignant neoplasm: Secondary | ICD-10-CM

## 2020-09-17 DIAGNOSIS — K562 Volvulus: Secondary | ICD-10-CM

## 2020-09-17 DIAGNOSIS — Z9049 Acquired absence of other specified parts of digestive tract: Secondary | ICD-10-CM

## 2020-09-17 NOTE — Patient Instructions (Addendum)
We removed the drain today. You will need to keep a dry dressing over the area until it heals completely. You may remove the dressing prior to showering and wash the area with soap and water.   Please see your follow up appointment listed below.

## 2020-09-17 NOTE — Progress Notes (Signed)
09/17/2020  HPI: Henry Hill is a 60 y.o. 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  Patient presents today for follow-up.  On his last visit, his staples were removed but the [facility] drain was still high output.  Patient reports that the output has been decreasing and has remained serous.  Denies any worsening pain, and reports able to eat well and with good bowel function.  Vital signs: BP 130/84   Pulse 74   Temp 98.4 F (36.9 C) (Oral)   Ht 6' (1.829 m)   Wt 172 lb (78 kg)   SpO2 98%   BMI 23.33 kg/m    Physical Exam: Constitutional: No acute distress Abdomen: Soft, nondistended, appropriate sore to palpation.  Midline incision is healing well with Steri-Strips in place.  Right lower quadrant drain with serous fluid.  This was removed at bedside without complications.  Dressed with dry gauze dressing and tape  Assessment/Plan: This is a 60 y.o. male s/p exploratory laparotomy and right colectomy.  Keenan Bachelor drain was removed without any complications.  Dry gauze dressing applied.  Instructed patient on daily gauze dressing changes until the wound is fully healed. - Continue activity restrictions with no heavy lifting or pushing of no more than 10 to 15 pounds. - Follow-up in 3 weeks.   Melvyn Neth, [facility]

## 2020-09-22 ENCOUNTER — Other Ambulatory Visit: Payer: Self-pay

## 2020-09-22 ENCOUNTER — Encounter: Payer: 59 | Admitting: Physician Assistant

## 2020-09-22 DIAGNOSIS — L97828 Non-pressure chronic ulcer of other part of left lower leg with other specified severity: Secondary | ICD-10-CM | POA: Diagnosis not present

## 2020-09-22 NOTE — Progress Notes (Addendum)
NHAN, QUALLEY ([id number]) Visit Report for 09/22/2020 Chief Complaint Document Details Patient Name: Henry Hill, Henry Hill. Date of Service: 09/22/2020 9:30 AM Medical Record Number: [id number] Patient Account Number: [id number] Date of Birth/Sex: 05/15/1960 (60 y.o. M) Treating RN: Donnamarie Poag Primary Care Provider: Vernie Murders Other Clinician: Referring Provider: Vernie Murders Treating Provider/Extender: Skipper Cliche in Treatment: 1 Information Obtained from: Patient Chief Complaint Left LE Ulcer Electronic Signature(s) Signed: 09/22/2020 10:09:03 AM By: Worthy Keeler PA-C Entered By: Worthy Keeler on 09/22/2020 10:09:03 Henry Hill ([id number]) -------------------------------------------------------------------------------- Debridement Details Patient Name: Henry Hill Date of Service: 09/22/2020 9:30 AM Medical Record Number: [id number] Patient Account Number: [id number] Date of Birth/Sex: 1960-06-13 (60 y.o. M) Treating RN: Donnamarie Poag Primary Care Provider: Vernie Murders Other Clinician: Referring Provider: Vernie Murders Treating Provider/Extender: Skipper Cliche in Treatment: 1 Debridement Performed for Wound #1 Left,Medial Lower Leg Assessment: Performed By: Physician Tommie Sams., PA-C Debridement Type: Debridement Severity of Tissue Pre Debridement: Fat layer exposed Level of Consciousness (Pre- Awake and Alert procedure): Pre-procedure Verification/Time Out Yes - 10:12 Taken: Start Time: 10:12 Total Area Debrided (L x W): 2 (cm) x 9 (cm) = 18 (cm) Tissue and other material Non-Viable, Slough, Subcutaneous, Slough debrided: Level: Skin/Subcutaneous Tissue Debridement Description: Excisional Instrument: Curette Bleeding: Minimum Hemostasis Achieved: Pressure End Time: 10:14 Response to Treatment: Procedure was tolerated well Level of Consciousness (Post- Awake and Alert procedure): Post Debridement Measurements of Total  Wound Length: (cm) 2 Width: (cm) 0.9 Depth: (cm) 0.3 Volume: (cm) 0.424 Character of Wound/Ulcer Post Debridement: Improved Severity of Tissue Post Debridement: Fat layer exposed Post Procedure Diagnosis Same as Pre-procedure Electronic Signature(s) Signed: 09/22/2020 5:15:41 PM By: Worthy Keeler PA-C Signed: 09/23/2020 2:35:55 PM By: Donnamarie Poag Entered By: Donnamarie Poag on 09/22/2020 10:15:42 Henry Hill ([id number]) -------------------------------------------------------------------------------- HPI Details Patient Name: Henry Hill. Date of Service: 09/22/2020 9:30 AM Medical Record Number: [id number] Patient Account Number: [id number] Date of Birth/Sex: [birth year] (60 y.o. M) Treating RN: Donnamarie Poag Primary Care Provider: Vernie Murders Other Clinician: Referring Provider: Vernie Murders Treating Provider/Extender: Skipper Cliche in Treatment: 1 History of Present Illness HPI Description: 09/15/2020 upon evaluation today patient appears to be doing somewhat poorly in regard to the wound that he tells me has been dealing with for about a year. Has been working with his primary care provider to try to help get this wound healed. He comes in today as he states that they had "failed" and getting this to completely close. Fortunately there does not appear to be any signs of active infection he did have a lot of eschar and dried drainage on the surface of the wound area. With that being said we were able to carefully remove this today I did not even have to perform debridement. This appears to be doing quite well once removed. He does have a history of chronic venous insufficiency, peripheral vascular disease, atrial fibrillation, long-term use of anticoagulant therapy which has recently been started with Eliquis due to the fact that he was diagnosed with A. fib when he was in the hospital due to a bowel obstruction. He also has hypertension. 09/22/2020 upon evaluation today  patient appears to be doing well with regard to his wound. He has been tolerating the dressing changes without complication. Fortunately there is no signs of active infection at this time. No fevers, chills, nausea, vomiting, or diarrhea. Electronic Signature(s) Signed: 09/22/2020 10:20:16 AM By: Worthy Keeler PA-C Entered By: Worthy Keeler  on 09/22/2020 10:20:16 Henry Hill, Henry Hill ([id number]) -------------------------------------------------------------------------------- Physical Exam Details Patient Name: Henry Hill, Henry Hill. Date of Service: 09/22/2020 9:30 AM Medical Record Number: [id number] Patient Account Number: [id number] Date of Birth/Sex: 05/11/1960 (60 y.o. M) Treating RN: Donnamarie Poag Primary Care Provider: Vernie Murders Other Clinician: Referring Provider: Vernie Murders Treating Provider/Extender: Skipper Cliche in Treatment: 1 Constitutional Well-nourished and well-hydrated in no acute distress. Respiratory normal breathing without difficulty. Psychiatric this patient is able to make decisions and demonstrates good insight into disease process. Alert and Oriented x 3. pleasant and cooperative. Notes Upon inspection patient's wound bed showed signs actually of some improvement compared to last time which I think is good news. There was a little bit of skin which was somewhat trapping fluid around the edges of the wound that I did perform debridement of to clear away. Also clear away some of the necrotic debris on the surface of the wound this was minimal just a little bit of slough and biofilm. He tolerated this with some discomfort but nothing too dramatic. Electronic Signature(s) Signed: 09/22/2020 10:20:41 AM By: Worthy Keeler PA-C Entered By: Worthy Keeler on 09/22/2020 10:20:41 Henry Hill ([id number]) -------------------------------------------------------------------------------- Physician Orders Details Patient Name: Henry Hill, Henry Hill. Date of Service:  09/22/2020 9:30 AM Medical Record Number: [id number] Patient Account Number: [id number] Date of Birth/Sex: May 20, 1960 (60 y.o. M) Treating RN: Donnamarie Poag Primary Care Provider: Vernie Murders Other Clinician: Referring Provider: Vernie Murders Treating Provider/Extender: Skipper Cliche in Treatment: 1 Verbal / Phone Orders: No Diagnosis Coding ICD-10 Coding Code Description I87.2 Venous insufficiency (chronic) (peripheral) L97.828 Non-pressure chronic ulcer of other part of left lower leg with other specified severity I48.0 Paroxysmal atrial fibrillation Z79.01 Long term (current) use of anticoagulants I10 Essential (primary) hypertension Follow-up Appointments Wound #1 Left,Medial Lower Leg o Return Appointment in 1 week. Bathing/ Shower/ Hygiene o May shower with wound dressing protected with water repellent cover or cast protector. - change dressing immediately after shower if becomes wet Wound Treatment Wound #1 - Lower Leg Wound Laterality: Left, Medial Cleanser: Soap and Water 3 x Per Week/30 Days Discharge Instructions: Gently cleanse wound with antibacterial soap, rinse and pat dry prior to dressing wounds Primary Dressing: Prisma 4.34 (in) (Generic) 3 x Per Week/30 Days Discharge Instructions: Moisten w/normal saline or sterile water; Cover wound as directed. Do not remove from wound bed. Secondary Dressing: Mepilex Border Flex, 4x4 (in/in) (Generic) 3 x Per Week/30 Days Discharge Instructions: Apply to wound as directed. Do not cut. Electronic Signature(s) Signed: 09/22/2020 5:15:41 PM By: Worthy Keeler PA-C Signed: 09/23/2020 2:35:55 PM By: Donnamarie Poag Entered By: Donnamarie Poag on 09/22/2020 10:16:27 Henry Hill ([id number]) -------------------------------------------------------------------------------- Problem List Details Patient Name: Henry Hill, Henry Hill. Date of Service: 09/22/2020 9:30 AM Medical Record Number: [id number] Patient Account Number:  [id number] Date of Birth/Sex: Sep 08, 1960 (60 y.o. 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 Date of Service: 09/22/2020 9:30 AM Medical Record Number: [id number] Patient Account Number: [id number] Date of Birth/Sex: 1961-03-12 (60 y.o. M) Treating RN: Donnamarie Poag Primary Care Provider: Vernie Murders Other Clinician: Referring Provider: Vernie Murders Treating Provider/Extender: Skipper Cliche in Treatment: 1 Subjective Chief Complaint Information obtained from Patient Left LE Ulcer History of Present Illness (HPI) 09/15/2020 upon evaluation today patient appears to be doing somewhat poorly in regard to the wound that he tells me has been dealing with for about a year. Has been working with his primary care provider to try to help get this wound healed. He comes in today as he states that they had "failed" and getting this to completely close. Fortunately there does not appear to be any signs of active infection he did have a lot of eschar and dried drainage on the surface of the wound area. With  that being said we were able to carefully remove this today I did not even have to perform debridement. This appears to be doing quite well once removed. He does have a history of chronic venous insufficiency, peripheral vascular disease, atrial fibrillation, long-term use of anticoagulant therapy which has recently been started with Eliquis due to the fact that he was diagnosed with A. fib when he was in the hospital due to a bowel obstruction. He also has hypertension. 09/22/2020 upon evaluation today patient appears to be doing well with regard to his wound. He has been tolerating the dressing changes without complication. Fortunately there is no signs of active infection at this time. No fevers, chills, nausea, vomiting, or diarrhea. Objective Constitutional Well-nourished and well-hydrated in no acute distress. Vitals Time Taken: 9:55 AM, Height: 72 in, Weight: 170 lbs, BMI: 23.1, Temperature: 97.9 F, Pulse: 69 bpm, Respiratory Rate: 18 breaths/min, Blood Pressure: 138/82 mmHg. Respiratory normal breathing without difficulty. Psychiatric this patient is able to make decisions and demonstrates good insight into disease process. Alert and Oriented x 3. pleasant and cooperative. General Notes: Upon inspection patient's wound bed showed signs actually of some improvement compared to last time which I think is good news. There was a little bit of skin which was somewhat trapping fluid around the edges of the wound that I did perform debridement of to clear away. Also clear away some of the necrotic debris on the surface of the wound this was minimal just a little bit of slough and biofilm. He tolerated this with some discomfort but nothing too dramatic. Integumentary (Hair, Skin) Wound #1 status is Open. Original cause of wound was Gradually Appeared. The date acquired was: 08/09/2019. The wound has been in treatment 1 weeks. The wound is located on the Left,Medial Lower Leg. The wound measures  2cm length x 0.9cm width x 0.2cm depth; 1.414cm^2 area and 0.283cm^3 volume. There is Fat Layer (Subcutaneous Tissue) exposed. There is no tunneling or undermining noted. There is a medium amount of serous drainage noted. There is small (1-33%) pink granulation within the wound bed. There is a large (67-100%) amount of necrotic tissue within the wound bed including Eschar. Assessment Active Problems ICD-10 Venous insufficiency (chronic) (peripheral) Non-pressure chronic ulcer of other part of left lower leg with other specified severity Henry Hill, Henry Hill. ([id number]) Paroxysmal atrial fibrillation Long term (current) use of anticoagulants Essential (primary) hypertension Procedures Wound #1 Pre-procedure diagnosis of Wound #1 is a Venous Leg Ulcer located on the Left,Medial Lower Leg .Severity of Tissue Pre Debridement is: Fat layer exposed. There was  a Excisional Skin/Subcutaneous Tissue Debridement with a total area of 18 sq cm performed by Tommie Sams., PA-C. With the following instrument(s): Curette to remove Non-Viable tissue/material. Material removed includes Subcutaneous Tissue and Slough and. A time out was conducted at 10:12, prior to the start of the procedure. A Minimum amount of bleeding was controlled with Pressure. The procedure was tolerated well. Post Debridement Measurements: 2cm length x 0.9cm width x 0.3cm depth; 0.424cm^3 volume. Character of Wound/Ulcer Post Debridement is improved. Severity of Tissue Post Debridement is: Fat layer exposed. Post procedure Diagnosis Wound #1: Same as Pre-Procedure Plan Follow-up Appointments: Wound #1 Left,Medial Lower Leg: Return Appointment in 1 week. Bathing/ Shower/ Hygiene: May shower with wound dressing protected with water repellent cover or cast protector. - change dressing immediately after shower if becomes wet WOUND #1: - Lower Leg Wound Laterality: Left, Medial Cleanser: Soap and Water 3 x Per Week/30 Days Discharge  Instructions: Gently cleanse wound with antibacterial soap, rinse and pat dry prior to dressing wounds Primary Dressing: Prisma 4.34 (in) (Generic) 3 x Per Week/30 Days Discharge Instructions: Moisten w/normal saline or sterile water; Cover wound as directed. Do not remove from wound bed. Secondary Dressing: Mepilex Border Flex, 4x4 (in/in) (Generic) 3 x Per Week/30 Days Discharge Instructions: Apply to wound as directed. Do not cut. 1. Would recommend that we go ahead and continue with the silver collagen at this point. I think this is probably the best way to go. 2. I am also can recommend to continue to cover with a border foam dressing. 3. I am also going to suggest that we continue to monitor for any signs of worsening from the standpoint of infection. Obviously I think he may warrant compression although he was seeing cardiology in order to have some testing with regard to his blood flow therefore on hold off on that for the time being. We will see patient back for reevaluation in 1 week here in the clinic. If anything worsens or changes patient will contact our office for additional recommendations. Electronic Signature(s) Signed: 09/22/2020 10:21:17 AM By: Worthy Keeler PA-C Entered By: Worthy Keeler on 09/22/2020 10:21:17 Henry Hill ([id number]) -------------------------------------------------------------------------------- SuperBill Details Patient Name: Henry Hill Date of Service: 09/22/2020 Medical Record Number: [id number] Patient Account Number: [id number] Date of Birth/Sex: [birth year] (60 y.o. 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  severity I48.0 Paroxysmal atrial fibrillation Z79.01 Long term (current) use of anticoagulants I10 Essential (primary) hypertension Facility Procedures CPT4 Code: [id number] Description: 55374 - DEB SUBQ TISSUE 20 SQ CM/< Modifier: Quantity: 1 CPT4 Code: Description: ICD-10 Diagnosis Description L97.828 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

## 2020-09-23 NOTE — Progress Notes (Signed)
PERNELL, LENOIR ([id number]) Visit Report for 09/22/2020 Arrival Information Details Patient Name: Henry Hill, Henry Hill. Date of Service: 09/22/2020 9:30 AM Medical Record Number: [id number] Patient Account Number: [id number] Date of Birth/Sex: 04-02-1961 (60 y.o. M) Treating RN: Carlene Coria Primary Care Everette Dimauro: Vernie Murders Other Clinician: Referring Montavis Schubring: Vernie Murders Treating Amarra Sawyer/Extender: Skipper Cliche in Treatment: 1 Visit Information History Since Last Visit All ordered tests and consults were completed: No Patient Arrived: Ambulatory Added or deleted any medications: No Arrival Time: 09:51 Any new allergies or adverse reactions: No Accompanied By: self Had a fall or experienced change in No Transfer Assistance: None activities of daily living that may affect Patient Identification Verified: Yes risk of falls: Secondary Verification Process Completed: Yes Signs or symptoms of abuse/neglect since last visito No Patient Requires Transmission-Based No Hospitalized since last visit: No Precautions: Implantable device outside of the clinic excluding No Patient Has Alerts: Yes cellular tissue based products placed in the center Patient Alerts: Patient on Blood since last visit: Thinner Has Dressing in Place as Prescribed: Yes ELIQUIS Pain Present Now: No NOT A DIABETIC Electronic Signature(s) Signed: 09/22/2020 8:20:21 PM By: Carlene Coria RN Entered By: Carlene Coria on 09/22/2020 09:54:30 Henry Hill ([id number]) -------------------------------------------------------------------------------- Clinic Level of Care Assessment Details Patient Name: Henry Hill. Date of Service: 09/22/2020 9:30 AM Medical Record Number: [id number] Patient Account Number: [id number] Date of Birth/Sex: Jun 23, 1960 (60 y.o. M) Treating RN: Donnamarie Poag Primary Care Jaques Mineer: Vernie Murders Other Clinician: Referring Zarayah Lanting: Vernie Murders Treating Haldon Carley/Extender:  Skipper Cliche in Treatment: 1 Clinic Level of Care Assessment Items TOOL 1 Quantity Score '[]'  - Use when EandM and Procedure is performed on INITIAL visit 0 ASSESSMENTS - Nursing Assessment / Reassessment '[]'  - General Physical Exam (combine w/ comprehensive assessment (listed just below) when performed on new 0 pt. evals) '[]'  - 0 Comprehensive Assessment (HX, ROS, Risk Assessments, Wounds Hx, etc.) ASSESSMENTS - Wound and Skin Assessment / Reassessment '[]'  - Dermatologic / Skin Assessment (not related to wound area) 0 ASSESSMENTS - Ostomy and/or Continence Assessment and Care '[]'  - Incontinence Assessment and Management 0 '[]'  - 0 Ostomy Care Assessment and Management (repouching, etc.) PROCESS - Coordination of Care '[]'  - Simple Patient / Family Education for ongoing care 0 '[]'  - 0 Complex (extensive) Patient / Family Education for ongoing care '[]'  - 0 Staff obtains Programmer, systems, Records, Test Results / Process Orders '[]'  - 0 Staff telephones HHA, Nursing Homes / Clarify orders / etc '[]'  - 0 Routine Transfer to another Facility (non-emergent condition) '[]'  - 0 Routine Hospital Admission (non-emergent condition) '[]'  - 0 New Admissions / Biomedical engineer / Ordering NPWT, Apligraf, etc. '[]'  - 0 Emergency Hospital Admission (emergent condition) PROCESS - Special Needs '[]'  - Pediatric / Minor Patient Management 0 '[]'  - 0 Isolation Patient Management '[]'  - 0 Hearing / Language / Visual special needs '[]'  - 0 Assessment of Community assistance (transportation, D/C planning, etc.) '[]'  - 0 Additional assistance / Altered mentation '[]'  - 0 Support Surface(s) Assessment (bed, cushion, seat, etc.) INTERVENTIONS - Miscellaneous '[]'  - External ear exam 0 '[]'  - 0 Patient Transfer (multiple staff / Civil Service fast streamer / Similar devices) '[]'  - 0 Simple Staple / Suture removal (25 or less) '[]'  - 0 Complex Staple / Suture removal (26 or more) '[]'  - 0 Hypo/Hyperglycemic Management (do not check if billed  separately) '[]'  - 0 Ankle / Brachial Index (ABI) - do not check if billed separately Has the patient been seen at the hospital  within the last three years: Yes Total Score: 0 Level Of Care: ____ Henry Hill ([id number]) Electronic Signature(s) Signed: 09/23/2020 2:35:55 PM By: Donnamarie Poag Entered By: Donnamarie Poag on 09/22/2020 10:16:34 Henry Hill ([id number]) -------------------------------------------------------------------------------- Encounter Discharge Information Details Patient Name: Henry Hill, Henry Hill. Date of Service: 09/22/2020 9:30 AM Medical Record Number: [id number] Patient Account Number: [id number] Date of Birth/Sex: [birth year] (60 y.o. M) Treating RN: Donnamarie Poag Primary Care Angelize Ryce: Vernie Murders Other Clinician: Referring Payton Moder: Vernie Murders Treating Jameson Tormey/Extender: Skipper Cliche in Treatment: 1 Encounter Discharge Information Items Post Procedure Vitals Discharge Condition: Stable Temperature (F): 97.9 Ambulatory Status: Ambulatory Pulse (bpm): 69 Discharge Destination: Home Respiratory Rate (breaths/min): 18 Transportation: Private Auto Blood Pressure (mmHg): 138/82 Accompanied By: self Schedule Follow-up Appointment: Yes Clinical Summary of Care: Electronic Signature(s) Signed: 09/22/2020 5:31:07 PM By: Jeanine Luz Entered By: Jeanine Luz on 09/22/2020 10:40:21 Henry Hill ([id number]) -------------------------------------------------------------------------------- Lower Extremity Assessment Details Patient Name: Henry Hill. Date of Service: 09/22/2020 9:30 AM Medical Record Number: [id number] Patient Account Number: [id number] Date of Birth/Sex: [birth year] (60 y.o. M) Treating RN: Carlene Coria Primary Care Jesika Men: Vernie Murders Other Clinician: Referring Jayzen Paver: Vernie Murders Treating Saretta Dahlem/Extender: Skipper Cliche in Treatment: 1 Edema Assessment Assessed: [Left: No] [Right: No] Edema: [Left: Ye]  [Right: s] Calf Left: Right: Point of Measurement: 31 cm From Medial Instep 32 cm Ankle Left: Right: Point of Measurement: 12 cm From Medial Instep 22 cm Vascular Assessment Pulses: Dorsalis Pedis Palpable: [Left:Yes] Electronic Signature(s) Signed: 09/22/2020 8:20:21 PM By: Carlene Coria RN Entered By: Carlene Coria on 09/22/2020 10:00:59 Henry Hill ([id number]) -------------------------------------------------------------------------------- Multi Wound Chart Details Patient Name: Henry Hill. Date of Service: 09/22/2020 9:30 AM Medical Record Number: [id number] Patient Account Number: [id number] Date of Birth/Sex: 1961/05/10 (60 y.o. M) Treating RN: Donnamarie Poag Primary Care Sharie Amorin: Vernie Murders Other Clinician: Referring Tiki Tucciarone: Vernie Murders Treating Sandrina Heaton/Extender: Skipper Cliche in Treatment: 1 Vital Signs Height(in): 72 Pulse(bpm): 69 Weight(lbs): 170 Blood Pressure(mmHg): 138/82 Body Mass Index(BMI): 23 Temperature(F): 97.9 Respiratory Rate(breaths/min): 18 Photos: [N/A:N/A] Wound Location: Left, Medial Lower Leg N/A N/A Wounding Event: Gradually Appeared N/A N/A Primary Etiology: Venous Leg Ulcer N/A N/A Comorbid History: Arrhythmia, Hypertension N/A N/A Date Acquired: 08/09/2019 N/A N/A Weeks of Treatment: 1 N/A N/A Wound Status: Open N/A N/A Measurements L x W x D (cm) 2x0.9x0.2 N/A N/A Area (cm) : 1.414 N/A N/A Volume (cm) : 0.283 N/A N/A % Reduction in Area: 53.80% N/A N/A % Reduction in Volume: 7.50% N/A N/A Classification: Full Thickness Without Exposed N/A N/A Support Structures Exudate Amount: Medium N/A N/A Exudate Type: Serous N/A N/A Exudate Color: amber N/A N/A Granulation Amount: Small (1-33%) N/A N/A Granulation Quality: Pink N/A N/A Necrotic Amount: Large (67-100%) N/A N/A Necrotic Tissue: Eschar N/A N/A Exposed Structures: Fat Layer (Subcutaneous Tissue): N/A N/A Yes Fascia: No Tendon: No Muscle: No Joint:  No Bone: No Epithelialization: None N/A N/A Treatment Notes Electronic Signature(s) Signed: 09/23/2020 2:35:55 PM By: Donnamarie Poag Entered By: Donnamarie Poag on 09/22/2020 10:11:20 Henry Hill ([id number]) -------------------------------------------------------------------------------- [place] Details Patient Name: Henry Hill, Henry Hill. Date of Service: 09/22/2020 9:30 AM Medical Record Number: [id number] Patient Account Number: [id number] Date of Birth/Sex: 23-Jan-1961 (60 y.o. M) Treating RN: Donnamarie Poag Primary Care Brynli Ollis: Vernie Murders Other Clinician: Referring Paelyn Smick: Vernie Murders Treating Evonte Prestage/Extender: Skipper Cliche in Treatment: 1 Active Inactive Wound/Skin Impairment Nursing Diagnoses: Impaired tissue integrity Knowledge deficit related to ulceration/compromised skin integrity Goals: Patient/caregiver will verbalize understanding of skin  care regimen Date Initiated: 09/15/2020 Date Inactivated: 09/22/2020 Target Resolution Date: 10/17/2020 Goal Status: Met Ulcer/skin breakdown will have a volume reduction of 30% by week 4 Date Initiated: 09/15/2020 Target Resolution Date: 10/13/2020 Goal Status: Active Ulcer/skin breakdown will have a volume reduction of 50% by week 8 Date Initiated: 09/15/2020 Target Resolution Date: 11/10/2020 Goal Status: Active Interventions: Assess patient/caregiver ability to obtain necessary supplies Assess patient/caregiver ability to perform ulcer/skin care regimen upon admission and as needed Assess ulceration(s) every visit Treatment Activities: Skin care regimen initiated : 09/15/2020 Notes: Electronic Signature(s) Signed: 09/23/2020 2:35:55 PM By: Donnamarie Poag Entered By: Donnamarie Poag on 09/22/2020 10:10:55 Henry Hill ([id number]) -------------------------------------------------------------------------------- Pain Assessment Details Patient Name: Henry Hill. Date of Service: 09/22/2020 9:30 AM Medical  Record Number: [id number] Patient Account Number: [id number] Date of Birth/Sex: 21-Apr-1961 (60 y.o. M) Treating RN: Carlene Coria Primary Care Giovanie Lefebre: Vernie Murders Other Clinician: Referring Evelisse Szalkowski: Vernie Murders Treating Eneida Evers/Extender: Skipper Cliche in Treatment: 1 Active Problems Location of Pain Severity and Description of Pain Patient Has Paino No Site Locations Pain Management and Medication Current Pain Management: Electronic Signature(s) Signed: 09/22/2020 8:20:21 PM By: Carlene Coria RN Entered By: Carlene Coria on 09/22/2020 09:54:54 Henry Hill ([id number]) -------------------------------------------------------------------------------- Patient/Caregiver Education Details Patient Name: Henry Hill, Henry Hill. Date of Service: 09/22/2020 9:30 AM Medical Record Number: [id number] Patient Account Number: [id number] Date of Birth/Gender: May 03, 1961 (60 y.o. M) Treating RN: Donnamarie Poag Primary Care Physician: Vernie Murders Other Clinician: Referring Physician: Vernie Murders Treating Physician/Extender: Skipper Cliche in Treatment: 1 Education Assessment Education Provided To: Patient Education Topics Provided Basic Hygiene: Methods: Explain/Verbal Wound/Skin Impairment: Methods: Explain/Verbal Responses: State content correctly Electronic Signature(s) Signed: 09/23/2020 2:35:55 PM By: Donnamarie Poag Entered By: Donnamarie Poag on 09/22/2020 10:11:43 Henry Hill ([id number]) -------------------------------------------------------------------------------- Wound Assessment Details Patient Name: Henry Hill, Henry Hill. Date of Service: 09/22/2020 9:30 AM Medical Record Number: [id number] Patient Account Number: [id number] Date of Birth/Sex: 09-19-1960 (60 y.o. M) Treating RN: Carlene Coria Primary Care Anuj Summons: Vernie Murders Other Clinician: Referring Kemberly Taves: Vernie Murders Treating Kirstie Larsen/Extender: Skipper Cliche in Treatment: 1 Wound Status Wound  Number: 1 Primary Etiology: Venous Leg Ulcer Wound Location: Left, Medial Lower Leg Wound Status: Open Wounding Event: Gradually Appeared Comorbid History: Arrhythmia, Hypertension Date Acquired: 08/09/2019 Weeks Of Treatment: 1 Clustered Wound: No Photos Wound Measurements Length: (cm) 2 Width: (cm) 0.9 Depth: (cm) 0.2 Area: (cm) 1.414 Volume: (cm) 0.283 % Reduction in Area: 53.8% % Reduction in Volume: 7.5% Epithelialization: None Tunneling: No Undermining: No Wound Description Classification: Full Thickness Without Exposed Support Structures Exudate Amount: Medium Exudate Type: Serous Exudate Color: amber Foul Odor After Cleansing: No Slough/Fibrino Yes Wound Bed Granulation Amount: Small (1-33%) Exposed Structure Granulation Quality: Pink Fascia Exposed: No Necrotic Amount: Large (67-100%) Fat Layer (Subcutaneous Tissue) Exposed: Yes Necrotic Quality: Eschar Tendon Exposed: No Muscle Exposed: No Joint Exposed: No Bone Exposed: No Treatment Notes Wound #1 (Lower Leg) Wound Laterality: Left, Medial Cleanser Soap and Water Discharge Instruction: Gently cleanse wound with antibacterial soap, rinse and pat dry prior to dressing wounds Peri-Wound Care FLEMON, KELTY ([id number]) Topical Primary Dressing Prisma 4.34 (in) Discharge Instruction: Moisten w/normal saline or sterile water; Cover wound as directed. Do not remove from wound bed. Secondary Dressing Mepilex Border Flex, 4x4 (in/in) Discharge Instruction: Apply to wound as directed. Do not cut. Secured With Compression Wrap Compression Stockings Environmental education officer) Signed: 09/22/2020 8:20:21 PM By: Carlene Coria RN Entered By: Carlene Coria on 09/22/2020 09:59:49 Henry Hill ([id number]) --------------------------------------------------------------------------------  [place] Details Patient Name: Henry Hill, Henry Hill. Date of Service: 09/22/2020 9:30 AM Medical Record Number: [id number] Patient  Account Number: [id number] Date of Birth/Sex: [birth year] (60 y.o. M) Treating RN: Carlene Coria Primary Care Carlin Mamone: Vernie Murders Other Clinician: Referring Dalessandro Baldyga: Vernie Murders Treating Laysha Childers/Extender: Skipper Cliche in Treatment: 1 Vital Signs Time Taken: 09:55 Temperature (F): 97.9 Height (in): 72 Pulse (bpm): 69 Weight (lbs): 170 Respiratory Rate (breaths/min): 18 Body Mass Index (BMI): 23.1 Blood Pressure (mmHg): 138/82 Reference Range: 80 - 120 mg / dl Electronic Signature(s) Signed: 09/22/2020 8:20:21 PM By: Carlene Coria RN Entered By: Carlene Coria on 09/22/2020 09:54:47

## 2020-09-25 ENCOUNTER — Other Ambulatory Visit: Payer: Self-pay | Admitting: Family Medicine

## 2020-09-25 ENCOUNTER — Encounter: Payer: Self-pay | Admitting: Nurse Practitioner

## 2020-09-25 ENCOUNTER — Ambulatory Visit (INDEPENDENT_AMBULATORY_CARE_PROVIDER_SITE_OTHER): Payer: 59 | Admitting: Nurse Practitioner

## 2020-09-25 ENCOUNTER — Other Ambulatory Visit: Payer: Self-pay

## 2020-09-25 VITALS — BP 142/72 | HR 73 | Ht 72.0 in | Wt 173.0 lb

## 2020-09-25 DIAGNOSIS — E876 Hypokalemia: Secondary | ICD-10-CM

## 2020-09-25 DIAGNOSIS — I739 Peripheral vascular disease, unspecified: Secondary | ICD-10-CM

## 2020-09-25 DIAGNOSIS — I480 Paroxysmal atrial fibrillation: Secondary | ICD-10-CM | POA: Diagnosis not present

## 2020-09-25 DIAGNOSIS — F1010 Alcohol abuse, uncomplicated: Secondary | ICD-10-CM | POA: Diagnosis not present

## 2020-09-25 DIAGNOSIS — I10 Essential (primary) hypertension: Secondary | ICD-10-CM

## 2020-09-25 DIAGNOSIS — Z720 Tobacco use: Secondary | ICD-10-CM | POA: Diagnosis not present

## 2020-09-25 DIAGNOSIS — R0683 Snoring: Secondary | ICD-10-CM

## 2020-09-25 DIAGNOSIS — I248 Other forms of acute ischemic heart disease: Secondary | ICD-10-CM

## 2020-09-25 DIAGNOSIS — I2489 Other forms of acute ischemic heart disease: Secondary | ICD-10-CM

## 2020-09-25 MED ORDER — METOPROLOL TARTRATE 50 MG PO TABS: 75.0000 mg | ORAL_TABLET | Freq: Two times a day (BID) | ORAL | 2 refills | Status: DC

## 2020-09-25 NOTE — Progress Notes (Signed)
STOP-BANG Rex assessment  S-snore  Have you been told that you snore?   Yes  T-tired Are you often tired, fatigued, sleepy during the day?   No  O-obstruction Do you stop breathing, choke, or gasp during sleep?   Yes  P-pressure Do you have or are you being treated for high blood pressure?  Yes  B- BMI Is your BMI greater than 35 kg/m?  No  A- age Are you 14 years or older?  Yes  N-neck Do you have a neck circumference greater than 16 inches?  No  G-gender Are you male?   Yes  Total  5   Risk Score <3 Low risk of OSA ?3 High risk of OSA  Clinical symptoms (at least 2) Excessive daytime sleepiness G 47.10 GERD K 21.9 Nocturia R 35.1 Morning headaches G 44.221 Difficulty concentrating R 41.840 Memory problems or poor judgment G 31.84 Personality changes or irritability R 45.4 Loud snoring R0 6.83 Depression F 32.9 Unrefreshed by sleep G 47.8 Impotence N 52.9 History of high blood pressure R03.0 Insomnia G 47.00

## 2020-09-25 NOTE — Patient Instructions (Addendum)
Medication Instructions:  Your physician has recommended you make the following change in your medication:   1. INCREASE Metoprolol tartrate 50 mg take one and one half tablet (75 mg) Twice a day 2. STOP Diltiazem   *If you need a refill on your cardiac medications before your next appointment, please call your pharmacy*   Lab Work: BMET, CBC, Mag  If you have labs (blood work) drawn today and your tests are completely normal, you will receive your results only by: Marland Kitchen MyChart Message (if you have MyChart) OR . A paper copy in the mail If you have any lab test that is abnormal or we need to change your treatment, we will call you to review the results.   Testing/Procedures: Patrick  Your caregiver has ordered a Stress Test with nuclear imaging.   The purpose of this test is to evaluate the blood supply to your heart muscle. This procedure is referred to as a "Non-Invasive Stress Test." This is because other than having an IV started in your vein, nothing is inserted or "invades" your body. Cardiac stress tests are done to find areas of poor blood flow to the heart by determining the extent of coronary artery disease (CAD). Some patients exercise on a treadmill, which naturally increases the blood flow to your heart, while others who are  unable to walk on a treadmill due to physical limitations have a pharmacologic/chemical stress agent called Lexiscan . This medicine will mimic walking on a treadmill by temporarily increasing your coronary blood flow.   Please note: these test may take anywhere between 2-4 hours to complete  PLEASE REPORT TO [place] AT THE FIRST DESK WILL DIRECT YOU WHERE TO GO  Date of Procedure:_____________________________________  Arrival Time for Procedure:______________________________   PLEASE NOTIFY THE OFFICE AT LEAST 24 HOURS IN ADVANCE IF YOU ARE UNABLE TO KEEP YOUR APPOINTMENT.  [phone] AND  PLEASE NOTIFY  [facility] AT [facility] AT LEAST 24 HOURS IN ADVANCE IF YOU ARE UNABLE TO KEEP YOUR APPOINTMENT. [phone]  How to prepare for your Myoview test:  1. Do not eat or drink after midnight 2. No caffeine for 24 hours prior to test 3. No smoking 24 hours prior to test. 4. Your medication may be taken with water.  If your doctor stopped a medication because of this test, do not take that medication. 5. Ladies, please do not wear dresses.  Skirts or pants are appropriate. Please wear a short sleeve shirt. 6. No perfume, cologne or lotion. 7. Wear comfortable walking shoes. No heels!   Your physician has requested that you have an ankle brachial index (ABI).   During this test an ultrasound and blood pressure cuff are used to evaluate the arteries that supply the arms and legs with blood. Allow thirty minutes for this exam. There are no restrictions or special instructions.  Your physician has requested that you have a lower or upper extremity arterial duplex.   This test is an ultrasound of the arteries in the legs or arms. It looks at arterial blood flow in the legs and arms. Allow one hour for Lower and Upper Arterial scans. There are no restrictions or special instructions  WatchPAT?  Is a FDA cleared portable home sleep study test that uses a watch and 3 points of contact to monitor 7 different channels, including your heart rate, oxygen saturations, body position, snoring, and chest motion.  The study is easy to use from the comfort  of your own home and accurately detect sleep apnea.  Before bed, you attach the chest sensor, attached the sleep apnea bracelet to your nondominant hand, and attach the finger probe.  After the study, the raw data is downloaded from the watch and scored for apnea events.   For more information: [email or web address]    Follow-Up: At [facility], you and your health needs are our priority.  As part of our continuing mission to provide  you with exceptional heart care, we have created designated Provider Care Teams.  These Care Teams include your primary Cardiologist (physician) and Advanced Practice Providers (APPs -  Physician Assistants and Nurse Practitioners) who all work together to provide you with the care you need, when you need it.  We recommend signing up for the patient portal called "MyChart".  Sign up information is provided on this After Visit Summary.  MyChart is used to connect with patients for Virtual Visits (Telemedicine).  Patients are able to view lab/test results, encounter notes, upcoming appointments, etc.  Non-urgent messages can be sent to your provider as well.   To learn more about what you can do with MyChart, go to [email or web address].    Your next appointment:   1 month(s)  The format for your next appointment:   In Person  Provider:   [name], MD or Murray Hodgkins, NP

## 2020-09-25 NOTE — Progress Notes (Signed)
Office Visit    Patient Name: Henry Hill Date of Encounter: 09/25/2020  Primary Care Provider:  Margo Common, PA-C Primary Cardiologist:  [name], MD  Chief Complaint    60 year old male with a history of hypertension, tobacco abuse, alcohol use, probable sleep apnea, and varicose veins, who presents for follow-up after recent hospitalization for abdominal pain with finding of bowel obstruction/cecal volvulus requiring exploratory laparotomy and right colectomy on April 23, with subsequent development of atrial fibrillation.  Past Medical History    Past Medical History:  Diagnosis Date  . 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 08/2020 s/p ex lap & R colectomy.  . Diastolic dysfunction    a 08/2020 Echo: EF 50-55%, no rwma, Gr1 DD, nl RV size/fxn, triv MR.  . Essential hypertension   . ETOH abuse   . Persistent atrial fibrillation ([place])    a. Dx 08/2020 in setting of cecal volvulus s/p R colectomy.  CHA2DS@VASc  = 1.  . Tobacco abuse   . Varicose veins of both lower extremities    Past Surgical History:  Procedure Laterality Date  . BACK SURGERY  2003  . HEMORRHOID SURGERY N/A 11/13/2014   Procedure: LEFT LATERAL HEMORRHOIDECTOMY  ;  Surgeon: [name], MD;  Location: [facility];  Service: General;  Laterality: N/A;  . LAPAROTOMY Right 08/30/2020   Procedure: EXPLORATORY LAPAROTOMY WITH RIGHT COLOCTOMY;  Surgeon: [name], MD;  Location: [facility] ORS;  Service: General;  Laterality: Right;    Allergies  No Known Allergies  History of Present Illness    60 year old male with the above past medical history including hypertension, tobacco abuse, alcohol use, probable sleep apnea, and varicose veins.  He presented to [facility] on April 23 with a 2-day history of abdominal pain, nausea, vomiting, and anorexia.  He was found on CT to have high-grade bowel obstruction and cecal volvulus.  He was also incidentally noted to have aortic atherosclerosis.  In the setting of  several days of symptoms, his creatinine was elevated at 1.8 with a lactate of 4.3, sodium of 125, and WBC of 11.8.  Admission ECG was notable for sinus tachycardia 134 bpm with prior anterior infarct.  Troponin was mildly elevated with a flat trend of 33  36  20.  He underwent exploratory laparotomy with right colectomy on April 23.  Postoperatively, he developed atrial fibrillation with rates into the 140s associate with inferolateral ST depression.  He was initially managed with IV diltiazem which was subsequently transitioned to oral diltiazem and metoprolol.  Echocardiogram showed low normal EF at 50-55% without regional wall motion abnormalities and grade 1 diastolic dysfunction.  He was placed on Eliquis 5 mg twice daily.  He converted to sinus rhythm in April 29.  We did have to back down on his diltiazem dose secondary to soft blood pressures.  He was discharged home on May 1 on metoprolol 50 mg twice daily, diltiazem CD1 180 mg daily, and Eliquis 5 mg twice daily.  We advise outpatient follow-up, stress testing, sleep study, and ABIs, as he reported bilateral lower extremity/calf claudication during his hospitalization.  Since discharge, he has been feeling well.  He has not had any recurrent abdominal discomfort and denies chest pain, palpitations, or dyspnea.  He has been taking it easy and sitting more than usual.  In that setting, he has noted increased left greater than right dependent edema.  He feels that diltiazem may be playing a role in this and would like  to stop it.  He continues to smoke a half a pack a day.  He continues to have a few beers every night.  He continues to note bilateral calf claudication.  He denies PND, orthopnea, dizziness, syncope, or early satiety.  Home Medications    Prior to Admission medications   Medication Sig Start Date End Date Taking? Authorizing Provider  acetaminophen (TYLENOL) 500 MG tablet Take 1,000 mg by mouth every 6 (six) hours as needed for mild  pain, moderate pain or headache.    [provider]  apixaban (ELIQUIS) 5 MG TABS tablet Take 1 tablet (5 mg total) by mouth 2 (two) times daily. 09/07/20   Theora Gianotti, NP  Control Gel Formula Dressing (DUODERM CGF DRESSING) MISC Apply patch to ulcer on left ankle and change once a week. 01/28/20   Chrismon, Vickki Muff, PA-C  diltiazem (CARDIZEM CD) 180 MG 24 hr capsule Take 1 capsule (180 mg total) by mouth daily. 09/08/20   Theora Gianotti, NP  metoprolol tartrate (LOPRESSOR) 50 MG tablet Take 1 tablet (50 mg total) by mouth 2 (two) times daily. 09/07/20   Theora Gianotti, NP  Multiple Vitamins-Minerals (MULTIVITAMIN ADULT PO) Take 1 tablet by mouth daily.    [provider]  sildenafil (VIAGRA) 50 MG tablet TAKE 1 TABLET BY MOUTH DAILY AS NEEDED FOR ERECTILE DYSFUNCTION 07/31/20   Chrismon, Vickki Muff, PA-C    Review of Systems    He has been noting left greater than right lower extremity edema at the end of the day since hospitalization.  He has a prolonged history of bilateral lower extremity/calf claudication.  He denies chest pain, dyspnea, palpitations, PND, orthopnea, dizziness, syncope, or early satiety..  All other systems reviewed and are otherwise negative except as noted above.  Physical Exam    VS:  BP (!) 142/72   Pulse 73   Ht 6' (1.829 m)   Wt 173 lb (78.5 kg)   BMI 23.46 kg/m  , BMI Body mass index is 23.46 kg/m. GEN: Well nourished, well developed, in no acute distress. HEENT: normal. Neck: Supple, no JVD, carotid bruits, or masses. Cardiac: RRR, no murmurs, rubs, or gallops. No clubbing, cyanosis, edema.  Diminished distal pulses bilaterally. Respiratory:  Respirations regular and unlabored, clear to auscultation bilaterally. GI: Soft, nontender, nondistended, BS + x 4. MS: no deformity or atrophy. Skin: warm and dry, no rash. Neuro:  Strength and sensation are intact. Psych: Normal affect.  Accessory Clinical Findings     ECG personally reviewed by me today -regular sinus rhythm, 73, LVH- no acute changes.  Lab Results  Component Value Date   WBC 12.1 (H) 09/07/2020   HGB 11.5 (L) 09/07/2020   HCT 32.3 (L) 09/07/2020   MCV 93.9 09/07/2020   PLT 342 09/07/2020   Lab Results  Component Value Date   CREATININE 0.71 09/07/2020   BUN 24 (H) 09/07/2020   NA 128 (L) 09/07/2020   K 3.1 (L) 09/07/2020   CL 91 (L) 09/07/2020   CO2 27 09/07/2020   Lab Results  Component Value Date   ALT 28 09/01/2020   AST 19 09/01/2020   ALKPHOS 27 (L) 09/01/2020   BILITOT 0.8 09/01/2020   Lab Results  Component Value Date   CHOL 151 07/03/2019   HDL 100 07/03/2019   LDLCALC 42 07/03/2019   TRIG 48 09/01/2020   CHOLHDL 1.5 07/03/2019    Lab Results  Component Value Date   HGBA1C 5.4 08/31/2020  Assessment & Plan    1.  Paroxysmal atrial fibrillation: Patient developed atrial fibrillation following bowel surgery in late April.  He converted on beta-blocker and diltiazem therapy.  Echo showed low normal LV function with an EF of 51-76%, grade 1 diastolic dysfunction, and mild concentric LVH.  He is maintaining sinus rhythm today.  He feels that diltiazem may be contributing to lower extremity swelling and would like to come off of this.  We will DC diltiazem and titrate metoprolol to 75 mg twice daily.  Continue Eliquis.  Follow-up CBC today.  Follow-up sleep study.  2.  Demand ischemia: Patient with mild troponin elevation in setting of A. fib, to a peak of 36.  ECG also notable for variable degrees of ST depression in the setting of LVH and tachycardia.  He has no prior history of chest pain.  CT of the abdomen showed aortic atherosclerosis.  Echo showed low normal LV function with an EF of 50-55%.  We will arrange for a Lexiscan Myoview for risk stratification, especially as he might require antiarrhythmic therapy someday for recurrent A. fib.  Continue beta-blocker.  No aspirin in the setting of Eliquis.  He is  not on a statin as his LDL was 42 in February.  3.  Essential hypertension: Blood pressure elevated today at 142/72.  He has a cuff at home but does not use it.  Stopping diltiazem in the setting of concern related to lower extremity swelling and will uptitrate metoprolol to 75 mg twice daily.  I have encouraged him to check his blood pressure at home regularly.  4.  Tobacco and alcohol abuse: Patient continues to smoke half pack cigarettes per day and is drinking a few beers at night.  Complete cessation advised.  We had a prolonged discussion during his hospitalization and I reiterated this today.  5. ?  Obstructive sleep apnea/snoring: Per wife, he snores loudly.  With recent finding of A. fib, will arrange for sleep study.  Stop bang equals 5.  6.  Claudication: Patient with bilateral calf claudication over the past several years.  He reported this to [place] during his hospitalization.  He has diminished distal pulses.  We will arrange for ABI.  7.  Hypokalemia: Potassium was 3.1 on May 1.  Follow-up basic metabolic panel and magnesium today.  8.  Bowel obstruction/cecal volvulus: Followed closely by surgery.  Doing well.  9.  Disposition: Follow-up lab work today, sleep study, Lexiscan Myoview, and ABIs.  Office follow-up in approximately 1 month.   Murray Hodgkins, NP 09/25/2020, 12:08 PM

## 2020-09-26 ENCOUNTER — Telehealth: Payer: Self-pay | Admitting: *Deleted

## 2020-09-26 LAB — CBC
Hematocrit: 35.5 % — ABNORMAL LOW (ref 37.5–51.0)
Hemoglobin: 12.1 g/dL — ABNORMAL LOW (ref 13.0–17.7)
MCH: 32.1 pg (ref 26.6–33.0)
MCHC: 34.1 g/dL (ref 31.5–35.7)
MCV: 94 fL (ref 79–97)
Platelets: 344 10*3/uL (ref 150–450)
RBC: 3.77 x10E6/uL — ABNORMAL LOW (ref 4.14–5.80)
RDW: 12.2 % (ref 11.6–15.4)
WBC: 11 10*3/uL — ABNORMAL HIGH (ref 3.4–10.8)

## 2020-09-26 LAB — BASIC METABOLIC PANEL
BUN/Creatinine Ratio: 17 (ref 10–24)
BUN: 11 mg/dL (ref 8–27)
CO2: 19 mmol/L — ABNORMAL LOW (ref 20–29)
Calcium: 8.9 mg/dL (ref 8.6–10.2)
Chloride: 101 mmol/L (ref 96–106)
Creatinine, Ser: 0.63 mg/dL — ABNORMAL LOW (ref 0.76–1.27)
Glucose: 86 mg/dL (ref 65–99)
Potassium: 4.2 mmol/L (ref 3.5–5.2)
Sodium: 137 mmol/L (ref 134–144)
eGFR: 109 mL/min/{1.73_m2} (ref >59)

## 2020-09-26 LAB — MAGNESIUM: Magnesium: 2 mg/dL (ref 1.6–2.3)

## 2020-09-26 NOTE — Telephone Encounter (Signed)
Reviewed results and recommendations with patient and he verbalized understanding with no further questions at this time.  

## 2020-09-26 NOTE — Telephone Encounter (Signed)
Left voicemail message for patient to call back for review of results  

## 2020-09-26 NOTE — Telephone Encounter (Signed)
-----   Message from Theora Gianotti, NP sent at 09/26/2020  7:35 AM EDT ----- Blood counts stable w/ ongoing mild elevation of wbc's.  Lytes and kidney fxn look good.

## 2020-09-26 NOTE — Telephone Encounter (Signed)
Left voicemail message on spouse number per release form to call back for review of results.

## 2020-09-29 ENCOUNTER — Telehealth: Payer: Self-pay

## 2020-09-29 ENCOUNTER — Other Ambulatory Visit: Payer: Self-pay

## 2020-09-29 ENCOUNTER — Encounter (INDEPENDENT_AMBULATORY_CARE_PROVIDER_SITE_OTHER): Payer: 59 | Admitting: Cardiology

## 2020-09-29 ENCOUNTER — Telehealth: Payer: Self-pay | Admitting: *Deleted

## 2020-09-29 ENCOUNTER — Encounter: Payer: 59 | Admitting: Internal Medicine

## 2020-09-29 DIAGNOSIS — G4733 Obstructive sleep apnea (adult) (pediatric): Secondary | ICD-10-CM

## 2020-09-29 DIAGNOSIS — L97828 Non-pressure chronic ulcer of other part of left lower leg with other specified severity: Secondary | ICD-10-CM | POA: Diagnosis not present

## 2020-09-29 NOTE — Telephone Encounter (Signed)
Reviewed that he could proceed with his sleep study and pin number would be [phone]. He did inquire if he did not do tonight then he would do it tomorrow night. Advised that would be fine and to call back if he had any further questions.

## 2020-09-29 NOTE — Telephone Encounter (Signed)
-----   Message from Chapman Moss, RN sent at 09/29/2020  4:15 PM EDT ----- Regarding: RE: Henry Hill Per [employer] Prior authorization is not required. [id number].  Please notify patient to proceed with Itamar study.  Thank you ----- Message ----- From: Valora Corporal, RN Sent: 09/25/2020   3:24 PM EDT To: [facility] Subject: Henry Hill                                      From: Valora Corporal, RN On: 09/25/2020 02:01 PM To: [facility] (Pool) Priority: Routine Routing Comments: Please precert WatchPat for this patient. Enrolled online.   StopBang 5

## 2020-09-29 NOTE — Telephone Encounter (Signed)
Per [employer] Prior authorization is not required. [id number].  Message to Pam A. Requesting call to patient to proceed with testing.

## 2020-10-01 NOTE — Progress Notes (Signed)
Henry, Hill ([id number]) Visit Report for 09/29/2020 HPI Details Patient Name: Henry Hill, Henry Hill. Date of Service: 09/29/2020 8:15 AM Medical Record Number: [id number] Patient Account Number: [id number] Date of Birth/Sex: 03/13/1961 (60 y.o. M) Treating RN: Primary Care Provider: Vernie Murders Other Clinician: Referring Provider: Vernie Murders Treating Provider/Extender: Tito Dine in Treatment: 2 History of Present Illness HPI Description: 09/15/2020 upon evaluation today patient appears to be doing somewhat poorly in regard to the wound that he tells me has been dealing with for about a year. Has been working with his primary care provider to try to help get this wound healed. He comes in today as he states that they had "failed" and getting this to completely close. Fortunately there does not appear to be any signs of active infection he did have a lot of eschar and dried drainage on the surface of the wound area. With that being said we were able to carefully remove this today I did not even have to perform debridement. This appears to be doing quite well once removed. He does have a history of chronic venous insufficiency, peripheral vascular disease, atrial fibrillation, long-term use of anticoagulant therapy which has recently been started with Eliquis due to the fact that he was diagnosed with A. fib when he was in the hospital due to a bowel obstruction. He also has hypertension. 09/22/2020 upon evaluation today patient appears to be doing well with regard to his wound. He has been tolerating the dressing changes without complication. Fortunately there is no signs of active infection at this time. No fevers, chills, nausea, vomiting, or diarrhea. 5/23; patient with a wound on the left medial lower extremity. History of significant chronic venous insufficiency apparently has had procedures done by Dr. Lucky Cowboy he also had an ABI in this clinic of 0.59 he has limiting claudication  although I could not quantify this. He is going for noninvasive arterial studies but this is being delayed as he apparently is having a cardiac stress test related to recent atrial fibrillation. He is using collagen Electronic Signature(s) Signed: 09/29/2020 3:42:13 PM By: Linton Ham MD Entered By: Linton Ham on 09/29/2020 08:50:02 [name] ([id number]) -------------------------------------------------------------------------------- Physical Exam Details Patient Name: Henry, Hill. Date of Service: 09/29/2020 8:15 AM Medical Record Number: [id number] Patient Account Number: [id number] Date of Birth/Sex: 1960/08/09 (60 y.o. M) Treating RN: Primary Care Provider: Vernie Murders Other Clinician: Referring Provider: Vernie Murders Treating Provider/Extender: Tito Dine in Treatment: 2 Constitutional Patient is hypertensive. It was repeated lower still elevated however. Pulse regular and within target range for patient.Marland Kitchen Respirations regular, non- labored and within target range.. Temperature is normal and within the target range for the patient.Marland Kitchen appears in no distress. Cardiovascular Pedal pulses are absent on the left. Popliteal is absent on the left. Signs of chronic venous insufficiency with varicosities. Not much in the way of edema. Notes Wound exam; the patient's wound is on the left medial lower extremity. It has some surface debris I did not debride this. Most of this looks close to closing however. Electronic Signature(s) Signed: 09/29/2020 3:42:13 PM By: Linton Ham MD Entered By: Linton Ham on 09/29/2020 08:51:39 [name] ([id number]) -------------------------------------------------------------------------------- Physician Orders Details Patient Name: Henry, Hill. Date of Service: 09/29/2020 8:15 AM Medical Record Number: [id number] Patient Account Number: [id number] Date of Birth/Sex: 1960/10/28 (60 y.o. M) Treating RN: Cornell Barman Primary Care Provider: Vernie Murders Other Clinician: Referring Provider: Vernie Murders Treating Provider/Extender: Tito Dine  in Treatment: 2 Verbal / Phone Orders: No Diagnosis Coding Follow-up Appointments Wound #1 Left,Medial Lower Leg o Return Appointment in 1 week. Bathing/ Shower/ Hygiene o May shower with wound dressing protected with water repellent cover or cast protector. - change dressing immediately after shower if becomes wet Wound Treatment Wound #1 - Lower Leg Wound Laterality: Left, Medial Cleanser: Soap and Water 3 x Per Week/30 Days Discharge Instructions: Gently cleanse wound with antibacterial soap, rinse and pat dry prior to dressing wounds Primary Dressing: Prisma 4.34 (in) (Generic) 3 x Per Week/30 Days Discharge Instructions: Moisten w/normal saline or sterile water; Cover wound as directed. Do not remove from wound bed. Secondary Dressing: Mepilex Border Flex, 4x4 (in/in) (Generic) 3 x Per Week/30 Days Discharge Instructions: Apply to wound as directed. Do not cut. Electronic Signature(s) Signed: 09/29/2020 3:42:13 PM By: Linton Ham MD Signed: 09/30/2020 5:46:47 PM By: Gretta Cool BSN, RN, CWS, Kim RN, BSN Entered By: Gretta Cool, BSN, RN, CWS, Kim on 09/29/2020 08:45:25 [name] ([id number]) -------------------------------------------------------------------------------- Problem List Details Patient Name: Henry, Hill. Date of Service: 09/29/2020 8:15 AM Medical Record Number: [id number] Patient Account Number: [id number] Date of Birth/Sex: [birth year] (60 y.o. M) Treating RN: Primary Care Provider: Vernie Murders Other Clinician: Referring Provider: Vernie Murders Treating Provider/Extender: Tito Dine in Treatment: 2 Active Problems ICD-10 Encounter Code Description Active Date MDM Diagnosis I87.2 Venous insufficiency (chronic) (peripheral) 09/15/2020 No Yes L97.828 Non-pressure chronic ulcer of other part of  left lower leg with other 09/15/2020 No Yes specified severity I48.0 Paroxysmal atrial fibrillation 09/15/2020 No Yes Z79.01 Long term (current) use of anticoagulants 09/15/2020 No Yes I10 Essential (primary) hypertension 09/15/2020 No Yes Inactive Problems Resolved Problems Electronic Signature(s) Signed: 09/29/2020 3:42:13 PM By: Linton Ham MD Entered By: Linton Ham on 09/29/2020 08:48:48 [name] ([id number]) -------------------------------------------------------------------------------- Progress Note Details Patient Name: [name]. Date of Service: 09/29/2020 8:15 AM Medical Record Number: [id number] Patient Account Number: [id number] Date of Birth/Sex: 1960-10-29 (60 y.o. M) Treating RN: Primary Care Provider: Vernie Murders Other Clinician: Referring Provider: Vernie Murders Treating Provider/Extender: Tito Dine in Treatment: 2 Subjective History of Present Illness (HPI) 09/15/2020 upon evaluation today patient appears to be doing somewhat poorly in regard to the wound that he tells me has been dealing with for about a year. Has been working with his primary care provider to try to help get this wound healed. He comes in today as he states that they had "failed" and getting this to completely close. Fortunately there does not appear to be any signs of active infection he did have a lot of eschar and dried drainage on the surface of the wound area. With that being said we were able to carefully remove this today I did not even have to perform debridement. This appears to be doing quite well once removed. He does have a history of chronic venous insufficiency, peripheral vascular disease, atrial fibrillation, long-term use of anticoagulant therapy which has recently been started with Eliquis due to the fact that he was diagnosed with A. fib when he was in the hospital due to a bowel obstruction. He also has hypertension. 09/22/2020 upon evaluation today  patient appears to be doing well with regard to his wound. He has been tolerating the dressing changes without complication. Fortunately there is no signs of active infection at this time. No fevers, chills, nausea, vomiting, or diarrhea. 5/23; patient with a wound on the left medial lower extremity. History of significant chronic venous insufficiency  apparently has had procedures done by Dr. Lucky Cowboy he also had an ABI in this clinic of 0.59 he has limiting claudication although I could not quantify this. He is going for noninvasive arterial studies but this is being delayed as he apparently is having a cardiac stress test related to recent atrial fibrillation. He is using collagen Objective Constitutional Patient is hypertensive. It was repeated lower still elevated however. Pulse regular and within target range for patient.Marland Kitchen Respirations regular, non- labored and within target range.. Temperature is normal and within the target range for the patient.Marland Kitchen appears in no distress. Vitals Time Taken: 8:16 AM, Height: 72 in, Weight: 170 lbs, BMI: 23.1, Temperature: 98.0 F, Pulse: 73 bpm, Respiratory Rate: 18 breaths/min, Blood Pressure: 157/102 mmHg. General Notes: Recheck of BP 156/96; Patient states he just took bp meds. Cardiovascular Pedal pulses are absent on the left. Popliteal is absent on the left. Signs of chronic venous insufficiency with varicosities. Not much in the way of edema. General Notes: Wound exam; the patient's wound is on the left medial lower extremity. It has some surface debris I did not debride this. Most of this looks close to closing however. Integumentary (Hair, Skin) Wound #1 status is Open. Original cause of wound was Gradually Appeared. The date acquired was: 08/09/2019. The wound has been in treatment 2 weeks. The wound is located on the Left,Medial Lower Leg. The wound measures 1.5cm length x 0.7cm width x 0.1cm depth; 0.825cm^2 area and 0.082cm^3 volume. There is Fat  Layer (Subcutaneous Tissue) exposed. There is no tunneling or undermining noted. There is a small amount of serous drainage noted. There is small (1-33%) pink granulation within the wound bed. There is a medium (34-66%) amount of necrotic tissue within the wound bed including Adherent Slough. Assessment Active Problems ICD-10 Venous insufficiency (chronic) (peripheral) Non-pressure chronic ulcer of other part of left lower leg with other specified severity Paroxysmal atrial fibrillation Long term (current) use of anticoagulants Essential (primary) hypertension CAP, MASSI. ([id number]) Plan Follow-up Appointments: Wound #1 Left,Medial Lower Leg: Return Appointment in 1 week. Bathing/ Shower/ Hygiene: May shower with wound dressing protected with water repellent cover or cast protector. - change dressing immediately after shower if becomes wet WOUND #1: - Lower Leg Wound Laterality: Left, Medial Cleanser: Soap and Water 3 x Per Week/30 Days Discharge Instructions: Gently cleanse wound with antibacterial soap, rinse and pat dry prior to dressing wounds Primary Dressing: Prisma 4.34 (in) (Generic) 3 x Per Week/30 Days Discharge Instructions: Moisten w/normal saline or sterile water; Cover wound as directed. Do not remove from wound bed. Secondary Dressing: Mepilex Border Flex, 4x4 (in/in) (Generic) 3 x Per Week/30 Days Discharge Instructions: Apply to wound as directed. Do not cut. 1. Continue Prisma with a border 2. The patient's arterial studies are scheduled at some point although not clear when that is. Electronic Signature(s) Signed: 09/29/2020 3:42:13 PM By: Linton Ham MD Entered By: Linton Ham on 09/29/2020 08:52:17 [name] ([id number]) -------------------------------------------------------------------------------- SuperBill Details Patient Name: TYMIR, TERRAL. Date of Service: 09/29/2020 Medical Record Number: [id number] Patient Account Number:  [id number] Date of Birth/Sex: 1961-03-15 (60 y.o. M) Treating RN: Primary Care Provider: Vernie Murders Other Clinician: Referring Provider: Vernie Murders Treating Provider/Extender: Tito Dine in Treatment: 2 Diagnosis Coding ICD-10 Codes Code Description I87.2 Venous insufficiency (chronic) (peripheral) L97.828 Non-pressure chronic ulcer of other part of left lower leg with other specified severity I48.0 Paroxysmal atrial fibrillation Z79.01 Long term (current) use of anticoagulants I10 Essential (primary) hypertension  Facility Procedures CPT4 Code: 65784696 Description: 29528 - WOUND CARE VISIT-LEV 3 EST PT Modifier: Quantity: 1 Physician Procedures CPT4 Code: 4132440 Description: 10272 - WC PHYS LEVEL 3 - EST PT Modifier: Quantity: 1 CPT4 Code: Description: ICD-10 Diagnosis Description L97.828 Non-pressure chronic ulcer of other part of left lower leg with other spe I87.2 Venous insufficiency (chronic) (peripheral) Modifier: cified severity Quantity: Electronic Signature(s) Signed: 09/29/2020 3:42:13 PM By: Linton Ham MD Entered By: Linton Ham on 09/29/2020 08:52:35

## 2020-10-02 NOTE — Progress Notes (Signed)
Henry Hill, Henry Hill ([id number]) Visit Report for 09/29/2020 Arrival Information Details Patient Name: Henry Hill, Henry Hill. Date of Service: 09/29/2020 8:15 AM Medical Record Number: [id number] Patient Account Number: [id number] Date of Birth/Sex: February 21, 1961 (60 y.o. M) Treating RN: Primary Care Abrie Egloff: Vernie Murders Other Clinician: Referring Lynard Postlewait: Vernie Murders Treating Maryama Kuriakose/Extender: Tito Dine in Treatment: 2 Visit Information History Since Last Visit Added or deleted any medications: No Patient Arrived: Ambulatory Had a fall or experienced change in No Arrival Time: 08:13 activities of daily living that may affect Accompanied By: wife risk of falls: Transfer Assistance: None Hospitalized since last visit: No Patient Identification Verified: Yes Pain Present Now: No Secondary Verification Process Completed: Yes Patient Requires Transmission-Based No Precautions: Patient Has Alerts: Yes Patient Alerts: Patient on Blood Thinner ELIQUIS NOT A DIABETIC Electronic Signature(s) Signed: 10/02/2020 4:37:07 PM By: Jeanine Luz Entered By: Jeanine Luz on 09/29/2020 08:16:44 Henry Hill ([id number]) -------------------------------------------------------------------------------- Clinic Level of Care Assessment Details Patient Name: Henry Hill Date of Service: 09/29/2020 8:15 AM Medical Record Number: [id number] Patient Account Number: [id number] Date of Birth/Sex: 24-Jun-1960 (60 y.o. M) Treating RN: Cornell Barman Primary Care Kyleeann Cremeans: Vernie Murders Other Clinician: Referring Stanley Helmuth: Vernie Murders Treating Takenya Travaglini/Extender: Tito Dine in Treatment: 2 Clinic Level of Care Assessment Items TOOL 4 Quantity Score _0  - Use when only an EandM is performed on FOLLOW-UP visit 0 ASSESSMENTS - Nursing Assessment / Reassessment X - Reassessment of Co-morbidities (includes updates in patient status) 1 10 X- 1 5 Reassessment of Adherence to  Treatment Plan ASSESSMENTS - Wound and Skin Assessment / Reassessment X - Simple Wound Assessment / Reassessment - one wound 1 5 _1  - 0 Complex Wound Assessment / Reassessment - multiple wounds _2  - 0 Dermatologic / Skin Assessment (not related to wound area) ASSESSMENTS - Focused Assessment _3  - Circumferential Edema Measurements - multi extremities 0 _4  - 0 Nutritional Assessment / Counseling / Intervention _5  - 0 Lower Extremity Assessment (monofilament, tuning fork, pulses) _6  - 0 Peripheral Arterial Disease Assessment (using hand held doppler) ASSESSMENTS - Ostomy and/or Continence Assessment and Care _7  - Incontinence Assessment and Management 0 _8  - 0 Ostomy Care Assessment and Management (repouching, etc.) PROCESS - Coordination of Care X - Simple Patient / Family Education for ongoing care 1 15 _9  - 0 Complex (extensive) Patient / Family Education for ongoing care X- 1 10 Staff obtains Programmer, systems, Records, Test Results / Process Orders _10  - 0 Staff telephones HHA, Nursing Homes / Clarify orders / etc _11  - 0 Routine Transfer to another Facility (non-emergent condition) _12  - 0 Routine Hospital Admission (non-emergent condition) _13  - 0 New Admissions / Biomedical engineer / Ordering NPWT, Apligraf, etc. _14  - 0 Emergency Hospital Admission (emergent condition) X- 1 10 Simple Discharge Coordination _15  - 0 Complex (extensive) Discharge Coordination PROCESS - Special Needs _16  - Pediatric / Minor Patient Management 0 _17  - 0 Isolation Patient Management _18  - 0 Hearing / Language / Visual special needs _19  - 0 Assessment of Community assistance (transportation, D/C planning, etc.) _20  - 0 Additional assistance / Altered mentation _21  - 0 Support Surface(s) Assessment (bed, cushion, seat, etc.) INTERVENTIONS - Wound Cleansing / Measurement Lancon, Bryden C. ([id number]) X- 1 5 Simple Wound Cleansing - one wound _22  - 0 Complex Wound Cleansing - multiple wounds X- 1  5 Wound Imaging (photographs - any number of wounds) _23  - 0 Wound Tracing (instead of photographs) X- 1 5 Simple Wound Measurement - one wound _24  - 0  Complex Wound Measurement - multiple wounds INTERVENTIONS - Wound Dressings _0  - Small Wound Dressing one or multiple wounds 0 X- 1 15 Medium Wound Dressing one or multiple wounds _1  - 0 Large Wound Dressing one or multiple wounds <DJSHFWYOVZCHYIFO>_2<\/DXAJOINOMVEHMCNO>_7  - 0 Application of Medications - topical <SJGGEZMOQHUTMLYY>_5<\/KPTWSFKCLEXNTZGY>_1  - 0 Application of Medications - injection INTERVENTIONS - Miscellaneous _4  - External ear exam 0 _5  - 0 Specimen Collection (cultures, biopsies, blood, body fluids, etc.) _6  - 0 Specimen(s) / Culture(s) sent or taken to Lab for analysis _7  - 0 Patient Transfer (multiple staff / Civil Service fast streamer / Similar devices) _8  - 0 Simple Staple / Suture removal (25 or less) _9  - 0 Complex Staple / Suture removal (26 or more) _10  - 0 Hypo / Hyperglycemic Management (close monitor of Blood Glucose) _11  - 0 Ankle / Brachial Index (ABI) - do not check if billed separately X- 1 5 Vital Signs Has the patient been seen at the hospital within the last three years: Yes Total Score: 90 Level Of Care: New/Established - Level 3 Electronic Signature(s) Signed: 09/30/2020 5:46:47 PM By: Gretta Cool, BSN, RN, CWS, Kim RN, BSN Entered By: Gretta Cool, BSN, RN, CWS, Kim on 09/29/2020 08:45:51 Henry Hill ([id number]) -------------------------------------------------------------------------------- Encounter Discharge Information Details Patient Name: Henry Hill, Henry Hill. Date of Service: 09/29/2020 8:15 AM Medical Record Number: [id number] Patient Account Number: [id number] Date of Birth/Sex: [birth year] (60 y.o. M) Treating RN: Cornell Barman Primary Care Jazen Spraggins: Vernie Murders Other Clinician: Referring Tifani Dack: Vernie Murders Treating Wilfrid Hyser/Extender: Tito Dine in Treatment: 2 Encounter Discharge Information Items Discharge Condition: Stable Ambulatory Status:  Ambulatory Discharge Destination: Home Transportation: Private Auto Accompanied By: wife Schedule Follow-up Appointment: Yes Clinical Summary of Care: Electronic Signature(s) Signed: 09/30/2020 5:46:47 PM By: Gretta Cool, BSN, RN, CWS, Kim RN, BSN Entered By: Gretta Cool, BSN, RN, CWS, Kim on 09/29/2020 08:46:48 Henry Hill ([id number]) -------------------------------------------------------------------------------- Lower Extremity Assessment Details Patient Name: Henry Hill, Henry Hill. Date of Service: 09/29/2020 8:15 AM Medical Record Number: [id number] Patient Account Number: [id number] Date of Birth/Sex: May 08, 1961 (60 y.o. M) Treating RN: Primary Care Arshi Duarte: Vernie Murders Other Clinician: Referring Merrianne Mccumbers: Vernie Murders Treating Bresha Hosack/Extender: Tito Dine in Treatment: 2 Edema Assessment Assessed: [Left: No] [Right: No] [Left: Edema] [Right: :] Calf Left: Right: Point of Measurement: 31 cm From Medial Instep 31.4 cm Ankle Left: Right: Point of Measurement: 12 cm From Medial Instep 21.5 cm Vascular Assessment Pulses: Dorsalis Pedis Palpable: [Left:Yes] Electronic Signature(s) Signed: 10/02/2020 4:37:07 PM By: Jeanine Luz Entered By: Jeanine Luz on 09/29/2020 08:24:50 Henry Hill ([id number]) -------------------------------------------------------------------------------- Multi Wound Chart Details Patient Name: Henry Hill. Date of Service: 09/29/2020 8:15 AM Medical Record Number: [id number] Patient Account Number: [id number] Date of Birth/Sex: 25-May-1960 (60 y.o. M) Treating RN: Cornell Barman Primary Care Geovanny Sartin: Vernie Murders Other Clinician: Referring Gideon Burstein: Vernie Murders Treating Jinx Gilden/Extender: Tito Dine in Treatment: 2 Vital Signs Height(in): 72 Pulse(bpm): 41 Weight(lbs): 170 Blood Pressure(mmHg): 157/102 Body Mass Index(BMI): 23 Temperature(F): 98.0 Respiratory Rate(breaths/min): 18 Photos:  [N/A:N/A] Wound Location: Left, Medial Lower Leg N/A N/A Wounding Event: Gradually Appeared N/A N/A Primary Etiology: Venous Leg Ulcer N/A N/A Comorbid History: Arrhythmia, Hypertension N/A N/A Date Acquired: 08/09/2019 N/A N/A Weeks of Treatment: 2 N/A N/A Wound Status: Open N/A N/A Measurements L x W x D (cm) 1.5x0.7x0.1 N/A N/A Area (cm) : 0.825 N/A N/A Volume (cm) : 0.082 N/A N/A % Reduction in Area: 73.10% N/A N/A % Reduction in Volume: 73.20% N/A N/A Classification: Full Thickness Without Exposed N/A N/A  Support Structures Exudate Amount: Small N/A N/A Exudate Type: Serous N/A N/A Exudate Color: amber N/A N/A Granulation Amount: Small (1-33%) N/A N/A Granulation Quality: Pink N/A N/A Necrotic Amount: Medium (34-66%) N/A N/A Exposed Structures: Fat Layer (Subcutaneous Tissue): N/A N/A Yes Fascia: No Tendon: No Muscle: No Joint: No Bone: No Epithelialization: Small (1-33%) N/A N/A Treatment Notes Wound #1 (Lower Leg) Wound Laterality: Left, Medial Cleanser Soap and Water Discharge Instruction: Gently cleanse wound with antibacterial soap, rinse and pat dry prior to dressing wounds Peri-Wound Care Topical NASIM, HABEEB ([id number]) Primary Dressing Prisma 4.34 (in) Discharge Instruction: Moisten w/normal saline or sterile water; Cover wound as directed. Do not remove from wound bed. Secondary Dressing Mepilex Border Flex, 4x4 (in/in) Discharge Instruction: Apply to wound as directed. Do not cut. Secured With Compression Wrap Compression Stockings Environmental education officer) Signed: 09/29/2020 3:42:13 PM By: Linton Ham MD Entered By: Linton Ham on 09/29/2020 08:48:54 Henry Hill ([id number]) -------------------------------------------------------------------------------- Multi-Disciplinary Care Plan Details Patient Name: Henry Hill, Henry Hill. Date of Service: 09/29/2020 8:15 AM Medical Record Number: [id number] Patient Account Number:  [id number] Date of Birth/Sex: [birth year] (60 y.o. M) Treating RN: Cornell Barman Primary Care Willye Javier: Vernie Murders Other Clinician: Referring Tiburcio Linder: Vernie Murders Treating Sibley Rolison/Extender: Tito Dine in Treatment: 2 Active Inactive Wound/Skin Impairment Nursing Diagnoses: Impaired tissue integrity Knowledge deficit related to ulceration/compromised skin integrity Goals: Patient/caregiver will verbalize understanding of skin care regimen Date Initiated: 09/15/2020 Date Inactivated: 09/22/2020 Target Resolution Date: 10/17/2020 Goal Status: Met Ulcer/skin breakdown will have a volume reduction of 30% by week 4 Date Initiated: 09/15/2020 Target Resolution Date: 10/13/2020 Goal Status: Active Ulcer/skin breakdown will have a volume reduction of 50% by week 8 Date Initiated: 09/15/2020 Target Resolution Date: 11/10/2020 Goal Status: Active Interventions: Assess patient/caregiver ability to obtain necessary supplies Assess patient/caregiver ability to perform ulcer/skin care regimen upon admission and as needed Assess ulceration(s) every visit Treatment Activities: Skin care regimen initiated : 09/15/2020 Notes: Electronic Signature(s) Signed: 09/30/2020 5:46:47 PM By: Gretta Cool, BSN, RN, CWS, Kim RN, BSN Entered By: Gretta Cool, BSN, RN, CWS, Kim on 09/29/2020 08:44:46 Henry Hill ([id number]) -------------------------------------------------------------------------------- Pain Assessment Details Patient Name: Henry Hill, Henry Hill. Date of Service: 09/29/2020 8:15 AM Medical Record Number: [id number] Patient Account Number: [id number] Date of Birth/Sex: 1961/04/29 (60 y.o. M) Treating RN: Primary Care Camile Esters: Vernie Murders Other Clinician: Referring Rekha Hobbins: Vernie Murders Treating Shriyans Kuenzi/Extender: Tito Dine in Treatment: 2 Active Problems Location of Pain Severity and Description of Pain Patient Has Paino No Site Locations Rate the pain. Current Pain  Level: 0 Pain Management and Medication Current Pain Management: Electronic Signature(s) Signed: 09/30/2020 5:46:47 PM By: Gretta Cool, BSN, RN, CWS, Kim RN, BSN Entered By: Gretta Cool, BSN, RN, CWS, Kim on 09/29/2020 08:44:37 Henry Hill ([id number]) -------------------------------------------------------------------------------- Patient/Caregiver Education Details Patient Name: Henry Hill Date of Service: 09/29/2020 8:15 AM Medical Record Number: [id number] Patient Account Number: [id number] Date of Birth/Gender: 1960/05/19 (60 y.o. M) Treating RN: Cornell Barman Primary Care Physician: Vernie Murders Other Clinician: Referring Physician: Vernie Murders Treating Physician/Extender: Tito Dine in Treatment: 2 Education Assessment Education Provided To: Patient Education Topics Provided Wound/Skin Impairment: Handouts: Caring for Your Ulcer, Other: Continue wound care as prescribed Methods: Demonstration, Explain/Verbal Responses: State content correctly Electronic Signature(s) Signed: 09/30/2020 5:46:47 PM By: Gretta Cool, BSN, RN, CWS, Kim RN, BSN Entered By: Gretta Cool, BSN, RN, CWS, Kim on 09/29/2020 08:46:19 Henry Hill ([id number]) -------------------------------------------------------------------------------- Wound Assessment Details Patient Name: Henry Hill, Henry Hill. Date of Service: 09/29/2020 8:15 AM  Medical Record Number: [id number] Patient Account Number: [id number] Date of Birth/Sex: [birth year] (60 y.o. M) Treating RN: Primary Care Jasminemarie Sherrard: Vernie Murders Other Clinician: Referring Savva Beamer: Vernie Murders Treating Taleigh Gero/Extender: Tito Dine in Treatment: 2 Wound Status Wound Number: 1 Primary Etiology: Venous Leg Ulcer Wound Location: Left, Medial Lower Leg Wound Status: Open Wounding Event: Gradually Appeared Comorbid History: Arrhythmia, Hypertension Date Acquired: 08/09/2019 Weeks Of Treatment: 2 Clustered Wound: No Photos Wound  Measurements Length: (cm) 1.5 Width: (cm) 0.7 Depth: (cm) 0.1 Area: (cm) 0.825 Volume: (cm) 0.082 % Reduction in Area: 73.1% % Reduction in Volume: 73.2% Epithelialization: Small (1-33%) Tunneling: No Undermining: No Wound Description Classification: Full Thickness Without Exposed Support Structu Exudate Amount: Small Exudate Type: Serous Exudate Color: amber res Foul Odor After Cleansing: No Slough/Fibrino Yes Wound Bed Granulation Amount: Small (1-33%) Exposed Structure Granulation Quality: Pink Fascia Exposed: No Necrotic Amount: Medium (34-66%) Fat Layer (Subcutaneous Tissue) Exposed: Yes Necrotic Quality: Adherent Slough Tendon Exposed: No Muscle Exposed: No Joint Exposed: No Bone Exposed: No Treatment Notes Wound #1 (Lower Leg) Wound Laterality: Left, Medial Cleanser Soap and Water Discharge Instruction: Gently cleanse wound with antibacterial soap, rinse and pat dry prior to dressing wounds Peri-Wound Care Henry Hill, Henry Hill ([id number]) Topical Primary Dressing Prisma 4.34 (in) Discharge Instruction: Moisten w/normal saline or sterile water; Cover wound as directed. Do not remove from wound bed. Secondary Dressing Mepilex Border Flex, 4x4 (in/in) Discharge Instruction: Apply to wound as directed. Do not cut. Secured With Compression Wrap Compression Stockings Add-Ons Electronic Signature(s) Signed: 10/02/2020 4:37:07 PM By: Jeanine Luz Entered By: Jeanine Luz on 09/29/2020 08:23:37 Henry Hill ([id number]) -------------------------------------------------------------------------------- Vitals Details Patient Name: Henry Hill. Date of Service: 09/29/2020 8:15 AM Medical Record Number: [id number] Patient Account Number: [id number] Date of Birth/Sex: [birth year] (60 y.o. M) Treating RN: Primary Care Sequoya Hogsett: Vernie Murders Other Clinician: Referring Cadarius Nevares: Vernie Murders Treating Jasai Sorg/Extender: Tito Dine in Treatment:  2 Vital Signs Time Taken: 08:16 Temperature (F): 98.0 Height (in): 72 Pulse (bpm): 73 Weight (lbs): 170 Respiratory Rate (breaths/min): 18 Body Mass Index (BMI): 23.1 Blood Pressure (mmHg): 157/102 Reference Range: 80 - 120 mg / dl Notes Recheck of BP 156/96; Patient states he just took bp meds. Electronic Signature(s) Signed: 09/30/2020 5:46:47 PM By: Gretta Cool, BSN, RN, CWS, Kim RN, BSN Entered By: Gretta Cool, BSN, RN, CWS, Kim on 09/29/2020 08:44:21

## 2020-10-02 NOTE — Progress Notes (Signed)
Short Term Disability form and records faxed to [facility], [phone]

## 2020-10-07 ENCOUNTER — Other Ambulatory Visit: Payer: Self-pay

## 2020-10-07 ENCOUNTER — Encounter: Payer: 59 | Admitting: Physician Assistant

## 2020-10-07 ENCOUNTER — Telehealth: Payer: Self-pay | Admitting: Cardiovascular Disease

## 2020-10-07 DIAGNOSIS — L97828 Non-pressure chronic ulcer of other part of left lower leg with other specified severity: Secondary | ICD-10-CM | POA: Diagnosis not present

## 2020-10-07 NOTE — Progress Notes (Addendum)
TAGGART, PRASAD ([id number]) Visit Report for 10/07/2020 Chief Complaint Document Details Patient Name: Henry Hill, Henry Hill. Date of Service: 10/07/2020 10:30 AM Medical Record Number: [id number] Patient Account Number: [id number] Date of Birth/Sex: [birth year] (60 y.o. M) Treating RN: Dolan Amen Primary Care Provider: Vernie Murders Other Clinician: Referring Provider: Vernie Murders Treating Provider/Extender: Skipper Cliche in Treatment: 3 Information Obtained from: Patient Chief Complaint Left LE Ulcer Electronic Signature(s) Signed: 10/07/2020 10:47:58 AM By: Worthy Keeler PA-C Entered By: Worthy Keeler on 10/07/2020 10:47:58 Henry Hill ([id number]) -------------------------------------------------------------------------------- Debridement Details Patient Name: Henry Hill Date of Service: 10/07/2020 10:30 AM Medical Record Number: [id number] Patient Account Number: [id number] Date of Birth/Sex: 08/05/1960 (60 y.o. M) Treating RN: Dolan Amen Primary Care Provider: Vernie Murders Other Clinician: Referring Provider: Vernie Murders Treating Provider/Extender: Skipper Cliche in Treatment: 3 Debridement Performed for Wound #1 Left,Medial Lower Leg Assessment: Performed By: Physician Tommie Sams., PA-C Debridement Type: Debridement Severity of Tissue Pre Debridement: Limited to breakdown of skin Level of Consciousness (Pre- Awake and Alert procedure): Pre-procedure Verification/Time Out Yes - 11:41 Taken: Start Time: 11:41 Total Area Debrided (L x W): 1 (cm) x 0.6 (cm) = 0.6 (cm) Tissue and other material Non-Viable, Fibrin/Exudate debrided: Level: Non-Viable Tissue Debridement Description: Selective/Open Wound Instrument: Curette Bleeding: None Response to Treatment: Procedure was tolerated well Level of Consciousness (Post- Awake and Alert procedure): Post Debridement Measurements of Total Wound Length: (cm) 0.3 Width: (cm) 0.4 Depth:  (cm) 0.1 Volume: (cm) 0.009 Character of Wound/Ulcer Post Debridement: Stable Severity of Tissue Post Debridement: Fat layer exposed Post Procedure Diagnosis Same as Pre-procedure Electronic Signature(s) Signed: 10/07/2020 4:43:03 PM By: Charlett Nose RN Signed: 10/07/2020 5:49:10 PM By: Worthy Keeler PA-C Entered By: Georges Mouse, Minus Breeding on 10/07/2020 11:45:57 Henry Hill ([id number]) -------------------------------------------------------------------------------- HPI Details Patient Name: Henry Hill. Date of Service: 10/07/2020 10:30 AM Medical Record Number: [id number] Patient Account Number: [id number] Date of Birth/Sex: 05-25-1960 (60 y.o. M) Treating RN: Dolan Amen Primary Care Provider: Vernie Murders Other Clinician: Referring Provider: Vernie Murders Treating Provider/Extender: Skipper Cliche in Treatment: 3 History of Present Illness HPI Description: 09/15/2020 upon evaluation today patient appears to be doing somewhat poorly in regard to the wound that he tells me has been dealing with for about a year. Has been working with his primary care provider to try to help get this wound healed. He comes in today as he states that they had "failed" and getting this to completely close. Fortunately there does not appear to be any signs of active infection he did have a lot of eschar and dried drainage on the surface of the wound area. With that being said we were able to carefully remove this today I did not even have to perform debridement. This appears to be doing quite well once removed. He does have a history of chronic venous insufficiency, peripheral vascular disease, atrial fibrillation, long-term use of anticoagulant therapy which has recently been started with Eliquis due to the fact that he was diagnosed with A. fib when he was in the hospital due to a bowel obstruction. He also has hypertension. 09/22/2020 upon evaluation today patient appears to be  doing well with regard to his wound. He has been tolerating the dressing changes without complication. Fortunately there is no signs of active infection at this time. No fevers, chills, nausea, vomiting, or diarrhea. 5/23; patient with a wound on the left medial lower extremity. History of significant chronic venous insufficiency apparently  has had procedures done by Dr. Lucky Cowboy he also had an ABI in this clinic of 0.59 he has limiting claudication although I could not quantify this. He is going for noninvasive arterial studies but this is being delayed as he apparently is having a cardiac stress test related to recent atrial fibrillation. He is using collagen 10/07/2020 upon evaluation today patient appears to be doing well in regard to his ankle ulcer. In fact this appears to be doing significantly better and is much smaller. He is going require some sharp debridement but again this is really not significantly problematic as I believe he is for the most part completely healed underneath the majority the area that I want to remove some of the necrotic debris. This seems to be more drainage and dry skin than anything. Electronic Signature(s) Signed: 10/07/2020 5:39:27 PM By: Worthy Keeler PA-C Entered By: Worthy Keeler on 10/07/2020 17:39:27 Henry Hill ([id number]) -------------------------------------------------------------------------------- Physical Exam Details Patient Name: Henry Hill, Henry Hill. Date of Service: 10/07/2020 10:30 AM Medical Record Number: [id number] Patient Account Number: [id number] Date of Birth/Sex: 1960/06/09 (60 y.o. M) Treating RN: Dolan Amen Primary Care Provider: Vernie Murders Other Clinician: Referring Provider: Vernie Murders Treating Provider/Extender: Skipper Cliche in Treatment: 3 Constitutional Well-nourished and well-hydrated in no acute distress. Respiratory normal breathing without difficulty. Psychiatric this patient is able to make decisions  and demonstrates good insight into disease process. Alert and Oriented x 3. pleasant and cooperative. Notes Upon inspection patient's wound bed showed signs of good granulation epithelization he did have a little bit of hypergranulation which is of concern. I think we want to switch over to [facility] dressing. Other than that I feel like in general he is doing quite well and I am extremely pleased with what I am seeing today. Electronic Signature(s) Signed: 10/07/2020 5:39:51 PM By: Worthy Keeler PA-C Entered By: Worthy Keeler on 10/07/2020 17:39:51 Henry Hill ([id number]) -------------------------------------------------------------------------------- Physician Orders Details Patient Name: Henry Hill, Henry Hill. Date of Service: 10/07/2020 10:30 AM Medical Record Number: [id number] Patient Account Number: [id number] Date of Birth/Sex: 1960/06/05 (60 y.o. M) Treating RN: Dolan Amen Primary Care Provider: Vernie Murders Other Clinician: Referring Provider: Vernie Murders Treating Provider/Extender: Skipper Cliche in Treatment: 3 Verbal / Phone Orders: No Diagnosis Coding ICD-10 Coding Code Description I87.2 Venous insufficiency (chronic) (peripheral) L97.828 Non-pressure chronic ulcer of other part of left lower leg with other specified severity I48.0 Paroxysmal atrial fibrillation Z79.01 Long term (current) use of anticoagulants I10 Essential (primary) hypertension Follow-up Appointments o Return Appointment in 2 weeks. Bathing/ Shower/ Hygiene o May shower; gently cleanse wound with antibacterial soap, rinse and pat dry prior to dressing wounds Wound Treatment Wound #1 - Lower Leg Wound Laterality: Left, Medial Cleanser: Soap and Water 3 x Per Week/30 Days Discharge Instructions: Gently cleanse wound with antibacterial soap, rinse and pat dry prior to dressing wounds Primary Dressing: Hydrofera Blue Ready Transfer Foam, 2.5x2.5 (in/in) 3 x Per Week/30  Days Discharge Instructions: Apply Hydrofera Blue Ready to wound bed as directed Secondary Dressing: [place] Dressing, 4x4 (in/in) 3 x Per Week/30 Days Discharge Instructions: Apply over dressing to secure in place. Electronic Signature(s) Signed: 10/07/2020 4:43:03 PM By: Georges Mouse, Minus Breeding RN Signed: 10/07/2020 5:49:10 PM By: Worthy Keeler PA-C Entered By: Georges Mouse, Minus Breeding on 10/07/2020 11:47:36 Henry Hill ([id number]) -------------------------------------------------------------------------------- Problem List Details Patient Name: Henry Hill, Henry Hill. Date of Service: 10/07/2020 10:30 AM Medical Record Number: [id number] Patient Account Number: [id number] Date  of Birth/Sex: 17-Oct-1960 (60 y.o. M) Treating RN: Dolan Amen Primary Care Provider: Vernie Murders Other Clinician: Referring Provider: Vernie Murders Treating Provider/Extender: Skipper Cliche in Treatment: 3 Active Problems ICD-10 Encounter Code Description Active Date MDM Diagnosis I87.2 Venous insufficiency (chronic) (peripheral) 09/15/2020 No Yes L97.828 Non-pressure chronic ulcer of other part of left lower leg with other 09/15/2020 No Yes specified severity I48.0 Paroxysmal atrial fibrillation 09/15/2020 No Yes Z79.01 Long term (current) use of anticoagulants 09/15/2020 No Yes I10 Essential (primary) hypertension 09/15/2020 No Yes Inactive Problems Resolved Problems Electronic Signature(s) Signed: 10/07/2020 10:47:52 AM By: Worthy Keeler PA-C Entered By: Worthy Keeler on 10/07/2020 10:47:52 Henry Hill, Henry Hill ([id number]) -------------------------------------------------------------------------------- Progress Note Details Patient Name: Henry Hill. Date of Service: 10/07/2020 10:30 AM Medical Record Number: [id number] Patient Account Number: [id number] Date of Birth/Sex: [birth year] (60 y.o. M) Treating RN: Dolan Amen Primary Care Provider: Vernie Murders Other  Clinician: Referring Provider: Vernie Murders Treating Provider/Extender: Skipper Cliche in Treatment: 3 Subjective Chief Complaint Information obtained from Patient Left LE Ulcer History of Present Illness (HPI) 09/15/2020 upon evaluation today patient appears to be doing somewhat poorly in regard to the wound that he tells me has been dealing with for about a year. Has been working with his primary care provider to try to help get this wound healed. He comes in today as he states that they had "failed" and getting this to completely close. Fortunately there does not appear to be any signs of active infection he did have a lot of eschar and dried drainage on the surface of the wound area. With that being said we were able to carefully remove this today I did not even have to perform debridement. This appears to be doing quite well once removed. He does have a history of chronic venous insufficiency, peripheral vascular disease, atrial fibrillation, long-term use of anticoagulant therapy which has recently been started with Eliquis due to the fact that he was diagnosed with A. fib when he was in the hospital due to a bowel obstruction. He also has hypertension. 09/22/2020 upon evaluation today patient appears to be doing well with regard to his wound. He has been tolerating the dressing changes without complication. Fortunately there is no signs of active infection at this time. No fevers, chills, nausea, vomiting, or diarrhea. 5/23; patient with a wound on the left medial lower extremity. History of significant chronic venous insufficiency apparently has had procedures done by Dr. Lucky Cowboy he also had an ABI in this clinic of 0.59 he has limiting claudication although I could not quantify this. He is going for noninvasive arterial studies but this is being delayed as he apparently is having a cardiac stress test related to recent atrial fibrillation. He is using collagen 10/07/2020 upon evaluation  today patient appears to be doing well in regard to his ankle ulcer. In fact this appears to be doing significantly better and is much smaller. He is going require some sharp debridement but again this is really not significantly problematic as I believe he is for the most part completely healed underneath the majority the area that I want to remove some of the necrotic debris. This seems to be more drainage and dry skin than anything. Objective Constitutional Well-nourished and well-hydrated in no acute distress. Vitals Time Taken: 10:55 AM, Height: 72 in, Weight: 170 lbs, BMI: 23.1, Temperature: 98.5 F, Pulse: 69 bpm, Respiratory Rate: 18 breaths/Henry Hill, Blood Pressure: 170/97 mmHg. Respiratory normal breathing without difficulty. Psychiatric this patient is  able to make decisions and demonstrates good insight into disease process. Alert and Oriented x 3. pleasant and cooperative. General Notes: Upon inspection patient's wound bed showed signs of good granulation epithelization he did have a little bit of hypergranulation which is of concern. I think we want to switch over to [facility] dressing. Other than that I feel like in general he is doing quite well and I am extremely pleased with what I am seeing today. Integumentary (Hair, Skin) Wound #1 status is Open. Original cause of wound was Gradually Appeared. The date acquired was: 08/09/2019. The wound has been in treatment 3 weeks. The wound is located on the Left,Medial Lower Leg. The wound measures 1.5cm length x 0.6cm width x 0.2cm depth; 0.707cm^2 area and 0.141cm^3 volume. There is Fat Layer (Subcutaneous Tissue) exposed. There is no tunneling or undermining noted. There is a small amount of serous drainage noted. There is small (1-33%) pink granulation within the wound bed. There is a medium (34-66%) amount of necrotic tissue within the wound bed including Adherent Slough. Henry Hill, Henry Hill ([id number]) Assessment Active  Problems ICD-10 Venous insufficiency (chronic) (peripheral) Non-pressure chronic ulcer of other part of left lower leg with other specified severity Paroxysmal atrial fibrillation Long term (current) use of anticoagulants Essential (primary) hypertension Procedures Wound #1 Pre-procedure diagnosis of Wound #1 is a Venous Leg Ulcer located on the Left,Medial Lower Leg .Severity of Tissue Pre Debridement is: Limited to breakdown of skin. There was a Selective/Open Wound Non-Viable Tissue Debridement with a total area of 0.6 sq cm performed by Tommie Sams., PA-C. With the following instrument(s): Curette to remove Non-Viable tissue/material. Material removed includes Fibrin/Exudate. A time out was conducted at 11:41, prior to the start of the procedure. There was no bleeding. The procedure was tolerated well. Post Debridement Measurements: 0.3cm length x 0.4cm width x 0.1cm depth; 0.009cm^3 volume. Character of Wound/Ulcer Post Debridement is stable. Severity of Tissue Post Debridement is: Fat layer exposed. Post procedure Diagnosis Wound #1: Same as Pre-Procedure Plan Follow-up Appointments: Return Appointment in 2 weeks. Bathing/ Shower/ Hygiene: May shower; gently cleanse wound with antibacterial soap, rinse and pat dry prior to dressing wounds WOUND #1: - Lower Leg Wound Laterality: Left, Medial Cleanser: Soap and Water 3 x Per Week/30 Days Discharge Instructions: Gently cleanse wound with antibacterial soap, rinse and pat dry prior to dressing wounds Primary Dressing: Hydrofera Blue Ready Transfer Foam, 2.5x2.5 (in/in) 3 x Per Week/30 Days Discharge Instructions: Apply Hydrofera Blue Ready to wound bed as directed Secondary Dressing: [place] Dressing, 4x4 (in/in) 3 x Per Week/30 Days Discharge Instructions: Apply over dressing to secure in place. 1. Would recommend at this point that we have the patient going to continue with the wound care measures as before with  regard to the large Band-Aid to cover. Although again I recommend Hydrofera Blue as opposed to collagen for now. 2. I am also can recommend that we have the patient go ahead and monitor for any signs of worsening or infection. Obviously if anything changes he should let me know soon as possible. We will see patient back for reevaluation in 1 week here in the clinic. If anything worsens or changes patient will contact our office for additional recommendations. Electronic Signature(s) Signed: 10/07/2020 5:40:12 PM By: Worthy Keeler PA-C Entered By: Worthy Keeler on 10/07/2020 17:40:11 Henry Hill ([id number]) -------------------------------------------------------------------------------- SuperBill Details Patient Name: Henry Hill Date of Service: 10/07/2020 Medical Record Number: [id number] Patient Account Number: [id number] Date  of Birth/Sex: Jun 20, 1960 (60 y.o. M) Treating RN: Dolan Amen Primary Care Provider: Vernie Murders Other Clinician: Referring Provider: Vernie Murders Treating Provider/Extender: Skipper Cliche in Treatment: 3 Diagnosis Coding ICD-10 Codes Code Description I87.2 Venous insufficiency (chronic) (peripheral) L97.828 Non-pressure chronic ulcer of other part of left lower leg with other specified severity I48.0 Paroxysmal atrial fibrillation Z79.01 Long term (current) use of anticoagulants I10 Essential (primary) hypertension Facility Procedures CPT4 Code: [id number] Description: [phone] - DEBRIDE WOUND 1ST 20 SQ CM OR < Modifier: Quantity: 1 CPT4 Code: Description: ICD-10 Diagnosis Description L97.828 Non-pressure chronic ulcer of other part of left lower leg with other speci Modifier: fied severity Quantity: Physician Procedures CPT4 Code: [id number] Description: 47185 - WC PHYS DEBR WO ANESTH 20 SQ CM Modifier: Quantity: 1 CPT4 Code: Description: ICD-10 Diagnosis Description L97.828 Non-pressure chronic ulcer of other part of left lower  leg with other speci Modifier: fied severity Quantity: Electronic Signature(s) Signed: 10/07/2020 5:40:24 PM By: Worthy Keeler PA-C Previous Signature: 10/07/2020 4:43:03 PM Version By: Georges Mouse, Minus Breeding RN Entered By: Worthy Keeler on 10/07/2020 17:40:24

## 2020-10-07 NOTE — Telephone Encounter (Signed)
Patient calling  States Eliquis refill price is ~$500 due to insurance not covering  Would like to know other options  Please call to discuss

## 2020-10-07 NOTE — Progress Notes (Addendum)
Henry Hill, Henry Hill ([id number]) Visit Report for 10/07/2020 Arrival Information Details Patient Name: Henry Hill. Date of Service: 10/07/2020 10:30 AM Medical Record Number: [id number] Patient Account Number: [id number] Date of Birth/Sex: [birth year] (60 y.o. M) Treating RN: Dolan Amen Primary Care Branston Halsted: Vernie Murders Other Clinician: Referring Jahsir Rama: Vernie Murders Treating Jacee Enerson/Extender: Skipper Cliche in Treatment: 3 Visit Information History Since Last Visit Pain Present Now: No Patient Arrived: Ambulatory Arrival Time: 10:54 Accompanied By: self Transfer Assistance: None Patient Identification Verified: Yes Secondary Verification Process Completed: Yes Patient Requires Transmission-Based No Precautions: Patient Has Alerts: Yes Patient Alerts: Patient on Blood Thinner ELIQUIS NOT A DIABETIC Electronic Signature(s) Signed: 10/07/2020 4:43:03 PM By: Georges Mouse, Minus Breeding RN Entered By: Georges Mouse, Minus Breeding on 10/07/2020 10:54:58 Henry Hill ([id number]) -------------------------------------------------------------------------------- Clinic Level of Care Assessment Details Patient Name: Henry Hill. Date of Service: 10/07/2020 10:30 AM Medical Record Number: [id number] Patient Account Number: [id number] Date of Birth/Sex: 1961-01-11 (60 y.o. M) Treating RN: Dolan Amen Primary Care Nizar Cutler: Vernie Murders Other Clinician: Referring Demya Scruggs: Vernie Murders Treating Eilis Chestnutt/Extender: Skipper Cliche in Treatment: 3 Clinic Level of Care Assessment Items TOOL 1 Quantity Score '[]'  - Use when EandM and Procedure is performed on INITIAL visit 0 ASSESSMENTS - Nursing Assessment / Reassessment '[]'  - General Physical Exam (combine w/ comprehensive assessment (listed just below) when performed on new 0 pt. evals) '[]'  - 0 Comprehensive Assessment (HX, ROS, Risk Assessments, Wounds Hx, etc.) ASSESSMENTS - Wound and Skin Assessment /  Reassessment '[]'  - Dermatologic / Skin Assessment (not related to wound area) 0 ASSESSMENTS - Ostomy and/or Continence Assessment and Care '[]'  - Incontinence Assessment and Management 0 '[]'  - 0 Ostomy Care Assessment and Management (repouching, etc.) PROCESS - Coordination of Care '[]'  - Simple Patient / Family Education for ongoing care 0 '[]'  - 0 Complex (extensive) Patient / Family Education for ongoing care '[]'  - 0 Staff obtains Programmer, systems, Records, Test Results / Process Orders '[]'  - 0 Staff telephones HHA, Nursing Homes / Clarify orders / etc '[]'  - 0 Routine Transfer to another Facility (non-emergent condition) '[]'  - 0 Routine Hospital Admission (non-emergent condition) '[]'  - 0 New Admissions / Biomedical engineer / Ordering NPWT, Apligraf, etc. '[]'  - 0 Emergency Hospital Admission (emergent condition) PROCESS - Special Needs '[]'  - Pediatric / Minor Patient Management 0 '[]'  - 0 Isolation Patient Management '[]'  - 0 Hearing / Language / Visual special needs '[]'  - 0 Assessment of Community assistance (transportation, D/C planning, etc.) '[]'  - 0 Additional assistance / Altered mentation '[]'  - 0 Support Surface(s) Assessment (bed, cushion, seat, etc.) INTERVENTIONS - Miscellaneous '[]'  - External ear exam 0 '[]'  - 0 Patient Transfer (multiple staff / Civil Service fast streamer / Similar devices) '[]'  - 0 Simple Staple / Suture removal (25 or less) '[]'  - 0 Complex Staple / Suture removal (26 or more) '[]'  - 0 Hypo/Hyperglycemic Management (do not check if billed separately) '[]'  - 0 Ankle / Brachial Index (ABI) - do not check if billed separately Has the patient been seen at the hospital within the last three years: Yes Total Score: 0 Level Of Care: ____ Henry Hill ([id number]) Electronic Signature(s) Signed: 10/07/2020 4:43:03 PM By: Georges Mouse, Minus Breeding RN Entered By: Georges Mouse, Minus Breeding on 10/07/2020 11:46:58 Henry Hill  ([id number]) -------------------------------------------------------------------------------- Lower Extremity Assessment Details Patient Name: [name], [name] Date of Service: 10/07/2020 10:30 AM Medical Record Number: [id number] Patient Account Number: [id number] Date of Birth/Sex: April 19, 1961 (60 y.o. M) Treating RN: Dolan Amen Primary Care  Abrham Maslowski: Vernie Murders Other Clinician: Referring Iridian Reader: Vernie Murders Treating Enora Trillo/Extender: Skipper Cliche in Treatment: 3 Edema Assessment Assessed: [Left: Yes] [Right: No] Edema: [Left: N] [Right: o] Calf Left: Right: Point of Measurement: 31 cm From Medial Instep 34 cm Ankle Left: Right: Point of Measurement: 12 cm From Medial Instep 24 cm Vascular Assessment Pulses: Dorsalis Pedis Palpable: [Left:Yes] Electronic Signature(s) Signed: 10/07/2020 4:43:03 PM By: Georges Mouse, Minus Breeding RN Entered By: Georges Mouse, Minus Breeding on 10/07/2020 11:06:39 Henry Hill ([id number]) -------------------------------------------------------------------------------- Multi Wound Chart Details Patient Name: Henry Hill. Date of Service: 10/07/2020 10:30 AM Medical Record Number: [id number] Patient Account Number: [id number] Date of Birth/Sex: 09-14-1960 (60 y.o. M) Treating RN: Dolan Amen Primary Care Ayeden Gladman: Vernie Murders Other Clinician: Referring Jakeel Starliper: Vernie Murders Treating Niamh Rada/Extender: Skipper Cliche in Treatment: 3 Vital Signs Height(in): 72 Pulse(bpm): 69 Weight(lbs): 170 Blood Pressure(mmHg): 170/97 Body Mass Index(BMI): 23 Temperature(F): 98.5 Respiratory Rate(breaths/min): 18 Photos: [N/A:N/A] Wound Location: Left, Medial Lower Leg N/A N/A Wounding Event: Gradually Appeared N/A N/A Primary Etiology: Venous Leg Ulcer N/A N/A Comorbid History: Arrhythmia, Hypertension N/A N/A Date Acquired: 08/09/2019 N/A N/A Weeks of Treatment: 3 N/A N/A Wound Status: Open N/A N/A Measurements L x W x D  (cm) 1.5x0.6x0.2 N/A N/A Area (cm) : 0.707 N/A N/A Volume (cm) : 0.141 N/A N/A % Reduction in Area: 76.90% N/A N/A % Reduction in Volume: 53.90% N/A N/A Classification: Full Thickness Without Exposed N/A N/A Support Structures Exudate Amount: Small N/A N/A Exudate Type: Serous N/A N/A Exudate Color: amber N/A N/A Granulation Amount: Small (1-33%) N/A N/A Granulation Quality: Pink N/A N/A Necrotic Amount: Medium (34-66%) N/A N/A Exposed Structures: Fat Layer (Subcutaneous Tissue): N/A N/A Yes Fascia: No Tendon: No Muscle: No Joint: No Bone: No Epithelialization: Small (1-33%) N/A N/A Treatment Notes Electronic Signature(s) Signed: 10/07/2020 4:43:03 PM By: Georges Mouse, Minus Breeding RN Entered By: Georges Mouse, Minus Breeding on 10/07/2020 11:41:21 Henry Hill ([id number]) -------------------------------------------------------------------------------- [place] Details Patient Name: GARISON, GENOVA. Date of Service: 10/07/2020 10:30 AM Medical Record Number: [id number] Patient Account Number: [id number] Date of Birth/Sex: 31-Jul-1960 (60 y.o. M) Treating RN: Dolan Amen Primary Care Sekou Zuckerman: Vernie Murders Other Clinician: Referring Corazon Nickolas: Vernie Murders Treating Jaevion Goto/Extender: Skipper Cliche in Treatment: 3 Active Inactive Wound/Skin Impairment Nursing Diagnoses: Impaired tissue integrity Knowledge deficit related to ulceration/compromised skin integrity Goals: Patient/caregiver will verbalize understanding of skin care regimen Date Initiated: 09/15/2020 Date Inactivated: 09/22/2020 Target Resolution Date: 10/17/2020 Goal Status: Met Ulcer/skin breakdown will have a volume reduction of 30% by week 4 Date Initiated: 09/15/2020 Target Resolution Date: 10/13/2020 Goal Status: Active Ulcer/skin breakdown will have a volume reduction of 50% by week 8 Date Initiated: 09/15/2020 Target Resolution Date: 11/10/2020 Goal Status:  Active Interventions: Assess patient/caregiver ability to obtain necessary supplies Assess patient/caregiver ability to perform ulcer/skin care regimen upon admission and as needed Assess ulceration(s) every visit Treatment Activities: Skin care regimen initiated : 09/15/2020 Notes: Electronic Signature(s) Signed: 10/07/2020 4:43:03 PM By: Georges Mouse, Minus Breeding RN Entered By: Georges Mouse, Minus Breeding on 10/07/2020 11:40:35 Henry Hill ([id number]) -------------------------------------------------------------------------------- Pain Assessment Details Patient Name: Henry Hill. Date of Service: 10/07/2020 10:30 AM Medical Record Number: [id number] Patient Account Number: [id number] Date of Birth/Sex: 1960-06-24 (60 y.o. M) Treating RN: Dolan Amen Primary Care Columbia Pandey: Vernie Murders Other Clinician: Referring Mistie Adney: Vernie Murders Treating Allanna Bresee/Extender: Skipper Cliche in Treatment: 3 Active Problems Location of Pain Severity and Description of Pain Patient Has Paino No Site Locations Rate the pain. Current Pain Level:  0 Pain Management and Medication Current Pain Management: Electronic Signature(s) Signed: 10/07/2020 4:43:03 PM By: Georges Mouse, Minus Breeding RN Entered By: Georges Mouse, Minus Breeding on 10/07/2020 10:56:12 Henry Hill ([id number]) -------------------------------------------------------------------------------- Patient/Caregiver Education Details Patient Name: Henry Hill Date of Service: 10/07/2020 10:30 AM Medical Record Number: [id number] Patient Account Number: [id number] Date of Birth/Gender: February 25, 1961 (60 y.o. M) Treating RN: Dolan Amen Primary Care Physician: Vernie Murders Other Clinician: Referring Physician: Vernie Murders Treating Physician/Extender: Skipper Cliche in Treatment: 3 Education Assessment Education Provided To: Patient Education Topics Provided Wound/Skin Impairment: Methods: Explain/Verbal Responses:  State content correctly Electronic Signature(s) Signed: 10/07/2020 4:43:03 PM By: Georges Mouse, Minus Breeding RN Entered By: Georges Mouse, Minus Breeding on 10/07/2020 11:47:17 Henry Hill ([id number]) -------------------------------------------------------------------------------- Wound Assessment Details Patient Name: JAQUILLE, KAU. Date of Service: 10/07/2020 10:30 AM Medical Record Number: [id number] Patient Account Number: [id number] Date of Birth/Sex: 1960-09-10 (60 y.o. M) Treating RN: Dolan Amen Primary Care Laroy Mustard: Vernie Murders Other Clinician: Referring Yonna Alwin: Vernie Murders Treating Kailah Pennel/Extender: Skipper Cliche in Treatment: 3 Wound Status Wound Number: 1 Primary Etiology: Venous Leg Ulcer Wound Location: Left, Medial Lower Leg Wound Status: Open Wounding Event: Gradually Appeared Comorbid History: Arrhythmia, Hypertension Date Acquired: 08/09/2019 Weeks Of Treatment: 3 Clustered Wound: No Photos Wound Measurements Length: (cm) 1.5 Width: (cm) 0.6 Depth: (cm) 0.2 Area: (cm) 0.707 Volume: (cm) 0.141 % Reduction in Area: 76.9% % Reduction in Volume: 53.9% Epithelialization: Small (1-33%) Tunneling: No Undermining: No Wound Description Classification: Full Thickness Without Exposed Support Structu Exudate Amount: Small Exudate Type: Serous Exudate Color: amber res Foul Odor After Cleansing: No Slough/Fibrino Yes Wound Bed Granulation Amount: Small (1-33%) Exposed Structure Granulation Quality: Pink Fascia Exposed: No Necrotic Amount: Medium (34-66%) Fat Layer (Subcutaneous Tissue) Exposed: Yes Necrotic Quality: Adherent Slough Tendon Exposed: No Muscle Exposed: No Joint Exposed: No Bone Exposed: No Electronic Signature(s) Signed: 10/07/2020 4:43:03 PM By: Georges Mouse, Minus Breeding RN Entered By: Georges Mouse, Minus Breeding on 10/07/2020 11:04:22 Henry Hill  ([id number]) -------------------------------------------------------------------------------- Vitals Details Patient Name: Henry Hill. Date of Service: 10/07/2020 10:30 AM Medical Record Number: [id number] Patient Account Number: [id number] Date of Birth/Sex: 04-25-1961 (60 y.o. M) Treating RN: Dolan Amen Primary Care Payton Prinsen: Vernie Murders Other Clinician: Referring Nikalas Bramel: Vernie Murders Treating Lizzie Cokley/Extender: Skipper Cliche in Treatment: 3 Vital Signs Time Taken: 10:55 Temperature (F): 98.5 Height (in): 72 Pulse (bpm): 69 Weight (lbs): 170 Respiratory Rate (breaths/min): 18 Body Mass Index (BMI): 23.1 Blood Pressure (mmHg): 170/97 Reference Range: 80 - 120 mg / dl Electronic Signature(s) Signed: 10/07/2020 4:43:03 PM By: Georges Mouse, Minus Breeding RN Entered By: Georges Mouse, Minus Breeding on 10/07/2020 10:55:57

## 2020-10-07 NOTE — Telephone Encounter (Signed)
Per fax received from [facility] pharmacy, Eliquis is not preferred on patient's drug plan. Insurance will cover Xarelto. Please advise.

## 2020-10-08 ENCOUNTER — Ambulatory Visit (INDEPENDENT_AMBULATORY_CARE_PROVIDER_SITE_OTHER): Payer: 59 | Admitting: Surgery

## 2020-10-08 ENCOUNTER — Encounter: Payer: Self-pay | Admitting: Surgery

## 2020-10-08 VITALS — BP 177/91 | HR 75 | Temp 98.1°F | Ht 72.0 in | Wt 170.0 lb

## 2020-10-08 DIAGNOSIS — Z9049 Acquired absence of other specified parts of digestive tract: Secondary | ICD-10-CM

## 2020-10-08 DIAGNOSIS — Z09 Encounter for follow-up examination after completed treatment for conditions other than malignant neoplasm: Secondary | ICD-10-CM

## 2020-10-08 MED ORDER — RIVAROXABAN 20 MG PO TABS: 20.0000 mg | ORAL_TABLET | Freq: Every day | ORAL | 11 refills | Status: DC

## 2020-10-08 NOTE — Telephone Encounter (Signed)
Attempted to return pt's call, LDM on VM (DPR approved) advised Dr. Rockey Situ approved okay "Eliquis can be changed to Xarelto 20 daily"  Medication for Xaretlo 20 mg daily was sent in to pharmacy at [facility]. Eliquis was removed from pt's list.   Advised pt to call back with any further concerns or questions.

## 2020-10-08 NOTE — Patient Instructions (Addendum)
You may try using Benefiber or Metamucil once a day to help absorb some liquid from your stool. If you still are having loose stools you may also try using Imodium 1 tablet a day and may increase if needed. Be sure to drink plenty of fluids.  You may want to get an abdominal binder to help with the discomfort.    Continue to try and quit smoking.  Follow up here as needed.  We will get the results from Dr Marius Ditch.  We will extend your leave for an additional 2 weeks with your return to work being on 11/03/20.

## 2020-10-08 NOTE — Progress Notes (Signed)
10/08/2020  HPI: Henry Hill is a 60 y.o. 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  Patient presents for follow up.  He was last seen three weeks ago, at which time his drain was removed.  Today, patient reports that he still feels discomfort in his right side and midline incision, particularly after driving/riding in a car for longer times.  Denies any nausea, vomiting.  Reports that his stools are loose still.  He has not tried any medications for his stools yet.  Vital signs: BP (!) 177/91   Pulse 75   Temp 98.1 F (36.7 C)   Ht 6' (1.829 m)   Wt 170 lb (77.1 kg)   SpO2 97%   BMI 23.06 kg/m    Physical Exam: Constitutional:  No acute distress Abdomen:  Soft, non-distended, still with discomfort to palpation in the midline incision and right lower quadrant.  Incision is healed well, without any hernia notable.  Assessment/Plan: This is a 60 y.o. male s/p exlap and right colectomy  --Discussed with him that he can start taking Benefiber or Metamucil once daily to help with his loose stools.  He may add Imodium as well if fiber alone is not helping. --I think the discomfort that he's having is still likely related to post-surgical changes and healing process.  He does smoke still so that may also be slowing down the healing process.  I think this will continue to improve.  Recommended that he use an abdominal binder to help with some of the symptoms. --Discussed when it would be ok to go back to work.  I think it's reasonable given his current symptoms, to wait until 6/27 to go back to work.  Will update paperwork as needed. --Also discussed with him that he still needs to get a colonoscopy in order to look at the anastomosis given that the resection margin was adjacent to another polyp.  He has an appointment with Dr. Marius Ditch later this month. --Follow up as needed.   Melvyn Neth, [facility]

## 2020-10-08 NOTE — Telephone Encounter (Signed)
Eliquis can be changed to Xarelto 20 daily

## 2020-10-09 ENCOUNTER — Encounter
Admission: RE | Admit: 2020-10-09 | Discharge: 2020-10-09 | Disposition: A | Discharge status: Home / Self Care | Payer: 59 | Source: Ambulatory Visit | Attending: Nurse Practitioner | Admitting: Nurse Practitioner

## 2020-10-09 ENCOUNTER — Other Ambulatory Visit: Payer: Self-pay

## 2020-10-09 DIAGNOSIS — I480 Paroxysmal atrial fibrillation: Secondary | ICD-10-CM | POA: Insufficient documentation

## 2020-10-09 MED ORDER — REGADENOSON 0.4 MG/5ML IV SOLN
0.4000 mg | Freq: Once | INTRAVENOUS | Status: AC
  Administered 2020-10-09: 0.4 mg via INTRAVENOUS

## 2020-10-09 MED ORDER — TECHNETIUM TC 99M TETROFOSMIN IV KIT
30.0000 | PACK | Freq: Once | INTRAVENOUS | Status: AC | PRN
  Administered 2020-10-09: 32.27 via INTRAVENOUS

## 2020-10-09 MED ORDER — TECHNETIUM TC 99M TETROFOSMIN IV KIT
10.3000 | PACK | Freq: Once | INTRAVENOUS | Status: AC | PRN
  Administered 2020-10-09: 10.3 via INTRAVENOUS

## 2020-10-10 LAB — NM MYOCAR MULTI W/SPECT W/WALL MOTION / EF
LV dias vol: 142 mL (ref 62–150)
LV sys vol: 53 mL
Peak HR: 93 {beats}/min
Percent HR: 58 %
Rest HR: 62 {beats}/min
SDS: 0
SRS: 12
SSS: 9
TID: 1.13

## 2020-10-13 ENCOUNTER — Telehealth: Payer: Self-pay | Admitting: *Deleted

## 2020-10-13 NOTE — Telephone Encounter (Signed)
-----   Message from Theora Gianotti, NP sent at 10/13/2020  8:27 AM EDT ----- Low risk study w/o any significant suggestion of heart artery blockage.  It was noted that imaging was poor due to GI uptake of the tracer agent.  If he were to develop symptoms of chest pain or worsening dyspnea, we might consider an alternate imaging study, such as CT angio of the heart arteries, but in the absence of symptoms, we'll plan to continue medical therapy.

## 2020-10-13 NOTE — Telephone Encounter (Signed)
Spoke to pt. Notified of myoview results and provider's recc.  Pt confirms that does not have any chest pain or dyspnea currently. Pt voiced understanding to contact office if he develops chest pain or dyspnea.  Pt has no further questions at this time.

## 2020-10-15 ENCOUNTER — Telehealth: Payer: Self-pay | Admitting: *Deleted

## 2020-10-15 NOTE — Telephone Encounter (Signed)
Faxed FMLA to [employer] [phone]

## 2020-10-16 ENCOUNTER — Other Ambulatory Visit: Payer: Self-pay | Admitting: *Deleted

## 2020-10-16 MED ORDER — METOPROLOL TARTRATE 50 MG PO TABS: 75.0000 mg | ORAL_TABLET | Freq: Two times a day (BID) | ORAL | 0 refills | Status: DC

## 2020-10-21 ENCOUNTER — Encounter: Payer: 59 | Attending: Physician Assistant | Admitting: Physician Assistant

## 2020-10-21 ENCOUNTER — Other Ambulatory Visit: Payer: Self-pay

## 2020-10-21 DIAGNOSIS — I872 Venous insufficiency (chronic) (peripheral): Secondary | ICD-10-CM | POA: Insufficient documentation

## 2020-10-21 DIAGNOSIS — I480 Paroxysmal atrial fibrillation: Secondary | ICD-10-CM | POA: Insufficient documentation

## 2020-10-21 DIAGNOSIS — L97828 Non-pressure chronic ulcer of other part of left lower leg with other specified severity: Secondary | ICD-10-CM | POA: Insufficient documentation

## 2020-10-21 DIAGNOSIS — Z7901 Long term (current) use of anticoagulants: Secondary | ICD-10-CM | POA: Diagnosis not present

## 2020-10-21 DIAGNOSIS — I10 Essential (primary) hypertension: Secondary | ICD-10-CM | POA: Insufficient documentation

## 2020-10-21 NOTE — Progress Notes (Addendum)
Henry Hill, Henry Hill ([id number]) Visit Report for 10/21/2020 Chief Complaint Document Details Patient Name: Henry Hill, [place]. Date of Service: 10/21/2020 2:00 PM Medical Record Number: [id number] Patient Account Number: [id number] Date of Birth/Sex: 1960/12/24 (60 y.o. M) Treating RN: Dolan Amen Primary Care Provider: Vernie Murders Other Clinician: Referring Provider: Vernie Murders Treating Provider/Extender: Skipper Cliche in Treatment: 5 Information Obtained from: Patient Chief Complaint Left LE Ulcer Electronic Signature(s) Signed: 10/21/2020 2:13:33 PM By: Worthy Keeler PA-C Entered By: Worthy Keeler on 10/21/2020 14:13:33 Henry Hill ([id number]) -------------------------------------------------------------------------------- Debridement Details Patient Name: Henry Hill Date of Service: 10/21/2020 2:00 PM Medical Record Number: [id number] Patient Account Number: [id number] Date of Birth/Sex: [birth year] (60 y.o. M) Treating RN: Dolan Amen Primary Care Provider: Vernie Murders Other Clinician: Referring Provider: Vernie Murders Treating Provider/Extender: Skipper Cliche in Treatment: 5 Debridement Performed for Wound #1 Left,Medial Lower Leg Assessment: Performed By: Physician Tommie Sams., PA-C Debridement Type: Debridement Severity of Tissue Pre Debridement: Fat layer exposed Level of Consciousness (Pre- Awake and Alert procedure): Pre-procedure Verification/Time Out Yes - 14:30 Taken: Start Time: 14:30 Total Area Debrided (L x W): 0.2 (cm) x 0.2 (cm) = 0.04 (cm) Tissue and other material Non-Viable, Skin: Epidermis, Fibrin/Exudate debrided: Level: Skin/Epidermis Debridement Description: Selective/Open Wound Instrument: Curette Bleeding: Minimum Hemostasis Achieved: Pressure Response to Treatment: Procedure was tolerated well Level of Consciousness (Post- Awake and Alert procedure): Post Debridement Measurements of Total  Wound Length: (cm) 0.2 Width: (cm) 0.2 Depth: (cm) 0.2 Volume: (cm) 0.006 Character of Wound/Ulcer Post Debridement: Stable Severity of Tissue Post Debridement: Fat layer exposed Post Procedure Diagnosis Same as Pre-procedure Electronic Signature(s) Signed: 10/21/2020 4:32:42 PM By: Georges Mouse, Minus Breeding RN Signed: 10/23/2020 4:01:31 PM By: Worthy Keeler PA-C Entered By: Georges Mouse, Minus Breeding on 10/21/2020 14:33:47 Henry Hill ([id number]) -------------------------------------------------------------------------------- HPI Details Patient Name: Henry Hill. Date of Service: 10/21/2020 2:00 PM Medical Record Number: [id number] Patient Account Number: [id number] Date of Birth/Sex: 12/12/1960 (60 y.o. M) Treating RN: Dolan Amen Primary Care Provider: Vernie Murders Other Clinician: Referring Provider: Vernie Murders Treating Provider/Extender: Skipper Cliche in Treatment: 5 History of Present Illness HPI Description: 09/15/2020 upon evaluation today patient appears to be doing somewhat poorly in regard to the wound that he tells me has been dealing with for about a year. Has been working with his primary care provider to try to help get this wound healed. He comes in today as he states that they had "failed" and getting this to completely close. Fortunately there does not appear to be any signs of active infection he did have a lot of eschar and dried drainage on the surface of the wound area. With that being said we were able to carefully remove this today I did not even have to perform debridement. This appears to be doing quite well once removed. He does have a history of chronic venous insufficiency, peripheral vascular disease, atrial fibrillation, long-term use of anticoagulant therapy which has recently been started with Eliquis due to the fact that he was diagnosed with A. fib when he was in the hospital due to a bowel obstruction. He also has  hypertension. 09/22/2020 upon evaluation today patient appears to be doing well with regard to his wound. He has been tolerating the dressing changes without complication. Fortunately there is no signs of active infection at this time. No fevers, chills, nausea, vomiting, or diarrhea. 5/23; patient with a wound on the left medial lower extremity. History of significant chronic venous  insufficiency apparently has had procedures done by Dr. Lucky Cowboy he also had an ABI in this clinic of 0.59 he has limiting claudication although I could not quantify this. He is going for noninvasive arterial studies but this is being delayed as he apparently is having a cardiac stress test related to recent atrial fibrillation. He is using collagen 10/07/2020 upon evaluation today patient appears to be doing well in regard to his ankle ulcer. In fact this appears to be doing significantly better and is much smaller. He is going require some sharp debridement but again this is really not significantly problematic as I believe he is for the most part completely healed underneath the majority the area that I want to remove some of the necrotic debris. This seems to be more drainage and dry skin than anything. 10/21/2020 upon evaluation today patient appears to be doing well with regard to his wound. He has been tolerating the/40 dressing changes without complication. Fortunately there is no signs of active infection at this time. Electronic Signature(s) Signed: 10/21/2020 2:34:55 PM By: Worthy Keeler PA-C Entered By: Worthy Keeler on 10/21/2020 14:34:54 Henry Hill ([id number]) -------------------------------------------------------------------------------- Physical Exam Details Patient Name: Henry Hill, KOEPP. Date of Service: 10/21/2020 2:00 PM Medical Record Number: [id number] Patient Account Number: [id number] Date of Birth/Sex: Jul 14, 1960 (60 y.o. M) Treating RN: Dolan Amen Primary Care Provider: Vernie Murders  Other Clinician: Referring Provider: Vernie Murders Treating Provider/Extender: Skipper Cliche in Treatment: 5 Constitutional Well-nourished and well-hydrated in no acute distress. Respiratory normal breathing without difficulty. Psychiatric this patient is able to make decisions and demonstrates good insight into disease process. Alert and Oriented x 3. pleasant and cooperative. Notes Patient does have a little bit of dry skin around the edges of the wound that is covered over a portion of the wound and tracking some fluid around the perimeter as well. For that reason I did remove that today I did not perform any subcutaneous debridement this was mainly just superficial dry skin to prevent maceration and collection of drainage. Electronic Signature(s) Signed: 10/21/2020 2:35:18 PM By: Worthy Keeler PA-C Entered By: Worthy Keeler on 10/21/2020 14:35:18 Henry Hill ([id number]) -------------------------------------------------------------------------------- Physician Orders Details Patient Name: Henry Hill, CHAMPLAIN. Date of Service: 10/21/2020 2:00 PM Medical Record Number: [id number] Patient Account Number: [id number] Date of Birth/Sex: May 25, 1960 (60 y.o. M) Treating RN: Dolan Amen Primary Care Provider: Vernie Murders Other Clinician: Referring Provider: Vernie Murders Treating Provider/Extender: Skipper Cliche in Treatment: 5 Verbal / Phone Orders: No Diagnosis Coding ICD-10 Coding Code Description I87.2 Venous insufficiency (chronic) (peripheral) L97.828 Non-pressure chronic ulcer of other part of left lower leg with other specified severity I48.0 Paroxysmal atrial fibrillation Z79.01 Long term (current) use of anticoagulants I10 Essential (primary) hypertension Follow-up Appointments o Return Appointment in 2 weeks. Bathing/ Shower/ Hygiene o May shower; gently cleanse wound with antibacterial soap, rinse and pat dry prior to dressing wounds Wound  Treatment Wound #1 - Lower Leg Wound Laterality: Left, Medial Cleanser: Soap and Water 3 x Per Week/30 Days Discharge Instructions: Gently cleanse wound with antibacterial soap, rinse and pat dry prior to dressing wounds Primary Dressing: Hydrofera Blue Ready Transfer Foam, 2.5x2.5 (in/in) 3 x Per Week/30 Days Discharge Instructions: Apply Hydrofera Blue Ready to wound bed as directed Secondary Dressing: [place] Dressing, 4x4 (in/in) 3 x Per Week/30 Days Discharge Instructions: Apply over dressing to secure in place. Electronic Signature(s) Signed: 10/21/2020 4:32:42 PM By: Georges Mouse, Minus Breeding RN Signed: 10/23/2020 4:01:31 PM  By: Worthy Keeler PA-C Entered By: Georges Mouse, Minus Breeding on 10/21/2020 14:34:07 Henry Hill ([id number]) -------------------------------------------------------------------------------- Problem List Details Patient Name: Henry Hill, LAMAGNA. Date of Service: 10/21/2020 2:00 PM Medical Record Number: [id number] Patient Account Number: [id number] Date of Birth/Sex: April 12, 1961 (60 y.o. M) Treating RN: Dolan Amen Primary Care Provider: Vernie Murders Other Clinician: Referring Provider: Vernie Murders Treating Provider/Extender: Skipper Cliche in Treatment: 5 Active Problems ICD-10 Encounter Code Description Active Date MDM Diagnosis I87.2 Venous insufficiency (chronic) (peripheral) 09/15/2020 No Yes L97.828 Non-pressure chronic ulcer of other part of left lower leg with other 09/15/2020 No Yes specified severity I48.0 Paroxysmal atrial fibrillation 09/15/2020 No Yes Z79.01 Long term (current) use of anticoagulants 09/15/2020 No Yes I10 Essential (primary) hypertension 09/15/2020 No Yes Inactive Problems Resolved Problems Electronic Signature(s) Signed: 10/21/2020 2:13:25 PM By: Worthy Keeler PA-C Entered By: Worthy Keeler on 10/21/2020 14:13:25 Henry Hill  ([id number]) -------------------------------------------------------------------------------- Progress Note Details Patient Name: Henry Hill. Date of Service: 10/21/2020 2:00 PM Medical Record Number: [id number] Patient Account Number: [id number] Date of Birth/Sex: 1961/04/22 (60 y.o. M) Treating RN: Dolan Amen Primary Care Provider: Vernie Murders Other Clinician: Referring Provider: Vernie Murders Treating Provider/Extender: Skipper Cliche in Treatment: 5 Subjective Chief Complaint Information obtained from Patient Left LE Ulcer History of Present Illness (HPI) 09/15/2020 upon evaluation today patient appears to be doing somewhat poorly in regard to the wound that he tells me has been dealing with for about a year. Has been working with his primary care provider to try to help get this wound healed. He comes in today as he states that they had "failed" and getting this to completely close. Fortunately there does not appear to be any signs of active infection he did have a lot of eschar and dried drainage on the surface of the wound area. With that being said we were able to carefully remove this today I did not even have to perform debridement. This appears to be doing quite well once removed. He does have a history of chronic venous insufficiency, peripheral vascular disease, atrial fibrillation, long-term use of anticoagulant therapy which has recently been started with Eliquis due to the fact that he was diagnosed with A. fib when he was in the hospital due to a bowel obstruction. He also has hypertension. 09/22/2020 upon evaluation today patient appears to be doing well with regard to his wound. He has been tolerating the dressing changes without complication. Fortunately there is no signs of active infection at this time. No fevers, chills, nausea, vomiting, or diarrhea. 5/23; patient with a wound on the left medial lower extremity. History of significant chronic venous  insufficiency apparently has had procedures done by Dr. Lucky Cowboy he also had an ABI in this clinic of 0.59 he has limiting claudication although I could not quantify this. He is going for noninvasive arterial studies but this is being delayed as he apparently is having a cardiac stress test related to recent atrial fibrillation. He is using collagen 10/07/2020 upon evaluation today patient appears to be doing well in regard to his ankle ulcer. In fact this appears to be doing significantly better and is much smaller. He is going require some sharp debridement but again this is really not significantly problematic as I believe he is for the most part completely healed underneath the majority the area that I want to remove some of the necrotic debris. This seems to be more drainage and dry skin than anything. 10/21/2020 upon evaluation today  patient appears to be doing well with regard to his wound. He has been tolerating the/40 dressing changes without complication. Fortunately there is no signs of active infection at this time. Objective Constitutional Well-nourished and well-hydrated in no acute distress. Vitals Time Taken: 2:00 PM, Height: 72 in, Weight: 170 lbs, BMI: 23.1, Temperature: 97.8 F, Pulse: 75 bpm, Respiratory Rate: 16 breaths/min, Blood Pressure: 176/86 mmHg. Respiratory normal breathing without difficulty. Psychiatric this patient is able to make decisions and demonstrates good insight into disease process. Alert and Oriented x 3. pleasant and cooperative. General Notes: Patient does have a little bit of dry skin around the edges of the wound that is covered over a portion of the wound and tracking some fluid around the perimeter as well. For that reason I did remove that today I did not perform any subcutaneous debridement this was mainly just superficial dry skin to prevent maceration and collection of drainage. Integumentary (Hair, Skin) Wound #1 status is Open. Original cause of  wound was Gradually Appeared. The date acquired was: 08/09/2019. The wound has been in treatment 5 weeks. The wound is located on the Left,Medial Lower Leg. The wound measures 0.2cm length x 0.2cm width x 0.1cm depth; 0.031cm^2 area and 0.003cm^3 volume. There is Fat Layer (Subcutaneous Tissue) exposed. There is no tunneling or undermining noted. There is a small amount of serosanguineous drainage noted. There is small (1-33%) pink granulation within the wound bed. There is a medium (34-66%) amount of necrotic tissue within the wound bed including Eschar. Henry Hill, Henry Hill ([id number]) Assessment Active Problems ICD-10 Venous insufficiency (chronic) (peripheral) Non-pressure chronic ulcer of other part of left lower leg with other specified severity Paroxysmal atrial fibrillation Long term (current) use of anticoagulants Essential (primary) hypertension Procedures Wound #1 Pre-procedure diagnosis of Wound #1 is a Venous Leg Ulcer located on the Left,Medial Lower Leg .Severity of Tissue Pre Debridement is: Fat layer exposed. There was a Selective/Open Wound Skin/Epidermis Debridement with a total area of 0.04 sq cm performed by Tommie Sams., PA-C. With the following instrument(s): Curette to remove Non-Viable tissue/material. Material removed includes Skin: Epidermis and Fibrin/Exudate and. A time out was conducted at 14:30, prior to the start of the procedure. A Minimum amount of bleeding was controlled with Pressure. The procedure was tolerated well. Post Debridement Measurements: 0.2cm length x 0.2cm width x 0.2cm depth; 0.006cm^3 volume. Character of Wound/Ulcer Post Debridement is stable. Severity of Tissue Post Debridement is: Fat layer exposed. Post procedure Diagnosis Wound #1: Same as Pre-Procedure Plan Follow-up Appointments: Return Appointment in 2 weeks. Bathing/ Shower/ Hygiene: May shower; gently cleanse wound with antibacterial soap, rinse and pat dry prior to dressing  wounds WOUND #1: - Lower Leg Wound Laterality: Left, Medial Cleanser: Soap and Water 3 x Per Week/30 Days Discharge Instructions: Gently cleanse wound with antibacterial soap, rinse and pat dry prior to dressing wounds Primary Dressing: Hydrofera Blue Ready Transfer Foam, 2.5x2.5 (in/in) 3 x Per Week/30 Days Discharge Instructions: Apply Hydrofera Blue Ready to wound bed as directed Secondary Dressing: [place] Dressing, 4x4 (in/in) 3 x Per Week/30 Days Discharge Instructions: Apply over dressing to secure in place. 1. Would recommend currently that we continue the [facility] I think that still the best way to go and the patient is in agreement with that plan. 2. I am also can recommend that we have the patient continue with the [place] dressing to cover which I think is appropriate. 3. I would also recommend that the dressing does  get stuck that he can get in the shower to wash this off I think that is an appropriate way to go as well. We will see patient back for reevaluation in 1 week here in the clinic. If anything worsens or changes patient will contact our office for additional recommendations. Electronic Signature(s) Signed: 10/21/2020 2:35:46 PM By: Worthy Keeler PA-C Entered By: Worthy Keeler on 10/21/2020 14:35:46 Henry Hill, Henry Hill ([id number]) -------------------------------------------------------------------------------- SuperBill Details Patient Name: Henry Hill Date of Service: 10/21/2020 Medical Record Number: [id number] Patient Account Number: [id number] Date of Birth/Sex: [birth year] (60 y.o. M) Treating RN: Dolan Amen Primary Care Provider: Vernie Murders Other Clinician: Referring Provider: Vernie Murders Treating Provider/Extender: Skipper Cliche in Treatment: 5 Diagnosis Coding ICD-10 Codes Code Description I87.2 Venous insufficiency (chronic) (peripheral) L97.828 Non-pressure chronic ulcer of other part of left lower leg with  other specified severity I48.0 Paroxysmal atrial fibrillation Z79.01 Long term (current) use of anticoagulants I10 Essential (primary) hypertension Facility Procedures CPT4 Code: [id number] Description: [phone] - DEBRIDE WOUND 1ST 20 SQ CM OR < Modifier: Quantity: 1 CPT4 Code: Description: ICD-10 Diagnosis Description L97.828 Non-pressure chronic ulcer of other part of left lower leg with other speci Modifier: fied severity Quantity: Physician Procedures CPT4 Code: [id number] Description: [id number] - WC PHYS DEBR WO ANESTH 20 SQ CM Modifier: Quantity: 1 CPT4 Code: Description: ICD-10 Diagnosis Description L97.828 Non-pressure chronic ulcer of other part of left lower leg with other speci Modifier: fied severity Quantity: Electronic Signature(s) Signed: 10/21/2020 2:35:53 PM By: Worthy Keeler PA-C Entered By: Worthy Keeler on 10/21/2020 14:35:53

## 2020-10-21 NOTE — Progress Notes (Signed)
Henry Hill ([id number]) Visit Report for 10/21/2020 Arrival Information Details Patient Name: Henry Hill, Henry Hill. Date of Service: 10/21/2020 2:00 PM Medical Record Number: [id number] Patient Account Number: [id number] Date of Birth/Sex: Aug 18, 1960 (61 y.o. M) Treating RN: Donnamarie Poag Primary Care Henry Hill: Henry Hill Other Clinician: Referring Nawal Burling: Henry Hill Treating Nicole Defino/Extender: Skipper Cliche in Treatment: 5 Visit Information History Since Last Visit Added or deleted any medications: No Patient Arrived: Ambulatory Had a fall or experienced change in No Arrival Time: 13:56 activities of daily living that may affect Accompanied By: self risk of falls: Transfer Assistance: None Hospitalized since last visit: No Patient Identification Verified: Yes Has Dressing in Place as Prescribed: Yes Secondary Verification Process Completed: Yes Pain Present Now: No Patient Requires Transmission-Based No Precautions: Patient Has Alerts: Yes Patient Alerts: Patient on Blood Thinner ELIQUIS NOT A DIABETIC Electronic Signature(s) Signed: 10/21/2020 4:29:31 PM By: Donnamarie Poag Entered By: Donnamarie Poag on 10/21/2020 13:59:02 Henry Hill ([id number]) -------------------------------------------------------------------------------- Clinic Level of Care Assessment Details Patient Name: Henry Hill. Date of Service: 10/21/2020 2:00 PM Medical Record Number: [id number] Patient Account Number: [id number] Date of Birth/Sex: 03-09-1961 (60 y.o. M) Treating RN: Dolan Amen Primary Care Henry Hill: Henry Hill Other Clinician: Referring Henry Hill: Henry Hill Treating Jaskarn Schweer/Extender: Skipper Cliche in Treatment: 5 Clinic Level of Care Assessment Items TOOL 1 Quantity Score '[]'  - Use when EandM and Procedure is performed on INITIAL visit 0 ASSESSMENTS - Nursing Assessment / Reassessment '[]'  - General Physical Exam (combine w/ comprehensive assessment (listed  just below) when performed on new 0 pt. evals) '[]'  - 0 Comprehensive Assessment (HX, ROS, Risk Assessments, Wounds Hx, etc.) ASSESSMENTS - Wound and Skin Assessment / Reassessment '[]'  - Dermatologic / Skin Assessment (not related to wound area) 0 ASSESSMENTS - Ostomy and/or Continence Assessment and Care '[]'  - Incontinence Assessment and Management 0 '[]'  - 0 Ostomy Care Assessment and Management (repouching, etc.) PROCESS - Coordination of Care '[]'  - Simple Patient / Family Education for ongoing care 0 '[]'  - 0 Complex (extensive) Patient / Family Education for ongoing care '[]'  - 0 Staff obtains Programmer, systems, Records, Test Results / Process Orders '[]'  - 0 Staff telephones HHA, Nursing Homes / Clarify orders / etc '[]'  - 0 Routine Transfer to another Facility (non-emergent condition) '[]'  - 0 Routine Hospital Admission (non-emergent condition) '[]'  - 0 New Admissions / Biomedical engineer / Ordering NPWT, Apligraf, etc. '[]'  - 0 Emergency Hospital Admission (emergent condition) PROCESS - Special Needs '[]'  - Pediatric / Minor Patient Management 0 '[]'  - 0 Isolation Patient Management '[]'  - 0 Hearing / Language / Visual special needs '[]'  - 0 Assessment of Community assistance (transportation, D/C planning, etc.) '[]'  - 0 Additional assistance / Altered mentation '[]'  - 0 Support Surface(s) Assessment (bed, cushion, seat, etc.) INTERVENTIONS - Miscellaneous '[]'  - External ear exam 0 '[]'  - 0 Patient Transfer (multiple staff / Civil Service fast streamer / Similar devices) '[]'  - 0 Simple Staple / Suture removal (25 or less) '[]'  - 0 Complex Staple / Suture removal (26 or more) '[]'  - 0 Hypo/Hyperglycemic Management (do not check if billed separately) '[]'  - 0 Ankle / Brachial Index (ABI) - do not check if billed separately Has the patient been seen at the hospital within the last three years: Yes Total Score: 0 Level Of Care: ____ Henry Hill ([id number]) Electronic Signature(s) Signed: 10/21/2020 4:32:42 PM By:  Georges Mouse, Minus Breeding RN Entered By: Georges Mouse, Minus Breeding on 10/21/2020 14:34:11 Henry Hill ([id number]) -------------------------------------------------------------------------------- Encounter Discharge Information  Details Patient Name: Henry Hill. Date of Service: 10/21/2020 2:00 PM Medical Record Number: [id number] Patient Account Number: [id number] Date of Birth/Sex: 1961-04-20 (60 y.o. M) Treating RN: Donnamarie Poag Primary Care Keijuan Schellhase: Henry Hill Other Clinician: Referring Henry Hill: Henry Hill Treating Lamaj Metoyer/Extender: Skipper Cliche in Treatment: 5 Encounter Discharge Information Items Post Procedure Vitals Discharge Condition: Stable Temperature (F): 97.8 Ambulatory Status: Ambulatory Pulse (bpm): 75 Discharge Destination: Home Respiratory Rate (breaths/min): 16 Transportation: Private Auto Blood Pressure (mmHg): 176/86 Accompanied By: self Schedule Follow-up Appointment: Yes Clinical Summary of Care: Electronic Signature(s) Signed: 10/21/2020 4:29:31 PM By: Donnamarie Poag Entered By: Donnamarie Poag on 10/21/2020 14:38:52 Henry Hill ([id number]) -------------------------------------------------------------------------------- Lower Extremity Assessment Details Patient Name: Henry Hill. Date of Service: 10/21/2020 2:00 PM Medical Record Number: [id number] Patient Account Number: [id number] Date of Birth/Sex: [birth year] (60 y.o. M) Treating RN: Donnamarie Poag Primary Care Shaaron Golliday: Henry Hill Other Clinician: Referring Henry Hill: Henry Hill Treating Gissel Keilman/Extender: Skipper Cliche in Treatment: 5 Edema Assessment Assessed: [Left: Yes] [Right: No] Edema: [Left: Ye] [Right: s] Calf Left: Right: Point of Measurement: 31 cm From Medial Instep 35 cm Ankle Left: Right: Point of Measurement: 12 cm From Medial Instep 25 cm Knee To Floor Left: Right: From Medial Instep 44 cm Electronic Signature(s) Signed: 10/21/2020 4:29:31 PM  By: Donnamarie Poag Entered By: Donnamarie Poag on 10/21/2020 14:02:37 Henry Hill ([id number]) -------------------------------------------------------------------------------- Multi Wound Chart Details Patient Name: Henry Hill. Date of Service: 10/21/2020 2:00 PM Medical Record Number: [id number] Patient Account Number: [id number] Date of Birth/Sex: Apr 03, 1961 (60 y.o. M) Treating RN: Dolan Amen Primary Care Levert Heslop: Henry Hill Other Clinician: Referring Jawad Wiacek: Henry Hill Treating Deaundra Dupriest/Extender: Skipper Cliche in Treatment: 5 Vital Signs Height(in): 72 Pulse(bpm): 75 Weight(lbs): 170 Blood Pressure(mmHg): 176/86 Body Mass Index(BMI): 23 Temperature(F): 97.8 Respiratory Rate(breaths/min): 16 Photos: [N/A:N/A] Wound Location: Left, Medial Lower Leg N/A N/A Wounding Event: Gradually Appeared N/A N/A Primary Etiology: Venous Leg Ulcer N/A N/A Comorbid History: Arrhythmia, Hypertension N/A N/A Date Acquired: 08/09/2019 N/A N/A Weeks of Treatment: 5 N/A N/A Wound Status: Open N/A N/A Measurements L x W x D (cm) 0.2x0.2x0.1 N/A N/A Area (cm) : 0.031 N/A N/A Volume (cm) : 0.003 N/A N/A % Reduction in Area: 99.00% N/A N/A % Reduction in Volume: 99.00% N/A N/A Classification: Full Thickness Without Exposed N/A N/A Support Structures Exudate Amount: Small N/A N/A Exudate Type: Serosanguineous N/A N/A Exudate Color: red, brown N/A N/A Granulation Amount: Small (1-33%) N/A N/A Granulation Quality: Pink N/A N/A Necrotic Amount: Medium (34-66%) N/A N/A Necrotic Tissue: Eschar N/A N/A Exposed Structures: Fat Layer (Subcutaneous Tissue): N/A N/A Yes Fascia: No Tendon: No Muscle: No Joint: No Bone: No Epithelialization: Medium (34-66%) N/A N/A Treatment Notes Electronic Signature(s) Signed: 10/21/2020 4:32:42 PM By: Georges Mouse, Minus Breeding RN Entered By: Georges Mouse, Minus Breeding on 10/21/2020 14:29:35 Henry Hill  ([id number]) -------------------------------------------------------------------------------- [place] Details Patient Name: SULIMAN, TERMINI. Date of Service: 10/21/2020 2:00 PM Medical Record Number: [id number] Patient Account Number: [id number] Date of Birth/Sex: [birth year] (60 y.o. M) Treating RN: Dolan Amen Primary Care Valdez Brannan: Henry Hill Other Clinician: Referring Jayvian Escoe: Henry Hill Treating Kiaja Shorty/Extender: Skipper Cliche in Treatment: 5 Active Inactive Wound/Skin Impairment Nursing Diagnoses: Impaired tissue integrity Knowledge deficit related to ulceration/compromised skin integrity Goals: Patient/caregiver will verbalize understanding of skin care regimen Date Initiated: 09/15/2020 Date Inactivated: 09/22/2020 Target Resolution Date: 10/17/2020 Goal Status: Met Ulcer/skin breakdown will have a volume reduction of 30% by week 4 Date Initiated: 09/15/2020 Target Resolution Date: 10/13/2020  Goal Status: Active Ulcer/skin breakdown will have a volume reduction of 50% by week 8 Date Initiated: 09/15/2020 Target Resolution Date: 11/10/2020 Goal Status: Active Interventions: Assess patient/caregiver ability to obtain necessary supplies Assess patient/caregiver ability to perform ulcer/skin care regimen upon admission and as needed Assess ulceration(s) every visit Treatment Activities: Skin care regimen initiated : 09/15/2020 Notes: Electronic Signature(s) Signed: 10/21/2020 4:32:42 PM By: Georges Mouse, Minus Breeding RN Entered By: Georges Mouse, Minus Breeding on 10/21/2020 14:29:29 Henry Hill ([id number]) -------------------------------------------------------------------------------- Pain Assessment Details Patient Name: Henry Hill. Date of Service: 10/21/2020 2:00 PM Medical Record Number: [id number] Patient Account Number: [id number] Date of Birth/Sex: 07-13-1960 (60 y.o. M) Treating RN: Donnamarie Poag Primary Care Haiden Clucas: Henry Hill Other  Clinician: Referring [place]: Henry Hill Treating Karmela Bram/Extender: Skipper Cliche in Treatment: 5 Active Problems Location of Pain Severity and Description of Pain Patient Has Paino No Site Locations Rate the pain. Current Pain Level: 0 Pain Management and Medication Current Pain Management: Electronic Signature(s) Signed: 10/21/2020 4:29:31 PM By: Donnamarie Poag Entered By: Donnamarie Poag on 10/21/2020 13:59:54 Henry Hill ([id number]) -------------------------------------------------------------------------------- Patient/Caregiver Education Details Patient Name: Henry Hill Date of Service: 10/21/2020 2:00 PM Medical Record Number: [id number] Patient Account Number: [id number] Date of Birth/Gender: 1960-06-24 (60 y.o. M) Treating RN: Dolan Amen Primary Care Physician: Henry Hill Other Clinician: Referring Physician: Vernie Hill Treating Physician/Extender: Skipper Cliche in Treatment: 5 Education Assessment Education Provided To: Patient Education Topics Provided Wound/Skin Impairment: Methods: Explain/Verbal Responses: State content correctly Electronic Signature(s) Signed: 10/21/2020 4:32:42 PM By: Georges Mouse, Minus Breeding RN Entered By: Georges Mouse, Minus Breeding on 10/21/2020 14:34:24 Henry Hill ([id number]) -------------------------------------------------------------------------------- Wound Assessment Details Patient Name: KEAHI, MCCARNEY. Date of Service: 10/21/2020 2:00 PM Medical Record Number: [id number] Patient Account Number: [id number] Date of Birth/Sex: Jan 08, 1961 (60 y.o. M) Treating RN: Donnamarie Poag Primary Care Teirra Carapia: Henry Hill Other Clinician: Referring Baylynn Shifflett: Henry Hill Treating Ezra Marquess/Extender: Skipper Cliche in Treatment: 5 Wound Status Wound Number: 1 Primary Etiology: Venous Leg Ulcer Wound Location: Left, Medial Lower Leg Wound Status: Open Wounding Event: Gradually Appeared Comorbid History:  Arrhythmia, Hypertension Date Acquired: 08/09/2019 Weeks Of Treatment: 5 Clustered Wound: No Photos Wound Measurements Length: (cm) 0.2 Width: (cm) 0.2 Depth: (cm) 0.1 Area: (cm) 0.031 Volume: (cm) 0.003 % Reduction in Area: 99% % Reduction in Volume: 99% Epithelialization: Medium (34-66%) Tunneling: No Undermining: No Wound Description Classification: Full Thickness Without Exposed Support Structu Exudate Amount: Small Exudate Type: Serosanguineous Exudate Color: red, brown res Foul Odor After Cleansing: No Slough/Fibrino Yes Wound Bed Granulation Amount: Small (1-33%) Exposed Structure Granulation Quality: Pink Fascia Exposed: No Necrotic Amount: Medium (34-66%) Fat Layer (Subcutaneous Tissue) Exposed: Yes Necrotic Quality: Eschar Tendon Exposed: No Muscle Exposed: No Joint Exposed: No Bone Exposed: No Treatment Notes Wound #1 (Lower Leg) Wound Laterality: Left, Medial Cleanser Soap and Water Discharge Instruction: Gently cleanse wound with antibacterial soap, rinse and pat dry prior to dressing wounds Peri-Wound Care KAM, RAHIMI ([id number]) Topical Primary Dressing Hydrofera Blue Ready Transfer Foam, 2.5x2.5 (in/in) Discharge Instruction: Apply Hydrofera Blue Ready to wound bed as directed Secondary Dressing [place], 4x4 (in/in) Discharge Instruction: Apply over dressing to secure in place. Secured With Compression Wrap Compression Stockings Add-Ons Electronic Signature(s) Signed: 10/21/2020 4:29:31 PM By: Donnamarie Poag Entered By: Donnamarie Poag on 10/21/2020 14:01:32 Henry Hill ([id number]) -------------------------------------------------------------------------------- Vitals Details Patient Name: Henry Hill. Date of Service: 10/21/2020 2:00 PM Medical Record Number: [id number] Patient Account Number: [id number] Date of Birth/Sex: 1960/08/05 (60 y.o.  M) Treating RN: Donnamarie Poag Primary Care Edla Para: Henry Hill Other  Clinician: Referring Yossi Hinchman: Henry Hill Treating Abou Sterkel/Extender: Skipper Cliche in Treatment: 5 Vital Signs Time Taken: 14:00 Temperature (F): 97.8 Height (in): 72 Pulse (bpm): 75 Weight (lbs): 170 Respiratory Rate (breaths/min): 16 Body Mass Index (BMI): 23.1 Blood Pressure (mmHg): 176/86 Reference Range: 80 - 120 mg / dl Electronic Signature(s) Signed: 10/21/2020 4:29:31 PM By: Donnamarie Poag Entered ByDonnamarie Poag on 10/21/2020 13:59:48

## 2020-10-27 ENCOUNTER — Other Ambulatory Visit: Payer: Self-pay

## 2020-10-28 ENCOUNTER — Telehealth: Payer: Self-pay

## 2020-10-28 ENCOUNTER — Other Ambulatory Visit: Payer: Self-pay

## 2020-10-28 ENCOUNTER — Ambulatory Visit (INDEPENDENT_AMBULATORY_CARE_PROVIDER_SITE_OTHER): Payer: 59 | Admitting: Gastroenterology

## 2020-10-28 ENCOUNTER — Encounter: Payer: Self-pay | Admitting: Gastroenterology

## 2020-10-28 VITALS — BP 166/96 | HR 80 | Temp 98.2°F | Ht 72.0 in | Wt 179.0 lb

## 2020-10-28 DIAGNOSIS — Z9049 Acquired absence of other specified parts of digestive tract: Secondary | ICD-10-CM | POA: Diagnosis not present

## 2020-10-28 DIAGNOSIS — K562 Volvulus: Secondary | ICD-10-CM

## 2020-10-28 DIAGNOSIS — I700 Atherosclerosis of aorta: Secondary | ICD-10-CM

## 2020-10-28 MED ORDER — NA SULFATE-K SULFATE-MG SULF 17.5-3.13-1.6 GM/177ML PO SOLN: 354.0000 mL | Freq: Once | ORAL | 0 refills | Status: AC

## 2020-10-28 NOTE — Telephone Encounter (Signed)
   [facility] Pre-operative Risk Assessment    Request for surgical clearance:  What type of surgery is being performed? Colonoscopy    When is this surgery scheduled? 11/20/2020   Are there any medications that need to be held prior to surgery and how long?Xarelto 11m    Practice name and name of physician performing surgery? [facility]. VMarius Ditch  What is your office phone and fax number? [phone]  Anesthesia type (None, local, MAC, general) ? General    AShelby Mattocks6/21/2022, 3:22 PM  _________________________________________________________________   (provider comments below)

## 2020-10-28 NOTE — Progress Notes (Signed)
Cephas Darby, MD [address]  [place]  [place], [place] [address]  Main: [phone]  Fax: [phone]    Gastroenterology Consultation  Referring Provider:     Margo Common, PA-C Primary Care Physician:  Margo Common, PA-C Primary Gastroenterologist:  Dr. Cephas Darby Reason for Consultation:     History of cecal volvulus status post right hemicolectomy        HPI:   Henry Hill is a 60 y.o. male referred by Dr. 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  Patient is doing well postoperatively.  He reports having 3-4 soft bowel movements daily during the day, he takes Imodium as needed.  He denies nocturnal diarrhea.  Certain foods resulted in worsening of diarrhea and cause postprandial urgency.  His weight has been stable.  He is planning to go back to work tomorrow.  He does drink beer 5-6 daily since his high school.  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  Patient underwent nuclear stress test which was most likely normal but study was kind of limited  NSAIDs: None  Antiplts/Anticoagulants/Anti thrombotics: Xarelto for history of A. fib  GI Procedures: None  Right hemicolectomy, surgical specimen 08/30/2020 DIAGNOSIS:  A. 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  - VIABLE PROXIMAL ILEAL MARGIN WITH NO SIGNIFICANT HISTOPATHOLOGIC  CHANGE.  - MULTIPLE FRAGMENTS OF TUBULAR ADENOMAS.  - SINGLE BENIGN SUBMUCOSAL LIPOMA.  - ILEOCECAL VALVE WITH ISCHEMIC CHANGES.  - BENIGN VERMIFORM APPENDIX.  - MILD FIBROUS AND  FIBRINOUS SEROSAL ADHESIONS.  - TEN BENIGN LYMPH NODES.  - NEGATIVE FOR HIGH-GRADE DYSPLASIA AND MALIGNANCY.   Past Medical History:  Diagnosis Date   Cecal volvulus ([place])    a. 08/2020 s/p ex lap & R colectomy.   Diastolic dysfunction    a 08/2020 Echo: EF 50-55%, no rwma, Gr1 DD, nl RV size/fxn, triv MR.   Essential hypertension    ETOH abuse    Persistent atrial fibrillation ([place])    a. Dx 08/2020 in setting of cecal volvulus s/p R colectomy.  CHA2DS@VASc  = 1.   Tobacco abuse    Varicose veins of both lower extremities     Past Surgical History:  Procedure Laterality Date   BACK SURGERY  2003   HEMORRHOID SURGERY N/A 11/13/2014   Procedure: LEFT LATERAL HEMORRHOIDECTOMY  ;  Surgeon: [name], MD;  Location: [facility];  Service: General;  Laterality: N/A;   LAPAROTOMY Right 08/30/2020   Procedure: EXPLORATORY LAPAROTOMY WITH RIGHT COLOCTOMY;  Surgeon: [name], MD;  Location: [facility] ORS;  Service: General;  Laterality: Right;    Current Outpatient Medications:    metoprolol tartrate (LOPRESSOR) 50 MG tablet, Take 1.5 tablets (75 mg total) by mouth 2 (two) times daily., Disp: 90 tablet, Rfl: 0   Multiple Vitamins-Minerals (MULTIVITAMIN ADULT PO), Take 1 tablet by mouth daily., Disp: , Rfl:    Na Sulfate-K Sulfate-Mg Sulf 17.5-3.13-1.6 GM/177ML SOLN, Take 354 mLs by mouth once for 1 dose., Disp: 354 mL, Rfl: 0   rivaroxaban (XARELTO) 20 MG TABS tablet, Take 1 tablet (20 mg total) by mouth daily with supper., Disp: 30 tablet, Rfl: 11  sildenafil (VIAGRA) 50 MG tablet, TAKE 1 TABLET BY MOUTH DAILY AS NEEDED FOR ERECTILE DYSFUNCTION, Disp: 6 tablet, Rfl: 3   acetaminophen (TYLENOL) 500 MG tablet, Take 1,000 mg by mouth every 6 (six) hours as needed for mild pain, moderate pain or headache. (Patient not taking: Reported on 10/28/2020), Disp: , Rfl:    diltiazem (CARDIZEM CD) 180 MG 24 hr capsule, Take 180 mg by mouth daily. (Patient not taking: Reported on 10/28/2020), Disp: , Rfl:      Family History  Problem Relation Age of Onset   Heart attack Mother    Diabetes Father    Hypertension Father      Social History   Tobacco Use   Smoking status: Every Day    Packs/day: 0.50    Pack years: 0.00    Types: Cigarettes   Smokeless tobacco: Never  Substance Use Topics   Alcohol use: Yes    Alcohol/week: 20.0 standard drinks    Types: 20 Cans of beer per week    Comment: MODERATE USE   Drug use: No    Allergies as of 10/28/2020   (No Known Allergies)    Review of Systems:    All systems reviewed and negative except where noted in HPI.   Physical Exam:  BP (!) 166/96 (BP Location: Left Arm, Patient Position: Sitting, Cuff Size: Normal)   Pulse 80   Temp 98.2 F (36.8 C) (Oral)   Ht 6' (1.829 m)   Wt 179 lb (81.2 kg)   BMI 24.28 kg/m  No LMP for male patient.  General:   Alert,  Well-developed, well-nourished, pleasant and cooperative in NAD Head:  Normocephalic and atraumatic. Eyes:  Sclera clear, no icterus.   Conjunctiva pink. Ears:  Normal auditory acuity. Nose:  No deformity, discharge, or lesions. Mouth:  No deformity or lesions,oropharynx pink & moist. Neck:  Supple; no masses or thyromegaly. Lungs:  Respirations even and unlabored.  Clear throughout to auscultation.   No wheezes, crackles, or rhonchi. No acute distress. Heart:  Regular rate and rhythm; no murmurs, clicks, rubs, or gallops. Abdomen:  Normal bowel sounds. Soft, midline vertical scar, well-healed, non-tender and non-distended without masses, hepatosplenomegaly or hernias noted.  No guarding or rebound tenderness.   Rectal: Not performed Msk:  Symmetrical without gross deformities. Good, equal movement & strength bilaterally. Pulses:  Normal pulses noted. Extremities:  No clubbing or edema.  No cyanosis. Neurologic:  Alert and oriented x3;  grossly normal neurologically. Skin:  Intact without significant lesions or rashes. No jaundice. Psych:  Alert and cooperative. Normal  mood and affect.  Imaging Studies: Reviewed  Assessment and Plan:   Henry Hill is a 60 y.o. pleasant Caucasian male with history of chronic alcohol use, chronic tobacco use, A. fib on Xarelto, history of cecal volvulus status post right hemicolectomy in 4/23, tubulovillous adenoma of the colon and surgical specimen is referred to discuss about colonoscopy  Recommend diagnostic colonoscopy after cardiac clearance and also obtain interruption of anticoagulation during periprocedural period I discussed with patient regarding the long-term consequences of chronic alcohol use including chronic liver disease and patient is not willing to stop drinking alcohol at this time   Follow up based on the colonoscopy results   Cephas Darby, MD

## 2020-10-28 NOTE — Telephone Encounter (Signed)
Pharmacy, can you please comment on how long Xarelto can be held for upcoming colonoscopy?  Thank you!

## 2020-10-29 DIAGNOSIS — I700 Atherosclerosis of aorta: Secondary | ICD-10-CM | POA: Insufficient documentation

## 2020-10-29 NOTE — Telephone Encounter (Signed)
Patient with diagnosis of afib on Xarelto 20mg  daily for anticoagulation.    Procedure: colonoscopy Date of procedure: 11/20/20  CHA2DS2-VASc Score = 2  This indicates a 2.2% annual risk of stroke. The patient's score is based upon: CHF History: No HTN History: Yes Diabetes History: No Stroke History: No Vascular Disease History: Yes Age Score: 0 Gender Score: 0   CrCl >194mL/min Platelet count 344K  Per office protocol, patient can hold Xarelto for 1-2 days prior to procedure.

## 2020-10-30 ENCOUNTER — Telehealth: Payer: Self-pay

## 2020-10-30 NOTE — Telephone Encounter (Signed)
   Name: Henry Hill  DOB: 12/03/1960  MRN: [id number]   Primary Cardiologist: [name], MD  Chart reviewed as part of pre-operative protocol coverage. We have been asked for guidance to hold xarelto for colonoscopy.   Per office protocol, pt may hold xarelto for 1-2 days prior to procedure.   I will route this recommendation to the requesting party via Epic fax function and remove from pre-op pool. Please call with questions.  Tami Lin Gwenivere Hiraldo, PA 10/30/2020, 8:34 AM

## 2020-10-30 NOTE — Telephone Encounter (Signed)
Cardiologist got back about stopping his Henry Hill. They want him to stop it 2 days prior to procedure and then restart it 1 day after procedure. Patient verablized understanding.  Patient states his wife can not get off work on 11/20/2020 and wants to move it to 11/24/2020. Called Trish and got procedure moved.

## 2020-11-03 ENCOUNTER — Ambulatory Visit (INDEPENDENT_AMBULATORY_CARE_PROVIDER_SITE_OTHER): Payer: 59

## 2020-11-03 ENCOUNTER — Other Ambulatory Visit: Payer: Self-pay

## 2020-11-03 DIAGNOSIS — I739 Peripheral vascular disease, unspecified: Secondary | ICD-10-CM

## 2020-11-04 ENCOUNTER — Encounter: Payer: 59 | Admitting: Physician Assistant

## 2020-11-04 DIAGNOSIS — I872 Venous insufficiency (chronic) (peripheral): Secondary | ICD-10-CM | POA: Diagnosis not present

## 2020-11-04 NOTE — Progress Notes (Signed)
Henry Hill, Henry Hill ([id number]) Visit Report for 11/04/2020 Arrival Information Details Patient Name: Henry Hill, Henry Hill. Date of Service: 11/04/2020 10:30 AM Medical Record Number: [id number] Patient Account Number: [id number] Date of Birth/Sex: 24-Apr-1961 (60 y.o. M) Treating RN: Donnamarie Poag Primary Care Abbiegail Landgren: Vernie Murders Other Clinician: Referring Ayiana Winslett: Vernie Murders Treating Vina Byrd/Extender: Skipper Cliche in Treatment: 7 Visit Information History Since Last Visit Added or deleted any medications: No Patient Arrived: Ambulatory Had a fall or experienced change in No Arrival Time: 10:43 activities of daily living that may affect Accompanied By: self risk of falls: Transfer Assistance: None Hospitalized since last visit: No Patient Identification Verified: Yes Has Dressing in Place as Prescribed: Yes Secondary Verification Process Completed: Yes Pain Present Now: No Patient Requires Transmission-Based No Precautions: Patient Has Alerts: Yes Patient Alerts: Patient on Blood Thinner ELIQUIS NOT A DIABETIC Electronic Signature(s) Signed: 11/04/2020 1:52:30 PM By: Donnamarie Poag Entered By: Donnamarie Poag on 11/04/2020 10:43:20 Henry Hill ([id number]) -------------------------------------------------------------------------------- Clinic Level of Care Assessment Details Patient Name: Henry Hill Date of Service: 11/04/2020 10:30 AM Medical Record Number: [id number] Patient Account Number: [id number] Date of Birth/Sex: 11-Oct-1960 (60 y.o. M) Treating RN: Dolan Amen Primary Care Daronte Shostak: Vernie Murders Other Clinician: Referring Brad Mcgaughy: Vernie Murders Treating Aleicia Kenagy/Extender: Skipper Cliche in Treatment: 7 Clinic Level of Care Assessment Items TOOL 4 Quantity Score X - Use when only an EandM is performed on FOLLOW-UP visit 1 0 ASSESSMENTS - Nursing Assessment / Reassessment X - Reassessment of Co-morbidities (includes updates in patient status)  1 10 X- 1 5 Reassessment of Adherence to Treatment Plan ASSESSMENTS - Wound and Skin Assessment / Reassessment X - Simple Wound Assessment / Reassessment - one wound 1 5 '[]'  - 0 Complex Wound Assessment / Reassessment - multiple wounds '[]'  - 0 Dermatologic / Skin Assessment (not related to wound area) ASSESSMENTS - Focused Assessment '[]'  - Circumferential Edema Measurements - multi extremities 0 '[]'  - 0 Nutritional Assessment / Counseling / Intervention '[]'  - 0 Lower Extremity Assessment (monofilament, tuning fork, pulses) '[]'  - 0 Peripheral Arterial Disease Assessment (using hand held doppler) ASSESSMENTS - Ostomy and/or Continence Assessment and Care '[]'  - Incontinence Assessment and Management 0 '[]'  - 0 Ostomy Care Assessment and Management (repouching, etc.) PROCESS - Coordination of Care X - Simple Patient / Family Education for ongoing care 1 15 '[]'  - 0 Complex (extensive) Patient / Family Education for ongoing care '[]'  - 0 Staff obtains Programmer, systems, Records, Test Results / Process Orders '[]'  - 0 Staff telephones HHA, Nursing Homes / Clarify orders / etc '[]'  - 0 Routine Transfer to another Facility (non-emergent condition) '[]'  - 0 Routine Hospital Admission (non-emergent condition) '[]'  - 0 New Admissions / Biomedical engineer / Ordering NPWT, Apligraf, etc. '[]'  - 0 Emergency Hospital Admission (emergent condition) X- 1 10 Simple Discharge Coordination '[]'  - 0 Complex (extensive) Discharge Coordination PROCESS - Special Needs '[]'  - Pediatric / Minor Patient Management 0 '[]'  - 0 Isolation Patient Management '[]'  - 0 Hearing / Language / Visual special needs '[]'  - 0 Assessment of Community assistance (transportation, D/C planning, etc.) '[]'  - 0 Additional assistance / Altered mentation '[]'  - 0 Support Surface(s) Assessment (bed, cushion, seat, etc.) INTERVENTIONS - Wound Cleansing / Measurement Lundstrom, Abenezer C. ([id number]) X- 1 5 Simple Wound Cleansing - one wound '[]'  -  0 Complex Wound Cleansing - multiple wounds X- 1 5 Wound Imaging (photographs - any number of wounds) '[]'  - 0 Wound Tracing (instead of photographs) X- 1 5 Simple  Wound Measurement - one wound '[]'  - 0 Complex Wound Measurement - multiple wounds INTERVENTIONS - Wound Dressings '[]'  - Small Wound Dressing one or multiple wounds 0 X- 1 15 Medium Wound Dressing one or multiple wounds '[]'  - 0 Large Wound Dressing one or multiple wounds '[]'  - 0 Application of Medications - topical '[]'  - 0 Application of Medications - injection INTERVENTIONS - Miscellaneous '[]'  - External ear exam 0 '[]'  - 0 Specimen Collection (cultures, biopsies, blood, body fluids, etc.) '[]'  - 0 Specimen(s) / Culture(s) sent or taken to Lab for analysis '[]'  - 0 Patient Transfer (multiple staff / Civil Service fast streamer / Similar devices) '[]'  - 0 Simple Staple / Suture removal (25 or less) '[]'  - 0 Complex Staple / Suture removal (26 or more) '[]'  - 0 Hypo / Hyperglycemic Management (close monitor of Blood Glucose) '[]'  - 0 Ankle / Brachial Index (ABI) - do not check if billed separately X- 1 5 Vital Signs Has the patient been seen at the hospital within the last three years: Yes Total Score: 80 Level Of Care: New/Established - Level 3 Electronic Signature(s) Signed: 11/04/2020 4:55:48 PM By: Georges Mouse, Minus Breeding RN Entered By: Georges Mouse, Minus Breeding on 11/04/2020 [place], [place]. ([id number]) -------------------------------------------------------------------------------- Encounter Discharge Information Details Patient Name: Henry Hill. Date of Service: 11/04/2020 10:30 AM Medical Record Number: [id number] Patient Account Number: [id number] Date of Birth/Sex: 1960-08-29 (60 y.o. M) Treating RN: Donnamarie Poag Primary Care Mahmud Keithly: Vernie Murders Other Clinician: Referring Karla Vines: Vernie Murders Treating Kion Huntsberry/Extender: Skipper Cliche in Treatment: 7 Encounter Discharge Information Items Discharge Condition:  Stable Ambulatory Status: Ambulatory Discharge Destination: Home Transportation: Private Auto Accompanied By: self Schedule Follow-up Appointment: Yes Clinical Summary of Care: Electronic Signature(s) Signed: 11/04/2020 1:52:30 PM By: Donnamarie Poag Entered By: Donnamarie Poag on 11/04/2020 11:21:03 Henry Hill ([id number]) -------------------------------------------------------------------------------- Lower Extremity Assessment Details Patient Name: Henry Hill, Henry C. Date of Service: 11/04/2020 10:30 AM Medical Record Number: [id number] Patient Account Number: [id number] Date of Birth/Sex: 18-Apr-1961 (60 y.o. M) Treating RN: Donnamarie Poag Primary Care Bernedette Auston: Vernie Murders Other Clinician: Referring Shawntay Prest: Vernie Murders Treating Harolyn Cocker/Extender: Skipper Cliche in Treatment: 7 Edema Assessment Assessed: [Left: Yes] [Right: No] Edema: [Left: N] [Right: o] Calf Left: Right: Point of Measurement: 31 cm From Medial Instep 34 cm Ankle Left: Right: Point of Measurement: 12 cm From Medial Instep 24.5 cm Electronic Signature(s) Signed: 11/04/2020 1:52:30 PM By: Donnamarie Poag Entered By: Donnamarie Poag on 11/04/2020 10:48:26 Willcox, Jillene Bucks ([id number]) -------------------------------------------------------------------------------- Multi Wound Chart Details Patient Name: Henry Hill. Date of Service: 11/04/2020 10:30 AM Medical Record Number: [id number] Patient Account Number: [id number] Date of Birth/Sex: [birth year] (60 y.o. M) Treating RN: Dolan Amen Primary Care Jalexa Pifer: Vernie Murders Other Clinician: Referring Laderrick Wilk: Vernie Murders Treating Tyrion Glaude/Extender: Skipper Cliche in Treatment: 7 Vital Signs Height(in): 72 Pulse(bpm): 75 Weight(lbs): 170 Blood Pressure(mmHg): 150/82 Body Mass Index(BMI): 23 Temperature(F): 98.2 Respiratory Rate(breaths/min): 16 Photos: [N/A:N/A] Wound Location: Left, Medial Lower Leg N/A N/A Wounding Event: Gradually  Appeared N/A N/A Primary Etiology: Venous Leg Ulcer N/A N/A Comorbid History: Arrhythmia, Hypertension N/A N/A Date Acquired: 08/09/2019 N/A N/A Weeks of Treatment: 7 N/A N/A Wound Status: Open N/A N/A Measurements L x W x D (cm) 0.2x0.2x0.1 N/A N/A Area (cm) : 0.031 N/A N/A Volume (cm) : 0.003 N/A N/A % Reduction in Area: 99.00% N/A N/A % Reduction in Volume: 99.00% N/A N/A Classification: Full Thickness Without Exposed N/A N/A Support Structures Exudate Amount: Small N/A N/A Exudate Type: Serosanguineous N/A N/A  Exudate Color: red, brown N/A N/A Granulation Amount: Small (1-33%) N/A N/A Granulation Quality: Pink N/A N/A Necrotic Amount: Medium (34-66%) N/A N/A Necrotic Tissue: Eschar N/A N/A Exposed Structures: Fat Layer (Subcutaneous Tissue): N/A N/A Yes Fascia: No Tendon: No Muscle: No Joint: No Bone: No Epithelialization: Medium (34-66%) N/A N/A Treatment Notes Electronic Signature(s) Signed: 11/04/2020 4:55:48 PM By: Georges Mouse, Minus Breeding RN Entered By: Georges Mouse, Minus Breeding on 11/04/2020 11:07:05 Henry Hill ([id number]) -------------------------------------------------------------------------------- Multi-Disciplinary Care Plan Details Patient Name: Henry Hill, BRUINS. Date of Service: 11/04/2020 10:30 AM Medical Record Number: [id number] Patient Account Number: [id number] Date of Birth/Sex: 1960/07/06 (60 y.o. M) Treating RN: Dolan Amen Primary Care Jermane Brayboy: Vernie Murders Other Clinician: Referring Alese Furniss: Vernie Murders Treating Arshad Oberholzer/Extender: Skipper Cliche in Treatment: 7 Active Inactive Wound/Skin Impairment Nursing Diagnoses: Impaired tissue integrity Knowledge deficit related to ulceration/compromised skin integrity Goals: Patient/caregiver will verbalize understanding of skin care regimen Date Initiated: 09/15/2020 Date Inactivated: 09/22/2020 Target Resolution Date: 10/17/2020 Goal Status: Met Ulcer/skin breakdown will have a volume  reduction of 30% by week 4 Date Initiated: 09/15/2020 Target Resolution Date: 10/13/2020 Goal Status: Active Ulcer/skin breakdown will have a volume reduction of 50% by week 8 Date Initiated: 09/15/2020 Target Resolution Date: 11/10/2020 Goal Status: Active Interventions: Assess patient/caregiver ability to obtain necessary supplies Assess patient/caregiver ability to perform ulcer/skin care regimen upon admission and as needed Assess ulceration(s) every visit Treatment Activities: Skin care regimen initiated : 09/15/2020 Notes: Electronic Signature(s) Signed: 11/04/2020 4:55:48 PM By: Georges Mouse, Minus Breeding RN Entered By: Georges Mouse, Minus Breeding on 11/04/2020 11:06:58 Henry Hill ([id number]) -------------------------------------------------------------------------------- Pain Assessment Details Patient Name: Henry Hill. Date of Service: 11/04/2020 10:30 AM Medical Record Number: [id number] Patient Account Number: [id number] Date of Birth/Sex: 08/06/1960 (60 y.o. M) Treating RN: Donnamarie Poag Primary Care Ireland Chagnon: Vernie Murders Other Clinician: Referring Bernhard Koskinen: Vernie Murders Treating Braxten Memmer/Extender: Skipper Cliche in Treatment: 7 Active Problems Location of Pain Severity and Description of Pain Patient Has Paino No Site Locations Rate the pain. Current Pain Level: 0 Pain Management and Medication Current Pain Management: Electronic Signature(s) Signed: 11/04/2020 1:52:30 PM By: Donnamarie Poag Entered By: Donnamarie Poag on 11/04/2020 10:44:32 Henry Hill ([id number]) -------------------------------------------------------------------------------- Patient/Caregiver Education Details Patient Name: Henry Hill Date of Service: 11/04/2020 10:30 AM Medical Record Number: [id number] Patient Account Number: [id number] Date of Birth/Gender: 03-17-1961 (60 y.o. M) Treating RN: Dolan Amen Primary Care Physician: Vernie Murders Other Clinician: Referring Physician:  Vernie Murders Treating Physician/Extender: Skipper Cliche in Treatment: 7 Education Assessment Education Provided To: Patient Education Topics Provided Wound/Skin Impairment: Methods: Explain/Verbal Responses: State content correctly Electronic Signature(s) Signed: 11/04/2020 4:55:48 PM By: Georges Mouse, Minus Breeding RN Entered By: Georges Mouse, Minus Breeding on 11/04/2020 11:09:46 Henry Hill ([id number]) -------------------------------------------------------------------------------- Wound Assessment Details Patient Name: Henry Hill, SHRUM. Date of Service: 11/04/2020 10:30 AM Medical Record Number: [id number] Patient Account Number: [id number] Date of Birth/Sex: 08-13-1960 (60 y.o. M) Treating RN: Donnamarie Poag Primary Care Tannah Dreyfuss: Vernie Murders Other Clinician: Referring Camauri Fleece: Vernie Murders Treating Joshiah Traynham/Extender: Skipper Cliche in Treatment: 7 Wound Status Wound Number: 1 Primary Etiology: Venous Leg Ulcer Wound Location: Left, Medial Lower Leg Wound Status: Open Wounding Event: Gradually Appeared Comorbid History: Arrhythmia, Hypertension Date Acquired: 08/09/2019 Weeks Of Treatment: 7 Clustered Wound: No Photos Wound Measurements Length: (cm) 0.2 Width: (cm) 0.2 Depth: (cm) 0.1 Area: (cm) 0.031 Volume: (cm) 0.003 % Reduction in Area: 99% % Reduction in Volume: 99% Epithelialization: Medium (34-66%) Tunneling: No Undermining: No Wound Description Classification: Full Thickness Without Exposed  Support Structu Exudate Amount: Medium Exudate Type: Serosanguineous Exudate Color: red, brown res Foul Odor After Cleansing: No Slough/Fibrino Yes Wound Bed Granulation Amount: Small (1-33%) Exposed Structure Granulation Quality: Pink Fascia Exposed: No Necrotic Amount: Medium (34-66%) Fat Layer (Subcutaneous Tissue) Exposed: Yes Necrotic Quality: Eschar Tendon Exposed: No Muscle Exposed: No Joint Exposed: No Bone Exposed: No Treatment  Notes Wound #1 (Lower Leg) Wound Laterality: Left, Medial Cleanser Soap and Water Discharge Instruction: Gently cleanse wound with antibacterial soap, rinse and pat dry prior to dressing wounds Peri-Wound Care Henry Hill, Henry Hill ([id number]) Topical Primary Dressing Hydrofera Blue Ready Transfer Foam, 2.5x2.5 (in/in) Discharge Instruction: Apply Hydrofera Blue Ready to wound bed as directed Secondary Dressing Mepilex Border Flex, 4x4 (in/in) Discharge Instruction: Apply to wound as directed. Do not cut. Secured With Compression Wrap Compression Stockings Add-Ons Electronic Signature(s) Signed: 11/04/2020 1:52:30 PM By: Donnamarie Poag Signed: 11/04/2020 4:55:48 PM By: Georges Mouse, Minus Breeding RN Entered By: Georges Mouse, Minus Breeding on 11/04/2020 11:12:10 Henry Hill ([id number]) -------------------------------------------------------------------------------- Vitals Details Patient Name: Henry Hill, Henry Hill. Date of Service: 11/04/2020 10:30 AM Medical Record Number: [id number] Patient Account Number: [id number] Date of Birth/Sex: [birth year] (60 y.o. M) Treating RN: Donnamarie Poag Primary Care Nzinga Ferran: Vernie Murders Other Clinician: Referring Montoya Brandel: Vernie Murders Treating Delontae Lamm/Extender: Skipper Cliche in Treatment: 7 Vital Signs Time Taken: 10:40 Temperature (F): 98.2 Height (in): 72 Pulse (bpm): 68 Weight (lbs): 170 Respiratory Rate (breaths/min): 16 Body Mass Index (BMI): 23.1 Blood Pressure (mmHg): 150/82 Reference Range: 80 - 120 mg / dl Electronic Signature(s) Signed: 11/04/2020 1:52:30 PM By: Donnamarie Poag Entered ByDonnamarie Poag on 11/04/2020 10:44:22

## 2020-11-04 NOTE — Progress Notes (Addendum)
KURK, CORNIEL ([id number]) Visit Report for 11/04/2020 Chief Complaint Document Details Patient Name: Henry Hill, Henry Hill. Date of Service: 11/04/2020 10:30 AM Medical Record Number: [id number] Patient Account Number: [id number] Date of Birth/Sex: March 28, 1961 (60 y.o. M) Treating RN: Dolan Amen Primary Care Provider: Vernie Murders Other Clinician: Referring Provider: Vernie Murders Treating Provider/Extender: Skipper Cliche in Treatment: 7 Information Obtained from: Patient Chief Complaint Left LE Ulcer Electronic Signature(s) Signed: 11/04/2020 11:02:07 AM By: Worthy Keeler PA-C Entered By: Worthy Keeler on 11/04/2020 11:02:07 Henry Hill ([id number]) -------------------------------------------------------------------------------- HPI Details Patient Name: Henry Hill Date of Service: 11/04/2020 10:30 AM Medical Record Number: [id number] Patient Account Number: [id number] Date of Birth/Sex: 1960-05-30 (59 y.o. M) Treating RN: Dolan Amen Primary Care Provider: Vernie Murders Other Clinician: Referring Provider: Vernie Murders Treating Provider/Extender: Skipper Cliche in Treatment: 7 History of Present Illness HPI Description: 09/15/2020 upon evaluation today patient appears to be doing somewhat poorly in regard to the wound that he tells me has been dealing with for about a year. Has been working with his primary care provider to try to help get this wound healed. He comes in today as he states that they had "failed" and getting this to completely close. Fortunately there does not appear to be any signs of active infection he did have a lot of eschar and dried drainage on the surface of the wound area. With that being said we were able to carefully remove this today I did not even have to perform debridement. This appears to be doing quite well once removed. He does have a history of chronic venous insufficiency, peripheral vascular disease, atrial  fibrillation, long-term use of anticoagulant therapy which has recently been started with Eliquis due to the fact that he was diagnosed with A. fib when he was in the hospital due to a bowel obstruction. He also has hypertension. 09/22/2020 upon evaluation today patient appears to be doing well with regard to his wound. He has been tolerating the dressing changes without complication. Fortunately there is no signs of active infection at this time. No fevers, chills, nausea, vomiting, or diarrhea. 5/23; patient with a wound on the left medial lower extremity. History of significant chronic venous insufficiency apparently has had procedures done by Dr. Lucky Cowboy he also had an ABI in this clinic of 0.59 he has limiting claudication although I could not quantify this. He is going for noninvasive arterial studies but this is being delayed as he apparently is having a cardiac stress test related to recent atrial fibrillation. He is using collagen 10/07/2020 upon evaluation today patient appears to be doing well in regard to his ankle ulcer. In fact this appears to be doing significantly better and is much smaller. He is going require some sharp debridement but again this is really not significantly problematic as I believe he is for the most part completely healed underneath the majority the area that I want to remove some of the necrotic debris. This seems to be more drainage and dry skin than anything. 10/21/2020 upon evaluation today patient appears to be doing well with regard to his wound. He has been tolerating the/40 dressing changes without complication. Fortunately there is no signs of active infection at this time. 11/04/2020 upon evaluation today patient's wound actually showed signs of improvement. I am actually very pleased with where things stand. There was some dried drainage when I remove this most of everything underneath was completely healed. There is just a very small opening it  still very  painful for him. He did have arterial studies that showed that he had ABIs bilaterally of 0.67. On the right he had an TBI of 0.46 and on the left a TBI of 0.40. There is moderate peripheral vascular disease noted as such. He does have an appointment with the cardiologist tomorrow actually. They will be discussing these results and what to do going forward. Electronic Signature(s) Signed: 11/04/2020 11:30:55 AM By: Worthy Keeler PA-C Entered By: Worthy Keeler on 11/04/2020 11:30:55 Henry Hill ([id number]) -------------------------------------------------------------------------------- Physical Exam Details Patient Name: Henry Hill, Henry Hill. Date of Service: 11/04/2020 10:30 AM Medical Record Number: [id number] Patient Account Number: [id number] Date of Birth/Sex: 1960/05/24 (60 y.o. M) Treating RN: Dolan Amen Primary Care Provider: Vernie Murders Other Clinician: Referring Provider: Vernie Murders Treating Provider/Extender: Skipper Cliche in Treatment: 7 Constitutional Well-nourished and well-hydrated in no acute distress. Respiratory normal breathing without difficulty. Psychiatric this patient is able to make decisions and demonstrates good insight into disease process. Alert and Oriented x 3. pleasant and cooperative. Notes Upon inspection patient's wound again showed signs of some good epithelization there is just a very small opening that even seems to be remaining and in general I am extremely pleased with where things stand today. I do not see any signs of active infection which is great news. Electronic Signature(s) Signed: 11/04/2020 11:31:10 AM By: Worthy Keeler PA-C Entered By: Worthy Keeler on 11/04/2020 11:31:09 Henry Hill ([id number]) -------------------------------------------------------------------------------- Physician Orders Details Patient Name: Henry Hill, Henry Hill. Date of Service: 11/04/2020 10:30 AM Medical Record Number: [id number] Patient  Account Number: [id number] Date of Birth/Sex: 01-Jul-1960 (60 y.o. M) Treating RN: Dolan Amen Primary Care Provider: Vernie Murders Other Clinician: Referring Provider: Vernie Murders Treating Provider/Extender: Skipper Cliche in Treatment: 7 Verbal / Phone Orders: No Diagnosis Coding ICD-10 Coding Code Description I87.2 Venous insufficiency (chronic) (peripheral) L97.828 Non-pressure chronic ulcer of other part of left lower leg with other specified severity I48.0 Paroxysmal atrial fibrillation Z79.01 Long term (current) use of anticoagulants I10 Essential (primary) hypertension Follow-up Appointments o Return Appointment in 2 weeks. Bathing/ Shower/ Hygiene o May shower; gently cleanse wound with antibacterial soap, rinse and pat dry prior to dressing wounds Wound Treatment Wound #1 - Lower Leg Wound Laterality: Left, Medial Cleanser: Soap and Water 3 x Per Week/30 Days Discharge Instructions: Gently cleanse wound with antibacterial soap, rinse and pat dry prior to dressing wounds Primary Dressing: Hydrofera Blue Ready Transfer Foam, 2.5x2.5 (in/in) 3 x Per Week/30 Days Discharge Instructions: Apply Hydrofera Blue Ready to wound bed as directed Secondary Dressing: Mepilex Border Flex, 4x4 (in/in) (DME) (Generic) 3 x Per Week/30 Days Discharge Instructions: Apply to wound as directed. Do not cut. Electronic Signature(s) Signed: 11/04/2020 4:55:48 PM By: Georges Mouse, Minus Breeding RN Signed: 11/04/2020 4:56:00 PM By: Worthy Keeler PA-C Entered By: Georges Mouse, Minus Breeding on 11/04/2020 11:12:45 Henry Hill ([id number]) -------------------------------------------------------------------------------- Problem List Details Patient Name: Henry Hill, Henry Hill. Date of Service: 11/04/2020 10:30 AM Medical Record Number: [id number] Patient Account Number: [id number] Date of Birth/Sex: 02-07-1961 (60 y.o. M) Treating RN: Dolan Amen Primary Care Provider: Vernie Murders Other  Clinician: Referring Provider: Vernie Murders Treating Provider/Extender: Skipper Cliche in Treatment: 7 Active Problems ICD-10 Encounter Code Description Active Date MDM Diagnosis I87.2 Venous insufficiency (chronic) (peripheral) 09/15/2020 No Yes L97.828 Non-pressure chronic ulcer of other part of left lower leg with other 09/15/2020 No Yes specified severity I48.0 Paroxysmal atrial fibrillation 09/15/2020 No Yes Z79.01 Long term (current) use  of anticoagulants 09/15/2020 No Yes I10 Essential (primary) hypertension 09/15/2020 No Yes Inactive Problems Resolved Problems Electronic Signature(s) Signed: 11/04/2020 11:02:00 AM By: Worthy Keeler PA-C Entered By: Worthy Keeler on 11/04/2020 11:02:00 Henry Hill ([id number]) -------------------------------------------------------------------------------- Progress Note Details Patient Name: Henry Hill, Henry C. Date of Service: 11/04/2020 10:30 AM Medical Record Number: [id number] Patient Account Number: [id number] Date of Birth/Sex: 06-30-1960 (60 y.o. M) Treating RN: Dolan Amen Primary Care Provider: Vernie Murders Other Clinician: Referring Provider: Vernie Murders Treating Provider/Extender: Skipper Cliche in Treatment: 7 Subjective Chief Complaint Information obtained from Patient Left LE Ulcer History of Present Illness (HPI) 09/15/2020 upon evaluation today patient appears to be doing somewhat poorly in regard to the wound that he tells me has been dealing with for about a year. Has been working with his primary care provider to try to help get this wound healed. He comes in today as he states that they had "failed" and getting this to completely close. Fortunately there does not appear to be any signs of active infection he did have a lot of eschar and dried drainage on the surface of the wound area. With that being said we were able to carefully remove this today I did not even have to perform debridement. This appears to  be doing quite well once removed. He does have a history of chronic venous insufficiency, peripheral vascular disease, atrial fibrillation, long-term use of anticoagulant therapy which has recently been started with Eliquis due to the fact that he was diagnosed with A. fib when he was in the hospital due to a bowel obstruction. He also has hypertension. 09/22/2020 upon evaluation today patient appears to be doing well with regard to his wound. He has been tolerating the dressing changes without complication. Fortunately there is no signs of active infection at this time. No fevers, chills, nausea, vomiting, or diarrhea. 5/23; patient with a wound on the left medial lower extremity. History of significant chronic venous insufficiency apparently has had procedures done by Dr. Lucky Cowboy he also had an ABI in this clinic of 0.59 he has limiting claudication although I could not quantify this. He is going for noninvasive arterial studies but this is being delayed as he apparently is having a cardiac stress test related to recent atrial fibrillation. He is using collagen 10/07/2020 upon evaluation today patient appears to be doing well in regard to his ankle ulcer. In fact this appears to be doing significantly better and is much smaller. He is going require some sharp debridement but again this is really not significantly problematic as I believe he is for the most part completely healed underneath the majority the area that I want to remove some of the necrotic debris. This seems to be more drainage and dry skin than anything. 10/21/2020 upon evaluation today patient appears to be doing well with regard to his wound. He has been tolerating the/40 dressing changes without complication. Fortunately there is no signs of active infection at this time. 11/04/2020 upon evaluation today patient's wound actually showed signs of improvement. I am actually very pleased with where things stand. There was some dried drainage  when I remove this most of everything underneath was completely healed. There is just a very small opening it still very painful for him. He did have arterial studies that showed that he had ABIs bilaterally of 0.67. On the right he had an TBI of 0.46 and on the left a TBI of 0.40. There is moderate peripheral vascular disease noted  as such. He does have an appointment with the cardiologist tomorrow actually. They will be discussing these results and what to do going forward. Objective Constitutional Well-nourished and well-hydrated in no acute distress. Vitals Time Taken: 10:40 AM, Height: 72 in, Weight: 170 lbs, BMI: 23.1, Temperature: 98.2 F, Pulse: 68 bpm, Respiratory Rate: 16 breaths/min, Blood Pressure: 150/82 mmHg. Respiratory normal breathing without difficulty. Psychiatric this patient is able to make decisions and demonstrates good insight into disease process. Alert and Oriented x 3. pleasant and cooperative. General Notes: Upon inspection patient's wound again showed signs of some good epithelization there is just a very small opening that even seems to be remaining and in general I am extremely pleased with where things stand today. I do not see any signs of active infection which is great news. Integumentary (Hair, Skin) Wound #1 status is Open. Original cause of wound was Gradually Appeared. The date acquired was: 08/09/2019. The wound has been in treatment Henry Hill, Henry C. ([id number]) 7 weeks. The wound is located on the Left,Medial Lower Leg. The wound measures 0.2cm length x 0.2cm width x 0.1cm depth; 0.031cm^2 area and 0.003cm^3 volume. There is Fat Layer (Subcutaneous Tissue) exposed. There is no tunneling or undermining noted. There is a medium amount of serosanguineous drainage noted. There is small (1-33%) pink granulation within the wound bed. There is a medium (34-66%) amount of necrotic tissue within the wound bed including Eschar. Assessment Active  Problems ICD-10 Venous insufficiency (chronic) (peripheral) Non-pressure chronic ulcer of other part of left lower leg with other specified severity Paroxysmal atrial fibrillation Long term (current) use of anticoagulants Essential (primary) hypertension Plan Follow-up Appointments: Return Appointment in 2 weeks. Bathing/ Shower/ Hygiene: May shower; gently cleanse wound with antibacterial soap, rinse and pat dry prior to dressing wounds WOUND #1: - Lower Leg Wound Laterality: Left, Medial Cleanser: Soap and Water 3 x Per Week/30 Days Discharge Instructions: Gently cleanse wound with antibacterial soap, rinse and pat dry prior to dressing wounds Primary Dressing: Hydrofera Blue Ready Transfer Foam, 2.5x2.5 (in/in) 3 x Per Week/30 Days Discharge Instructions: Apply Hydrofera Blue Ready to wound bed as directed Secondary Dressing: Mepilex Border Flex, 4x4 (in/in) (DME) (Generic) 3 x Per Week/30 Days Discharge Instructions: Apply to wound as directed. Do not cut. 1. Would recommend that we continue with wound care measures as before and the patient is in agreement with plan. That includes the use of the [facility] which I think has done extremely well for him. 2. Also can recommend that we use the border foam dressing to cover. We just given the left over [facility] therefore we can order the border foam. 3. I am also can recommend that the patient continue to elevate his legs much as possible I think this will definitely help with swelling if any occurs although he is not terribly swollen at this point. We will see patient back for reevaluation in 1 week here in the clinic. If anything worsens or changes patient will contact our office for additional recommendations. Electronic Signature(s) Signed: 11/04/2020 11:31:42 AM By: Worthy Keeler PA-C Entered By: Worthy Keeler on 11/04/2020 11:31:42 Henry Hill  ([id number]) -------------------------------------------------------------------------------- SuperBill Details Patient Name: Henry Hill, Henry Hill. Date of Service: 11/04/2020 Medical Record Number: [id number] Patient Account Number: [id number] Date of Birth/Sex: July 31, 1960 (60 y.o. M) Treating RN: Dolan Amen Primary Care Provider: Vernie Murders Other Clinician: Referring Provider: Vernie Murders Treating Provider/Extender: Skipper Cliche in Treatment: 7 Diagnosis Coding ICD-10 Codes Code Description I87.2 Venous  insufficiency (chronic) (peripheral) L97.828 Non-pressure chronic ulcer of other part of left lower leg with other specified severity I48.0 Paroxysmal atrial fibrillation Z79.01 Long term (current) use of anticoagulants I10 Essential (primary) hypertension Facility Procedures CPT4 Code: 97673419 Description: 37902 - WOUND CARE VISIT-LEV 3 EST PT Modifier: Quantity: 1 Physician Procedures CPT4 Code: [id number] Description: 29924 - WC PHYS LEVEL 4 - EST PT Modifier: Quantity: 1 CPT4 Code: Description: ICD-10 Diagnosis Description I87.2 Venous insufficiency (chronic) (peripheral) L97.828 Non-pressure chronic ulcer of other part of left lower leg with other spe I48.0 Paroxysmal atrial fibrillation Z79.01 Long term (current) use of  anticoagulants Modifier: cified severity Quantity: Electronic Signature(s) Signed: 11/04/2020 11:32:13 AM By: Worthy Keeler PA-C Entered By: Worthy Keeler on 11/04/2020 11:32:13

## 2020-11-05 ENCOUNTER — Encounter: Payer: Self-pay | Admitting: Cardiovascular Disease

## 2020-11-05 ENCOUNTER — Ambulatory Visit (INDEPENDENT_AMBULATORY_CARE_PROVIDER_SITE_OTHER): Payer: 59 | Admitting: Cardiovascular Disease

## 2020-11-05 ENCOUNTER — Other Ambulatory Visit: Payer: Self-pay

## 2020-11-05 VITALS — BP 142/86 | HR 77 | Ht 72.0 in | Wt 180.2 lb

## 2020-11-05 DIAGNOSIS — E782 Mixed hyperlipidemia: Secondary | ICD-10-CM

## 2020-11-05 DIAGNOSIS — I739 Peripheral vascular disease, unspecified: Secondary | ICD-10-CM | POA: Diagnosis not present

## 2020-11-05 DIAGNOSIS — I10 Essential (primary) hypertension: Secondary | ICD-10-CM | POA: Diagnosis not present

## 2020-11-05 DIAGNOSIS — Z720 Tobacco use: Secondary | ICD-10-CM | POA: Diagnosis not present

## 2020-11-05 DIAGNOSIS — I480 Paroxysmal atrial fibrillation: Secondary | ICD-10-CM

## 2020-11-05 DIAGNOSIS — I700 Atherosclerosis of aorta: Secondary | ICD-10-CM

## 2020-11-05 NOTE — Progress Notes (Signed)
Cardiology Office Note  Date:  11/05/2020   ID:  ZEBEDIAH BEEZLEY, DOB [birth year], MRN [id number]  PCP:  Margo Common, PA-C   Chief Complaint  Patient presents with   Follow up [place] arterial. "Doing well."  Medications reviewed by the patient verbally.     HPI:  60 year old male with a history of  hypertension, tobacco abuse, alcohol use, probable sleep apnea, and varicose veins,   hospitalization for abdominal pain with finding of bowel obstruction/cecal volvulus requiring exploratory laparotomy and right colectomy on April 23,  with subsequent development of atrial fibrillation. Who presents for routine follow-up in the office for his atrial fibrillation  Last seen by one of our providers Sep 25, 2020  Review of prior events as below Following surgery April 2022,  developed atrial fibrillation with rates into the 140s associate with inferolateral ST depression.   Started on IV diltiazem , transitioned to oral diltiazem and metoprolol.   Normal sinus rhythm September 05, 2020  Echocardiogram showed low normal EF at 50-55% without regional wall motion abnormalities and grade 1 diastolic dysfunction.   placed on Eliquis 5 mg twice daily.    Continues to smoke 1/2 pack/day Bilateral calf claudication has been persistent, nonhealing ulcer left lower extremity followed by wound clinic  Reports that he needs to have repeat colonoscopy November 24, 2020  Recent testing reviewed with him on today's visit Stress test Brantley Fling showing no significant ischemia  Echocardiogram  ejection fraction 50 to 55%  ABIs done, moderately depressed bilaterally  Extremity arterial duplex Left lower extremity 75-99% stenosis noted in the common femoral artery. Heavy irregular  calcified plaque in the CFA and proximal PFA and SFA, with shadowing. Flow  disturbance in distal SFA difficult to characterize, with multiple  collaterals in the distal SFA and   popliteal region. Cannot exclude short segment popliteal artery occlusion.   Right lower extremity 50-74% stenosis noted in the common femoral artery. Heavy irregular  calcified plaque in the CFA and proximal PFA and SFA, with shadowing.  Cannot exclude a tighter stenosis in the CFA, and possible short segment  occlusion in the distal  SFA/popliteal.     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  PSH:    Past Surgical History:  Procedure Laterality Date   BACK SURGERY  2003   HEMORRHOID SURGERY N/A 11/13/2014   Procedure: LEFT LATERAL HEMORRHOIDECTOMY  ;  Surgeon: [name], MD;  Location: [facility] ORS;  Service: General;  Laterality: N/A;   LAPAROTOMY Right 08/30/2020   Procedure: EXPLORATORY LAPAROTOMY WITH RIGHT COLOCTOMY;  Surgeon: [name], MD;  Location: [facility] ORS;  Service: General;  Laterality: Right;    Current Outpatient Medications  Medication Sig Dispense Refill   metoprolol tartrate (LOPRESSOR) 50 MG tablet Take 1.5 tablets (75 mg total) by mouth 2 (two) times daily. 90 tablet 0   Multiple Vitamins-Minerals (MULTIVITAMIN ADULT PO) Take 1 tablet by mouth daily.     rivaroxaban (XARELTO) 20 MG TABS tablet Take 1 tablet (20 mg total) by mouth daily with supper. 30 tablet 11   sildenafil (VIAGRA) 50 MG tablet TAKE 1 TABLET BY MOUTH DAILY AS NEEDED FOR ERECTILE DYSFUNCTION 6 tablet 3   No current facility-administered medications for this visit.     Allergies:   Patient has no known  allergies.   Social History:  The patient  reports that he has been smoking cigarettes. He has been smoking an average of 0.50 packs per day. He has never used smokeless tobacco. He reports current alcohol use of about 20.0 standard drinks of alcohol per week. He reports that he does not use drugs.   Family History:    family history includes Diabetes in his father; Heart attack in his mother; Hypertension in his father.    Review of Systems: Review of Systems  Constitutional: Negative.   HENT: Negative.    Respiratory: Negative.    Cardiovascular: Negative.   Gastrointestinal: Negative.   Musculoskeletal:  Positive for myalgias.       Leg pain on ambulation  Neurological: Negative.   Psychiatric/Behavioral: Negative.    All other systems reviewed and are negative.   PHYSICAL EXAM: VS:  BP (!) 142/86 (BP Location: Left Arm, Patient Position: Sitting, Cuff Size: Normal)   Pulse 77   Ht 6' (1.829 m)   Wt 180 lb 4 oz (81.8 kg)   SpO2 98%   BMI 24.45 kg/m  , BMI Body mass index is 24.45 kg/m. GEN: Well nourished, well developed, in no acute distress HEENT: normal Neck: no JVD, carotid bruits, or masses Cardiac: RRR; no murmurs, rubs, or gallops,no edema  Respiratory:  clear to auscultation bilaterally, normal work of breathing GI: soft, nontender, nondistended, + BS MS: no deformity or atrophy Bandage left medial aspect of his ankle Skin: warm and dry, no rash Neuro:  Strength and sensation are intact Psych: euthymic mood, full affect   Recent Labs: 08/30/2020: TSH 0.864 09/01/2020: ALT 28 09/03/2020: B Natriuretic Peptide 436.6 09/25/2020: BUN 11; Creatinine, Ser 0.63; Hemoglobin 12.1; Magnesium 2.0; Platelets 344; Potassium 4.2; Sodium 137    Lipid Panel Lab Results  Component Value Date   CHOL 151 07/03/2019   HDL 100 07/03/2019   LDLCALC 42 07/03/2019   TRIG 48 09/01/2020      Wt Readings from Last 3 Encounters:  11/05/20 180 lb 4 oz (81.8 kg)  10/28/20 179 lb (81.2 kg)  10/08/20 170 lb (77.1 kg)      ASSESSMENT AND PLAN:  Problem List Items Addressed This Visit       Cardiology Problems   Aortic atherosclerosis ([place])   Essential (primary) hypertension   Other Visit Diagnoses     PAF (paroxysmal atrial fibrillation) ([place])    -  Primary   PAD (peripheral artery  disease) ([place])       Relevant Orders   Ambulatory referral to Cardiology   Tobacco abuse       Mixed hyperlipidemia          Paroxysmal atrial fibrillation Declining EKG today, clinically normal sinus rhythm on auscultation Recommend he continue his anticoagulation/Xarelto Continue metoprolol for rate control He will stop Xarelto 2 days prior to colonoscopy July 18  PAD Will refer to vascular , Dr. [name] Nonhealing left lower extremity wound, arterial duplex ultrasound is impressive with severe disease He is having claudication symptoms  obstruction/cecal volvulus Completed surgery, scheduled for repeat colonoscopy July 18  Smoker We have encouraged him to continue to work on weaning his cigarettes and smoking cessation. He will continue to work on this and does not want any assistance with chantix.    Extensive review of imaging studies, plan for vascular evaluation discussed with him Timing with colonoscopy in several weeks time  Total encounter time more than 35 minutes  Greater than 50% was spent in  counseling and coordination of care with the patient    Signed, Esmond Plants, M.D., Ph.D. [place], [place]

## 2020-11-05 NOTE — Patient Instructions (Signed)
Appt with Dr. [name] for leg blockages (ASAP-next few weeks)   Medication Instructions:  No changes  If you need a refill on your cardiac medications before your next appointment, please call your pharmacy.    Lab work: No new labs needed   If you have labs (blood work) drawn today and your tests are completely normal, you will receive your results only by: [place] (if you have MyChart) OR A paper copy in the mail If you have any lab test that is abnormal or we need to change your treatment, we will call you to review the results.   Testing/Procedures: No new testing needed   Follow-Up: At [facility], you and your health needs are our priority.  As part of our continuing mission to provide you with exceptional heart care, we have created designated Provider Care Teams.  These Care Teams include your primary Cardiologist (physician) and Advanced Practice Providers (APPs -  Physician Assistants and Nurse Practitioners) who all work together to provide you with the care you need, when you need it.  You will need a follow up appointment with Dr. [name] to discuss PAD  Providers on your designated Care Team:   Murray Hodgkins, NP Christell Faith, PA-C Marrianne Mood, PA-C  Any Other Special Instructions Will Be Listed Below (If Applicable).  COVID-19 Vaccine Information can be found at: ShippingScam.co.uk For questions related to vaccine distribution or appointments, please email vaccine@[email or web address] .com or call [phone].

## 2020-11-12 NOTE — Progress Notes (Signed)
Covering preop today. This is an old msg from 10/28/20. Patient has since seen MD - preop addressed in June phone note and appt so will remove from box.

## 2020-11-13 ENCOUNTER — Other Ambulatory Visit: Payer: Self-pay

## 2020-11-13 ENCOUNTER — Encounter: Payer: Self-pay | Admitting: Cardiovascular Disease

## 2020-11-13 ENCOUNTER — Ambulatory Visit (INDEPENDENT_AMBULATORY_CARE_PROVIDER_SITE_OTHER): Payer: 59 | Admitting: Cardiovascular Disease

## 2020-11-13 VITALS — BP 150/82 | HR 75 | Ht 72.0 in | Wt 180.0 lb

## 2020-11-13 DIAGNOSIS — I739 Peripheral vascular disease, unspecified: Secondary | ICD-10-CM | POA: Diagnosis not present

## 2020-11-13 DIAGNOSIS — Z720 Tobacco use: Secondary | ICD-10-CM | POA: Diagnosis not present

## 2020-11-13 DIAGNOSIS — I480 Paroxysmal atrial fibrillation: Secondary | ICD-10-CM | POA: Diagnosis not present

## 2020-11-13 MED ORDER — ATORVASTATIN CALCIUM 10 MG PO TABS: 10.0000 mg | ORAL_TABLET | Freq: Every day | ORAL | 5 refills | Status: DC

## 2020-11-13 NOTE — Patient Instructions (Addendum)
Medication Instructions:  Your physician has recommended you make the following change in your medication:   Atorvastatin 10 mg daily. An Rx has been sent to your pharmacy.  See pre-procedure instruction for further medication instructions.  *If you need a refill on your cardiac medications before your next appointment, please call your pharmacy*   Lab Work: Your physician recommends that you return for lab work (bmp, cbc) the week of 12/01/20.  Please have your labs drawn at the [facility] medical mall. You do not need an appt. Lab hours are Mon-Fri 7:30am-6pm.  If you have labs (blood work) drawn today and your tests are completely normal, you will receive your results only by: [place] (if you have MyChart) OR A paper copy in the mail If you have any lab test that is abnormal or we need to change your treatment, we will call you to review the results.   Testing/Procedures: Dr. [name] has recommended that you have an abdominal aortogram. Please see instructions below.    Follow-Up: At [facility], you and your health needs are our priority.  As part of our continuing mission to provide you with exceptional heart care, we have created designated Provider Care Teams.  These Care Teams include your primary Cardiologist (physician) and Advanced Practice Providers (APPs -  Physician Assistants and Nurse Practitioners) who all work together to provide you with the care you need, when you need it.  We recommend signing up for the patient portal called "MyChart".  Sign up information is provided on this After Visit Summary.  MyChart is used to connect with patients for Virtual Visits (Telemedicine).  Patients are able to view lab/test results, encounter notes, upcoming appointments, etc.  Non-urgent messages can be sent to your provider as well.   To learn more about what you can do with MyChart, go to [email or web address].    Your next appointment:   6 week(s)  The format for your  next appointment:   In Person  Provider:   You may see [name], MD or one of the following Advanced Practice Providers on your designated Care Team:   Murray Hodgkins, NP Christell Faith, PA-C Marrianne Mood, PA-C [place] Kathlen Mody, [place] Laurann Montana, NP   Other Instructions      [place], [place] [address] Dept: [place]: [place]  11/13/2020  You are scheduled for a Peripheral Angiogram on Wednesday, August 3 with Dr. [name].  1. Please arrive at the [facility] (Main Entrance A) at [facility]: [address] [place], [place] [address] at 8:00 AM (This time is two hours before your procedure to ensure your preparation). Free valet parking service is available.   Special note: Every effort is made to have your procedure done on time. Please understand that emergencies sometimes delay scheduled procedures.  2. Diet: Do not eat solid foods after midnight.  The patient may have clear liquids until 5am upon the day of the procedure.  3. Labs: You will need to have blood drawn on Monday, July 25 at [facility], Go to 1st desk on your right to register.  Address: [address], [place] [address]  Open: 8am - 5pm  Phone: [phone]. You do not need to be fasting.  4. Medication instructions in preparation for your procedure:   Contrast Allergy: No   Stop taking Xarelto (Rivaroxaban) on Sunday, July  31.  On Monday August 1 start Aspirin 81 mg daily until further instructed.      On the morning of your procedure, take your Aspirin and any morning medicines NOT listed above.  You may use sips of water.  5. Plan for one night stay--bring personal belongings. 6. Bring a current list of your medications and current insurance cards. 7. You MUST have a responsible person to drive you home. 8.  Someone MUST be with you the first 24 hours after you arrive home or your discharge will be delayed. 9. Please wear clothes that are easy to get on and off and wear slip-on shoes.  Thank you for allowing [place] to care for you!   -- [place] [facility]   Your physician discussed the hazards of tobacco use. Tobacco use cessation is recommended and techniques and options to help you quit were discussed.  Steps to Quit Smoking Smoking tobacco is the leading cause of preventable death. It can affect almost every organ in the body. Smoking puts you and people around you at risk for many serious, long-lasting (chronic) diseases. Quitting smoking can be hard, but it is one of the best things thatyou can do for your health. It is never too late to quit. How do I get ready to quit? When you decide to quit smoking, make a plan to help you succeed. Before you quit: Pick a date to quit. Set a date within the next 2 weeks to give you time to prepare. Write down the reasons why you are quitting. Keep this list in places where you will see it often. Tell your family, friends, and co-workers that you are quitting. Their support is important. Talk with your doctor about the choices that may help you quit. Find out if your health insurance will pay for these treatments. Know the people, places, things, and activities that make you want to smoke (triggers). Avoid them. What first steps can I take to quit smoking? Throw away all cigarettes at home, at work, and in your car. Throw away the things that you use when you smoke, such as ashtrays and lighters. Clean your car. Make sure to empty the ashtray. Clean your home, including curtains and carpets. What can I do to help me quit smoking? Talk with your doctor about taking medicines and seeing a counselor at the same time. You are more likely to succeed when you do both. If you are pregnant or breastfeeding, talk with your doctor about  counseling or other ways to quit smoking. Do not take medicine to help you quit smoking unless your doctor tells you to do so. To quit smoking: Quit right away Quit smoking totally, instead of slowly cutting back on how much you smoke over a period of time. Go to counseling. You are more likely to quit if you go to counseling sessions regularly. Take medicine You may take medicines to help you quit. Some medicines need a prescription, and some you can buy over-the-counter. Some medicines may contain a drug called nicotine to replace the nicotine in cigarettes. Medicines may: Help you to stop having the desire to smoke (cravings). Help to stop the problems that come when you stop smoking (withdrawal symptoms). Your doctor may ask you to use: Nicotine patches, gum, or lozenges. Nicotine inhalers or sprays. Non-nicotine medicine that is taken by mouth. Find resources Find resources and other ways to help you quit smoking and remain smoke-free after you quit. These resources are most helpful  when you use them often. They include: Online chats with a Social worker. Phone quitlines. Printed Furniture conservator/restorer. Support groups or group counseling. Text messaging programs. Mobile phone apps. Use apps on your mobile phone or tablet that can help you stick to your quit plan. There are many free apps for mobile phones and tablets as well as websites. Examples include Quit Guide from the [employer] and smokefree.gov  What things can I do to make it easier to quit?  Talk to your family and friends. Ask them to support and encourage you. Call a phone quitline (1-800-QUIT-NOW), reach out to support groups, or work with a Social worker. Ask people who smoke to not smoke around you. Avoid places that make you want to smoke, such as: Bars. Parties. Smoke-break areas at work. Spend time with people who do not smoke. Lower the stress in your life. Stress can make you want to smoke. Try these things to help your  stress: Getting regular exercise. Doing deep-breathing exercises. Doing yoga. Meditating. Doing a body scan. To do this, close your eyes, focus on one area of your body at a time from head to toe. Notice which parts of your body are tense. Try to relax the muscles in those areas. How will I feel when I quit smoking? Day 1 to 3 weeks Within the first 24 hours, you may start to have some problems that come from quitting tobacco. These problems are very bad 2-3 days after you quit, but they do not often last for more than 2-3 weeks. You may get these symptoms: Mood swings. Feeling restless, nervous, angry, or annoyed. Trouble concentrating. Dizziness. Strong desire for high-sugar foods and nicotine. Weight gain. Trouble pooping (constipation). Feeling like you may vomit (nausea). Coughing or a sore throat. Changes in how the medicines that you take for other issues work in your body. Depression. Trouble sleeping (insomnia). Week 3 and afterward After the first 2-3 weeks of quitting, you may start to notice more positive results, such as: Better sense of smell and taste. Less coughing and sore throat. Slower heart rate. Lower blood pressure. Clearer skin. Better breathing. Fewer sick days. Quitting smoking can be hard. Do not give up if you fail the first time. Some people need to try a few times before they succeed. Do your best to stick to your quit plan, and talk with yourdoctor if you have any questions or concerns. Summary Smoking tobacco is the leading cause of preventable death. Quitting smoking can be hard, but it is one of the best things that you can do for your health. When you decide to quit smoking, make a plan to help you succeed. Quit smoking right away, not slowly over a period of time. When you start quitting, seek help from your doctor, family, or friends. This information is not intended to replace advice given to you by your health care provider. Make sure you  discuss any questions you have with your healthcare provider. Document Revised: 01/19/2019 Document Reviewed: 07/15/2018 Elsevier Patient Education  [place].

## 2020-11-13 NOTE — H&P (View-Only) (Signed)
Cardiology Office Note   Date:  11/13/2020   ID:  JAHDEN SCHARA, DOB March 14, 1961, MRN [id number]  PCP:  Margo Common, PA-C  Cardiologist:  Dr. Rockey Situ  Chief Complaint  Patient presents with   New Patient (Initial Visit)    Referred by [facility] for PAD. Meds reviewed verbally with patient.       History of Present Illness: Henry Hill is a 60 y.o. male who was referred by Dr. Rockey Situ for evaluation and management of peripheral arterial disease.  He has known history of paroxysmal atrial fibrillation, essential hypertension, tobacco use, alcohol use, probable sleep apnea and varicose veins.  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  He had atrial fibrillation with RVR in that setting.  He converted sinus rhythm with metoprolol and diltiazem.  Echocardiogram showed an EF of 50 to 55%.  He was anticoagulated with Eliquis.  He had a right colectomy done with improvement.  He had subsequent Lexiscan Myoview last month which showed no clear evidence of ischemia although the stress test was suboptimal.  The patient reported bilateral thigh and calf claudication after walking about 1 block.  The symptoms have been present for about 2 to 3 years.  Most recently, he developed a large ulceration on the medial side of the left ankle.  He has been going to the wound clinic and this has improved significantly and almost healed completely.  Patient is very limited by his claudication.   He smokes half a pack per day and has been smoking for a long time.  He had recent lower extremity arterial Doppler which showed an ABI of 0.67 bilaterally.  Duplex showed significant calcified right common femoral artery stenosis with possible short occlusion in the right distal SFA.  On the left, there was severe stenosis in the distal common femoral artery with an occluded popliteal artery.   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 08/2020 s/p ex  lap & R colectomy.   Diastolic dysfunction    a 08/2020 Echo: EF 50-55%, no rwma, Gr1 DD, nl RV size/fxn, triv MR.   Essential hypertension    ETOH abuse    Persistent atrial fibrillation ([place])    a. Dx 08/2020 in setting of cecal volvulus s/p R colectomy.  CHA2DS@VASc  = 1.   Tobacco abuse    Varicose veins of both lower extremities     Past Surgical History:  Procedure Laterality Date   BACK SURGERY  2003   HEMORRHOID SURGERY N/A 11/13/2014   Procedure: LEFT LATERAL HEMORRHOIDECTOMY  ;  Surgeon: [name], MD;  Location: [facility] ORS;  Service: General;  Laterality: N/A;   LAPAROTOMY Right 08/30/2020   Procedure: EXPLORATORY LAPAROTOMY WITH RIGHT COLOCTOMY;  Surgeon: [name], MD;  Location: [facility] ORS;  Service: General;  Laterality: Right;     Current Outpatient Medications  Medication Sig Dispense Refill   metoprolol tartrate (LOPRESSOR) 50 MG tablet Take 1.5 tablets (75 mg total) by mouth 2 (two) times daily. 90 tablet 0   Multiple Vitamins-Minerals (MULTIVITAMIN ADULT PO) Take 1 tablet by mouth daily.     rivaroxaban (XARELTO) 20 MG TABS tablet Take 1 tablet (20 mg total) by mouth daily with supper. 30 tablet 11   sildenafil (VIAGRA) 50 MG tablet TAKE 1 TABLET BY MOUTH DAILY AS NEEDED FOR ERECTILE DYSFUNCTION 6 tablet 3   No current facility-administered medications for this visit.    Allergies:  Patient has no known allergies.    Social History:  The patient  reports that he has been smoking cigarettes. He has been smoking an average of 0.50 packs per day. He has never used smokeless tobacco. He reports current alcohol use of about 20.0 standard drinks of alcohol per week. He reports that he does not use drugs.   Family History:  The patient's family history includes Diabetes in his father; Heart attack in his mother; Hypertension in his father.    ROS:  Please see the history of present illness.   Otherwise, review of systems are positive for none.   All other systems are  reviewed and negative.    PHYSICAL EXAM: VS:  BP (!) 150/82 (BP Location: Left Arm, Patient Position: Sitting, Cuff Size: Normal)   Pulse 75   Ht 6' (1.829 m)   Wt 180 lb (81.6 kg)   SpO2 98%   BMI 24.41 kg/m  , BMI Body mass index is 24.41 kg/m. GEN: Well nourished, well developed, in no acute distress  HEENT: normal  Neck: no JVD, carotid bruits, or masses Cardiac: RRR; no murmurs, rubs, or gallops,no edema  Respiratory:  clear to auscultation bilaterally, normal work of breathing GI: soft, nontender, nondistended, + BS MS: no deformity or atrophy  Skin: warm and dry, no rash Neuro:  Strength and sensation are intact Psych: euthymic mood, full affect Vascular: Radial pulses normal bilaterally.  Femoral pulse is +1 bilaterally.  Distal pulses are not palpable.  A 1 cm ulceration is noted on the medial side of the left ankle which is superficial.   EKG:  EKG is not ordered today.    Recent Labs: 08/30/2020: TSH 0.864 09/01/2020: ALT 28 09/03/2020: B Natriuretic Peptide 436.6 09/25/2020: BUN 11; Creatinine, Ser 0.63; Hemoglobin 12.1; Magnesium 2.0; Platelets 344; Potassium 4.2; Sodium 137    Lipid Panel    Component Value Date/Time   CHOL 151 07/03/2019 0816   TRIG 48 09/01/2020 0619   HDL 100 07/03/2019 0816   CHOLHDL 1.5 07/03/2019 0816   LDLCALC 42 07/03/2019 0816      Wt Readings from Last 3 Encounters:  11/13/20 180 lb (81.6 kg)  11/05/20 180 lb 4 oz (81.8 kg)  10/28/20 179 lb (81.2 kg)       PAD Screen 11/13/2020  Previous PAD dx? No  Previous surgical procedure? Yes  Dates of procedures knee replacement  Pain with walking? Yes  Subsides with rest? No  Feet/toe relief with dangling? No  Painful, non-healing ulcers? Yes  Extremities discolored? Yes      ASSESSMENT AND PLAN:  1.  Peripheral arterial disease with severe bilateral leg claudication worse on the left than the right.  Slow healing ulceration on the medial aspect of the left ankle.  The  patient has evidence of severe peripheral arterial disease.  I discussed with him the natural history disease.  I recommend proceeding with abdominal aortogram with lower extremity runoff and possible endovascular intervention. Hold Xarelto 2 days before. Radial access should be considered given involvement of bilateral common femoral arteries. I suggested proceeding with the procedure sooner than later but the patient has to make arrangements and will not be available until early August.  2.  Paroxysmal atrial fibrillation: Seems to be maintaining in sinus rhythm.  He is on long-term anticoagulation with Xarelto with a CHA2DS2-VASc score of 2.  Hold Xarelto 2 days before the procedure.  3.  The patient does not have hyperlipidemia but given his extensive peripheral  arterial disease, I recommend treatment with atorvastatin 10 mg daily.  4.  Tobacco use: I discussed with him the importance of smoking cessation.    Disposition:   FU 2 weeks after angiogram.  Signed,  [name], MD  11/13/2020 3:17 PM    [place]

## 2020-11-13 NOTE — Progress Notes (Signed)
Cardiology Office Note   Date:  11/13/2020   ID:  Henry Hill, DOB [birth year], MRN [id number]  PCP:  Margo Common, PA-C  Cardiologist:  Dr. Rockey Situ  Chief Complaint  Patient presents with   New Patient (Initial Visit)    Referred by [facility] for PAD. Meds reviewed verbally with patient.       History of Present Illness: Henry Hill is a 60 y.o. male who was referred by Dr. Rockey Situ for evaluation and management of peripheral arterial disease.  He has known history of paroxysmal atrial fibrillation, essential hypertension, tobacco use, alcohol use, probable sleep apnea and varicose veins.  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  He had atrial fibrillation with RVR in that setting.  He converted sinus rhythm with metoprolol and diltiazem.  Echocardiogram showed an EF of 50 to 55%.  He was anticoagulated with Eliquis.  He had a right colectomy done with improvement.  He had subsequent Lexiscan Myoview last month which showed no clear evidence of ischemia although the stress test was suboptimal.  The patient reported bilateral thigh and calf claudication after walking about 1 block.  The symptoms have been present for about 2 to 3 years.  Most recently, he developed a large ulceration on the medial side of the left ankle.  He has been going to the wound clinic and this has improved significantly and almost healed completely.  Patient is very limited by his claudication.   He smokes half a pack per day and has been smoking for a long time.  He had recent lower extremity arterial Doppler which showed an ABI of 0.67 bilaterally.  Duplex showed significant calcified right common femoral artery stenosis with possible short occlusion in the right distal SFA.  On the left, there was severe stenosis in the distal common femoral artery with an occluded popliteal artery.   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 08/2020 s/p ex  lap & R colectomy.   Diastolic dysfunction    a 08/2020 Echo: EF 50-55%, no rwma, Gr1 DD, nl RV size/fxn, triv MR.   Essential hypertension    ETOH abuse    Persistent atrial fibrillation ([place])    a. Dx 08/2020 in setting of cecal volvulus s/p R colectomy.  CHA2DS@VASc  = 1.   Tobacco abuse    Varicose veins of both lower extremities     Past Surgical History:  Procedure Laterality Date   BACK SURGERY  2003   HEMORRHOID SURGERY N/A 11/13/2014   Procedure: LEFT LATERAL HEMORRHOIDECTOMY  ;  Surgeon: [name], MD;  Location: [facility] ORS;  Service: General;  Laterality: N/A;   LAPAROTOMY Right 08/30/2020   Procedure: EXPLORATORY LAPAROTOMY WITH RIGHT COLOCTOMY;  Surgeon: [name], MD;  Location: [facility] ORS;  Service: General;  Laterality: Right;     Current Outpatient Medications  Medication Sig Dispense Refill   metoprolol tartrate (LOPRESSOR) 50 MG tablet Take 1.5 tablets (75 mg total) by mouth 2 (two) times daily. 90 tablet 0   Multiple Vitamins-Minerals (MULTIVITAMIN ADULT PO) Take 1 tablet by mouth daily.     rivaroxaban (XARELTO) 20 MG TABS tablet Take 1 tablet (20 mg total) by mouth daily with supper. 30 tablet 11   sildenafil (VIAGRA) 50 MG tablet TAKE 1 TABLET BY MOUTH DAILY AS NEEDED FOR ERECTILE DYSFUNCTION 6 tablet 3   No current facility-administered medications for this visit.    Allergies:  Patient has no known allergies.    Social History:  The patient  reports that he has been smoking cigarettes. He has been smoking an average of 0.50 packs per day. He has never used smokeless tobacco. He reports current alcohol use of about 20.0 standard drinks of alcohol per week. He reports that he does not use drugs.   Family History:  The patient's family history includes Diabetes in his father; Heart attack in his mother; Hypertension in his father.    ROS:  Please see the history of present illness.   Otherwise, review of systems are positive for none.   All other systems are  reviewed and negative.    PHYSICAL EXAM: VS:  BP (!) 150/82 (BP Location: Left Arm, Patient Position: Sitting, Cuff Size: Normal)   Pulse 75   Ht 6' (1.829 m)   Wt 180 lb (81.6 kg)   SpO2 98%   BMI 24.41 kg/m  , BMI Body mass index is 24.41 kg/m. GEN: Well nourished, well developed, in no acute distress  HEENT: normal  Neck: no JVD, carotid bruits, or masses Cardiac: RRR; no murmurs, rubs, or gallops,no edema  Respiratory:  clear to auscultation bilaterally, normal work of breathing GI: soft, nontender, nondistended, + BS MS: no deformity or atrophy  Skin: warm and dry, no rash Neuro:  Strength and sensation are intact Psych: euthymic mood, full affect Vascular: Radial pulses normal bilaterally.  Femoral pulse is +1 bilaterally.  Distal pulses are not palpable.  A 1 cm ulceration is noted on the medial side of the left ankle which is superficial.   EKG:  EKG is not ordered today.    Recent Labs: 08/30/2020: TSH 0.864 09/01/2020: ALT 28 09/03/2020: B Natriuretic Peptide 436.6 09/25/2020: BUN 11; Creatinine, Ser 0.63; Hemoglobin 12.1; Magnesium 2.0; Platelets 344; Potassium 4.2; Sodium 137    Lipid Panel    Component Value Date/Time   CHOL 151 07/03/2019 0816   TRIG 48 09/01/2020 0619   HDL 100 07/03/2019 0816   CHOLHDL 1.5 07/03/2019 0816   LDLCALC 42 07/03/2019 0816      Wt Readings from Last 3 Encounters:  11/13/20 180 lb (81.6 kg)  11/05/20 180 lb 4 oz (81.8 kg)  10/28/20 179 lb (81.2 kg)       PAD Screen 11/13/2020  Previous PAD dx? No  Previous surgical procedure? Yes  Dates of procedures knee replacement  Pain with walking? Yes  Subsides with rest? No  Feet/toe relief with dangling? No  Painful, non-healing ulcers? Yes  Extremities discolored? Yes      ASSESSMENT AND PLAN:  1.  Peripheral arterial disease with severe bilateral leg claudication worse on the left than the right.  Slow healing ulceration on the medial aspect of the left ankle.  The  patient has evidence of severe peripheral arterial disease.  I discussed with him the natural history disease.  I recommend proceeding with abdominal aortogram with lower extremity runoff and possible endovascular intervention. Hold Xarelto 2 days before. Radial access should be considered given involvement of bilateral common femoral arteries. I suggested proceeding with the procedure sooner than later but the patient has to make arrangements and will not be available until early August.  2.  Paroxysmal atrial fibrillation: Seems to be maintaining in sinus rhythm.  He is on long-term anticoagulation with Xarelto with a CHA2DS2-VASc score of 2.  Hold Xarelto 2 days before the procedure.  3.  The patient does not have hyperlipidemia but given his extensive peripheral  arterial disease, I recommend treatment with atorvastatin 10 mg daily.  4.  Tobacco use: I discussed with him the importance of smoking cessation.    Disposition:   FU 2 weeks after angiogram.  Signed,  [name], MD  11/13/2020 3:17 PM    [place]

## 2020-11-17 ENCOUNTER — Telehealth: Payer: Self-pay | Admitting: Gastroenterology

## 2020-11-17 MED ORDER — CLENPIQ 10-3.5-12 MG-GM -GM/160ML PO SOLN: 320.0000 mL | Freq: Once | ORAL | 0 refills | Status: AC

## 2020-11-17 NOTE — Telephone Encounter (Signed)
Pateint states that his pharmacy does not have his prep RX, please call to advise

## 2020-11-17 NOTE — Telephone Encounter (Signed)
Resent Sent prep to the pharmacy

## 2020-11-18 ENCOUNTER — Encounter: Payer: 59 | Attending: Physician Assistant | Admitting: Physician Assistant

## 2020-11-18 ENCOUNTER — Other Ambulatory Visit: Payer: Self-pay | Admitting: Nurse Practitioner

## 2020-11-18 ENCOUNTER — Other Ambulatory Visit: Payer: Self-pay

## 2020-11-18 DIAGNOSIS — L97828 Non-pressure chronic ulcer of other part of left lower leg with other specified severity: Secondary | ICD-10-CM | POA: Insufficient documentation

## 2020-11-18 DIAGNOSIS — Z7901 Long term (current) use of anticoagulants: Secondary | ICD-10-CM | POA: Insufficient documentation

## 2020-11-18 DIAGNOSIS — I480 Paroxysmal atrial fibrillation: Secondary | ICD-10-CM | POA: Diagnosis not present

## 2020-11-18 DIAGNOSIS — I890 Lymphedema, not elsewhere classified: Secondary | ICD-10-CM | POA: Diagnosis not present

## 2020-11-18 DIAGNOSIS — I872 Venous insufficiency (chronic) (peripheral): Secondary | ICD-10-CM | POA: Insufficient documentation

## 2020-11-18 NOTE — Progress Notes (Signed)
Henry, Hill ([id number]) Visit Report for 11/18/2020 Arrival Information Details Patient Name: Henry Hill, COIN. Date of Service: 11/18/2020 9:30 AM Medical Record Number: [id number] Patient Account Number: [id number] Date of Birth/Sex: February 22, 1961 (60 y.o. M) Treating RN: Donnamarie Poag Primary Care Ilia Engelbert: Vernie Murders Other Clinician: Referring Jazzmin Newbold: Vernie Murders Treating Anterrio Mccleery/Extender: Skipper Cliche in Treatment: 9 Visit Information History Since Last Visit Added or deleted any medications: No Patient Arrived: Ambulatory Had a fall or experienced change in No Arrival Time: 09:20 activities of daily living that may affect Accompanied By: self risk of falls: Transfer Assistance: None Hospitalized since last visit: No Patient Identification Verified: Yes Has Dressing in Place as Prescribed: Yes Secondary Verification Process Completed: Yes Pain Present Now: No Patient Requires Transmission-Based No Precautions: Patient Has Alerts: Yes Patient Alerts: Patient on Blood Thinner ELIQUIS NOT A DIABETIC Electronic Signature(s) Signed: 11/18/2020 12:48:21 PM By: Donnamarie Poag Entered By: Donnamarie Poag on 11/18/2020 09:19:24 Henry Hill ([id number]) -------------------------------------------------------------------------------- Clinic Level of Care Assessment Details Patient Name: Henry Hill Date of Service: 11/18/2020 9:30 AM Medical Record Number: [id number] Patient Account Number: [id number] Date of Birth/Sex: 08-05-1960 (60 y.o. M) Treating RN: Donnamarie Poag Primary Care Jolanta Cabeza: Vernie Murders Other Clinician: Referring Amesha Bailey: Vernie Murders Treating Nealy Hickmon/Extender: Skipper Cliche in Treatment: 9 Clinic Level of Care Assessment Items TOOL 4 Quantity Score []  - Use when only an EandM is performed on FOLLOW-UP visit 0 ASSESSMENTS - Nursing Assessment / Reassessment []  - Reassessment of Co-morbidities (includes updates in patient status)  0 []  - 0 Reassessment of Adherence to Treatment Plan ASSESSMENTS - Wound and Skin Assessment / Reassessment X - Simple Wound Assessment / Reassessment - one wound 1 5 []  - 0 Complex Wound Assessment / Reassessment - multiple wounds []  - 0 Dermatologic / Skin Assessment (not related to wound area) ASSESSMENTS - Focused Assessment []  - Circumferential Edema Measurements - multi extremities 0 []  - 0 Nutritional Assessment / Counseling / Intervention []  - 0 Lower Extremity Assessment (monofilament, tuning fork, pulses) []  - 0 Peripheral Arterial Disease Assessment (using hand held doppler) ASSESSMENTS - Ostomy and/or Continence Assessment and Care []  - Incontinence Assessment and Management 0 []  - 0 Ostomy Care Assessment and Management (repouching, etc.) PROCESS - Coordination of Care X - Simple Patient / Family Education for ongoing care 1 15 []  - 0 Complex (extensive) Patient / Family Education for ongoing care []  - 0 Staff obtains Programmer, systems, Records, Test Results / Process Orders []  - 0 Staff telephones HHA, Nursing Homes / Clarify orders / etc []  - 0 Routine Transfer to another Facility (non-emergent condition) []  - 0 Routine Hospital Admission (non-emergent condition) []  - 0 New Admissions / Biomedical engineer / Ordering NPWT, Apligraf, etc. []  - 0 Emergency Hospital Admission (emergent condition) X- 1 10 Simple Discharge Coordination []  - 0 Complex (extensive) Discharge Coordination PROCESS - Special Needs []  - Pediatric / Minor Patient Management 0 []  - 0 Isolation Patient Management []  - 0 Hearing / Language / Visual special needs []  - 0 Assessment of Community assistance (transportation, D/C planning, etc.) []  - 0 Additional assistance / Altered mentation []  - 0 Support Surface(s) Assessment (bed, cushion, seat, etc.) INTERVENTIONS - Wound Cleansing / Measurement Henry Hill, Henry C. ([id number]) X- 1 5 Simple Wound Cleansing - one wound []  - 0 Complex  Wound Cleansing - multiple wounds []  - 0 Wound Imaging (photographs - any number of wounds) []  - 0 Wound Tracing (instead of photographs) X- 1 5 Simple Wound Measurement -  one wound []  - 0 Complex Wound Measurement - multiple wounds INTERVENTIONS - Wound Dressings X - Small Wound Dressing one or multiple wounds 1 10 []  - 0 Medium Wound Dressing one or multiple wounds []  - 0 Large Wound Dressing one or multiple wounds []  - 0 Application of Medications - topical []  - 0 Application of Medications - injection INTERVENTIONS - Miscellaneous []  - External ear exam 0 []  - 0 Specimen Collection (cultures, biopsies, blood, body fluids, etc.) []  - 0 Specimen(s) / Culture(s) sent or taken to Lab for analysis []  - 0 Patient Transfer (multiple staff / Civil Service fast streamer / Similar devices) []  - 0 Simple Staple / Suture removal (25 or less) []  - 0 Complex Staple / Suture removal (26 or more) []  - 0 Hypo / Hyperglycemic Management (close monitor of Blood Glucose) []  - 0 Ankle / Brachial Index (ABI) - do not check if billed separately X- 1 5 Vital Signs Has the patient been seen at the hospital within the last three years: Yes Total Score: 55 Level Of Care: New/Established - Level 2 Electronic Signature(s) Signed: 11/18/2020 12:48:21 PM By: Donnamarie Poag Entered By: Donnamarie Poag on 11/18/2020 09:37:35 Henry Hill ([id number]) -------------------------------------------------------------------------------- Encounter Discharge Information Details Patient Name: Henry Hill, Henry C. Date of Service: 11/18/2020 9:30 AM Medical Record Number: [id number] Patient Account Number: [id number] Date of Birth/Sex: January 17, 1961 (60 y.o. M) Treating RN: Donnamarie Poag Primary Care Lorrinda Ramstad: Vernie Murders Other Clinician: Referring Mega Kinkade: Vernie Murders Treating Tavius Turgeon/Extender: Skipper Cliche in Treatment: 9 Encounter Discharge Information Items Discharge Condition: Stable Ambulatory Status:  Ambulatory Discharge Destination: Home Transportation: Private Auto Accompanied By: self Schedule Follow-up Appointment: No Clinical Summary of Care: Electronic Signature(s) Signed: 11/18/2020 12:48:21 PM By: Donnamarie Poag Entered By: Donnamarie Poag on 11/18/2020 09:38:38 Henry Hill ([id number]) -------------------------------------------------------------------------------- Lower Extremity Assessment Details Patient Name: Henry Hill, Henry Hill. Date of Service: 11/18/2020 9:30 AM Medical Record Number: [id number] Patient Account Number: [id number] Date of Birth/Sex: 1961-02-12 (60 y.o. M) Treating RN: Donnamarie Poag Primary Care Elvin Banker: Vernie Murders Other Clinician: Referring Yeimi Debnam: Vernie Murders Treating Tad Fancher/Extender: Skipper Cliche in Treatment: 9 Edema Assessment Assessed: [Left: Yes] [Right: No] Edema: [Left: N] [Right: o] Calf Left: Right: Point of Measurement: From Medial Instep 34.5 cm Ankle Left: Right: Point of Measurement: From Medial Instep 24.5 cm Knee To Floor Left: Right: From Medial Instep 43 cm Electronic Signature(s) Signed: 11/18/2020 12:48:21 PM By: Donnamarie Poag Entered By: Donnamarie Poag on 11/18/2020 09:39:48 Henry Hill, Henry Hill ([id number]) -------------------------------------------------------------------------------- Multi Wound Chart Details Patient Name: Henry Hill. Date of Service: 11/18/2020 9:30 AM Medical Record Number: [id number] Patient Account Number: [id number] Date of Birth/Sex: December 09, 1960 (60 y.o. M) Treating RN: Donnamarie Poag Primary Care Nazly Digilio: Vernie Murders Other Clinician: Referring Shakaria Raphael: Vernie Murders Treating Tysean Vandervliet/Extender: Skipper Cliche in Treatment: 9 Vital Signs Height(in): 72 Pulse(bpm): 64 Weight(lbs): 170 Blood Pressure(mmHg): 171/84 Body Mass Index(BMI): 23 Temperature(F): 98.0 Respiratory Rate(breaths/min): 16 Photos: [N/A:N/A] Wound Location: Left, Medial Lower Leg N/A N/A Wounding Event:  Gradually Appeared N/A N/A Primary Etiology: Venous Leg Ulcer N/A N/A Comorbid History: Arrhythmia, Hypertension N/A N/A Date Acquired: 08/09/2019 N/A N/A Weeks of Treatment: 9 N/A N/A Wound Status: Open N/A N/A Measurements L x W x D (cm) 0.2x0.2x0.1 N/A N/A Area (cm) : 0.031 N/A N/A Volume (cm) : 0.003 N/A N/A % Reduction in Area: 99.00% N/A N/A % Reduction in Volume: 99.00% N/A N/A Classification: Full Thickness Without Exposed N/A N/A Support Structures Exudate Amount: None Present N/A N/A Granulation Amount: None  Present (0%) N/A N/A Necrotic Amount: Large (67-100%) N/A N/A Necrotic Tissue: Eschar N/A N/A Exposed Structures: Fat Layer (Subcutaneous Tissue): N/A N/A Yes Fascia: No Tendon: No Muscle: No Joint: No Bone: No Epithelialization: Medium (34-66%) N/A N/A Treatment Notes Electronic Signature(s) Signed: 11/18/2020 12:48:21 PM By: Donnamarie Poag Entered By: Donnamarie Poag on 11/18/2020 09:26:02 Henry Hill ([id number]) -------------------------------------------------------------------------------- Multi-Disciplinary Care Plan Details Patient Name: Henry Hill, LOVVORN. Date of Service: 11/18/2020 9:30 AM Medical Record Number: [id number] Patient Account Number: [id number] Date of Birth/Sex: 19-Feb-1961 (60 y.o. M) Treating RN: Donnamarie Poag Primary Care Kaysha Parsell: Vernie Murders Other Clinician: Referring Srah Ake: Vernie Murders Treating Aleira Deiter/Extender: Skipper Cliche in Treatment: 9 Active Inactive Electronic Signature(s) Signed: 11/18/2020 12:48:21 PM By: Donnamarie Poag Entered By: Donnamarie Poag on 11/18/2020 09:25:55 Henry Hill ([id number]) -------------------------------------------------------------------------------- Pain Assessment Details Patient Name: Henry Hill, PENLEY. Date of Service: 11/18/2020 9:30 AM Medical Record Number: [id number] Patient Account Number: [id number] Date of Birth/Sex: 08/07/1960 (60 y.o. M) Treating RN: Donnamarie Poag Primary Care  Dazaria Macneill: Vernie Murders Other Clinician: Referring Sheily Lineman: Vernie Murders Treating Dewitte Vannice/Extender: Skipper Cliche in Treatment: 9 Active Problems Location of Pain Severity and Description of Pain Patient Has Paino No Site Locations Rate the pain. Current Pain Level: 0 Pain Management and Medication Current Pain Management: Electronic Signature(s) Signed: 11/18/2020 12:48:21 PM By: Donnamarie Poag Entered By: Donnamarie Poag on 11/18/2020 09:22:08 Henry Hill ([id number]) -------------------------------------------------------------------------------- Patient/Caregiver Education Details Patient Name: Henry Hill Date of Service: 11/18/2020 9:30 AM Medical Record Number: [id number] Patient Account Number: [id number] Date of Birth/Gender: Sep 27, 1960 (60 y.o. M) Treating RN: Donnamarie Poag Primary Care Physician: Vernie Murders Other Clinician: Referring Physician: Vernie Murders Treating Physician/Extender: Skipper Cliche in Treatment: 9 Education Assessment Education Provided To: Patient Education Topics Provided Basic Hygiene: Wound/Skin Impairment: Engineer, maintenance) Signed: 11/18/2020 12:48:21 PM By: Donnamarie Poag Entered By: Donnamarie Poag on 11/18/2020 09:34:24 Henry Hill ([id number]) -------------------------------------------------------------------------------- Wound Assessment Details Patient Name: Henry Hill, Henry Hill. Date of Service: 11/18/2020 9:30 AM Medical Record Number: [id number] Patient Account Number: [id number] Date of Birth/Sex: October 21, 1960 (60 y.o. M) Treating RN: Donnamarie Poag Primary Care Kenzlei Runions: Vernie Murders Other Clinician: Referring Fleet Higham: Vernie Murders Treating Maxey Ransom/Extender: Skipper Cliche in Treatment: 9 Wound Status Wound Number: 1 Primary Etiology: Venous Leg Ulcer Wound Location: Left, Medial Lower Leg Wound Status: Open Wounding Event: Gradually Appeared Comorbid History: Arrhythmia, Hypertension Date  Acquired: 08/09/2019 Weeks Of Treatment: 9 Clustered Wound: No Photos Wound Measurements Length: (cm) 0 Width: (cm) 0 Depth: (cm) 0 Area: (cm) 0 Volume: (cm) 0 % Reduction in Area: 100% % Reduction in Volume: 100% Epithelialization: Medium (34-66%) Tunneling: No Undermining: No Wound Description Classification: Full Thickness Without Exposed Support Structure Exudate Amount: None Present s Foul Odor After Cleansing: No Slough/Fibrino Yes Wound Bed Granulation Amount: None Present (0%) Exposed Structure Necrotic Amount: Large (67-100%) Fascia Exposed: No Necrotic Quality: Eschar Fat Layer (Subcutaneous Tissue) Exposed: Yes Tendon Exposed: No Muscle Exposed: No Joint Exposed: No Bone Exposed: No Treatment Notes Wound #1 (Lower Leg) Wound Laterality: Left, Medial Cleanser Peri-Wound Care Topical Primary Dressing Henry Hill, Henry Hill ([id number]) Secondary Dressing Secured With Compression Wrap Compression Stockings Add-Ons Electronic Signature(s) Signed: 11/18/2020 12:48:21 PM By: Donnamarie Poag Entered By: Donnamarie Poag on 11/18/2020 09:37:54 Henry Hill, Henry Hill ([id number]) -------------------------------------------------------------------------------- Vitals Details Patient Name: Henry Hill. Date of Service: 11/18/2020 9:30 AM Medical Record Number: [id number] Patient Account Number: [id number] Date of Birth/Sex: 12-17-1960 (60 y.o. M) Treating RN: Donnamarie Poag Primary Care Jaedah Lords: Vernie Murders Other Clinician:  Referring Mariam Helbert: Vernie Murders Treating Kensie Susman/Extender: Skipper Cliche in Treatment: 9 Vital Signs Time Taken: 09:23 Temperature (F): 98.0 Height (in): 72 Pulse (bpm): 73 Weight (lbs): 170 Respiratory Rate (breaths/min): 16 Body Mass Index (BMI): 23.1 Blood Pressure (mmHg): 171/84 Reference Range: 80 - 120 mg / dl Electronic Signature(s) Signed: 11/18/2020 12:48:21 PM By: Donnamarie Poag Entered ByDonnamarie Poag on 11/18/2020 09:21:56

## 2020-11-18 NOTE — Progress Notes (Addendum)
ESIQUIO, BOESEN ([id number]) Visit Report for 11/18/2020 Chief Complaint Document Details Patient Name: Henry Hill, Henry Hill. Date of Service: 11/18/2020 9:30 AM Medical Record Number: [id number] Patient Account Number: [id number] Date of Birth/Sex: Nov 26, 1960 (60 y.o. M) Treating RN: Dolan Amen Primary Care Provider: Vernie Murders Other Clinician: Referring Provider: Vernie Murders Treating Provider/Extender: Skipper Cliche in Treatment: 9 Information Obtained from: Patient Chief Complaint Left LE Ulcer Electronic Signature(s) Signed: 11/18/2020 9:29:57 AM By: Worthy Keeler PA-C Entered By: Worthy Keeler on 11/18/2020 09:29:57 Henry Hill ([id number]) -------------------------------------------------------------------------------- HPI Details Patient Name: Henry Hill Date of Service: 11/18/2020 9:30 AM Medical Record Number: [id number] Patient Account Number: [id number] Date of Birth/Sex: [birth year] (60 y.o. M) Treating RN: Dolan Amen Primary Care Provider: Vernie Murders Other Clinician: Referring Provider: Vernie Murders Treating Provider/Extender: Skipper Cliche in Treatment: 9 History of Present Illness HPI Description: 09/15/2020 upon evaluation today patient appears to be doing somewhat poorly in regard to the wound that he tells me has been dealing with for about a year. Has been working with his primary care provider to try to help get this wound healed. He comes in today as he states that they had "failed" and getting this to completely close. Fortunately there does not appear to be any signs of active infection he did have a lot of eschar and dried drainage on the surface of the wound area. With that being said we were able to carefully remove this today I did not even have to perform debridement. This appears to be doing quite well once removed. He does have a history of chronic venous insufficiency, peripheral vascular disease, atrial fibrillation,  long-term use of anticoagulant therapy which has recently been started with Eliquis due to the fact that he was diagnosed with A. fib when he was in the hospital due to a bowel obstruction. He also has hypertension. 09/22/2020 upon evaluation today patient appears to be doing well with regard to his wound. He has been tolerating the dressing changes without complication. Fortunately there is no signs of active infection at this time. No fevers, chills, nausea, vomiting, or diarrhea. 5/23; patient with a wound on the left medial lower extremity. History of significant chronic venous insufficiency apparently has had procedures done by Dr. Lucky Cowboy he also had an ABI in this clinic of 0.59 he has limiting claudication although I could not quantify this. He is going for noninvasive arterial studies but this is being delayed as he apparently is having a cardiac stress test related to recent atrial fibrillation. He is using collagen 10/07/2020 upon evaluation today patient appears to be doing well in regard to his ankle ulcer. In fact this appears to be doing significantly better and is much smaller. He is going require some sharp debridement but again this is really not significantly problematic as I believe he is for the most part completely healed underneath the majority the area that I want to remove some of the necrotic debris. This seems to be more drainage and dry skin than anything. 10/21/2020 upon evaluation today patient appears to be doing well with regard to his wound. He has been tolerating the/40 dressing changes without complication. Fortunately there is no signs of active infection at this time. 11/04/2020 upon evaluation today patient's wound actually showed signs of improvement. I am actually very pleased with where things stand. There was some dried drainage when I remove this most of everything underneath was completely healed. There is just a very small opening it  still very painful for him. He  did have arterial studies that showed that he had ABIs bilaterally of 0.67. On the right he had an TBI of 0.46 and on the left a TBI of 0.40. There is moderate peripheral vascular disease noted as such. He does have an appointment with the cardiologist tomorrow actually. They will be discussing these results and what to do going forward. 11/18/2020 upon evaluation today patient appears to be doing well with regard to his wounds in fact everything appears to be showing signs of excellent improvement in regard to the medial ankle wound at this time. I do not see any evidence of infection which is great news and overall I think that the patient is making excellent progress. Fortunately there does not appear to be any signs of active infection at this time that is great news as well. In fact I think he appears to be completely healed. Electronic Signature(s) Signed: 11/18/2020 6:14:44 PM By: Worthy Keeler PA-C Entered By: Worthy Keeler on 11/18/2020 18:14:44 Henry Hill ([id number]) -------------------------------------------------------------------------------- Physical Exam Details Patient Name: Henry Hill, Henry Hill. Date of Service: 11/18/2020 9:30 AM Medical Record Number: [id number] Patient Account Number: [id number] Date of Birth/Sex: 03/02/1961 (60 y.o. M) Treating RN: Dolan Amen Primary Care Provider: Vernie Murders Other Clinician: Referring Provider: Vernie Murders Treating Provider/Extender: Skipper Cliche in Treatment: 9 Constitutional Well-nourished and well-hydrated in no acute distress. Respiratory normal breathing without difficulty. Psychiatric this patient is able to make decisions and demonstrates good insight into disease process. Alert and Oriented x 3. pleasant and cooperative. Notes Patient's wound bed showed signs of complete epithelization and overall is doing great I think at this point he is ready for discharge and I do not see any signs of active infection  I would recommend that he just continue to protect the area for maybe the next week or so after that I think he is okay to not wear dressing over it any longer. Electronic Signature(s) Signed: 11/18/2020 6:15:04 PM By: Worthy Keeler PA-C Entered By: Worthy Keeler on 11/18/2020 18:15:03 Henry Hill ([id number]) -------------------------------------------------------------------------------- Physician Orders Details Patient Name: Henry Hill, Henry Hill. Date of Service: 11/18/2020 9:30 AM Medical Record Number: [id number] Patient Account Number: [id number] Date of Birth/Sex: 1961/04/15 (59 y.o. M) Treating RN: Donnamarie Poag Primary Care Provider: Vernie Murders Other Clinician: Referring Provider: Vernie Murders Treating Provider/Extender: Skipper Cliche in Treatment: 9 Verbal / Phone Orders: No Diagnosis Coding ICD-10 Coding Code Description I87.2 Venous insufficiency (chronic) (peripheral) L97.828 Non-pressure chronic ulcer of other part of left lower leg with other specified severity I48.0 Paroxysmal atrial fibrillation Z79.01 Long term (current) use of anticoagulants I10 Essential (primary) hypertension Discharge From [facility] Services o Discharge from [place] [facility] Complete - keep healed wound covered for a few weeks for protection Edema Control - Lymphedema / Segmental Compressive Device / Other o Patient to wear own compression stockings. Remove compression stockings every night before going to bed and put on every morning when getting up. - See [employer] note with measurements if desire to purchase more compression socks locally. Wound Treatment Electronic Signature(s) Signed: 11/18/2020 12:48:21 PM By: Donnamarie Poag Signed: 11/18/2020 6:20:26 PM By: Worthy Keeler PA-C Entered By: Donnamarie Poag on 11/18/2020 09:37:08 Henry Hill ([id number]) -------------------------------------------------------------------------------- Problem List Details Patient  Name: Henry Hill, Henry Hill. Date of Service: 11/18/2020 9:30 AM Medical Record Number: [id number] Patient Account Number: [id number] Date of Birth/Sex: February 05, 1961 (60 y.o. M) Treating RN: Dolan Amen Primary Care  Provider: Vernie Murders Other Clinician: Referring Provider: Vernie Murders Treating Provider/Extender: Skipper Cliche in Treatment: 9 Active Problems ICD-10 Encounter Code Description Active Date MDM Diagnosis I87.2 Venous insufficiency (chronic) (peripheral) 09/15/2020 No Yes L97.828 Non-pressure chronic ulcer of other part of left lower leg with other 09/15/2020 No Yes specified severity I48.0 Paroxysmal atrial fibrillation 09/15/2020 No Yes Z79.01 Long term (current) use of anticoagulants 09/15/2020 No Yes I10 Essential (primary) hypertension 09/15/2020 No Yes Inactive Problems Resolved Problems Electronic Signature(s) Signed: 11/18/2020 9:29:52 AM By: Worthy Keeler PA-C Entered By: Worthy Keeler on 11/18/2020 09:29:52 Henry Hill ([id number]) -------------------------------------------------------------------------------- Progress Note Details Patient Name: Henry Hill. Date of Service: 11/18/2020 9:30 AM Medical Record Number: [id number] Patient Account Number: [id number] Date of Birth/Sex: Aug 22, 1960 (60 y.o. M) Treating RN: Dolan Amen Primary Care Provider: Vernie Murders Other Clinician: Referring Provider: Vernie Murders Treating Provider/Extender: Skipper Cliche in Treatment: 9 Subjective Chief Complaint Information obtained from Patient Left LE Ulcer History of Present Illness (HPI) 09/15/2020 upon evaluation today patient appears to be doing somewhat poorly in regard to the wound that he tells me has been dealing with for about a year. Has been working with his primary care provider to try to help get this wound healed. He comes in today as he states that they had "failed" and getting this to completely close. Fortunately there does not appear  to be any signs of active infection he did have a lot of eschar and dried drainage on the surface of the wound area. With that being said we were able to carefully remove this today I did not even have to perform debridement. This appears to be doing quite well once removed. He does have a history of chronic venous insufficiency, peripheral vascular disease, atrial fibrillation, long-term use of anticoagulant therapy which has recently been started with Eliquis due to the fact that he was diagnosed with A. fib when he was in the hospital due to a bowel obstruction. He also has hypertension. 09/22/2020 upon evaluation today patient appears to be doing well with regard to his wound. He has been tolerating the dressing changes without complication. Fortunately there is no signs of active infection at this time. No fevers, chills, nausea, vomiting, or diarrhea. 5/23; patient with a wound on the left medial lower extremity. History of significant chronic venous insufficiency apparently has had procedures done by Dr. Lucky Cowboy he also had an ABI in this clinic of 0.59 he has limiting claudication although I could not quantify this. He is going for noninvasive arterial studies but this is being delayed as he apparently is having a cardiac stress test related to recent atrial fibrillation. He is using collagen 10/07/2020 upon evaluation today patient appears to be doing well in regard to his ankle ulcer. In fact this appears to be doing significantly better and is much smaller. He is going require some sharp debridement but again this is really not significantly problematic as I believe he is for the most part completely healed underneath the majority the area that I want to remove some of the necrotic debris. This seems to be more drainage and dry skin than anything. 10/21/2020 upon evaluation today patient appears to be doing well with regard to his wound. He has been tolerating the/40 dressing changes without  complication. Fortunately there is no signs of active infection at this time. 11/04/2020 upon evaluation today patient's wound actually showed signs of improvement. I am actually very pleased with where things stand. There was  some dried drainage when I remove this most of everything underneath was completely healed. There is just a very small opening it still very painful for him. He did have arterial studies that showed that he had ABIs bilaterally of 0.67. On the right he had an TBI of 0.46 and on the left a TBI of 0.40. There is moderate peripheral vascular disease noted as such. He does have an appointment with the cardiologist tomorrow actually. They will be discussing these results and what to do going forward. 11/18/2020 upon evaluation today patient appears to be doing well with regard to his wounds in fact everything appears to be showing signs of excellent improvement in regard to the medial ankle wound at this time. I do not see any evidence of infection which is great news and overall I think that the patient is making excellent progress. Fortunately there does not appear to be any signs of active infection at this time that is great news as well. In fact I think he appears to be completely healed. Objective Constitutional Well-nourished and well-hydrated in no acute distress. Vitals Time Taken: 9:23 AM, Height: 72 in, Weight: 170 lbs, BMI: 23.1, Temperature: 98.0 F, Pulse: 73 bpm, Respiratory Rate: 16 breaths/min, Blood Pressure: 171/84 mmHg. Respiratory normal breathing without difficulty. Psychiatric this patient is able to make decisions and demonstrates good insight into disease process. Alert and Oriented x 3. pleasant and cooperative. General Notes: Patient's wound bed showed signs of complete epithelization and overall is doing great I think at this point he is ready for Henry Hill, Henry Hill. ([id number]) discharge and I do not see any signs of active infection I would recommend  that he just continue to protect the area for maybe the next week or so after that I think he is okay to not wear dressing over it any longer. Integumentary (Hair, Skin) Wound #1 status is Open. Original cause of wound was Gradually Appeared. The date acquired was: 08/09/2019. The wound has been in treatment 9 weeks. The wound is located on the Left,Medial Lower Leg. The wound measures 0cm length x 0cm width x 0cm depth; 0cm^2 area and 0cm^3 volume. There is Fat Layer (Subcutaneous Tissue) exposed. There is no tunneling or undermining noted. There is a none present amount of drainage noted. There is no granulation within the wound bed. There is a large (67-100%) amount of necrotic tissue within the wound bed including Eschar. Assessment Active Problems ICD-10 Venous insufficiency (chronic) (peripheral) Non-pressure chronic ulcer of other part of left lower leg with other specified severity Paroxysmal atrial fibrillation Long term (current) use of anticoagulants Essential (primary) hypertension Plan Discharge From [facility] Services: Discharge from [facility] - keep healed wound covered for a few weeks for protection Edema Control - Lymphedema / Segmental Compressive Device / Other: Patient to wear own compression stockings. Remove compression stockings every night before going to bed and put on every morning when getting up. - See [employer] note with measurements if desire to purchase more compression socks locally. 1. I would recommend currently that we go ahead and continue with the protective dressing for the time although I think the patient is completely healed he is in agreement with that plan. 2. I am also can recommend that he continue to wear his compression sock I think this is of utmost importance. We will see patient back for reevaluation in 1 week here in the clinic. If anything worsens or changes patient will contact our office for  additional recommendations. Electronic Signature(s) Signed: 11/18/2020 6:15:31 PM By: Worthy Keeler PA-C Entered By: Worthy Keeler on 11/18/2020 18:15:31 Elks, Jillene Bucks ([id number]) -------------------------------------------------------------------------------- SuperBill Details Patient Name: Henry Hill Date of Service: 11/18/2020 Medical Record Number: [id number] Patient Account Number: [id number] Date of Birth/Sex: Nov 26, 1960 (60 y.o. M) Treating RN: Donnamarie Poag Primary Care Provider: Vernie Murders Other Clinician: Referring Provider: Vernie Murders Treating Provider/Extender: Skipper Cliche in Treatment: 9 Diagnosis Coding ICD-10 Codes Code Description I87.2 Venous insufficiency (chronic) (peripheral) L97.828 Non-pressure chronic ulcer of other part of left lower leg with other specified severity I48.0 Paroxysmal atrial fibrillation Z79.01 Long term (current) use of anticoagulants I10 Essential (primary) hypertension Facility Procedures CPT4 Code: [id number] Description: [phone] - WOUND CARE VISIT-LEV 2 EST PT Modifier: Quantity: 1 Physician Procedures CPT4 Code: [id number] Description: [id number] - WC PHYS LEVEL 3 - EST PT Modifier: Quantity: 1 CPT4 Code: Description: ICD-10 Diagnosis Description I87.2 Venous insufficiency (chronic) (peripheral) I48.0 Paroxysmal atrial fibrillation L97.828 Non-pressure chronic ulcer of other part of left lower leg with other spe Z79.01 Long term (current) use of  anticoagulants Modifier: cified severity Quantity: Electronic Signature(s) Signed: 11/18/2020 6:15:44 PM By: Worthy Keeler PA-C Previous Signature: 11/18/2020 12:48:21 PM Version By: Donnamarie Poag Entered By: Worthy Keeler on 11/18/2020 18:15:44

## 2020-11-24 ENCOUNTER — Ambulatory Visit
Admission: RE | Admit: 2020-11-24 | Discharge: 2020-11-24 | Disposition: A | Discharge status: Home / Self Care | Payer: 59 | Attending: Gastroenterology | Admitting: Gastroenterology

## 2020-11-24 ENCOUNTER — Other Ambulatory Visit: Payer: Self-pay

## 2020-11-24 ENCOUNTER — Encounter
Admission: RE | Disposition: A | Discharge status: Home / Self Care | Payer: Self-pay | Source: Home / Self Care | Attending: Gastroenterology

## 2020-11-24 ENCOUNTER — Encounter: Payer: Self-pay | Admitting: Gastroenterology

## 2020-11-24 ENCOUNTER — Ambulatory Visit: Payer: 59 | Admitting: Anesthesiology

## 2020-11-24 DIAGNOSIS — Z9049 Acquired absence of other specified parts of digestive tract: Secondary | ICD-10-CM | POA: Diagnosis not present

## 2020-11-24 DIAGNOSIS — Z980 Intestinal bypass and anastomosis status: Secondary | ICD-10-CM | POA: Insufficient documentation

## 2020-11-24 DIAGNOSIS — F17210 Nicotine dependence, cigarettes, uncomplicated: Secondary | ICD-10-CM | POA: Insufficient documentation

## 2020-11-24 DIAGNOSIS — K621 Rectal polyp: Secondary | ICD-10-CM | POA: Diagnosis not present

## 2020-11-24 DIAGNOSIS — D127 Benign neoplasm of rectosigmoid junction: Secondary | ICD-10-CM | POA: Diagnosis not present

## 2020-11-24 DIAGNOSIS — D124 Benign neoplasm of descending colon: Secondary | ICD-10-CM | POA: Diagnosis not present

## 2020-11-24 DIAGNOSIS — K635 Polyp of colon: Secondary | ICD-10-CM | POA: Diagnosis not present

## 2020-11-24 DIAGNOSIS — Z09 Encounter for follow-up examination after completed treatment for conditions other than malignant neoplasm: Secondary | ICD-10-CM | POA: Diagnosis not present

## 2020-11-24 DIAGNOSIS — D128 Benign neoplasm of rectum: Secondary | ICD-10-CM | POA: Insufficient documentation

## 2020-11-24 DIAGNOSIS — Z7901 Long term (current) use of anticoagulants: Secondary | ICD-10-CM | POA: Insufficient documentation

## 2020-11-24 DIAGNOSIS — K562 Volvulus: Secondary | ICD-10-CM

## 2020-11-24 DIAGNOSIS — Z79899 Other long term (current) drug therapy: Secondary | ICD-10-CM | POA: Insufficient documentation

## 2020-11-24 HISTORY — PX: COLONOSCOPY WITH PROPOFOL: SHX5780

## 2020-11-24 SURGERY — COLONOSCOPY WITH PROPOFOL
Anesthesia: General

## 2020-11-24 MED ORDER — PROPOFOL 10 MG/ML IV BOLUS
INTRAVENOUS | Status: DC | PRN
  Administered 2020-11-24: 100 mg via INTRAVENOUS

## 2020-11-24 MED ORDER — PROPOFOL 500 MG/50ML IV EMUL
INTRAVENOUS | Status: DC | PRN
  Administered 2020-11-24: 150 ug/kg/min via INTRAVENOUS

## 2020-11-24 MED ORDER — DEXMEDETOMIDINE (PRECEDEX) IN NS 20 MCG/5ML (4 MCG/ML) IV SYRINGE
PREFILLED_SYRINGE | INTRAVENOUS | Status: DC | PRN
  Administered 2020-11-24: 8 ug via INTRAVENOUS
  Administered 2020-11-24: 12 ug via INTRAVENOUS

## 2020-11-24 MED ORDER — SODIUM CHLORIDE 0.9 % IV SOLN: INTRAVENOUS | Status: DC

## 2020-11-24 NOTE — Transfer of Care (Signed)
Immediate Anesthesia Transfer of Care Note  Patient: Henry Hill  Procedure(s) Performed: COLONOSCOPY WITH PROPOFOL  Patient Location: PACU and Endoscopy Unit  Anesthesia Type:General  Level of Consciousness: drowsy and patient cooperative  Airway & Oxygen Therapy: Patient Spontanous Breathing  Post-op Assessment: Report given to RN and Post -op Vital signs reviewed and stable  Post vital signs: Reviewed and stable  Last Vitals:  Vitals Value Taken Time  BP 116/83 11/24/20 0840  Temp    Pulse 77 11/24/20 0840  Resp 23 11/24/20 0840  SpO2 99 % 11/24/20 0840  Vitals shown include unvalidated device data.  Last Pain:  Vitals:   11/24/20 0727  TempSrc: Temporal  PainSc: 0-No pain         Complications: No notable events documented.

## 2020-11-24 NOTE — Op Note (Signed)
[facility] Gastroenterology Patient Name: Henry Hill Procedure Date: 11/24/2020 8:03 AM MRN: [id number] Account #: [id number] Date of Birth: August 05, 1960 Admit Type: Outpatient Age: 60 Room: [facility] ENDO ROOM 1 Gender: Male Note Status: Finalized Procedure:             Colonoscopy Indications:           Post surgical follow-up, h/o cecal volvulus Providers:             [name] MD, MD Referring MD:          Vickki Muff. Chrismon, MD (Referring MD) Medicines:             General Anesthesia Complications:         No immediate complications. Estimated blood loss:                         Minimal. Procedure:             Pre-Anesthesia Assessment:                        - Prior to the procedure, a History and Physical was                         performed, and patient medications and allergies were                         reviewed. The patient is competent. The risks and                         benefits of the procedure and the sedation options and                         risks were discussed with the patient. All questions                         were answered and informed consent was obtained.                         Patient identification and proposed procedure were                         verified by the physician, the nurse, the                         anesthesiologist, the anesthetist and the technician                         in the pre-procedure area in the procedure room in the                         endoscopy suite. Mental Status Examination: alert and                         oriented. Airway Examination: normal oropharyngeal                         airway and neck mobility. Respiratory Examination:  clear to auscultation. CV Examination: normal.                         Prophylactic Antibiotics: The patient does not require                         prophylactic antibiotics. Prior Anticoagulants: The                         patient  has taken Xarelto (rivaroxaban), last dose was                         5 days prior to procedure. ASA Grade Assessment: III -                         A patient with severe systemic disease. After                         reviewing the risks and benefits, the patient was                         deemed in satisfactory condition to undergo the                         procedure. The anesthesia plan was to use general                         anesthesia. Immediately prior to administration of                         medications, the patient was re-assessed for adequacy                         to receive sedatives. The heart rate, respiratory                         rate, oxygen saturations, blood pressure, adequacy of                         pulmonary ventilation, and response to care were                         monitored throughout the procedure. The physical                         status of the patient was re-assessed after the                         procedure.                        After obtaining informed consent, the colonoscope was                         passed under direct vision. Throughout the procedure,                         the patient's blood pressure, pulse, and oxygen  saturations were monitored continuously. The                         Colonoscope was introduced through the anus and                         advanced to the the ileocolonic anastomosis. The                         colonoscopy was performed without difficulty. The                         patient tolerated the procedure well. The quality of                         the bowel preparation was evaluated using the BBPS                         [facility] Bowel Preparation Scale) with scores of: Right                         Colon = 3, Transverse Colon = 3 and Left Colon = 3                         (entire mucosa seen well with no residual staining,                         small fragments of stool or  opaque liquid). The total                         BBPS score equals 9. Findings:      The perianal and digital rectal examinations were normal. Pertinent       negatives include normal sphincter tone and no palpable rectal lesions.      There was evidence of a prior end-to-side ileo-colonic anastomosis in       the ascending colon. This was patent and was characterized by healthy       appearing mucosa.      A 19 mm polyp was found in the recto-sigmoid colon. The polyp was       pedunculated. The polyp was removed with a hot snare. Resection and       retrieval were complete.      A 3 mm polyp was found in the ascending colon. The polyp was sessile.       The polyp was removed with a jumbo cold forceps. Resection and retrieval       were complete.      A 5 mm polyp was found in the descending colon. The polyp was sessile.       The polyp was removed with a cold snare. Resection and retrieval were       complete.      Five sessile polyps were found in the rectum and recto-sigmoid colon.       The polyps were 3 to 4 mm in size. These polyps were removed with a cold       snare. Resection and retrieval were complete.      The retroflexed view of the distal rectum and anal verge was normal and  showed no anal or rectal abnormalities. Impression:            - Patent end-to-side ileo-colonic anastomosis,                         characterized by healthy appearing mucosa.                        - One 19 mm polyp at the recto-sigmoid colon, removed                         with a hot snare. Resected and retrieved.                        - One 3 mm polyp in the ascending colon, removed with                         a jumbo cold forceps. Resected and retrieved.                        - One 5 mm polyp in the descending colon, removed with                         a cold snare. Resected and retrieved.                        - Five 3 to 4 mm polyps in the rectum and at the                          recto-sigmoid colon, removed with a cold snare.                         Resected and retrieved.                        - The distal rectum and anal verge are normal on                         retroflexion view. Recommendation:        - Discharge patient to home (with escort).                        - Resume previous diet today.                        - Resume Xarelto (rivaroxaban) in 2 days at prior                         dose. Refer to managing physician for further                         adjustment of therapy.                        - Await pathology results.                        - Repeat colonoscopy in 3 years for surveillance of  multiple polyps. Procedure Code(s):     --- Professional ---                        [phone], Colonoscopy, flexible; with removal of                         tumor(s), polyp(s), or other lesion(s) by snare                         technique                        02409, 40, Colonoscopy, flexible; with biopsy, single                         or multiple Diagnosis Code(s):     --- Professional ---                        Z98.0, Intestinal bypass and anastomosis status                        K63.5, Polyp of colon                        K62.1, Rectal polyp                        Z09, Encounter for follow-up examination after                         completed treatment for conditions other than                         malignant neoplasm CPT copyright 2019 American Medical Association. All rights reserved. The codes documented in this report are preliminary and upon coder review may  be revised to meet current compliance requirements. Dr. Ulyess Mort [name] MD, MD 11/24/2020 8:38:36 AM This report has been signed electronically. Number of Addenda: 0 Note Initiated On: 11/24/2020 8:03 AM Scope Withdrawal Time: 0 hours 14 minutes 46 seconds  Total Procedure Duration: 0 hours 17 minutes 14 seconds  Estimated Blood Loss:   Estimated blood loss was minimal.      [facility]

## 2020-11-24 NOTE — H&P (Signed)
Cephas Darby, MD [address]  [place]  [place], [place] [address]  Main: [phone]  Fax: [phone] Pager: [phone]  Primary Care Physician:  Margo Common, PA-C Primary Gastroenterologist:  Dr. Cephas Darby  Pre-Procedure History & Physical: HPI:  Henry Hill is a 59 y.o. male is here for an colonoscopy.   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 08/2020 s/p ex lap & R colectomy.   Diastolic dysfunction    a 08/2020 Echo: EF 50-55%, no rwma, Gr1 DD, nl RV size/fxn, triv MR.   Essential hypertension    ETOH abuse    Persistent atrial fibrillation ([place])    a. Dx 08/2020 in setting of cecal volvulus s/p R colectomy.  CHA2DS@VASc  = 1.   Tobacco abuse    Varicose veins of both lower extremities     Past Surgical History:  Procedure Laterality Date   BACK SURGERY  05/10/2001   HEMORRHOID SURGERY N/A 11/13/2014   Procedure: LEFT LATERAL HEMORRHOIDECTOMY  ;  Surgeon: [name], MD;  Location: [facility] ORS;  Service: General;  Laterality: N/A;   JOINT REPLACEMENT     LAPAROTOMY Right 08/30/2020   Procedure: EXPLORATORY LAPAROTOMY WITH RIGHT COLOCTOMY;  Surgeon: [name], MD;  Location: [facility] ORS;  Service: General;  Laterality: Right;    Prior to Admission medications   Medication Sig Start Date End Date Taking? Authorizing Provider  atorvastatin (LIPITOR) 10 MG tablet Take 1 tablet (10 mg total) by mouth daily. 11/13/20 02/11/21 Yes [name], MD  metoprolol tartrate (LOPRESSOR) 50 MG tablet TAKE 1.5 TABLETS BY MOUTH 2 TIMES DAILY. 11/18/20  Yes Theora Gianotti, NP  Multiple Vitamins-Minerals (MULTIVITAMIN ADULT PO) Take 1 tablet by mouth daily.   Yes [provider]  rivaroxaban (XARELTO) 20 MG TABS tablet Take 1 tablet (20 mg total) by mouth daily with supper. 10/08/20   Minna Merritts, MD  sildenafil (VIAGRA) 50 MG tablet TAKE 1 TABLET BY MOUTH DAILY AS NEEDED FOR ERECTILE DYSFUNCTION 07/31/20    Chrismon, Vickki Muff, PA-C    Allergies as of 10/29/2020   (No Known Allergies)    Family History  Problem Relation Age of Onset   Heart attack Mother    Diabetes Father    Hypertension Father     Social History   Socioeconomic History   Marital status: Married    Spouse name: Not on file   Number of children: Not on file   Years of education: Not on file   Highest education level: Not on file  Occupational History   Not on file  Tobacco Use   Smoking status: Every Day    Packs/day: 0.50    Types: Cigarettes   Smokeless tobacco: Never  Vaping Use   Vaping Use: Never used  Substance and Sexual Activity   Alcohol use: Yes    Alcohol/week: 20.0 standard drinks    Types: 20 Cans of beer per week    Comment: MODERATE USE   Drug use: No   Sexual activity: Not on file  Other Topics Concern   Not on file  Social History Narrative   Not on file   Social Determinants of Health   Financial Resource Strain: Not on file  Food Insecurity: Not on file  Transportation Needs: Not on file  Physical Activity: Not on file  Stress: Not on file  Social Connections: Not on file  Intimate Partner Violence: Not on file  Review of Systems: See HPI, otherwise negative ROS  Physical Exam: BP (!) 155/108   Pulse 92   Temp (!) 97.2 F (36.2 C) (Temporal)   Resp 17   Ht 6' (1.829 m)   Wt 81.6 kg   SpO2 99%   BMI 24.41 kg/m  General:   Alert,  pleasant and cooperative in NAD Head:  Normocephalic and atraumatic. Neck:  Supple; no masses or thyromegaly. Lungs:  Clear throughout to auscultation.    Heart:  Regular rate and rhythm. Abdomen:  Soft, nontender and nondistended. Normal bowel sounds, without guarding, and without rebound.   Neurologic:  Alert and  oriented x4;  grossly normal neurologically.  Impression/Plan: Henry Hill is here for an colonoscopy to be performed for h/o cecal volvulus  Risks, benefits, limitations, and alternatives regarding  colonoscopy have  been reviewed with the patient.  Questions have been answered.  All parties agreeable.   Sherri Sear, MD  11/24/2020, 8:39 AM

## 2020-11-24 NOTE — Anesthesia Postprocedure Evaluation (Signed)
Anesthesia Post Note  Patient: Henry Hill  Procedure(s) Performed: COLONOSCOPY WITH PROPOFOL  Patient location during evaluation: PACU Anesthesia Type: General Level of consciousness: awake and alert Pain management: pain level controlled Vital Signs Assessment: post-procedure vital signs reviewed and stable Respiratory status: spontaneous breathing, nonlabored ventilation, respiratory function stable and patient connected to nasal cannula oxygen Cardiovascular status: blood pressure returned to baseline and stable Postop Assessment: no apparent nausea or vomiting Anesthetic complications: no   No notable events documented.   Last Vitals:  Vitals:   11/24/20 0900 11/24/20 0910  BP: (!) 156/90 (!) 164/90  Pulse: 61 (!) 58  Resp: 15 16  Temp:    SpO2: 99% 100%    Last Pain:  Vitals:   11/24/20 0910  TempSrc:   PainSc: 0-No pain                 Iran Ouch

## 2020-11-24 NOTE — Anesthesia Preprocedure Evaluation (Addendum)
Anesthesia Evaluation  Patient identified by MRN, date of birth, ID band Patient awake    Reviewed: Allergy & Precautions, H&P , NPO status , Patient's Chart, lab work & pertinent test results  Airway Mallampati: II  TM Distance: >3 FB Neck ROM: Full    Dental no notable dental hx. (+) Teeth Intact, Dental Advisory Given   Pulmonary Current SmokerPatient did not abstain from smoking.,    Pulmonary exam normal breath sounds clear to auscultation       Cardiovascular hypertension, Pt. on medications + Peripheral Vascular Disease   Rhythm:Regular Rate:Normal  Varicose veins   Neuro/Psych PSYCHIATRIC DISORDERS Anxiety negative neurological ROS     GI/Hepatic Neg liver ROS, Bowel prep,GI obstruction   Endo/Other  negative endocrine ROS  Renal/GU negative Renal ROS  negative genitourinary   Musculoskeletal  (+) Arthritis , Osteoarthritis,    Abdominal   Peds negative pediatric ROS (+)  Hematology negative hematology ROS (+)   Anesthesia Other Findings Past Medical History: No date: Essential hypertension No date: Varicose veins of both lower extremities  Reproductive/Obstetrics negative OB ROS                             Anesthesia Physical  Anesthesia Plan  ASA: 3  Anesthesia Plan: General   Post-op Pain Management:    Induction: Intravenous  PONV Risk Score and Plan: 1 and Propofol infusion  Airway Management Planned: Natural Airway and Nasal Cannula  Additional Equipment:   Intra-op Plan:   Post-operative Plan:   Informed Consent: I have reviewed the patients History and Physical, chart, labs and discussed the procedure including the risks, benefits and alternatives for the proposed anesthesia with the patient or authorized representative who has indicated his/her understanding and acceptance.     Dental advisory given  Plan Discussed with: CRNA  Anesthesia Plan  Comments:         Anesthesia Quick Evaluation

## 2020-11-25 ENCOUNTER — Encounter: Payer: Self-pay | Admitting: Gastroenterology

## 2020-11-25 ENCOUNTER — Ambulatory Visit: Payer: 59 | Admitting: Family Medicine

## 2020-11-25 LAB — SURGICAL PATHOLOGY

## 2020-11-26 ENCOUNTER — Encounter: Payer: Self-pay | Admitting: Gastroenterology

## 2020-12-02 ENCOUNTER — Other Ambulatory Visit: Payer: Self-pay | Admitting: *Deleted

## 2020-12-02 DIAGNOSIS — I739 Peripheral vascular disease, unspecified: Secondary | ICD-10-CM

## 2020-12-02 DIAGNOSIS — I480 Paroxysmal atrial fibrillation: Secondary | ICD-10-CM

## 2020-12-03 ENCOUNTER — Other Ambulatory Visit
Admission: RE | Admit: 2020-12-03 | Discharge: 2020-12-03 | Disposition: A | Discharge status: Home / Self Care | Payer: 59 | Attending: Cardiovascular Disease | Admitting: Cardiovascular Disease

## 2020-12-03 DIAGNOSIS — I739 Peripheral vascular disease, unspecified: Secondary | ICD-10-CM | POA: Insufficient documentation

## 2020-12-03 DIAGNOSIS — I480 Paroxysmal atrial fibrillation: Secondary | ICD-10-CM | POA: Diagnosis not present

## 2020-12-03 LAB — BASIC METABOLIC PANEL
Anion gap: 6 (ref 5–15)
BUN: 13 mg/dL (ref 6–20)
CO2: 24 mmol/L (ref 22–32)
Calcium: 8.9 mg/dL (ref 8.9–10.3)
Chloride: 98 mmol/L (ref 98–111)
Creatinine, Ser: 0.72 mg/dL (ref 0.61–1.24)
GFR, Estimated: >60 mL/min (ref >60)
Glucose, Bld: 93 mg/dL (ref 70–99)
Potassium: 4.1 mmol/L (ref 3.5–5.1)
Sodium: 128 mmol/L — ABNORMAL LOW (ref 135–145)

## 2020-12-03 LAB — CBC
HCT: 38 % — ABNORMAL LOW (ref 39.0–52.0)
Hemoglobin: 13.3 g/dL (ref 13.0–17.0)
MCH: 33.5 pg (ref 26.0–34.0)
MCHC: 35 g/dL (ref 30.0–36.0)
MCV: 95.7 fL (ref 80.0–100.0)
Platelets: 253 10*3/uL (ref 150–400)
RBC: 3.97 MIL/uL — ABNORMAL LOW (ref 4.22–5.81)
RDW: 13.7 % (ref 11.5–15.5)
WBC: 10.9 10*3/uL — ABNORMAL HIGH (ref 4.0–10.5)
nRBC: 0 % (ref 0.0–0.2)

## 2020-12-08 ENCOUNTER — Telehealth: Payer: Self-pay | Admitting: *Deleted

## 2020-12-08 NOTE — Telephone Encounter (Signed)
Abdominal aortogram scheduled at [facility] for: Wednesday December 10, 2020 [facility] Main Entrance [facility]) at: 8 AM   No solid food after midnight prior to cath, clear liquids until 5 AM day of procedure.  Hold: Xarelto-none 12/08/20 until post procedure  Except hold medications AM meds can be  taken pre-cath with sips of water including:   aspirin 81 mg   Confirmed patient has responsible adult to drive home post procedure and be with patient first 24 hours after arriving home.  Patients are allowed one visitor in the waiting room during the time they are at the hospital for their procedure. Both patient and visitor must wear a mask once they enter the hospital.   Patient reports does not currently have any symptoms concerning for COVID-19 and no household members with COVID-19 like illness.      Reviewed procedure/mask/visitor instructions with patient.

## 2020-12-09 ENCOUNTER — Ambulatory Visit: Payer: 59

## 2020-12-09 DIAGNOSIS — I739 Peripheral vascular disease, unspecified: Secondary | ICD-10-CM

## 2020-12-09 DIAGNOSIS — I10 Essential (primary) hypertension: Secondary | ICD-10-CM

## 2020-12-09 DIAGNOSIS — R0683 Snoring: Secondary | ICD-10-CM

## 2020-12-09 DIAGNOSIS — Z720 Tobacco use: Secondary | ICD-10-CM

## 2020-12-09 DIAGNOSIS — I480 Paroxysmal atrial fibrillation: Secondary | ICD-10-CM

## 2020-12-09 DIAGNOSIS — F1010 Alcohol abuse, uncomplicated: Secondary | ICD-10-CM

## 2020-12-09 NOTE — Procedures (Signed)
   Sleep Study Report  Patient Information Study Date: Sep 29, 2020 Patient Name: Henry Hill Patient ID: [id number] Birth Date: May 21, 1961 Age: 60 Gender: Male BMI: 23.6 (W=174 lb, H=6' 0'') Referring Physician: Ignacia Bayley, NP  TEST DESCRIPTION: Home sleep apnea testing was completed using the WatchPat, a Type 1 device, utilizing peripheral arterial tonometry (PAT), chest movement, actigraphy, pulse oximetry, pulse rate, body position and snore. AHI was calculated with apnea and hypopnea using valid sleep time as the denominator. RDI includes apneas, hypopneas, and RERAs. The data acquired and the scoring of sleep and all associated events were performed in accordance with the recommended standards and specifications as outlined in the AASM Manual for the Scoring of Sleep and Associated Events 2.2.0 (2015).  FINDINGS: 1. Severe Obstructive Sleep Apnea with AHI 30.9/hr. 2. No Central Sleep Apnea with pAHIc 0/hr. 3. Oxygen desaturations as low as 86%. 4. Mild snoring was present. O2 sats were < 88% for 1.5 min. 5. Total sleep time was 2 hrs and 25 min. 6. 0% of total sleep time was spent in REM sleep. 7. Short sleep onset latency at 5 min. 8. No REM sleep was present. 9. Total awakenings were 5.  DIAGNOSIS: Severe Obstructive Sleep Apnea (G47.33  RECOMMENDATIONS:  1. Clinical correlation of these findings is necessary. The decision to treat obstructive sleep apnea (OSA) is usually based on the presence of apnea symptoms or the presence of associated medical conditions such as Hypertension, Congestive Heart Failure, Atrial Fibrillation or Obesity. The most common symptoms of OSA are snoring, gasping for breath while sleeping, daytime sleepiness and fatigue.  2. Initiating apnea therapy is recommended given the presence of symptoms and/or associated conditions. Recommend proceeding with one of the following:   a. Auto-CPAP therapy with a pressure range of 5-20cm H2O.    b. An oral appliance (OA) that can be obtained from certain dentists with expertise in sleep medicine. These are primarily of use in non-obese patients with mild and moderate disease.   c. An ENT consultation which may be useful to look for specific causes of obstruction and possible treatment options.   d. If patient is intolerant to PAP therapy, consider referral to ENT for evaluation for hypoglossal nerve stimulator.  3. Close follow-up is necessary to ensure success with CPAP or oral appliance therapy for maximum benefit .  4. A follow-up oximetry study on CPAP is recommended to assess the adequacy of therapy and determine the need for supplemental oxygen or the potential need for Bi-level therapy. An arterial blood gas to determine the adequacy of baseline ventilation and oxygenation should also be considered.  5. Healthy sleep recommendations include: adequate nightly sleep (normal 7-9 hrs/night), avoidance of caffeine after noon and alcohol near bedtime, and maintaining a sleep environment that is cool, dark and quiet.  6. Weight loss for overweight patients is recommended. Even modest amounts of weight loss can significantly improve the severity of sleep apnea.  7. Snoring recommendations include: weight loss where appropriate, side sleeping, and avoidance of alcohol before bed.  8. Operation of motor vehicle or dangerous equipment must be avoided when feeling drowsy, excessively sleepy, or mentally fatigued.  Signature: Electronically Signed: Dec 09, 2020 Fransico Him, MD; [facility]; [place], [place] Board of Sleep Medicine Report prepared by: Fransico Him, MD

## 2020-12-10 ENCOUNTER — Other Ambulatory Visit: Payer: Self-pay

## 2020-12-10 ENCOUNTER — Encounter (HOSPITAL_COMMUNITY)
Admission: RE | Disposition: A | Discharge status: Home / Self Care | Payer: 59 | Source: Ambulatory Visit | Attending: Cardiovascular Disease

## 2020-12-10 ENCOUNTER — Encounter (HOSPITAL_COMMUNITY): Payer: Self-pay | Admitting: Cardiovascular Disease

## 2020-12-10 ENCOUNTER — Ambulatory Visit (HOSPITAL_COMMUNITY)
Admission: RE | Admit: 2020-12-10 | Discharge: 2020-12-10 | Disposition: A | Discharge status: Home / Self Care | Payer: 59 | Source: Ambulatory Visit | Attending: Cardiovascular Disease | Admitting: Cardiovascular Disease

## 2020-12-10 DIAGNOSIS — F17210 Nicotine dependence, cigarettes, uncomplicated: Secondary | ICD-10-CM | POA: Diagnosis not present

## 2020-12-10 DIAGNOSIS — I70213 Atherosclerosis of native arteries of extremities with intermittent claudication, bilateral legs: Secondary | ICD-10-CM

## 2020-12-10 DIAGNOSIS — I10 Essential (primary) hypertension: Secondary | ICD-10-CM | POA: Diagnosis not present

## 2020-12-10 DIAGNOSIS — I480 Paroxysmal atrial fibrillation: Secondary | ICD-10-CM | POA: Diagnosis not present

## 2020-12-10 DIAGNOSIS — Z79899 Other long term (current) drug therapy: Secondary | ICD-10-CM | POA: Diagnosis not present

## 2020-12-10 DIAGNOSIS — Z8249 Family history of ischemic heart disease and other diseases of the circulatory system: Secondary | ICD-10-CM | POA: Diagnosis not present

## 2020-12-10 DIAGNOSIS — Z7901 Long term (current) use of anticoagulants: Secondary | ICD-10-CM | POA: Insufficient documentation

## 2020-12-10 DIAGNOSIS — I739 Peripheral vascular disease, unspecified: Secondary | ICD-10-CM

## 2020-12-10 HISTORY — PX: ABDOMINAL AORTOGRAM W/LOWER EXTREMITY: CATH118223

## 2020-12-10 SURGERY — ABDOMINAL AORTOGRAM W/LOWER EXTREMITY
Anesthesia: LOCAL

## 2020-12-10 MED ORDER — ONDANSETRON HCL 4 MG/2ML IJ SOLN: 4.0000 mg | Freq: Four times a day (QID) | INTRAMUSCULAR | Status: DC | PRN

## 2020-12-10 MED ORDER — MIDAZOLAM HCL 2 MG/2ML IJ SOLN
INTRAMUSCULAR | Status: AC
  Filled 2020-12-10: qty 2

## 2020-12-10 MED ORDER — MIDAZOLAM HCL 2 MG/2ML IJ SOLN
INTRAMUSCULAR | Status: DC | PRN
  Administered 2020-12-10: 1 mg via INTRAVENOUS

## 2020-12-10 MED ORDER — SODIUM CHLORIDE 0.9 % IV SOLN: 250.0000 mL | INTRAVENOUS | Status: DC | PRN

## 2020-12-10 MED ORDER — SODIUM CHLORIDE 0.9 % WEIGHT BASED INFUSION
3.0000 mL/kg/h | INTRAVENOUS | Status: AC
  Administered 2020-12-10: 3 mL/kg/h via INTRAVENOUS

## 2020-12-10 MED ORDER — ACETAMINOPHEN 325 MG PO TABS: 650.0000 mg | ORAL_TABLET | ORAL | Status: DC | PRN

## 2020-12-10 MED ORDER — ASPIRIN 81 MG PO CHEW: 81.0000 mg | CHEWABLE_TABLET | ORAL | Status: DC

## 2020-12-10 MED ORDER — SODIUM CHLORIDE 0.9% FLUSH: 3.0000 mL | INTRAVENOUS | Status: DC | PRN

## 2020-12-10 MED ORDER — LIDOCAINE HCL (PF) 1 % IJ SOLN
INTRAMUSCULAR | Status: AC
  Filled 2020-12-10: qty 30

## 2020-12-10 MED ORDER — HEPARIN (PORCINE) IN NACL 1000-0.9 UT/500ML-% IV SOLN
INTRAVENOUS | Status: AC
  Filled 2020-12-10: qty 500

## 2020-12-10 MED ORDER — HEPARIN (PORCINE) IN NACL 1000-0.9 UT/500ML-% IV SOLN
INTRAVENOUS | Status: DC | PRN
  Administered 2020-12-10: 500 mL

## 2020-12-10 MED ORDER — SODIUM CHLORIDE 0.9 % IV SOLN: INTRAVENOUS | Status: AC

## 2020-12-10 MED ORDER — FENTANYL CITRATE (PF) 100 MCG/2ML IJ SOLN
INTRAMUSCULAR | Status: AC
  Filled 2020-12-10: qty 2

## 2020-12-10 MED ORDER — IODIXANOL 320 MG/ML IV SOLN
INTRAVENOUS | Status: DC | PRN
  Administered 2020-12-10: 105 mL

## 2020-12-10 MED ORDER — LIDOCAINE HCL (PF) 1 % IJ SOLN
INTRAMUSCULAR | Status: DC | PRN
  Administered 2020-12-10: 12 mL
  Administered 2020-12-10: 15 mL

## 2020-12-10 MED ORDER — SODIUM CHLORIDE 0.9% FLUSH: 3.0000 mL | Freq: Two times a day (BID) | INTRAVENOUS | Status: DC

## 2020-12-10 MED ORDER — SODIUM CHLORIDE 0.9 % WEIGHT BASED INFUSION: 1.0000 mL/kg/h | INTRAVENOUS | Status: DC

## 2020-12-10 MED ORDER — FENTANYL CITRATE (PF) 100 MCG/2ML IJ SOLN
INTRAMUSCULAR | Status: DC | PRN
  Administered 2020-12-10: 50 ug via INTRAVENOUS

## 2020-12-10 SURGICAL SUPPLY — 12 items
CATH ANGIO 5F PIGTAIL 65CM (CATHETERS) ×1 IMPLANT
CATH CROSS OVER TEMPO 5F (CATHETERS) ×1 IMPLANT
CATH STRAIGHT 5FR 65CM (CATHETERS) ×1 IMPLANT
KIT MICROPUNCTURE NIT STIFF (SHEATH) ×1 IMPLANT
KIT PV (KITS) ×2 IMPLANT
SHEATH PINNACLE 5F 10CM (SHEATH) ×1 IMPLANT
SHEATH PROBE COVER 6X72 (BAG) ×1 IMPLANT
SYR MEDRAD MARK 7 150ML (SYRINGE) ×2 IMPLANT
TRANSDUCER W/STOPCOCK (MISCELLANEOUS) ×2 IMPLANT
TRAY PV CATH (CUSTOM PROCEDURE TRAY) ×2 IMPLANT
WIRE HITORQ VERSACORE ST 145CM (WIRE) ×1 IMPLANT
WIRE TORQFLEX AUST .018X40CM (WIRE) ×1 IMPLANT

## 2020-12-10 NOTE — Progress Notes (Signed)
Site area: Left  groin a 5 french arterial sheath was removed  Site Prior to Removal:  Level 0  Pressure Applied For 20 MINUTES    Bedrest Beginning at 1120am  Manual:   Yes.    Patient Status During Pull:  stable  Post Pull Groin Site:  Level 0  Post Pull Instructions Given:  Yes.    Post Pull Pulses Present:  Yes.    Dressing Applied:  Yes.    Comments:

## 2020-12-10 NOTE — Interval H&P Note (Signed)
History and Physical Interval Note:  12/10/2020 9:41 AM  Henry Hill  has presented today for surgery, with the diagnosis of PAD.  The various methods of treatment have been discussed with the patient and family. After consideration of risks, benefits and other options for treatment, the patient has consented to  Procedure(s): ABDOMINAL AORTOGRAM W/LOWER EXTREMITY (N/A) as a surgical intervention.  The patient's history has been reviewed, patient examined, no change in status, stable for surgery.  I have reviewed the patient's chart and labs.  Questions were answered to the patient's satisfaction.     [name]

## 2020-12-25 ENCOUNTER — Other Ambulatory Visit: Payer: Self-pay

## 2020-12-25 ENCOUNTER — Encounter: Payer: Self-pay | Admitting: Cardiovascular Disease

## 2020-12-25 ENCOUNTER — Ambulatory Visit (INDEPENDENT_AMBULATORY_CARE_PROVIDER_SITE_OTHER): Payer: 59 | Admitting: Cardiovascular Disease

## 2020-12-25 VITALS — BP 160/90 | HR 74 | Ht 72.0 in | Wt 183.5 lb

## 2020-12-25 DIAGNOSIS — Z720 Tobacco use: Secondary | ICD-10-CM

## 2020-12-25 DIAGNOSIS — I480 Paroxysmal atrial fibrillation: Secondary | ICD-10-CM

## 2020-12-25 DIAGNOSIS — I739 Peripheral vascular disease, unspecified: Secondary | ICD-10-CM

## 2020-12-25 NOTE — Patient Instructions (Signed)
Medication Instructions:  Your physician recommends that you continue on your current medications as directed. Please refer to the Current Medication list given to you today.  *If you need a refill on your cardiac medications before your next appointment, please call your pharmacy*   Lab Work: None ordered  If you have labs (blood work) drawn today and your tests are completely normal, you will receive your results only by: [place] (if you have MyChart) OR A paper copy in the mail If you have any lab test that is abnormal or we need to change your treatment, we will call you to review the results.   Testing/Procedures: None ordered   Follow-Up: At [facility], you and your health needs are our priority.  As part of our continuing mission to provide you with exceptional heart care, we have created designated Provider Care Teams.  These Care Teams include your primary Cardiologist (physician) and Advanced Practice Providers (APPs -  Physician Assistants and Nurse Practitioners) who all work together to provide you with the care you need, when you need it.  We recommend signing up for the patient portal called "MyChart".  Sign up information is provided on this After Visit Summary.  MyChart is used to connect with patients for Virtual Visits (Telemedicine).  Patients are able to view lab/test results, encounter notes, upcoming appointments, etc.  Non-urgent messages can be sent to your provider as well.   To learn more about what you can do with MyChart, go to [email or web address].    Your next appointment:   4 month(s)  The format for your next appointment:   In Person  Provider:   [name], MD   Other Instructions You have been referred to [facility]. Their office will contact you to schedule the appointment.

## 2020-12-25 NOTE — Progress Notes (Signed)
Cardiology Office Note   Date:  12/25/2020   ID:  Henry Hill, DOB 23-Jan-1961, MRN [id number]  PCP:  Margo Common, PA-C  Cardiologist:  Dr. Rockey Situ  Chief Complaint  Patient presents with   Other    6 wk f/u no complaints today. Meds reviewed verbally with pt.      History of Present Illness: Henry Hill is a 60 y.o. male who is here today for follow-up visit regarding peripheral arterial disease.    He has known history of paroxysmal atrial fibrillation, essential hypertension, tobacco use, alcohol use, probable sleep apnea and varicose veins.  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  He had atrial fibrillation with RVR in that setting.  He converted sinus rhythm with metoprolol and diltiazem.  Echocardiogram showed an EF of 50 to 55%.  He was anticoagulated with Eliquis.  He had a right colectomy done with improvement.  He had subsequent Lexiscan Myoview in June which showed no clear evidence of ischemia although the stress test was suboptimal.   He was seen recently for bilateral thigh and calf claudication after walking about 1 block.  The symptoms have been present for about 2 to 3 years.  Most recently, he developed a large ulceration on the medial side of the left ankle.  He has been going to the wound clinic and this has improved significantly and almost healed completely.  He smokes half a pack per day and has been smoking for a long time.  He had recent lower extremity arterial Doppler which showed an ABI of 0.67 bilaterally.  Duplex showed significant calcified right common femoral artery stenosis with possible short occlusion in the right distal SFA.  On the left, there was severe stenosis in the distal common femoral artery with an occluded popliteal artery. Angiography was performed on August 3 which showed no significant aortoiliac disease.  On the right side, there was severe heavily calcified subtotal occlusion of the common  femoral artery extending into the ostium of both SFA and profunda with occluded distal SFA and three-vessel runoff below the knee.  On the left side, there was heavily calcified left common femoral artery stenosis extending into the ostium of the SFA with short occlusion of the proximal to mid SFA, occluded popliteal artery and three-vessel runoff below the knee.  I recommended bilateral common femoral artery endarterectomy. He has been doing reasonably well and reports stable severe bilateral calf claudication.  No rest pain or lower extremity ulceration.  No chest pain or worsening dyspnea.   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 08/2020 s/p ex lap & R colectomy.   Diastolic dysfunction    a 08/2020 Echo: EF 50-55%, no rwma, Gr1 DD, nl RV size/fxn, triv MR.   Essential hypertension    ETOH abuse    Persistent atrial fibrillation ([place])    a. Dx 08/2020 in setting of cecal volvulus s/p R colectomy.  CHA2DS'@VASc'$  = 1.   Tobacco abuse    Varicose veins of both lower extremities     Past Surgical History:  Procedure Laterality Date   ABDOMINAL AORTOGRAM W/LOWER EXTREMITY N/A 12/10/2020   Procedure: ABDOMINAL AORTOGRAM W/LOWER EXTREMITY;  Surgeon: [name], MD;  Location: [facility];  Service: Cardiovascular;  Laterality: N/A;   BACK SURGERY  05/10/2001   COLONOSCOPY WITH PROPOFOL N/A 11/24/2020   Procedure: COLONOSCOPY WITH PROPOFOL;  Surgeon: Sherri Sear  Reece Levy, MD;  Location: [facility];  Service: Gastroenterology;  Laterality: N/A;   HEMORRHOID SURGERY N/A 11/13/2014   Procedure: LEFT LATERAL HEMORRHOIDECTOMY  ;  Surgeon: [name], MD;  Location: [facility];  Service: General;  Laterality: N/A;   JOINT REPLACEMENT     LAPAROTOMY Right 08/30/2020   Procedure: EXPLORATORY LAPAROTOMY WITH RIGHT COLOCTOMY;  Surgeon: [name], MD;  Location: [facility];  Service: General;  Laterality: Right;     Current Outpatient Medications  Medication  Sig Dispense Refill   atorvastatin (LIPITOR) 10 MG tablet Take 1 tablet (10 mg total) by mouth daily. 30 tablet 5   metoprolol tartrate (LOPRESSOR) 50 MG tablet TAKE 1.5 TABLETS BY MOUTH 2 TIMES DAILY. 90 tablet 3   Misc Natural Products (GLUCOSAMINE CHOND MSM FORMULA PO) Take 1 tablet by mouth daily.     Multiple Vitamins-Minerals (MULTIVITAMIN ADULT PO) Take 1 tablet by mouth daily.     rivaroxaban (XARELTO) 20 MG TABS tablet Take 1 tablet (20 mg total) by mouth daily with supper. 30 tablet 11   No current facility-administered medications for this visit.    Allergies:   Patient has no known allergies.    Social History:  The patient  reports that he has been smoking cigarettes. He has been smoking an average of .5 packs per day. He has never used smokeless tobacco. He reports current alcohol use of about 20.0 standard drinks per week. He reports that he does not use drugs.   Family History:  The patient's family history includes Diabetes in his father; Heart attack in his mother; Hypertension in his father.    ROS:  Please see the history of present illness.   Otherwise, review of systems are positive for none.   All other systems are reviewed and negative.    PHYSICAL EXAM: VS:  BP (!) 160/90 (BP Location: Left Arm, Patient Position: Sitting, Cuff Size: Normal)   Pulse 74   Ht 6' (1.829 m)   Wt 183 lb 8 oz (83.2 kg)   SpO2 97%   BMI 24.89 kg/m  , BMI Body mass index is 24.89 kg/m. GEN: Well nourished, well developed, in no acute distress  HEENT: normal  Neck: no JVD, carotid bruits, or masses Cardiac: RRR; no murmurs, rubs, or gallops,no edema  Respiratory:  clear to auscultation bilaterally, normal work of breathing GI: soft, nontender, nondistended, + BS MS: no deformity or atrophy  Skin: warm and dry, no rash Neuro:  Strength and sensation are intact Psych: euthymic mood, full affect No groin hematoma   EKG:  EKG is not ordered today.    Recent Labs: 08/30/2020:  TSH 0.864 09/01/2020: ALT 28 09/03/2020: B Natriuretic Peptide 436.6 09/25/2020: Magnesium 2.0 12/03/2020: BUN 13; Creatinine, Ser 0.72; Hemoglobin 13.3; Platelets 253; Potassium 4.1; Sodium 128    Lipid Panel    Component Value Date/Time   CHOL 151 07/03/2019 0816   TRIG 48 09/01/2020 0619   HDL 100 07/03/2019 0816   CHOLHDL 1.5 07/03/2019 0816   LDLCALC 42 07/03/2019 0816      Wt Readings from Last 3 Encounters:  12/25/20 183 lb 8 oz (83.2 kg)  12/10/20 182 lb (82.6 kg)  11/24/20 180 lb (81.6 kg)       PAD Screen 11/13/2020  Previous PAD dx? No  Previous surgical procedure? Yes  Dates of procedures knee replacement  Pain with walking? Yes  Subsides with rest? No  Feet/toe relief with dangling? No  Painful, non-healing ulcers? Yes  Extremities  discolored? Yes      ASSESSMENT AND PLAN:  1.  Peripheral arterial disease with severe bilateral leg claudication worse on the left than the right.  His venous ulceration healed completely.  Given severe lifestyle limiting claudication, I recommend bilateral common femoral artery endarterectomy.  The patient does have SFA and popliteal disease but has good collaterals and that can likely be treated medically for now.  I think most of the benefit will be from common femoral artery endarterectomy. Xarelto can be held 2 days before surgery if needed. The patient had negative cardiac work-up in the last few months and should be considered at low risk from a cardiac standpoint. I referred the patient to [facility].  2.  Paroxysmal atrial fibrillation: Seems to be maintaining in sinus rhythm.  He is on long-term anticoagulation with Xarelto with a CHA2DS2-VASc score of 2.    3.  Hyperlipidemia: The patient was started on small dose atorvastatin given peripheral arterial disease.  4.  Tobacco use: I discussed with him the importance of smoking cessation.  He cut down but has not been able to quit smoking completely.  5.  Essential  hypertension: Blood pressure is mildly elevated but he just took his metoprolol before he came in.  Continue to monitor for now and can consider adding additional antihypertensive medications if needed.   Disposition:   FU with me in 4 months.  Signed,  [name], MD  12/25/2020 8:20 AM    [facility]

## 2020-12-26 ENCOUNTER — Telehealth: Payer: Self-pay | Admitting: *Deleted

## 2020-12-26 DIAGNOSIS — G4733 Obstructive sleep apnea (adult) (pediatric): Secondary | ICD-10-CM

## 2020-12-26 NOTE — Telephone Encounter (Signed)
-----   Message from Sueanne Margarita, MD sent at 12/09/2020  4:15 PM EDT ----- Please let patient know that they have sleep apnea and recommend treating with CPAP.  Please order an auto CPAP from 4-15cm H2O with heated humidity and mask of choice.  Order overnight pulse ox on CPAP.  Followup with me in 6 weeks.

## 2020-12-26 NOTE — Telephone Encounter (Signed)
The patient has been notified of the result and verbalized understanding.  All questions (if any) were answered. Marolyn Hammock, [place] 12/26/2020 9:40 AM    Upon patient request DME selection is [facility] Patient understands he will be contacted by [facility] to set up his cpap. Patient understands to call if [facility] does not contact him with new setup in a timely manner. Patient understands they will be called once confirmation has been received from [facility] that they have received their new machine to schedule 10 week follow up appointment. [place] notified of new cpap order  Please add to airview Patient was grateful for the call and thanked me

## 2020-12-30 ENCOUNTER — Ambulatory Visit (INDEPENDENT_AMBULATORY_CARE_PROVIDER_SITE_OTHER): Payer: 59 | Admitting: Vascular Surgery

## 2020-12-30 ENCOUNTER — Encounter (INDEPENDENT_AMBULATORY_CARE_PROVIDER_SITE_OTHER): Payer: Self-pay | Admitting: Vascular Surgery

## 2020-12-30 ENCOUNTER — Other Ambulatory Visit: Payer: Self-pay

## 2020-12-30 VITALS — BP 172/89 | HR 75 | Resp 16 | Ht 72.0 in | Wt 187.4 lb

## 2020-12-30 DIAGNOSIS — I10 Essential (primary) hypertension: Secondary | ICD-10-CM

## 2020-12-30 DIAGNOSIS — I4891 Unspecified atrial fibrillation: Secondary | ICD-10-CM | POA: Diagnosis not present

## 2020-12-30 DIAGNOSIS — I83813 Varicose veins of bilateral lower extremities with pain: Secondary | ICD-10-CM

## 2020-12-30 DIAGNOSIS — I70213 Atherosclerosis of native arteries of extremities with intermittent claudication, bilateral legs: Secondary | ICD-10-CM

## 2020-12-30 DIAGNOSIS — I70219 Atherosclerosis of native arteries of extremities with intermittent claudication, unspecified extremity: Secondary | ICD-10-CM | POA: Insufficient documentation

## 2020-12-30 NOTE — Assessment & Plan Note (Signed)
The patient has severe common femoral, profunda femoris, and superficial femoral artery disease bilaterally as well as some SFA and popliteal disease on both sides.  He does have disabling claudication symptoms and is otherwise reasonably young and continues to work.  I have discussed the situation with the patient.  Femoral endarterectomies would be planned to improve his perfusion.  We could address his infrainguinal disease concomitantly, but with a nonlimb threatening situation this could also be addressed in the future if need be.  I have discussed the typical recovery, the typical hospital course, and the risks and benefits.  The patient is agreeable to proceed with bilateral femoral endarterectomies

## 2020-12-30 NOTE — Assessment & Plan Note (Signed)
On anticoagulation and now rate controlled

## 2020-12-30 NOTE — Assessment & Plan Note (Signed)
Status posttreatment several years ago and now doing well from this.

## 2020-12-30 NOTE — Progress Notes (Signed)
Patient ID: WINTON PASION, male   DOB: 1961-02-22, 60 y.o.   MRN: [id number]  Chief Complaint  Patient presents with   New Patient (Initial Visit)    Consult to schedule for bilateral femoral endarterectomy     HPI Henry Hill is a 60 y.o. male.  I am asked to see the patient by Dr. [name] for evaluation of significant peripheral arterial disease with short distance claudication.  Both lower extremities are affected.  The left may be more little severe than the right, but he gets pain in both legs with any significant distances of walking.  No open wounds or infection.  Does not really describe rest pain symptoms but does have some numbness and some pain on the left that could be consistent with that.  He underwent an angiogram earlier this month which demonstrated severe common femoral, profunda femoris, and proximal superficial femoral artery disease with high-grade stenosis associated with bulky calcific plaque.  He did have more distal SFA and SFA and popliteal disease as well, with reasonable flow in his tibial vessels.  His femoral bifurcation disease appeared to be the most severe and critical aspect at this point, see he is referred for evaluation for surgical revascularization of this.  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  He has been in atrial fibrillation and is on Xarelto now.   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 08/2020 s/p ex lap & R colectomy.   Diastolic dysfunction    a 08/2020 Echo: EF 50-55%, no rwma, Gr1 DD, nl RV size/fxn, triv MR.   Essential hypertension    ETOH abuse    Persistent atrial fibrillation ([place])    a. Dx 08/2020 in setting of cecal volvulus s/p R colectomy.  CHA2DS'@VASc'$  = 1.   Tobacco abuse    Varicose veins of both lower extremities     Past Surgical History:  Procedure Laterality Date   ABDOMINAL AORTOGRAM W/LOWER EXTREMITY N/A 12/10/2020   Procedure: ABDOMINAL  AORTOGRAM W/LOWER EXTREMITY;  Surgeon: [name], MD;  Location: [facility];  Service: Cardiovascular;  Laterality: N/A;   BACK SURGERY  05/10/2001   COLONOSCOPY WITH PROPOFOL N/A 11/24/2020   Procedure: COLONOSCOPY WITH PROPOFOL;  Surgeon: [name], MD;  [facility];  Service: Gastroenterology;  Laterality: N/A;   HEMORRHOID SURGERY N/A 11/13/2014   Procedure: LEFT LATERAL HEMORRHOIDECTOMY  ;  Surgeon: [name], MD;  Location: [facility];  Service: General;  Laterality: N/A;   JOINT REPLACEMENT     LAPAROTOMY Right 08/30/2020   Procedure: EXPLORATORY LAPAROTOMY WITH RIGHT COLOCTOMY;  Surgeon: [name], MD;  Location: [facility];  Service: General;  Laterality: Right;     Family History  Problem Relation Age of Onset   Heart attack Mother    Diabetes Father    Hypertension Father       Social History   Tobacco Use   Smoking status: Every Day    Packs/day: 0.50    Types: Cigarettes   Smokeless tobacco: Never  Vaping Use   Vaping Use: Never used  Substance Use Topics   Alcohol use: Yes    Alcohol/week: 20.0 standard drinks    Types: 20 Cans of beer per week    Comment: MODERATE USE   Drug use: No     No Known Allergies  Current Outpatient Medications  Medication Sig  Dispense Refill   atorvastatin (LIPITOR) 10 MG tablet Take 1 tablet (10 mg total) by mouth daily. 30 tablet 5   metoprolol tartrate (LOPRESSOR) 50 MG tablet TAKE 1.5 TABLETS BY MOUTH 2 TIMES DAILY. 90 tablet 3   Misc Natural Products (GLUCOSAMINE CHOND MSM FORMULA PO) Take 1 tablet by mouth daily.     Multiple Vitamins-Minerals (MULTIVITAMIN ADULT PO) Take 1 tablet by mouth daily.     rivaroxaban (XARELTO) 20 MG TABS tablet Take 1 tablet (20 mg total) by mouth daily with supper. 30 tablet 11   No current facility-administered medications for this visit.      REVIEW OF SYSTEMS (Negative unless checked)  Constitutional: '[]'$ Weight loss  '[]'$ Fever   '[]'$ Chills Cardiac: '[]'$ Chest pain   '[]'$ Chest pressure   '[]'$ Palpitations   '[]'$ Shortness of breath when laying flat   '[]'$ Shortness of breath at rest   '[]'$ Shortness of breath with exertion. Vascular:  '[x]'$ Pain in legs with walking   '[]'$ Pain in legs at rest   '[]'$ Pain in legs when laying flat   '[x]'$ Claudication   '[]'$ Pain in feet when walking  '[]'$ Pain in feet at rest  '[]'$ Pain in feet when laying flat   '[]'$ History of DVT   '[]'$ Phlebitis   '[]'$ Swelling in legs   '[x]'$ Varicose veins   '[]'$ Non-healing ulcers Pulmonary:   '[]'$ Uses home oxygen   '[]'$ Productive cough   '[]'$ Hemoptysis   '[]'$ Wheeze  '[]'$ COPD   '[]'$ Asthma Neurologic:  '[]'$ Dizziness  '[]'$ Blackouts   '[]'$ Seizures   '[]'$ History of stroke   '[]'$ History of TIA  '[]'$ Aphasia   '[]'$ Temporary blindness   '[]'$ Dysphagia   '[]'$ Weakness or numbness in arms   '[]'$ Weakness or numbness in legs Musculoskeletal:  '[x]'$ Arthritis   '[]'$ Joint swelling   '[]'$ Joint pain   '[]'$ Low back pain Hematologic:  '[]'$ Easy bruising  '[]'$ Easy bleeding   '[]'$ Hypercoagulable state   '[]'$ Anemic  '[]'$ Hepatitis Gastrointestinal:  '[]'$ Blood in stool   '[]'$ Vomiting blood  '[]'$ Gastroesophageal reflux/heartburn   '[]'$ Abdominal pain Genitourinary:  '[]'$ Chronic kidney disease   '[]'$ Difficult urination  '[]'$ Frequent urination  '[]'$ Burning with urination   '[]'$ Hematuria Skin:  '[]'$ Rashes   '[]'$ Ulcers   '[]'$ Wounds Psychological:  '[]'$ History of anxiety   '[]'$  History of major depression.    Physical Exam BP (!) 172/89 (BP Location: Right Arm)   Pulse 75   Resp 16   Ht 6' (1.829 m)   Wt 187 lb 6.4 oz (85 kg)   BMI 25.42 kg/m  Gen:  WD/WN, NAD Head: [place]/AT, No temporalis wasting.  Ear/Nose/Throat: Hearing grossly intact, nares w/o erythema or drainage, oropharynx w/o Erythema/Exudate Eyes: Conjunctiva clear, sclera non-icteric  Neck: trachea midline.  No JVD.  Pulmonary:  Good air movement, respirations not labored, no use of accessory muscles  Cardiac: RRR, no JVD Vascular:  Vessel Right Left  Radial Palpable Palpable                          DP Not palpable Trace  PT 1+ 1+    Gastrointestinal:. No masses, surgical incisions, or scars. Musculoskeletal: M/S 5/5 throughout.  Extremities without ischemic changes.  No deformity or atrophy.  Trace lower extremity edema. Neurologic: Sensation grossly intact in extremities.  Symmetrical.  Speech is fluent. Motor exam as listed above. Psychiatric: Judgment intact, Mood & affect appropriate for pt's clinical situation. Dermatologic: No rashes or ulcers noted.  No cellulitis or open wounds.    Radiology PERIPHERAL VASCULAR CATHETERIZATION  Addendum Date: 12/25/2020   1.  No significant aortoiliac disease 2.  Right lower  extremity: Severe heavily calcified subtotal occlusion of the common femoral artery extending into the ostium of both SFA and profunda, occluded distal SFA with three-vessel runoff below the knee 3.  Left lower extremity: Significant heavily calcified left common femoral artery stenosis extending into the ostium of the SFA, short occlusion of the proximal to mid SFA, occluded proximal popliteal artery and three-vessel runoff below the knee. Recommendations: Recommend bilateral common femoral artery endarterectomy to improve claudication.  Consider starting with the left side given that he is more symptomatic on that side. Resume Xarelto tomorrow if no bleeding issues. I will consult vascular surgery.  Result Date: 12/25/2020 1.  No significant aortoiliac disease 2.  Right lower extremity: Severe heavily calcified subtotal occlusion of the common femoral artery extending into the ostium of both SFA and profunda, occluded distal SFA with three-vessel runoff below the knee 3.  Left lower extremity: Significant heavily calcified left common femoral artery stenosis extending into the ostium of the SFA, short occlusion of the proximal to mid SFA, occluded proximal popliteal artery and three-vessel runoff below the knee. Recommendations: Recommend bilateral common femoral artery endarterectomy to improve claudication.   Consider starting with the left side given that he is more symptomatic on that side. Resume Xarelto tomorrow if no bleeding issues. I consulted Dr. Doristine Devoid Recent Results (from the past 2160 hour(s))  NM Myocar Multi W/Spect W/Wall Motion / EF     Status: None   Collection Time: 10/09/20 10:55 AM  Result Value Ref Range   Rest HR 62 bpm   Rest BP 148/84 mmHg   Percent HR 58 %   Peak HR 93 bpm   Peak BP 150/77 mmHg   SSS 9    SRS 12    SDS 0    TID 1.13    LV sys vol 53 mL   LV dias vol 142 62 - 150 mL  Surgical pathology     Status: None   Collection Time: 11/24/20  8:21 AM  Result Value Ref Range   SURGICAL PATHOLOGY      SURGICAL PATHOLOGY CASE: [id number] PATIENT: Haskell Flirt Surgical Pathology Report     Specimen Submitted: A. Colon polyp, transverse; cbx B. Colon polyp, descending; cold snare C. Colon polyp x6, rectum sigmoid, cs (5); hs (1)  Clinical History: History of cecal volvulus. Findings: Colon polyps     DIAGNOSIS: A.  COLON POLYP, TRANSVERSE; COLD BIOPSY: - TUBULAR ADENOMA. - NEGATIVE FOR HIGH-GRADE DYSPLASIA AND MALIGNANCY.  B.  COLON POLYP, DESCENDING; COLD SNARE: - TUBULAR ADENOMA. - NEGATIVE FOR HIGH-GRADE DYSPLASIA AND MALIGNANCY.  C.  COLON POLYP X 6, RECTOSIGMOID AND RECTUM; COLD SNARE (5) AND HOT SNARE (1): - LARGE PEDUNCULATED TUBULAR ADENOMA ON 5 MM STALK, NEGATIVE FOR HIGH-GRADE DYSPLASIA AND MALIGNANCY. - STALK MARGINS APPEAR FREE OF ADENOMATOUS MUCOSA - HYPERPLASTIC POLYPS, 3 FRAGMENTS, NEGATIVE FOR DYSPLASIA AND MALIGNANCY. - LEIOMYOMA OF MUSCULARIS MUCOSAE, 1 FRAGMENT, NEGATIVE FOR ATYPIA AND MALIGNANCY. - 2 FRAGMENTS OF UNREMARKABLE MU COSA.  GROSS DESCRIPTION: A. Labeled: cbx polyp transverse colon Received: Formalin Collection time: 8:21 AM on 11/24/2020 Placed into formalin time: 8:21 AM on 11/24/2020 Tissue fragment(s): 1 Size: 0.6 x 0.4 x 0.1 cm Description: Tan-pink soft tissue fragment Entirely submitted  in 1 cassette.  B. Labeled: Cold snare polyp descending colon Received: Formalin Collection time: 8:24 AM on 11/24/2020 Placed into formalin time: 8:24 AM on 11/24/2020 Tissue fragment(s): 2 Size: Range from 0.3-1.2 cm Description: Received are fragments of tan-pink  soft tissue.  The larger fragment has a resection margin which is inked blue.  This fragment is serially sectioned. Entirely submitted in cassettes 1-2 with the serially sectioned fragment in cassette 1 and the remaining fragment in cassette 2.  C. Labeled: Hot snare polyp x1/cold snare x5 rectal/sigmoid colon Received: Formalin Collection time: 8:21 AM on 11/24/2020 Placed into formalin time: 8:21 AM on 11/24/2020 Tissue frag ment(s): Multiple Size: Aggregate, 2.2 x 1.5 x 0.9 cm Description: Received are fragments of white-tan to red soft tissue. The 2 largest fragments have resection margins which are differentially inked.  These fragments are sectioned. Entirely submitted in cassettes 1-4 with 1 serially sectioned fragment in cassette 1, 1 serially sectioned fragment in cassettes 2-3, and the remaining fragments in cassette 4.  RB 11/24/2020  Final Diagnosis performed by Bryan Lemma, MD.   Electronically signed 11/25/2020 5:16:53PM The electronic signature indicates that the named Attending Pathologist has evaluated the specimen Technical component performed at [facility], [address] [place], [place] [address] Lab: [phone] Dir: Rush Farmer, MD, MMM  Professional component performed at [facility], [facility], [place], [place], [place] [address] Lab: [phone] Dir: Dellia Nims. Rubinas, MD   Basic metabolic panel     Status: Abnormal   Collection Time: 12/03/20  3:13 PM  Result Value Ref Range   Sodium 128 (L) 135 - 145 mmol/L   Potassium 4.1 3.5 - 5.1 mmol/L   Chloride 98 98 - 111 mmol/L   CO2 24 22 - 32 mmol/L   Glucose, Bld 93 70 - 99 mg/dL    Comment: Glucose reference range  applies only to samples taken after fasting for at least 8 hours.   BUN 13 6 - 20 mg/dL   Creatinine, Ser 0.72 0.61 - 1.24 mg/dL   Calcium 8.9 8.9 - 10.3 mg/dL   GFR, Estimated >60 >60 mL/min    Comment: (NOTE) Calculated using the CKD-EPI Creatinine Equation (2021)    Anion gap 6 5 - 15    Comment: Performed at [facility], [address] [place], [place] [address]  CBC     Status: Abnormal   Collection Time: 12/03/20  3:13 PM  Result Value Ref Range   WBC 10.9 (H) 4.0 - 10.5 K/uL   RBC 3.97 (L) 4.22 - 5.81 MIL/uL   Hemoglobin 13.3 13.0 - 17.0 g/dL   HCT 38.0 (L) 39.0 - 52.0 %   MCV 95.7 80.0 - 100.0 fL   MCH 33.5 26.0 - 34.0 pg   MCHC 35.0 30.0 - 36.0 g/dL   RDW 13.7 11.5 - 15.5 %   Platelets 253 150 - 400 K/uL   nRBC 0.0 0.0 - 0.2 %    Comment: Performed at [facility], [address], [place], [place] [address]    Assessment/Plan:  Atherosclerosis of native arteries of extremity with intermittent claudication [facility]) The patient has severe common femoral, profunda femoris, and superficial femoral artery disease bilaterally as well as some SFA and popliteal disease on both sides.  He does have disabling claudication symptoms and is otherwise reasonably young and continues to work.  I have discussed the situation with the patient.  Femoral endarterectomies would be planned to improve his perfusion.  We could address his infrainguinal disease concomitantly, but with a nonlimb threatening situation this could also be addressed in the future if need be.  I have discussed the typical recovery, the typical hospital course, and the risks and benefits.  The patient is agreeable  to proceed with bilateral femoral endarterectomies  Essential (primary) hypertension blood pressure control important in reducing the progression of atherosclerotic disease. On appropriate oral medications.   Atrial fibrillation with RVR (HCC) On anticoagulation and now rate  controlled  Varicose veins of leg with pain, bilateral Status posttreatment several years ago and now doing well from this.      Leotis Pain 12/30/2020, 4:20 PM   This note was created with Dragon medical transcription system.  Any errors from dictation are unintentional.

## 2020-12-30 NOTE — Assessment & Plan Note (Signed)
blood pressure control important in reducing the progression of atherosclerotic disease. On appropriate oral medications.  

## 2020-12-30 NOTE — H&P (View-Only) (Signed)
Patient ID: Henry Hill, male   DOB: 08-Feb-1961, 60 y.o.   MRN: [id number]  Chief Complaint  Patient presents with   New Patient (Initial Visit)    Consult to schedule for bilateral femoral endarterectomy     HPI Henry Hill is a 60 y.o. male.  I am asked to see the patient by Dr. [name] for evaluation of significant peripheral arterial disease with short distance claudication.  Both lower extremities are affected.  The left may be more little severe than the right, but he gets pain in both legs with any significant distances of walking.  No open wounds or infection.  Does not really describe rest pain symptoms but does have some numbness and some pain on the left that could be consistent with that.  He underwent an angiogram earlier this month which demonstrated severe common femoral, profunda femoris, and proximal superficial femoral artery disease with high-grade stenosis associated with bulky calcific plaque.  He did have more distal SFA and SFA and popliteal disease as well, with reasonable flow in his tibial vessels.  His femoral bifurcation disease appeared to be the most severe and critical aspect at this point, see he is referred for evaluation for surgical revascularization of this.  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  He has been in atrial fibrillation and is on Xarelto now.   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 08/2020 s/p ex lap & R colectomy.   Diastolic dysfunction    a 08/2020 Echo: EF 50-55%, no rwma, Gr1 DD, nl RV size/fxn, triv MR.   Essential hypertension    ETOH abuse    Persistent atrial fibrillation ([place])    a. Dx 08/2020 in setting of cecal volvulus s/p R colectomy.  CHA2DS'@VASc'$  = 1.   Tobacco abuse    Varicose veins of both lower extremities     Past Surgical History:  Procedure Laterality Date   ABDOMINAL AORTOGRAM W/LOWER EXTREMITY N/A 12/10/2020   Procedure: ABDOMINAL  AORTOGRAM W/LOWER EXTREMITY;  Surgeon: [name], MD;  Location: [place];  Service: Cardiovascular;  Laterality: N/A;   BACK SURGERY  05/10/2001   COLONOSCOPY WITH PROPOFOL N/A 11/24/2020   Procedure: COLONOSCOPY WITH PROPOFOL;  Surgeon: [name], MD;  Location: [facility];  Service: Gastroenterology;  Laterality: N/A;   HEMORRHOID SURGERY N/A 11/13/2014   Procedure: LEFT LATERAL HEMORRHOIDECTOMY  ;  Surgeon: [name], MD;  Location: [facility] ORS;  Service: General;  Laterality: N/A;   JOINT REPLACEMENT     LAPAROTOMY Right 08/30/2020   Procedure: EXPLORATORY LAPAROTOMY WITH RIGHT COLOCTOMY;  Surgeon: [name], MD;  Location: [facility] ORS;  Service: General;  Laterality: Right;     Family History  Problem Relation Age of Onset   Heart attack Mother    Diabetes Father    Hypertension Father       Social History   Tobacco Use   Smoking status: Every Day    Packs/day: 0.50    Types: Cigarettes   Smokeless tobacco: Never  Vaping Use   Vaping Use: Never used  Substance Use Topics   Alcohol use: Yes    Alcohol/week: 20.0 standard drinks    Types: 20 Cans of beer per week    Comment: MODERATE USE   Drug use: No     No Known Allergies  Current Outpatient Medications  Medication Sig  Dispense Refill   atorvastatin (LIPITOR) 10 MG tablet Take 1 tablet (10 mg total) by mouth daily. 30 tablet 5   metoprolol tartrate (LOPRESSOR) 50 MG tablet TAKE 1.5 TABLETS BY MOUTH 2 TIMES DAILY. 90 tablet 3   Misc Natural Products (GLUCOSAMINE CHOND MSM FORMULA PO) Take 1 tablet by mouth daily.     Multiple Vitamins-Minerals (MULTIVITAMIN ADULT PO) Take 1 tablet by mouth daily.     rivaroxaban (XARELTO) 20 MG TABS tablet Take 1 tablet (20 mg total) by mouth daily with supper. 30 tablet 11   No current facility-administered medications for this visit.      REVIEW OF SYSTEMS (Negative unless checked)  Constitutional: '[]'$ Weight loss  '[]'$ Fever   '[]'$ Chills Cardiac: '[]'$ Chest pain   '[]'$ Chest pressure   '[]'$ Palpitations   '[]'$ Shortness of breath when laying flat   '[]'$ Shortness of breath at rest   '[]'$ Shortness of breath with exertion. Vascular:  '[x]'$ Pain in legs with walking   '[]'$ Pain in legs at rest   '[]'$ Pain in legs when laying flat   '[x]'$ Claudication   '[]'$ Pain in feet when walking  '[]'$ Pain in feet at rest  '[]'$ Pain in feet when laying flat   '[]'$ History of DVT   '[]'$ Phlebitis   '[]'$ Swelling in legs   '[x]'$ Varicose veins   '[]'$ Non-healing ulcers Pulmonary:   '[]'$ Uses home oxygen   '[]'$ Productive cough   '[]'$ Hemoptysis   '[]'$ Wheeze  '[]'$ COPD   '[]'$ Asthma Neurologic:  '[]'$ Dizziness  '[]'$ Blackouts   '[]'$ Seizures   '[]'$ History of stroke   '[]'$ History of TIA  '[]'$ Aphasia   '[]'$ Temporary blindness   '[]'$ Dysphagia   '[]'$ Weakness or numbness in arms   '[]'$ Weakness or numbness in legs Musculoskeletal:  '[x]'$ Arthritis   '[]'$ Joint swelling   '[]'$ Joint pain   '[]'$ Low back pain Hematologic:  '[]'$ Easy bruising  '[]'$ Easy bleeding   '[]'$ Hypercoagulable state   '[]'$ Anemic  '[]'$ Hepatitis Gastrointestinal:  '[]'$ Blood in stool   '[]'$ Vomiting blood  '[]'$ Gastroesophageal reflux/heartburn   '[]'$ Abdominal pain Genitourinary:  '[]'$ Chronic kidney disease   '[]'$ Difficult urination  '[]'$ Frequent urination  '[]'$ Burning with urination   '[]'$ Hematuria Skin:  '[]'$ Rashes   '[]'$ Ulcers   '[]'$ Wounds Psychological:  '[]'$ History of anxiety   '[]'$  History of major depression.    Physical Exam BP (!) 172/89 (BP Location: Right Arm)   Pulse 75   Resp 16   Ht 6' (1.829 m)   Wt 187 lb 6.4 oz (85 kg)   BMI 25.42 kg/m  Gen:  WD/WN, NAD Head: [place]/AT, No temporalis wasting.  Ear/Nose/Throat: Hearing grossly intact, nares w/o erythema or drainage, oropharynx w/o Erythema/Exudate Eyes: Conjunctiva clear, sclera non-icteric  Neck: trachea midline.  No JVD.  Pulmonary:  Good air movement, respirations not labored, no use of accessory muscles  Cardiac: RRR, no JVD Vascular:  Vessel Right Left  Radial Palpable Palpable                          DP Not palpable Trace  PT 1+ 1+    Gastrointestinal:. No masses, surgical incisions, or scars. Musculoskeletal: M/S 5/5 throughout.  Extremities without ischemic changes.  No deformity or atrophy.  Trace lower extremity edema. Neurologic: Sensation grossly intact in extremities.  Symmetrical.  Speech is fluent. Motor exam as listed above. Psychiatric: Judgment intact, Mood & affect appropriate for pt's clinical situation. Dermatologic: No rashes or ulcers noted.  No cellulitis or open wounds.    Radiology PERIPHERAL VASCULAR CATHETERIZATION  Addendum Date: 12/25/2020   1.  No significant aortoiliac disease 2.  Right lower  extremity: Severe heavily calcified subtotal occlusion of the common femoral artery extending into the ostium of both SFA and profunda, occluded distal SFA with three-vessel runoff below the knee 3.  Left lower extremity: Significant heavily calcified left common femoral artery stenosis extending into the ostium of the SFA, short occlusion of the proximal to mid SFA, occluded proximal popliteal artery and three-vessel runoff below the knee. Recommendations: Recommend bilateral common femoral artery endarterectomy to improve claudication.  Consider starting with the left side given that he is more symptomatic on that side. Resume Xarelto tomorrow if no bleeding issues. I will consult vascular surgery.  Result Date: 12/25/2020 1.  No significant aortoiliac disease 2.  Right lower extremity: Severe heavily calcified subtotal occlusion of the common femoral artery extending into the ostium of both SFA and profunda, occluded distal SFA with three-vessel runoff below the knee 3.  Left lower extremity: Significant heavily calcified left common femoral artery stenosis extending into the ostium of the SFA, short occlusion of the proximal to mid SFA, occluded proximal popliteal artery and three-vessel runoff below the knee. Recommendations: Recommend bilateral common femoral artery endarterectomy to improve claudication.   Consider starting with the left side given that he is more symptomatic on that side. Resume Xarelto tomorrow if no bleeding issues. I consulted Dr. Doristine Devoid Recent Results (from the past 2160 hour(s))  NM Myocar Multi W/Spect W/Wall Motion / EF     Status: None   Collection Time: 10/09/20 10:55 AM  Result Value Ref Range   Rest HR 62 bpm   Rest BP 148/84 mmHg   Percent HR 58 %   Peak HR 93 bpm   Peak BP 150/77 mmHg   SSS 9    SRS 12    SDS 0    TID 1.13    LV sys vol 53 mL   LV dias vol 142 62 - 150 mL  Surgical pathology     Status: None   Collection Time: 11/24/20  8:21 AM  Result Value Ref Range   SURGICAL PATHOLOGY      SURGICAL PATHOLOGY CASE: [id number] PATIENT: Haskell Flirt Surgical Pathology Report     Specimen Submitted: A. Colon polyp, transverse; cbx B. Colon polyp, descending; cold snare C. Colon polyp x6, rectum sigmoid, cs (5); hs (1)  Clinical History: History of cecal volvulus. Findings: Colon polyps     DIAGNOSIS: A.  COLON POLYP, TRANSVERSE; COLD BIOPSY: - TUBULAR ADENOMA. - NEGATIVE FOR HIGH-GRADE DYSPLASIA AND MALIGNANCY.  B.  COLON POLYP, DESCENDING; COLD SNARE: - TUBULAR ADENOMA. - NEGATIVE FOR HIGH-GRADE DYSPLASIA AND MALIGNANCY.  C.  COLON POLYP X 6, RECTOSIGMOID AND RECTUM; COLD SNARE (5) AND HOT SNARE (1): - LARGE PEDUNCULATED TUBULAR ADENOMA ON 5 MM STALK, NEGATIVE FOR HIGH-GRADE DYSPLASIA AND MALIGNANCY. - STALK MARGINS APPEAR FREE OF ADENOMATOUS MUCOSA - HYPERPLASTIC POLYPS, 3 FRAGMENTS, NEGATIVE FOR DYSPLASIA AND MALIGNANCY. - LEIOMYOMA OF MUSCULARIS MUCOSAE, 1 FRAGMENT, NEGATIVE FOR ATYPIA AND MALIGNANCY. - 2 FRAGMENTS OF UNREMARKABLE MU COSA.  GROSS DESCRIPTION: A. Labeled: cbx polyp transverse colon Received: Formalin Collection time: 8:21 AM on 11/24/2020 Placed into formalin time: 8:21 AM on 11/24/2020 Tissue fragment(s): 1 Size: 0.6 x 0.4 x 0.1 cm Description: Tan-pink soft tissue fragment Entirely submitted  in 1 cassette.  B. Labeled: Cold snare polyp descending colon Received: Formalin Collection time: 8:24 AM on 11/24/2020 Placed into formalin time: 8:24 AM on 11/24/2020 Tissue fragment(s): 2 Size: Range from 0.3-1.2 cm Description: Received are fragments of tan-pink  soft tissue.  The larger fragment has a resection margin which is inked blue.  This fragment is serially sectioned. Entirely submitted in cassettes 1-2 with the serially sectioned fragment in cassette 1 and the remaining fragment in cassette 2.  C. Labeled: Hot snare polyp x1/cold snare x5 rectal/sigmoid colon Received: Formalin Collection time: 8:21 AM on 11/24/2020 Placed into formalin time: 8:21 AM on 11/24/2020 Tissue frag ment(s): Multiple Size: Aggregate, 2.2 x 1.5 x 0.9 cm Description: Received are fragments of white-tan to red soft tissue. The 2 largest fragments have resection margins which are differentially inked.  These fragments are sectioned. Entirely submitted in cassettes 1-4 with 1 serially sectioned fragment in cassette 1, 1 serially sectioned fragment in cassettes 2-3, and the remaining fragments in cassette 4.  RB 11/24/2020  Final Diagnosis performed by Bryan Lemma, MD.   Electronically signed 11/25/2020 5:16:53PM The electronic signature indicates that the named Attending Pathologist has evaluated the specimen Technical component performed at [facility], [address] [place], [place] [address] Lab: [phone] Dir: Rush Farmer, MD, MMM  Professional component performed at [facility], [facility], [place], [place], [place] [address] Lab: [phone] Dir: Dellia Nims. Rubinas, MD   Basic metabolic panel     Status: Abnormal   Collection Time: 12/03/20  3:13 PM  Result Value Ref Range   Sodium 128 (L) 135 - 145 mmol/L   Potassium 4.1 3.5 - 5.1 mmol/L   Chloride 98 98 - 111 mmol/L   CO2 24 22 - 32 mmol/L   Glucose, Bld 93 70 - 99 mg/dL    Comment: Glucose reference range  applies only to samples taken after fasting for at least 8 hours.   BUN 13 6 - 20 mg/dL   Creatinine, Ser 0.72 0.61 - 1.24 mg/dL   Calcium 8.9 8.9 - 10.3 mg/dL   GFR, Estimated >60 >60 mL/min    Comment: (NOTE) Calculated using the CKD-EPI Creatinine Equation (2021)    Anion gap 6 5 - 15    Comment: Performed at [facility], [address] [place], [place] [address]  CBC     Status: Abnormal   Collection Time: 12/03/20  3:13 PM  Result Value Ref Range   WBC 10.9 (H) 4.0 - 10.5 K/uL   RBC 3.97 (L) 4.22 - 5.81 MIL/uL   Hemoglobin 13.3 13.0 - 17.0 g/dL   HCT 38.0 (L) 39.0 - 52.0 %   MCV 95.7 80.0 - 100.0 fL   MCH 33.5 26.0 - 34.0 pg   MCHC 35.0 30.0 - 36.0 g/dL   RDW 13.7 11.5 - 15.5 %   Platelets 253 150 - 400 K/uL   nRBC 0.0 0.0 - 0.2 %    Comment: Performed at [facility], [address], [place], [place] [address]    Assessment/Plan:  Atherosclerosis of native arteries of extremity with intermittent claudication [facility]) The patient has severe common femoral, profunda femoris, and superficial femoral artery disease bilaterally as well as some SFA and popliteal disease on both sides.  He does have disabling claudication symptoms and is otherwise reasonably young and continues to work.  I have discussed the situation with the patient.  Femoral endarterectomies would be planned to improve his perfusion.  We could address his infrainguinal disease concomitantly, but with a nonlimb threatening situation this could also be addressed in the future if need be.  I have discussed the typical recovery, the typical hospital course, and the risks and benefits.  The patient is agreeable  to proceed with bilateral femoral endarterectomies  Essential (primary) hypertension blood pressure control important in reducing the progression of atherosclerotic disease. On appropriate oral medications.   Atrial fibrillation with RVR (HCC) On anticoagulation and now rate  controlled  Varicose veins of leg with pain, bilateral Status posttreatment several years ago and now doing well from this.      Leotis Pain 12/30/2020, 4:20 PM   This note was created with Dragon medical transcription system.  Any errors from dictation are unintentional.

## 2021-01-05 ENCOUNTER — Encounter (INDEPENDENT_AMBULATORY_CARE_PROVIDER_SITE_OTHER): Payer: Self-pay

## 2021-01-08 ENCOUNTER — Telehealth: Payer: Self-pay | Admitting: *Deleted

## 2021-01-08 ENCOUNTER — Other Ambulatory Visit
Admission: RE | Admit: 2021-01-08 | Discharge: 2021-01-08 | Disposition: A | Discharge status: Home / Self Care | Payer: 59 | Source: Ambulatory Visit | Attending: Vascular Surgery | Admitting: Vascular Surgery

## 2021-01-08 ENCOUNTER — Other Ambulatory Visit: Payer: Self-pay

## 2021-01-08 ENCOUNTER — Other Ambulatory Visit (INDEPENDENT_AMBULATORY_CARE_PROVIDER_SITE_OTHER): Payer: Self-pay | Admitting: Nurse Practitioner

## 2021-01-08 HISTORY — DX: Cardiac arrhythmia, unspecified: I49.9

## 2021-01-08 NOTE — Telephone Encounter (Signed)
-----   Message from Karen Kitchens, NP sent at 01/08/2021  2:47 PM EDT ----- Regarding: Request for pre-operative cardiac clearance Request for pre-operative cardiac clearance:  1. What type of surgery is being performed?  BILATERAL FEMORAL ENDARTERECTOMIES  2. When is this surgery scheduled?  01/14/2021  3. Are there any medications that need to be held prior to surgery? RIVAROXABAN x 2 days  4. Practice name and name of physician performing surgery?  Performing surgeon: Dr. Leotis Pain, MD Requesting clearance: Honor Loh, FNP-C    5. Anesthesia type (none, local, MAC, general)? General   6. What is the office phone and fax number?   Phone: [phone] Fax: [phone]  ATTENTION: Unable to create telephone message as per your standard workflow. Directed by [facility] providers to send requests for cardiac clearance to this pool for appropriate distribution to provider covering pre-operative clearances.   Honor Loh, MSN, APRN, FNP-C, CEN [facility]  Peri-operative Services Nurse Practitioner Phone: [phone] 01/08/21 2:47 PM

## 2021-01-08 NOTE — Patient Instructions (Addendum)
Your procedure is scheduled on: Wednesday January 14, 2021. Report to Day Surgery inside [place] 2nd floor stop by admissions desk first before getting on elevator. To find out your arrival time please call [phone] between 1PM - 3PM on Tuesday January 13, 2021.  Remember: Instructions that are not followed completely may result in serious medical risk,  up to and including death, or upon the discretion of your surgeon and anesthesiologist your  surgery may need to be rescheduled.     _X__ 1. Do not eat food or drink fluids after midnight the night before your procedure.                 No chewing gum or hard candies.   __X__2.  On the morning of surgery brush your teeth with toothpaste and water, you                may rinse your mouth with mouthwash if you wish.  Do not swallow any toothpaste of mouthwash.     _X__ 3.  No Alcohol for 24 hours before or after surgery.   _X__ 4.  Do Not Smoke or use e-cigarettes For 24 Hours Prior to Your Surgery.                 Do not use any chewable tobacco products for at least 6 hours prior to                 Surgery.  _X__  5.  Do not use any recreational drugs (marijuana, cocaine, heroin, ecstasy, MDMA or other)                For at least one week prior to your surgery.  Combination of these drugs with anesthesia                May have life threatening results.  __X__ 6.  Notify your doctor if there is any change in your medical condition      (cold, fever, infections).     Do not wear jewelry, make-up, hairpins, clips or nail polish. Do not wear lotions, powders, or perfumes. You may wear deodorant. Do not shave 48 hours prior to surgery. Men may shave face and neck. Do not bring valuables to the hospital.    [facility] is not responsible for any belongings or valuables.  Contacts, dentures or bridgework may not be worn into surgery. Leave your suitcase in the car. After surgery it may be brought to  your room. For patients admitted to the hospital, discharge time is determined by your treatment team.   Patients discharged the day of surgery will not be allowed to drive home.   Make arrangements for someone to be with you for the first 24 hours of your Same Day Discharge.   __X__ Take these medicines the morning of surgery with A SIP OF WATER:    1. metoprolol tartrate (LOPRESSOR) 50 MG  2.   3.   4.  5.  6.  ____ Fleet Enema (as directed)   __X__ Use CHG Soap (or wipes) as directed  ____ Use Benzoyl Peroxide Gel as instructed  ____ Use inhalers on the day of surgery  ____ Stop metformin 2 days prior to surgery    ____ Take 1/2 of usual insulin dose the night before surgery. No insulin the morning          of surgery.   __X__ Stop rivaroxaban (XARELTO) 20 MG 2 days  prior to surgery (take last dose Sunday 9/4)  __X__ One Week prior to surgery- Stop Anti-inflammatories such as Ibuprofen, Aleve, Advil, Motrin, meloxicam (MOBIC), diclofenac, etodolac, ketorolac, Toradol, Daypro, piroxicam, Goody's or BC powders. OK TO USE TYLENOL IF NEEDED   __X__ Stop supplements until after surgery. Misc Natural Products (GLUCOSAMINE CHOND MSM FORMULA PO)   ____ Bring C-Pap to the hospital.    If you have any questions regarding your pre-procedure instructions,  Please call Pre-admit Testing at [phone]

## 2021-01-08 NOTE — Telephone Encounter (Signed)
Pharmacy, can you please comment on how long Xarelto can be held?  Thank you!

## 2021-01-08 NOTE — Telephone Encounter (Signed)
Patient with diagnosis of afib on Xarelto for anticoagulation.    Procedure: BILATERAL FEMORAL ENDARTERECTOMIES  Date of procedure: 01/14/21   CHA2DS2-VASc Score = 2  This indicates a 2.2% annual risk of stroke. The patient's score is based upon: CHF History: No HTN History: Yes Diabetes History: No Stroke History: No Vascular Disease History: Yes Age Score: 0 Gender Score: 0      CrCl 119 ml/min  Per office protocol, patient can hold Xarelto for 2 days prior to procedure.

## 2021-01-08 NOTE — Telephone Encounter (Signed)
1. What type of surgery is being performed?  BILATERAL FEMORAL ENDARTERECTOMIES   2. When is this surgery scheduled?  01/14/2021     3. Are there any medications that need to be held prior to surgery?  RIVAROXABAN x 2 days   4. Practice name and name of physician performing surgery?  Performing surgeon: Dr. Leotis Pain, MD  Requesting clearance: Honor Loh, FNP-C       5. Anesthesia type (none, local, MAC, general)? General   6. What is the office phone and fax number?    Phone: [phone]  Fax: [phone]

## 2021-01-09 ENCOUNTER — Encounter: Payer: Self-pay | Admitting: Vascular Surgery

## 2021-01-09 ENCOUNTER — Encounter
Admission: RE | Admit: 2021-01-09 | Discharge: 2021-01-09 | Disposition: A | Discharge status: Home / Self Care | Payer: 59 | Source: Ambulatory Visit | Attending: Vascular Surgery | Admitting: Vascular Surgery

## 2021-01-09 DIAGNOSIS — Z01812 Encounter for preprocedural laboratory examination: Secondary | ICD-10-CM | POA: Diagnosis present

## 2021-01-09 LAB — CBC WITH DIFFERENTIAL/PLATELET
Abs Immature Granulocytes: 0.03 10*3/uL (ref 0.00–0.07)
Basophils Absolute: 0.1 10*3/uL (ref 0.0–0.1)
Basophils Relative: 1 %
Eosinophils Absolute: 3 10*3/uL — ABNORMAL HIGH (ref 0.0–0.5)
Eosinophils Relative: 25 %
HCT: 39.5 % (ref 39.0–52.0)
Hemoglobin: 13.8 g/dL (ref 13.0–17.0)
Immature Granulocytes: 0 %
Lymphocytes Relative: 21 %
Lymphs Abs: 2.6 10*3/uL (ref 0.7–4.0)
MCH: 32.9 pg (ref 26.0–34.0)
MCHC: 34.9 g/dL (ref 30.0–36.0)
MCV: 94.3 fL (ref 80.0–100.0)
Monocytes Absolute: 0.5 10*3/uL (ref 0.1–1.0)
Monocytes Relative: 4 %
Neutro Abs: 6 10*3/uL (ref 1.7–7.7)
Neutrophils Relative %: 49 %
Platelets: 277 10*3/uL (ref 150–400)
RBC: 4.19 MIL/uL — ABNORMAL LOW (ref 4.22–5.81)
RDW: 13.3 % (ref 11.5–15.5)
Smear Review: NORMAL
WBC: 12.2 10*3/uL — ABNORMAL HIGH (ref 4.0–10.5)
nRBC: 0 % (ref 0.0–0.2)

## 2021-01-09 LAB — BASIC METABOLIC PANEL
Anion gap: 6 (ref 5–15)
BUN: 10 mg/dL (ref 6–20)
CO2: 27 mmol/L (ref 22–32)
Calcium: 8.7 mg/dL — ABNORMAL LOW (ref 8.9–10.3)
Chloride: 98 mmol/L (ref 98–111)
Creatinine, Ser: 0.62 mg/dL (ref 0.61–1.24)
GFR, Estimated: >60 mL/min (ref >60)
Glucose, Bld: 149 mg/dL — ABNORMAL HIGH (ref 70–99)
Potassium: 3.8 mmol/L (ref 3.5–5.1)
Sodium: 131 mmol/L — ABNORMAL LOW (ref 135–145)

## 2021-01-09 LAB — PATHOLOGIST SMEAR REVIEW

## 2021-01-09 LAB — TYPE AND SCREEN
ABO/RH(D): O POS
Antibody Screen: NEGATIVE

## 2021-01-09 NOTE — Progress Notes (Signed)
Perioperative Services  Pre-Admission/Anesthesia Testing Clinical Review  Date: 01/09/21  Patient Demographics:  Name: Henry Hill DOB:   [birth year] MRN:   [id number]  Planned Surgical Procedure(s):    Case: [id number] Date/Time: 01/14/21 1302   Procedures:      ENDARTERECTOMY FEMORAL (Bilateral) - Bilateral femoral endarterectomies     APPLICATION OF CELL SAVER   Anesthesia type: General   Pre-op diagnosis: ASO with claudication I70.219   Location: [facility] OR ROOM 05 / [place] [facility]   Surgeons: [name], MD     NOTE: Available PAT nursing documentation and vital signs have been reviewed. Clinical nursing staff has updated patient's PMH/PSHx, current medication list, and drug allergies/intolerances to ensure comprehensive history available to assist in medical decision making as it pertains to the aforementioned surgical procedure and anticipated anesthetic course. Extensive review of available clinical information performed. [place] PMH and PSHx updated with any diagnoses/procedures that  may have been inadvertently omitted during his intake with the pre-admission testing department's nursing staff.  Clinical Discussion:  Henry Hill is a 60 y.o. male who is submitted for pre-surgical anesthesia review and clearance prior to him undergoing the above procedure. Patient is a Current Smoker. Pertinent PMH includes: persistent atrial fibrillation, diastolic dysfunction, HTN, lower extremity venous varicosities, aortic atherosclerosis, OA.  Patient is followed by cardiology [name], MD). He was last seen in the cardiology clinic on 12/25/2020; notes reviewed.  At the time of his clinic visit, patient doing well overall from a cardiovascular perspective.  He denied any episodes of chest pain, shortness breath, PND, orthopnea, palpitations, significant peripheral edema, palpitations, vertiginous symptoms, or presyncope/syncope.  Patient with significant bilateral lower  extremity claudication pain.  He is under the care of vascular surgery.  Patient with a PMH significant for cardiovascular diagnoses.  TTE performed on 08/31/2020 revealed a normal left ventricular systolic function with mild LVH.  LVEF 50-55%.  Diastolic parameters consistent with impaired relaxation (G1DD).  There was trivial mitral valve regurgitation noted.  No evidence of mitral or aortic valve stenosis.  Myocardial perfusion imaging study performed on 10/09/2020 revealed a low normal LVEF of 50%.  Attenuation images show significant aortic and coronary calcifications.  There was a small perfusion defect noted in the apex location likely due to apical thinning artifact.  Study determined to be suboptimal due to intense GI uptake and diaphragmatic attenuation.  There was no stress-induced arrhythmia noted. Study determined to be normal and low risk.  Lower extremity arterial duplex performed on 11/03/2020 revealed a 50-74% stenosis of the RIGHT common femoral artery and 75-99% stenosis of the LEFT common femoral artery.  Heavy irregular calcified plaques noted in the CFA, proximal PFA, and SFA bilaterally.  Short segment popliteal artery occlusion could not be excluded on the LEFT.  Vascular ABI/TBI studies performed on 11/03/2020 revealing moderate lower extremity arterial disease BILATERALLY.  TBIs were normal BILATERALLY.  Abdominal aortogram was performed on 12/10/2020 revealing no significant aortoiliac disease.  There was severe subtotal occlusion of the CFA extending into the ostium in both SFA and profunda, in addition to an occluded distal SFA with three-vessel runoff below the knee noted in the RIGHT lower extremity.  Contralaterally on the LEFT, significant heavily calcified CFA stenosis extending into the ostium of the SFA, in addition to short occlusion of the proximal to mid SFA and occluded proximal popliteal artery and three-vessel runoff below the knee.  Patient with a history of  PAF. CHA2DS2-VASc Score = 2 (HTN,  vascular disease). He is chronically anticoagulated using daily rivaroxaban; compliant with therapy with no evidence of GI bleeding.  Blood pressure elevated 160/90 on currently prescribed beta-blocker monotherapy. Of note, patient had just taken his medication prior to arrival.  We will follow-up next visit and add additional intervention for persistent elevation. Patient is on a statin for his HLD.  He is not diabetic.  Functional capacity limited significantly by patient's lower extremity claudication pain, however patient still able to achieve 4 METS of activity without angina/anginal equivalent symptoms.  No changes were made to his medication regimen.  Patient to follow-up with outpatient cardiology in 4 months or sooner if needed.  Patient is scheduled to undergo BILATERAL femoral endarterectomies on 01/14/2021 with Dr. Leotis Pain, MD.  Given patient's past medical history significant for cardiovascular disease, presurgical cardiac clearance was sought by the PAT team.  Per cardiology, "the patient has had a negative cardiac work-up in the last few months and should be considered at an overall LOW risk from a cardiac standpoint.  Given severe lifestyle limiting claudication, I recommend BILATERAL common femoral artery endarterectomies".  Again, this patient is on daily anticoagulation therapy.  He has been instructed on recommendations for holding his rivaroxaban for 2 days prior to his procedure with plans to restart as soon as postoperative bleeding risk felt to be minimized by his primary attending surgeon.  The patient is aware that his last dose of rivaroxaban will be on 01/11/2021  Patient denies previous perioperative complications with anesthesia in the past. In review of the available records, it is noted that patient underwent a general anesthetic course here (ASA III) in 11/2020 without documented complications.   Vitals with BMI 01/08/2021 12/30/2020 12/25/2020   Height '6\' 0"'$  '6\' 0"'$  '6\' 0"'$   Weight 183 lbs 187 lbs 6 oz 183 lbs 8 oz  BMI 24.81 [id number]  Systolic - [id number]  Diastolic - 89 90  Pulse - 75 74    Providers/Specialists:   NOTE: Primary physician provider listed below. Patient may have been seen by APP or partner within same practice.   PROVIDER ROLE / SPECIALTY LAST Imelda Pillow, MD Vascular Surgery 12/30/2020  Tania Ade Primary Care Provider 02/21/2020  [name], MD Cardiology 12/25/2020   Allergies:  Patient has no known allergies.  Current Home Medications:   No current facility-administered medications for this encounter.    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 08/2020 s/p ex lap & R colectomy.   Chronic anticoagulation    Rivaroxaban   Diastolic dysfunction    a.) 08/2020 Echo: EF 50-55%, no rwma, Gr1 DD, nl RV size/fxn, triv MR.   Essential hypertension    ETOH abuse    Hypo-osmolality and hyponatremia    Persistent atrial fibrillation ([place])    a.) Dx 08/2020 in setting of cecal volvulus s/p R colectomy.  b.) CHA2DS'@VASc'$  =  2.   Tobacco abuse    Varicose veins of both lower extremities    Past Surgical History:  Procedure Laterality Date   ABDOMINAL AORTOGRAM W/LOWER EXTREMITY N/A 12/10/2020   Procedure: ABDOMINAL AORTOGRAM W/LOWER EXTREMITY;  Surgeon: [name],  MD;  Location: [place] [facility];  Service: Cardiovascular;  Laterality: N/A;   BACK SURGERY  05/10/2001   COLONOSCOPY WITH PROPOFOL N/A 11/24/2020   Procedure: COLONOSCOPY WITH PROPOFOL;  Surgeon: [name], MD;  Location: [facility] ENDOSCOPY;  Service: Gastroenterology;  Laterality: N/A;    HEMORRHOID SURGERY N/A 11/13/2014   Procedure: LEFT LATERAL HEMORRHOIDECTOMY  ;  Surgeon: [name], MD;  Location: [facility]S;  Service: General;  Laterality: N/A;   JOINT REPLACEMENT Left 2018   LAPAROTOMY Right 08/30/2020   Procedure: EXPLORATORY LAPAROTOMY WITH RIGHT COLOCTOMY;  Surgeon: [name], MD;  Location: [facility] ORS;  Service: General;  Laterality: Right;   Family History  Problem Relation Age of Onset   Heart attack Mother    Diabetes Father    Hypertension Father    Social History   Tobacco Use   Smoking status: Every Day    Packs/day: 0.50    Types: Cigarettes   Smokeless tobacco: Never  Vaping Use   Vaping Use: Never used  Substance Use Topics   Alcohol use: Yes    Alcohol/week: 15.0 standard drinks    Types: 15 Cans of beer per week    Comment: MODERATE USE   Drug use: No    Pertinent Clinical Results:  LABS: Labs reviewed: Acceptable for surgery.  Hospital Outpatient Visit on 01/09/2021  Component Date Value Ref Range Status   WBC 01/09/2021 12.2 (A) 4.0 - 10.5 K/uL Final   RBC 01/09/2021 4.19 (A) 4.22 - 5.81 MIL/uL Final   Hemoglobin 01/09/2021 13.8  13.0 - 17.0 g/dL Final   HCT 01/09/2021 39.5  39.0 - 52.0 % Final   MCV 01/09/2021 94.3  80.0 - 100.0 fL Final   MCH 01/09/2021 32.9  26.0 - 34.0 pg Final   MCHC 01/09/2021 34.9  30.0 - 36.0 g/dL Final   RDW 01/09/2021 13.3  11.5 - 15.5 % Final   Platelets 01/09/2021 277  150 - 400 K/uL Final   nRBC 01/09/2021 0.0  0.0 - 0.2 % Final   Neutrophils Relative % 01/09/2021 49  % Final   Neutro Abs 01/09/2021 6.0  1.7 - 7.7 K/uL Final   Lymphocytes Relative 01/09/2021 21  % Final   Lymphs Abs 01/09/2021 2.6  0.7 - 4.0 K/uL Final   Monocytes Relative 01/09/2021 4  % Final   Monocytes Absolute 01/09/2021 0.5  0.1 - 1.0 K/uL Final   Eosinophils Relative 01/09/2021 25  % Final   Eosinophils Absolute 01/09/2021 3.0 (A) 0.0 - 0.5 K/uL Final   Basophils Relative 01/09/2021 1  % Final   Basophils Absolute  01/09/2021 0.1  0.0 - 0.1 K/uL Final   WBC Morphology 01/09/2021 MORPHOLOGY UNREMARKABLE   Final   RBC Morphology 01/09/2021 MORPHOLOGY UNREMARKABLE   Final   Smear Review 01/09/2021 Normal platelet morphology   Final   Immature Granulocytes 01/09/2021 0  % Final   Abs Immature Granulocytes 01/09/2021 0.03  0.00 - 0.07 K/uL Final   Performed at [facility], [place],  [address]   Sodium 01/09/2021 131 (A) 135 - 145 mmol/L Final   Potassium 01/09/2021 3.8  3.5 - 5.1 mmol/L Final   Chloride 01/09/2021 98  98 - 111 mmol/L Final   CO2 01/09/2021 27  22 - 32 mmol/L Final   Glucose, Bld 01/09/2021 149 (A) 70 - 99 mg/dL Final   Glucose reference range applies only to samples taken after fasting for at least 8 hours.   BUN 01/09/2021  10  6 - 20 mg/dL Final   Creatinine, Ser 01/09/2021 0.62  0.61 - 1.24 mg/dL Final   Calcium 01/09/2021 8.7 (A) 8.9 - 10.3 mg/dL Final   GFR, Estimated 01/09/2021 >60  >60 mL/min Final   Comment: (NOTE) Calculated using the CKD-EPI Creatinine Equation (2021)    Anion gap 01/09/2021 6  5 - 15 Final   Performed at [facility], [address] [place], [place] [address]    ECG: Date: 09/25/2020 Time ECG obtained: 1134 AM Rate: 73 bpm Rhythm: normal sinus Axis (leads I and aVF): Normal Intervals: PR 140 ms. QRS 82 ms. QTc 438 ms. ST segment and T wave changes: No evidence of acute ST segment elevation or depression Comparison: Similar to previous tracing obtained on 09/05/2020   IMAGING / PROCEDURES: ABDOMINAL AORTOGRAM performed on 12/10/2020 No significant aortoiliac disease Right lower extremity:  Severe heavily calcified subtotal occlusion of the common femoral artery extending into the ostium of both SFA and profunda, occluded distal SFA with three-vessel runoff below the knee Left lower extremity:  Significant heavily calcified left common femoral artery stenosis extending into the ostium of the SFA, short  occlusion of the proximal to mid SFA, occluded proximal popliteal artery and three-vessel runoff below the knee. Recommendations: Bilateral common femoral artery endarterectomy to improve claudication.   Consider starting with the left side given that he is more symptomatic on that side.  VASCULAR ABI WITH TBI performed on 11/03/2020 Right:  Resting right ankle-brachial index indicates moderate right lower extremity arterial disease.  The right toe-brachial index is abnormal.  Left:  Resting left ankle-brachial index indicates moderate left lower extremity arterial disease.  The left toe-brachial index is abnormal.   LOWER EXTREMITY ARTERIAL DUPLEX performed on 11/03/2020 Right:  50-74% stenosis noted in the common femoral artery.  Heavy irregular calcified plaque in the CFA and proximal PFA and SFA, with shadowing.  Cannot exclude a tighter stenosis in the CFA, and possible short segment occlusion in the distal SFA/popliteal. Left:  75-99% stenosis noted in the common femoral artery.  Heavy irregular calcified plaque in the CFA and proximal PFA and SFA, with shadowing.  Flow disturbance in distal SFA difficult to characterize, with multiple  collaterals in the distal SFA and  popliteal region.  Cannot exclude short segment popliteal artery occlusion.   LEXISCAN performed on 10/09/2020 Low normal left ventricular systolic function with an EF of 50% No T wave inversion noted during stress CT attenuation images show significant aortic and coronary calcifications There is a small defect of mild severity present in the apex location likely due to apical thinning artifact Very suboptimal study due to intense GI uptake and diaphragmatic attenuation.  Consider alternative testing of suspicion for obstructive CAD is high. Normal low risk study  TRANSTHORACIC ECHOCARDIOGRAM performed on 08/31/2020 Left ventricular ejection fraction, by estimation, is 50 to 55%. The left ventricle has low  normal function. The left ventricle has no regional wall motion abnormalities. There is mild concentric left ventricular hypertrophy. Left ventricular diastolic parameters are consistent with Grade I diastolic dysfunction (impaired relaxation).  Right ventricular systolic function is normal. The right ventricular size is normal.  The mitral valve is normal in structure. Trivial mitral valve regurgitation. No evidence of mitral stenosis.  The aortic valve is normal in structure. Aortic valve regurgitation is not visualized. No aortic stenosis is present.  The inferior vena cava is normal in size with greater than 50% respiratory variability, suggesting right atrial pressure of 3 mmHg.  Impression and Plan:  JAKIM MAZZOLA has been referred for pre-anesthesia review and clearance prior to him undergoing the planned anesthetic and procedural courses. Available labs, pertinent testing, and imaging results were personally reviewed by me. This patient has been appropriately cleared by cardiology with an overall ACCEPTABLE risk of significant perioperative cardiovascular complications.  Based on clinical review performed today (01/09/21), barring any significant acute changes in the patient's overall condition, it is anticipated that he will be able to proceed with the planned surgical intervention. Any acute changes in clinical condition may necessitate his procedure being postponed and/or cancelled. Patient will meet with anesthesia team (MD and/or CRNA) on the day of his procedure for preoperative evaluation/assessment. Questions regarding anesthetic course will be fielded at that time.   Pre-surgical instructions were reviewed with the patient during his PAT appointment and questions were fielded by PAT clinical staff. Patient was advised that if any questions or concerns arise prior to his procedure then he should return a call to PAT and/or his surgeon's office to discuss.  Honor Loh, MSN, APRN, FNP-C,  [facility]  Peri-operative Services Nurse Practitioner Phone: [phone] Fax: [phone] 01/09/21 1:30 PM  NOTE: This note has been prepared using Dragon dictation software. Despite my best ability to proofread, there is always the potential that unintentional transcriptional errors may still occur from this process.

## 2021-01-09 NOTE — Telephone Encounter (Signed)
   Primary Cardiologist: [name], MD  Chart reviewed as part of pre-operative protocol coverage. Given past medical history and time since last visit, based on ACC/AHA guidelines, PELE STUMPO would be at acceptable risk for the planned procedure without further cardiovascular testing.   Patient with diagnosis of afib on Xarelto for anticoagulation.     Procedure: BILATERAL FEMORAL ENDARTERECTOMIES  Date of procedure: 01/14/21     CHA2DS2-VASc Score = 2  This indicates a 2.2% annual risk of stroke. The patient's score is based upon: CHF History: No HTN History: Yes Diabetes History: No Stroke History: No Vascular Disease History: Yes Age Score: 0 Gender Score: 0       CrCl 119 ml/min   Per office protocol, patient can hold Xarelto for 2 days prior to procedure.  I will route this recommendation to the requesting party via Epic fax function and remove from pre-op pool.  Please call with questions.  Jossie Ng. Eira Alpert NP-C    01/09/2021, 9:00 AM [address] Office [phone] Fax [phone]

## 2021-01-13 ENCOUNTER — Other Ambulatory Visit: Payer: Self-pay

## 2021-01-13 ENCOUNTER — Other Ambulatory Visit
Admission: RE | Admit: 2021-01-13 | Discharge: 2021-01-13 | Disposition: A | Discharge status: Home / Self Care | Payer: 59 | Source: Ambulatory Visit | Attending: Vascular Surgery | Admitting: Vascular Surgery

## 2021-01-13 DIAGNOSIS — Z20822 Contact with and (suspected) exposure to covid-19: Secondary | ICD-10-CM | POA: Insufficient documentation

## 2021-01-13 DIAGNOSIS — Z01812 Encounter for preprocedural laboratory examination: Secondary | ICD-10-CM | POA: Insufficient documentation

## 2021-01-13 LAB — SARS CORONAVIRUS 2 (TAT 6-24 HRS): SARS Coronavirus 2: NEGATIVE

## 2021-01-14 ENCOUNTER — Inpatient Hospital Stay: Payer: 59 | Admitting: Urgent Care

## 2021-01-14 ENCOUNTER — Encounter
Admission: RE | Disposition: A | Discharge status: Home Health | Payer: Self-pay | Source: Home / Self Care | Attending: Vascular Surgery

## 2021-01-14 ENCOUNTER — Telehealth (INDEPENDENT_AMBULATORY_CARE_PROVIDER_SITE_OTHER): Payer: Self-pay | Admitting: Nurse Practitioner

## 2021-01-14 ENCOUNTER — Inpatient Hospital Stay
Admission: RE | Admit: 2021-01-14 | Discharge: 2021-01-15 | DRG: 253 | Disposition: A | Discharge status: Home Health | Payer: 59 | Attending: Vascular Surgery | Admitting: Vascular Surgery

## 2021-01-14 ENCOUNTER — Encounter: Payer: Self-pay | Admitting: Vascular Surgery

## 2021-01-14 DIAGNOSIS — Z20822 Contact with and (suspected) exposure to covid-19: Secondary | ICD-10-CM | POA: Diagnosis present

## 2021-01-14 DIAGNOSIS — Z8249 Family history of ischemic heart disease and other diseases of the circulatory system: Secondary | ICD-10-CM

## 2021-01-14 DIAGNOSIS — I10 Essential (primary) hypertension: Secondary | ICD-10-CM | POA: Diagnosis present

## 2021-01-14 DIAGNOSIS — I8393 Asymptomatic varicose veins of bilateral lower extremities: Secondary | ICD-10-CM | POA: Diagnosis present

## 2021-01-14 DIAGNOSIS — I70213 Atherosclerosis of native arteries of extremities with intermittent claudication, bilateral legs: Secondary | ICD-10-CM

## 2021-01-14 DIAGNOSIS — Z833 Family history of diabetes mellitus: Secondary | ICD-10-CM

## 2021-01-14 DIAGNOSIS — Z79899 Other long term (current) drug therapy: Secondary | ICD-10-CM

## 2021-01-14 DIAGNOSIS — I4819 Other persistent atrial fibrillation: Secondary | ICD-10-CM | POA: Diagnosis present

## 2021-01-14 DIAGNOSIS — Z7901 Long term (current) use of anticoagulants: Secondary | ICD-10-CM | POA: Diagnosis not present

## 2021-01-14 DIAGNOSIS — I70223 Atherosclerosis of native arteries of extremities with rest pain, bilateral legs: Secondary | ICD-10-CM | POA: Diagnosis present

## 2021-01-14 DIAGNOSIS — F17210 Nicotine dependence, cigarettes, uncomplicated: Secondary | ICD-10-CM | POA: Diagnosis present

## 2021-01-14 DIAGNOSIS — I70219 Atherosclerosis of native arteries of extremities with intermittent claudication, unspecified extremity: Secondary | ICD-10-CM | POA: Diagnosis present

## 2021-01-14 HISTORY — DX: Hypo-osmolality and hyponatremia: E87.1

## 2021-01-14 HISTORY — PX: ENDARTERECTOMY FEMORAL: SHX5804

## 2021-01-14 HISTORY — DX: Long term (current) use of anticoagulants: Z79.01

## 2021-01-14 HISTORY — DX: Atherosclerosis of aorta: I70.0

## 2021-01-14 LAB — CBC
HCT: 39 % (ref 39.0–52.0)
Hemoglobin: 13.7 g/dL (ref 13.0–17.0)
MCH: 33.1 pg (ref 26.0–34.0)
MCHC: 35.1 g/dL (ref 30.0–36.0)
MCV: 94.2 fL (ref 80.0–100.0)
Platelets: 236 10*3/uL (ref 150–400)
RBC: 4.14 MIL/uL — ABNORMAL LOW (ref 4.22–5.81)
RDW: 13.4 % (ref 11.5–15.5)
WBC: 13.1 10*3/uL — ABNORMAL HIGH (ref 4.0–10.5)
nRBC: 0 % (ref 0.0–0.2)

## 2021-01-14 LAB — GLUCOSE, CAPILLARY: Glucose-Capillary: 160 mg/dL — ABNORMAL HIGH (ref 70–99)

## 2021-01-14 LAB — CREATININE, SERUM
Creatinine, Ser: 0.51 mg/dL — ABNORMAL LOW (ref 0.61–1.24)
GFR, Estimated: >60 mL/min (ref >60)

## 2021-01-14 LAB — MRSA NEXT GEN BY PCR, NASAL: MRSA by PCR Next Gen: NOT DETECTED

## 2021-01-14 SURGERY — ENDARTERECTOMY, FEMORAL
Anesthesia: General | Site: Groin

## 2021-01-14 MED ORDER — LIDOCAINE HCL (PF) 2 % IJ SOLN
INTRAMUSCULAR | Status: AC
  Filled 2021-01-14: qty 5

## 2021-01-14 MED ORDER — ACETAMINOPHEN 10 MG/ML IV SOLN
INTRAVENOUS | Status: AC
  Filled 2021-01-14: qty 100

## 2021-01-14 MED ORDER — KETAMINE HCL 50 MG/5ML IJ SOSY
PREFILLED_SYRINGE | INTRAMUSCULAR | Status: AC
  Filled 2021-01-14: qty 5

## 2021-01-14 MED ORDER — HEPARIN SODIUM (PORCINE) 1000 UNIT/ML IJ SOLN
INTRAMUSCULAR | Status: AC
  Filled 2021-01-14: qty 1

## 2021-01-14 MED ORDER — OXYCODONE HCL 5 MG/5ML PO SOLN: 5.0000 mg | Freq: Once | ORAL | Status: DC | PRN

## 2021-01-14 MED ORDER — OXYCODONE-ACETAMINOPHEN 5-325 MG PO TABS
1.0000 | ORAL_TABLET | ORAL | Status: DC | PRN
  Administered 2021-01-15: 1 via ORAL
  Filled 2021-01-14: qty 1

## 2021-01-14 MED ORDER — METOPROLOL TARTRATE 25 MG PO TABS
25.0000 mg | ORAL_TABLET | Freq: Two times a day (BID) | ORAL | Status: DC
  Administered 2021-01-14: 25 mg via ORAL
  Filled 2021-01-14: qty 1

## 2021-01-14 MED ORDER — PHENYLEPHRINE HCL (PRESSORS) 10 MG/ML IV SOLN
INTRAVENOUS | Status: AC
  Filled 2021-01-14: qty 1

## 2021-01-14 MED ORDER — ACETAMINOPHEN 325 MG PO TABS
325.0000 mg | ORAL_TABLET | ORAL | Status: DC | PRN
Start: 2021-01-14 — End: 2021-01-15
  Administered 2021-01-14 – 2021-01-15 (×2): 650 mg via ORAL
  Filled 2021-01-14 (×2): qty 2

## 2021-01-14 MED ORDER — HYDRALAZINE HCL 20 MG/ML IJ SOLN
5.0000 mg | INTRAMUSCULAR | Status: DC | PRN
Start: 2021-01-14 — End: 2021-01-15
  Administered 2021-01-15: 5 mg via INTRAVENOUS
  Filled 2021-01-14: qty 1

## 2021-01-14 MED ORDER — MIDAZOLAM HCL 2 MG/2ML IJ SOLN
INTRAMUSCULAR | Status: DC | PRN
  Administered 2021-01-14: 2 mg via INTRAVENOUS

## 2021-01-14 MED ORDER — ACETAMINOPHEN 10 MG/ML IV SOLN
INTRAVENOUS | Status: DC | PRN
  Administered 2021-01-14: 1000 mg via INTRAVENOUS

## 2021-01-14 MED ORDER — LABETALOL HCL 5 MG/ML IV SOLN
10.0000 mg | INTRAVENOUS | Status: DC | PRN
  Filled 2021-01-14: qty 4

## 2021-01-14 MED ORDER — ENOXAPARIN SODIUM 40 MG/0.4ML IJ SOSY
40.0000 mg | PREFILLED_SYRINGE | INTRAMUSCULAR | Status: DC
  Administered 2021-01-15: 40 mg via SUBCUTANEOUS
  Filled 2021-01-14: qty 0.4

## 2021-01-14 MED ORDER — SUGAMMADEX SODIUM 200 MG/2ML IV SOLN
INTRAVENOUS | Status: DC | PRN
  Administered 2021-01-14: 200 mg via INTRAVENOUS

## 2021-01-14 MED ORDER — KETAMINE HCL 10 MG/ML IJ SOLN
INTRAMUSCULAR | Status: DC | PRN
  Administered 2021-01-14: 25 mg via INTRAVENOUS

## 2021-01-14 MED ORDER — FAMOTIDINE 20 MG PO TABS
ORAL_TABLET | ORAL | Status: AC
  Administered 2021-01-14: 20 mg via ORAL
  Filled 2021-01-14: qty 1

## 2021-01-14 MED ORDER — ONDANSETRON HCL 4 MG/2ML IJ SOLN: 4.0000 mg | Freq: Four times a day (QID) | INTRAMUSCULAR | Status: DC | PRN

## 2021-01-14 MED ORDER — EPHEDRINE 5 MG/ML INJ
INTRAVENOUS | Status: AC
  Filled 2021-01-14: qty 10

## 2021-01-14 MED ORDER — LIDOCAINE HCL (CARDIAC) PF 100 MG/5ML IV SOSY
PREFILLED_SYRINGE | INTRAVENOUS | Status: DC | PRN
  Administered 2021-01-14: 100 mg via INTRAVENOUS

## 2021-01-14 MED ORDER — METOPROLOL TARTRATE 50 MG PO TABS
50.0000 mg | ORAL_TABLET | Freq: Once | ORAL | Status: DC
  Filled 2021-01-14: qty 1

## 2021-01-14 MED ORDER — CHLORHEXIDINE GLUCONATE 0.12 % MT SOLN
OROMUCOSAL | Status: AC
  Administered 2021-01-14: 15 mL via OROMUCOSAL
  Filled 2021-01-14: qty 15

## 2021-01-14 MED ORDER — OXYCODONE HCL 5 MG PO TABS: 5.0000 mg | ORAL_TABLET | Freq: Once | ORAL | Status: DC | PRN

## 2021-01-14 MED ORDER — ROCURONIUM BROMIDE 100 MG/10ML IV SOLN
INTRAVENOUS | Status: DC | PRN
  Administered 2021-01-14: 20 mg via INTRAVENOUS
  Administered 2021-01-14: 50 mg via INTRAVENOUS
  Administered 2021-01-14: 20 mg via INTRAVENOUS
  Administered 2021-01-14 (×3): 50 mg via INTRAVENOUS
  Administered 2021-01-14: 20 mg via INTRAVENOUS

## 2021-01-14 MED ORDER — FENTANYL CITRATE (PF) 100 MCG/2ML IJ SOLN
INTRAMUSCULAR | Status: AC
  Filled 2021-01-14: qty 2

## 2021-01-14 MED ORDER — METOPROLOL TARTRATE 50 MG PO TABS: 50.0000 mg | ORAL_TABLET | Freq: Two times a day (BID) | ORAL | Status: DC

## 2021-01-14 MED ORDER — GLYCOPYRROLATE 0.2 MG/ML IJ SOLN
INTRAMUSCULAR | Status: DC | PRN
  Administered 2021-01-14 (×2): .1 mg via INTRAVENOUS

## 2021-01-14 MED ORDER — LACTATED RINGERS IV SOLN: INTRAVENOUS | Status: DC | PRN

## 2021-01-14 MED ORDER — METOPROLOL TARTRATE 50 MG PO TABS
75.0000 mg | ORAL_TABLET | Freq: Two times a day (BID) | ORAL | Status: DC
  Administered 2021-01-15: 75 mg via ORAL
  Filled 2021-01-14: qty 1

## 2021-01-14 MED ORDER — CEFAZOLIN SODIUM-DEXTROSE 1-4 GM/50ML-% IV SOLN
INTRAVENOUS | Status: AC
  Filled 2021-01-14: qty 50

## 2021-01-14 MED ORDER — ADULT MULTIVITAMIN W/MINERALS CH
1.0000 | ORAL_TABLET | Freq: Every day | ORAL | Status: DC
  Administered 2021-01-15: 1 via ORAL
  Filled 2021-01-14: qty 1

## 2021-01-14 MED ORDER — HEPARIN SODIUM (PORCINE) 5000 UNIT/ML IJ SOLN
INTRAMUSCULAR | Status: AC
  Filled 2021-01-14: qty 1

## 2021-01-14 MED ORDER — PHENOL 1.4 % MT LIQD
1.0000 | OROMUCOSAL | Status: DC | PRN
  Filled 2021-01-14: qty 177

## 2021-01-14 MED ORDER — CHLORHEXIDINE GLUCONATE 0.12 % MT SOLN: 15.0000 mL | Freq: Once | OROMUCOSAL | Status: AC

## 2021-01-14 MED ORDER — CHLORHEXIDINE GLUCONATE CLOTH 2 % EX PADS: 6.0000 | MEDICATED_PAD | Freq: Once | CUTANEOUS | Status: DC

## 2021-01-14 MED ORDER — SENNOSIDES-DOCUSATE SODIUM 8.6-50 MG PO TABS: 1.0000 | ORAL_TABLET | Freq: Every evening | ORAL | Status: DC | PRN

## 2021-01-14 MED ORDER — CHLORHEXIDINE GLUCONATE CLOTH 2 % EX PADS
6.0000 | MEDICATED_PAD | Freq: Every day | CUTANEOUS | Status: DC
  Administered 2021-01-15: 6 via TOPICAL

## 2021-01-14 MED ORDER — CEFAZOLIN SODIUM-DEXTROSE 1-4 GM/50ML-% IV SOLN
1.0000 g | INTRAVENOUS | Status: AC
  Administered 2021-01-14: 2 g via INTRAVENOUS

## 2021-01-14 MED ORDER — GUAIFENESIN-DM 100-10 MG/5ML PO SYRP: 15.0000 mL | ORAL_SOLUTION | ORAL | Status: DC | PRN

## 2021-01-14 MED ORDER — SODIUM CHLORIDE 0.9 % IV SOLN: 500.0000 mL | Freq: Once | INTRAVENOUS | Status: DC | PRN

## 2021-01-14 MED ORDER — PROPOFOL 10 MG/ML IV BOLUS
INTRAVENOUS | Status: AC
  Filled 2021-01-14: qty 20

## 2021-01-14 MED ORDER — VISTASEAL 10 ML SINGLE DOSE KIT
PACK | CUTANEOUS | Status: AC
  Filled 2021-01-14: qty 10

## 2021-01-14 MED ORDER — ASPIRIN EC 81 MG PO TBEC
81.0000 mg | DELAYED_RELEASE_TABLET | Freq: Every day | ORAL | Status: DC
  Administered 2021-01-15: 81 mg via ORAL
  Filled 2021-01-14: qty 1

## 2021-01-14 MED ORDER — NITROGLYCERIN IN D5W 200-5 MCG/ML-% IV SOLN: 5.0000 ug/min | INTRAVENOUS | Status: DC

## 2021-01-14 MED ORDER — SODIUM CHLORIDE 0.9 % IV SOLN
INTRAVENOUS | Status: DC | PRN
  Administered 2021-01-14: 500 mL via INTRAMUSCULAR

## 2021-01-14 MED ORDER — ALUM & MAG HYDROXIDE-SIMETH 200-200-20 MG/5ML PO SUSP: 15.0000 mL | ORAL | Status: DC | PRN

## 2021-01-14 MED ORDER — LACTATED RINGERS IV SOLN: INTRAVENOUS | Status: DC

## 2021-01-14 MED ORDER — 0.9 % SODIUM CHLORIDE (POUR BTL) OPTIME
TOPICAL | Status: DC | PRN
  Administered 2021-01-14: 500 mL

## 2021-01-14 MED ORDER — EPHEDRINE SULFATE 50 MG/ML IJ SOLN
INTRAMUSCULAR | Status: DC | PRN
  Administered 2021-01-14: 10 mg via INTRAVENOUS
  Administered 2021-01-14: 7.5 mg via INTRAVENOUS

## 2021-01-14 MED ORDER — MAGNESIUM SULFATE 2 GM/50ML IV SOLN
2.0000 g | Freq: Every day | INTRAVENOUS | Status: DC | PRN
  Filled 2021-01-14: qty 50

## 2021-01-14 MED ORDER — DEXAMETHASONE SODIUM PHOSPHATE 10 MG/ML IJ SOLN
INTRAMUSCULAR | Status: AC
  Filled 2021-01-14: qty 1

## 2021-01-14 MED ORDER — CEFAZOLIN SODIUM 1 G IJ SOLR
INTRAMUSCULAR | Status: AC
  Filled 2021-01-14: qty 10

## 2021-01-14 MED ORDER — ROCURONIUM BROMIDE 10 MG/ML (PF) SYRINGE
PREFILLED_SYRINGE | INTRAVENOUS | Status: AC
  Filled 2021-01-14: qty 10

## 2021-01-14 MED ORDER — FAMOTIDINE 20 MG PO TABS: 20.0000 mg | ORAL_TABLET | Freq: Once | ORAL | Status: AC

## 2021-01-14 MED ORDER — CEFAZOLIN SODIUM-DEXTROSE 2-4 GM/100ML-% IV SOLN
2.0000 g | Freq: Three times a day (TID) | INTRAVENOUS | Status: AC
  Administered 2021-01-14 – 2021-01-15 (×2): 2 g via INTRAVENOUS
  Filled 2021-01-14 (×2): qty 100

## 2021-01-14 MED ORDER — GLYCOPYRROLATE 0.2 MG/ML IJ SOLN
INTRAMUSCULAR | Status: AC
  Filled 2021-01-14: qty 1

## 2021-01-14 MED ORDER — ORAL CARE MOUTH RINSE: 15.0000 mL | Freq: Once | OROMUCOSAL | Status: AC

## 2021-01-14 MED ORDER — SODIUM CHLORIDE 0.9 % IV SOLN: INTRAVENOUS | Status: DC

## 2021-01-14 MED ORDER — HEMOSTATIC AGENTS (NO CHARGE) OPTIME
TOPICAL | Status: DC | PRN
  Administered 2021-01-14: 1 via TOPICAL
  Administered 2021-01-14: 2 via TOPICAL

## 2021-01-14 MED ORDER — PROMETHAZINE HCL 25 MG/ML IJ SOLN: 6.2500 mg | INTRAMUSCULAR | Status: DC | PRN

## 2021-01-14 MED ORDER — ONDANSETRON HCL 4 MG/2ML IJ SOLN
INTRAMUSCULAR | Status: DC | PRN
  Administered 2021-01-14: 4 mg via INTRAVENOUS

## 2021-01-14 MED ORDER — FAMOTIDINE 20 MG IN NS 100 ML IVPB
20.0000 mg | Freq: Two times a day (BID) | INTRAVENOUS | Status: DC
  Administered 2021-01-14 – 2021-01-15 (×2): 20 mg via INTRAVENOUS
  Filled 2021-01-14 (×3): qty 100

## 2021-01-14 MED ORDER — FAMOTIDINE IN NACL 20-0.9 MG/50ML-% IV SOLN
20.0000 mg | Freq: Two times a day (BID) | INTRAVENOUS | Status: DC
  Filled 2021-01-14 (×3): qty 50

## 2021-01-14 MED ORDER — GLUCOSAMINE CHOND MSM FORMULA PO TABS: ORAL_TABLET | Freq: Every day | ORAL | Status: DC

## 2021-01-14 MED ORDER — HEPARIN SODIUM (PORCINE) 1000 UNIT/ML IJ SOLN
INTRAMUSCULAR | Status: DC | PRN
  Administered 2021-01-14: 2000 [IU] via INTRAVENOUS
  Administered 2021-01-14: 7000 [IU] via INTRAVENOUS

## 2021-01-14 MED ORDER — DOPAMINE-DEXTROSE 3.2-5 MG/ML-% IV SOLN: 3.0000 ug/kg/min | INTRAVENOUS | Status: DC

## 2021-01-14 MED ORDER — FENTANYL CITRATE (PF) 100 MCG/2ML IJ SOLN: 25.0000 ug | INTRAMUSCULAR | Status: DC | PRN

## 2021-01-14 MED ORDER — DOCUSATE SODIUM 100 MG PO CAPS
100.0000 mg | ORAL_CAPSULE | Freq: Every day | ORAL | Status: DC
  Filled 2021-01-14: qty 1

## 2021-01-14 MED ORDER — SODIUM CHLORIDE 0.9 % IV SOLN
INTRAVENOUS | Status: DC | PRN
  Administered 2021-01-14: 10 ug/min via INTRAVENOUS

## 2021-01-14 MED ORDER — PROPOFOL 10 MG/ML IV BOLUS
INTRAVENOUS | Status: DC | PRN
  Administered 2021-01-14: 150 mg via INTRAVENOUS
  Administered 2021-01-14: 20 mg via INTRAVENOUS

## 2021-01-14 MED ORDER — SODIUM CHLORIDE 0.9 % IV SOLN
INTRAVENOUS | Status: DC | PRN
  Administered 2021-01-14 (×2): 1000 mL

## 2021-01-14 MED ORDER — ACETAMINOPHEN 325 MG RE SUPP
325.0000 mg | RECTAL | Status: DC | PRN
  Filled 2021-01-14: qty 2

## 2021-01-14 MED ORDER — POTASSIUM CHLORIDE CRYS ER 20 MEQ PO TBCR: 20.0000 meq | EXTENDED_RELEASE_TABLET | Freq: Every day | ORAL | Status: DC | PRN

## 2021-01-14 MED ORDER — SORBITOL 70 % SOLN
30.0000 mL | Freq: Every day | Status: DC | PRN
  Filled 2021-01-14: qty 30

## 2021-01-14 MED ORDER — ATORVASTATIN CALCIUM 10 MG PO TABS
10.0000 mg | ORAL_TABLET | Freq: Every day | ORAL | Status: DC
  Administered 2021-01-14 – 2021-01-15 (×2): 10 mg via ORAL
  Filled 2021-01-14: qty 1

## 2021-01-14 MED ORDER — VISTASEAL 10 ML SINGLE DOSE KIT
PACK | CUTANEOUS | Status: DC | PRN
  Administered 2021-01-14: 10 mL via TOPICAL

## 2021-01-14 MED ORDER — DEXAMETHASONE SODIUM PHOSPHATE 10 MG/ML IJ SOLN
INTRAMUSCULAR | Status: DC | PRN
  Administered 2021-01-14: 5 mg via INTRAVENOUS

## 2021-01-14 MED ORDER — MEPERIDINE HCL 25 MG/ML IJ SOLN: 6.2500 mg | INTRAMUSCULAR | Status: DC | PRN

## 2021-01-14 MED ORDER — DEXMEDETOMIDINE (PRECEDEX) IN NS 20 MCG/5ML (4 MCG/ML) IV SYRINGE
PREFILLED_SYRINGE | INTRAVENOUS | Status: DC | PRN
  Administered 2021-01-14 (×5): 4 ug via INTRAVENOUS
  Administered 2021-01-14: 8 ug via INTRAVENOUS
  Administered 2021-01-14: 4 ug via INTRAVENOUS

## 2021-01-14 MED ORDER — FENTANYL CITRATE (PF) 100 MCG/2ML IJ SOLN
INTRAMUSCULAR | Status: DC | PRN
  Administered 2021-01-14: 25 ug via INTRAVENOUS
  Administered 2021-01-14: 50 ug via INTRAVENOUS
  Administered 2021-01-14: 25 ug via INTRAVENOUS
  Administered 2021-01-14 (×2): 50 ug via INTRAVENOUS

## 2021-01-14 MED ORDER — MORPHINE SULFATE (PF) 2 MG/ML IV SOLN: 2.0000 mg | INTRAVENOUS | Status: DC | PRN

## 2021-01-14 MED ORDER — MIDAZOLAM HCL 2 MG/2ML IJ SOLN
INTRAMUSCULAR | Status: AC
  Filled 2021-01-14: qty 2

## 2021-01-14 MED ORDER — PHENYLEPHRINE HCL (PRESSORS) 10 MG/ML IV SOLN
INTRAVENOUS | Status: DC | PRN
  Administered 2021-01-14 (×2): 50 ug via INTRAVENOUS
  Administered 2021-01-14: 100 ug via INTRAVENOUS

## 2021-01-14 MED ORDER — METOPROLOL TARTRATE 5 MG/5ML IV SOLN: 2.0000 mg | INTRAVENOUS | Status: DC | PRN

## 2021-01-14 MED ORDER — ONDANSETRON HCL 4 MG/2ML IJ SOLN
INTRAMUSCULAR | Status: AC
  Filled 2021-01-14: qty 2

## 2021-01-14 SURGICAL SUPPLY — 75 items
ADH SKN CLS APL DERMABOND .7 (GAUZE/BANDAGES/DRESSINGS) ×4
APL PRP STRL LF DISP 70% ISPRP (MISCELLANEOUS) ×4
APPLIER CLIP 11 MED OPEN (CLIP)
APPLIER CLIP 9.375 SM OPEN (CLIP)
APR CLP MED 11 20 MLT OPN (CLIP)
APR CLP SM 9.3 20 MLT OPN (CLIP)
BAG DECANTER FOR FLEXI CONT (MISCELLANEOUS) ×3 IMPLANT
BAG ISL LRG 20X20 DRWSTRG (DRAPES)
BAG ISOLATATION DRAPE 20X20 ST (DRAPES) IMPLANT
BLADE SURG 15 STRL LF DISP TIS (BLADE) ×2 IMPLANT
BLADE SURG 15 STRL SS (BLADE) ×3
BLADE SURG SZ11 CARB STEEL (BLADE) ×3 IMPLANT
BOOT SUTURE AID YELLOW STND (SUTURE) ×4 IMPLANT
BRUSH SCRUB EZ  4% CHG (MISCELLANEOUS) ×3
BRUSH SCRUB EZ 4% CHG (MISCELLANEOUS) ×2 IMPLANT
CHLORAPREP W/TINT 26 (MISCELLANEOUS) ×4 IMPLANT
CLIP APPLIE 11 MED OPEN (CLIP) IMPLANT
CLIP APPLIE 9.375 SM OPEN (CLIP) IMPLANT
DERMABOND ADVANCED (GAUZE/BANDAGES/DRESSINGS) ×2
DERMABOND ADVANCED .7 DNX12 (GAUZE/BANDAGES/DRESSINGS) ×2 IMPLANT
DRAPE INCISE IOBAN 66X45 STRL (DRAPES) ×3 IMPLANT
DRAPE ISOLATE BAG 20X20 STRL (DRAPES)
DRSG OPSITE POSTOP 4X6 (GAUZE/BANDAGES/DRESSINGS) ×2 IMPLANT
ELECT CAUTERY BLADE 6.4 (BLADE) ×3 IMPLANT
ELECT REM PT RETURN 9FT ADLT (ELECTROSURGICAL) ×6
ELECTRODE REM PT RTRN 9FT ADLT (ELECTROSURGICAL) ×2 IMPLANT
GAUZE 4X4 16PLY ~~LOC~~+RFID DBL (SPONGE) ×2 IMPLANT
GLOVE SURG SYN 7.0 (GLOVE) ×18 IMPLANT
GLOVE SURG SYN 7.0 PF PI (GLOVE) ×4 IMPLANT
GLOVE SURG UNDER LTX SZ7.5 (GLOVE) ×7 IMPLANT
GOWN STRL REUS W/ TWL LRG LVL3 (GOWN DISPOSABLE) ×2 IMPLANT
GOWN STRL REUS W/ TWL XL LVL3 (GOWN DISPOSABLE) ×4 IMPLANT
GOWN STRL REUS W/TWL LRG LVL3 (GOWN DISPOSABLE) ×3
GOWN STRL REUS W/TWL XL LVL3 (GOWN DISPOSABLE) ×6
HEMOSTAT SURGICEL 2X3 (HEMOSTASIS) ×5 IMPLANT
IV NS 500ML (IV SOLUTION) ×3
IV NS 500ML BAXH (IV SOLUTION) ×2 IMPLANT
KIT TURNOVER KIT A (KITS) ×3 IMPLANT
LABEL OR SOLS (LABEL) ×3 IMPLANT
LOOP RED MAXI  1X406MM (MISCELLANEOUS) ×5
LOOP VESSEL MAXI  1X406 RED (MISCELLANEOUS) ×10
LOOP VESSEL MAXI 1X406 RED (MISCELLANEOUS) ×4 IMPLANT
LOOP VESSEL MINI 0.8X406 BLUE (MISCELLANEOUS) ×4 IMPLANT
LOOPS BLUE MINI 0.8X406MM (MISCELLANEOUS) ×6
MANIFOLD NEPTUNE II (INSTRUMENTS) ×3 IMPLANT
NDL SAFETY ECLIPSE 18X1.5 (NEEDLE) ×2 IMPLANT
NEEDLE HYPO 18GX1.5 SHARP (NEEDLE) ×3
NS IRRIG 500ML POUR BTL (IV SOLUTION) ×2 IMPLANT
PACK BASIN MAJOR ARMC (MISCELLANEOUS) ×3 IMPLANT
PACK UNIVERSAL (MISCELLANEOUS) ×3 IMPLANT
PATCH CAROTID ECM VASC 1X10 (Prosthesis & Implant Heart) ×2 IMPLANT
PENCIL ELECTRO HAND CTR (MISCELLANEOUS) IMPLANT
SET WALTER ACTIVATION W/DRAPE (SET/KITS/TRAYS/PACK) ×4 IMPLANT
SPONGE T-LAP 18X18 ~~LOC~~+RFID (SPONGE) ×6 IMPLANT
SUT MNCRL 4-0 (SUTURE) ×6
SUT MNCRL 4-0 27XMFL (SUTURE) ×4
SUT PROLENE 5 0 RB 1 DA (SUTURE) ×6 IMPLANT
SUT PROLENE 6 0 BV (SUTURE) ×26 IMPLANT
SUT PROLENE 7 0 BV 1 (SUTURE) ×11 IMPLANT
SUT SILK 2 0 (SUTURE) ×9
SUT SILK 2-0 18XBRD TIE 12 (SUTURE) ×2 IMPLANT
SUT SILK 3 0 (SUTURE) ×6
SUT SILK 3-0 18XBRD TIE 12 (SUTURE) ×2 IMPLANT
SUT SILK 4 0 (SUTURE) ×3
SUT SILK 4-0 18XBRD TIE 12 (SUTURE) ×2 IMPLANT
SUT VIC AB 2-0 CT1 27 (SUTURE) ×12
SUT VIC AB 2-0 CT1 TAPERPNT 27 (SUTURE) ×4 IMPLANT
SUT VIC AB 3-0 SH 27 (SUTURE) ×3
SUT VIC AB 3-0 SH 27X BRD (SUTURE) ×2 IMPLANT
SUT VICRYL+ 3-0 36IN CT-1 (SUTURE) ×7 IMPLANT
SUTURE MNCRL 4-0 27XMF (SUTURE) ×2 IMPLANT
SYR 20ML LL LF (SYRINGE) ×3 IMPLANT
SYR 5ML LL (SYRINGE) ×3 IMPLANT
TRAY FOLEY MTR SLVR 16FR STAT (SET/KITS/TRAYS/PACK) ×3 IMPLANT
WATER STERILE IRR 500ML POUR (IV SOLUTION) ×2 IMPLANT

## 2021-01-14 NOTE — Anesthesia Postprocedure Evaluation (Signed)
Anesthesia Post Note  Patient: Henry Hill  Procedure(s) Performed: ENDARTERECTOMY FEMORAL (Bilateral: Groin) APPLICATION OF CELL SAVER  Patient location during evaluation: PACU Anesthesia Type: General Level of consciousness: awake and alert Pain management: pain level controlled Vital Signs Assessment: post-procedure vital signs reviewed and stable Respiratory status: spontaneous breathing, nonlabored ventilation, respiratory function stable and patient connected to nasal cannula oxygen Cardiovascular status: blood pressure returned to baseline and stable Postop Assessment: no apparent nausea or vomiting Anesthetic complications: no   No notable events documented.   Last Vitals:  Vitals:   01/14/21 1815 01/14/21 1830  BP: 133/83 (!) 143/86  Pulse: 84 77  Resp: (!) 23 18  Temp: (!) 36.4 C 36.5 C  SpO2: 94% 92%    Last Pain:  Vitals:   01/14/21 1830  TempSrc: Oral  PainSc:                  [name]

## 2021-01-14 NOTE — Anesthesia Procedure Notes (Signed)
Procedure Name: Intubation Date/Time: 01/14/2021 1:25 PM Performed by: Bea Graff, RN Pre-anesthesia Checklist: Patient identified, Emergency Drugs available, Suction available and Patient being monitored Patient Re-evaluated:Patient Re-evaluated prior to induction Oxygen Delivery Method: Circle system utilized Preoxygenation: Pre-oxygenation with 100% oxygen Induction Type: IV induction Ventilation: Mask ventilation without difficulty Grade View: Grade I Tube type: Oral Tube size: 7.5 mm Number of attempts: 1 Airway Equipment and Method: Stylet and Oral airway Placement Confirmation: ETT inserted through vocal cords under direct vision, positive ETCO2 and breath sounds checked- equal and bilateral Secured at: 23 cm Tube secured with: Tape Dental Injury: Teeth and Oropharynx as per pre-operative assessment

## 2021-01-14 NOTE — Interval H&P Note (Signed)
History and Physical Interval Note:  01/14/2021 1:10 PM  Henry Hill  has presented today for surgery, with the diagnosis of ASO with claudication I70.219.  The various methods of treatment have been discussed with the patient and family. After consideration of risks, benefits and other options for treatment, the patient has consented to  Procedure(s) with comments: ENDARTERECTOMY FEMORAL (Bilateral) - Bilateral femoral endarterectomies APPLICATION OF CELL SAVER (N/A) as a surgical intervention.  The patient's history has been reviewed, patient examined, no change in status, stable for surgery.  I have reviewed the patient's chart and labs.  Questions were answered to the patient's satisfaction.     Leotis Pain

## 2021-01-14 NOTE — Anesthesia Procedure Notes (Signed)
Arterial Line Insertion Start/End9/11/2020 1:26 PM, 01/14/2021 1:27 PM Performed by: Iran Ouch, MD, Jaskirat Schwieger, Einar Grad, CRNA  Patient location: Pre-op. Preanesthetic checklist: patient identified, IV checked, site marked, risks and benefits discussed, surgical consent, monitors and equipment checked, pre-op evaluation, timeout performed and anesthesia consent Lidocaine 1% used for infiltration Left, radial was placed Catheter size: 20 G Hand hygiene performed  and maximum sterile barriers used   Attempts: 1 Procedure performed without using ultrasound guided technique. Following insertion, dressing applied. Post procedure assessment: normal and unchanged  Patient tolerated the procedure well with no immediate complications.

## 2021-01-14 NOTE — Op Note (Signed)
OPERATIVE NOTE   PROCEDURE: 1.   Left common femoral, profunda femoris, and superficial femoral artery endarterectomies 2.   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  significant plaque in bilateral common femoral, profunda femoris, and superficial femoral arteries  SPECIMEN(S):  Bilateral common femoral, profunda femoris, and superficial femoral artery plaque.  INDICATIONS:    Patient presents with disabling claudication symptoms bilaterally and near occlusive lesions in bilateral common femoral arteries in the proximal portions of both the profunda femoris artery and SFA on the right in the proximal portion of the SFA on the left.  Bilateral femoral endarterectomies are planned to try to improve perfusion.  The risks and benefits as well as alternative therapies including intervention were reviewed in detail all questions were answered the patient agrees to proceed with surgery.  DESCRIPTION: After obtaining full informed written consent, the patient was brought back to the operating room and placed supine upon the operating table.  The patient received IV antibiotics prior to induction.  After obtaining adequate anesthesia, the patient was prepped and draped in the standard fashion appropriate time out is called.    With myself working on the right and Dr. Delana Meyer working on the left we began by dissecting out the femoral arteries on each side. Vertical incisions were created overlying both femoral arteries. The common femoral artery proximally, and superficial femoral artery, and primary profunda femoris artery branches were encircled with vessel loops  and prepared for control. Both femoral arteries were found to have significant plaque from the common femoral artery into the profunda and superficial femoral arteries.  On the right, we got down to secondary profunda femoris artery branches and on the left we got down beyond branches of the SFA beyond the lesion   6000 units of heparin was given and allowed circulate for 5 minutes.   Attention is then turned to the profunda femoral artery.  An arteriotomy is made with 11 blade and extended with Potts scissors in the common femoral artery and carried down onto the first 3-4 cm of the profunda femoris artery down to just beyond the trifurcation after the initial branch. An endarterectomy was then performed. The [facility] was used to create a plane. The proximal endpoint was cut flush with tenotomy scissors and hemostats were used to pull the plaque up to the clamp. This was in the proximal common femoral artery. An eversion endarterectomy was then performed for the first 3 cm of the superficial femoral artery pulling out a large chunk of plaque that was easily palpable and getting beyond this lesion. Good backbleeding was then seen. The distal endpoint of the profunda femoris artery endarterectomy was created with gentle traction and the distal endpoint was tacked down with three 7-0 Prolene sutures.  The Cormatrix patcth is then selected and prepared for a patch angioplasty.  It is cut and beveled and started at the proximal endpoint with a 6-0 Prolene suture.  Approximately one half of the suture line is run medially and laterally and the distal end point was cut and bevelled to match the arteriotomy.  A second 6-0 Prolene was started at the distal end point and run to the mid portion to complete the arteriotomy.  The vessel was flushed  prior to release of control and completion of the anastomosis.  At this point, flow was established first to the profunda femoris artery and then to the superficial  femoral artery. Easily palpable pulses are noted well beyond the anastomosis and both arteries.  The left femoral artery is then addressed. Arteriotomy is made in the common femoral artery and extended down into the superficial femoral artery about 4 to 5 cm. Similarly, an endarterectomy was performed with the [facility]. The proximal endpoint was cut flush with tenotomy scissors in the proximal common femoral artery.  The initial portion of the profunda femoris artery which was somewhat diseased was addressed and treated with an eversion endarterectomy and this was performed with a hemostat and gentle traction. The arteriotomy was carried down onto the superficial femoral artery and the endarterectomy was continued to this point. The distal endpoint was taken just beyond the arteriotomy and then tacked down with four 7-0 Prolene sutures.  The profunda femoris artery was also tacked down with several 7-0 Prolene sutures. The Cormatrix extracellular patch was then brought onto the field.  It is cut and beveled and started at the proximal endpoint with a 6-0 Prolene suture.  Approximately one half of the suture line is run medially and laterally and the distal end point was cut and bevelled to match the arteriotomy.  A second 6-0 Prolene was started at the distal end point and run to the mid portion to complete the arteriotomy.   Flushing maneuvers were performed and flow was reestablished to the femoral vessels. Excellent pulses noted in the left superficial femoral and profunda femoris artery below the femoral anastomosis.  Surgicel and Evicel topical hemostatic agents were placed in the femoral incisions and hemostasis was complete. The femoral incisions were then closed in a layered fashion with 2 layers of 2-0 Vicryl, 2 layers of 3-0 Vicryl, and 4-0 Monocryl for the skin closure. Dermabond and sterile dressing were then placed over all incisions.  The patient was then awakened from anesthesia and  taken to the recovery room in stable condition having tolerated the procedure well.  COMPLICATIONS: None  CONDITION: Stable     Leotis Pain 01/14/2021 5:28 PM  This note was created with Dragon Medical transcription system. Any errors in dictation are purely unintentional.

## 2021-01-14 NOTE — Anesthesia Preprocedure Evaluation (Signed)
Anesthesia Evaluation  Patient identified by MRN, date of birth, ID band Patient awake    Reviewed: Allergy & Precautions, NPO status , Patient's Chart, lab work & pertinent test results  History of Anesthesia Complications Negative for: history of anesthetic complications  Airway Mallampati: II  TM Distance: >3 FB Neck ROM: Full    Dental  (+) Poor Dentition   Pulmonary neg COPD, Current Smoker and Patient abstained from smoking.,    breath sounds clear to auscultation- rhonchi (-) wheezing      Cardiovascular hypertension, Pt. on medications + Peripheral Vascular Disease  (-) CAD, (-) Past MI, (-) Cardiac Stents and (-) CABG Dysrhythmias: afib in setting of acute illness, resolved spontaneously.  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 08/2020 s/p ex lap & R colectomy. No date: Chronic anticoagulation     Comment:  Rivaroxaban No date: Diastolic dysfunction     Comment:  a.) 08/2020 Echo: EF 50-55%, no rwma, Gr1 DD, nl RV               size/fxn, triv MR. No date: Essential hypertension No date: ETOH abuse No date: Hypo-osmolality and hyponatremia No date: Persistent atrial fibrillation ([place])     Comment:  a.) Dx 08/2020 in setting of cecal volvulus s/p R               colectomy.  b.) CHA2DS'@VASc'$  =  2. No date: Tobacco abuse No date: Varicose veins of both lower extremities   Reproductive/Obstetrics                              Anesthesia Physical Anesthesia Plan  ASA: 3  Anesthesia Plan: General   Post-op Pain Management:    Induction: Intravenous  PONV Risk Score and Plan: 0 and Ondansetron  Airway Management Planned: Oral ETT  Additional Equipment: Arterial line  Intra-op Plan:   Post-operative Plan: Extubation in OR  Informed Consent: I have reviewed the patients History and Physical, chart, labs and discussed the procedure including the risks, benefits and alternatives for the proposed anesthesia with the patient or authorized representative who has indicated his/her understanding and acceptance.     Dental advisory given  Plan Discussed with: CRNA and Anesthesiologist  Anesthesia Plan Comments:         Anesthesia Quick Evaluation

## 2021-01-14 NOTE — Op Note (Signed)
OPERATIVE NOTE   PROCEDURE: Bilateral common femoral, superficial femoral and profunda femoris endarterectomy with Cormatrix patch angioplasty  PRE-OPERATIVE DIAGNOSIS: Atherosclerotic occlusive disease bilateral lower extremities with lifestyle limiting claudication and mild rest pain symptoms; hypertension  POST-OPERATIVE DIAGNOSIS: Same  CO-SURGEON: Katha Cabal, MD and [name], M.D.  ASSISTANT(S): None  ANESTHESIA: general  ESTIMATED BLOOD LOSS: 200 cc  FINDING(S): Profound calcific plaque noted bilaterally extending past the initial bifurcation of the profunda femoris arteries as well as down the extensive length of the SFA  SPECIMEN(S):  Calcific plaque from the common femoral, superficial femoral and the profunda femoris arteries bilaterally  INDICATIONS:   [name] 60 y.o. y.o.male who presents with complaints of lifestyle limiting claudication and pain continuously in the feet bilaterally. The patient has documented severe atherosclerotic occlusive disease and has undergone multiple minimally invasive treatments in the past. However, at this point his primary area of stricture stenosis resides in the common femoral and origins of the superficial femoral and profunda femoris extending into these arteries and therefore this is not amenable to intervention and he is now undergoing open endarterectomy. The risks and benefits of been reviewed with the patient, all questions have answered; alternative therapies have been reviewed as well and the patient has agreed to proceed with surgical open repair.  DESCRIPTION: After obtaining full informed written consent, the patient was brought back to the operating room and placed supine upon the operating table.  The patient received IV antibiotics prior to induction.  After obtaining adequate anesthesia, the patient was prepped and draped in the standard fashion for: bilateral femoral exposure.  Co-surgeons are required  because this is a bilateral procedure with work being performed simultaneously from both the right femoral and left femoral approach.  This also expedite the procedure making a shorter operative time reducing complications and improving patient safety.  Attention was turned to the bilateral groin with Dr. Lucky Cowboy working on the right and myself working on the left of the patient.  Vertical  incisions were made over the common femoral artery and dissected down to the common femoral artery with electrocautery.  I dissected out the common femoral artery from the distal external iliac artery (identified by the superficial circumflex vessels) down to the femoral bifurcation.  On initial inspection, the common femoral artery was: Densely calcified and there was no palpable pulse noted bilaterally.    Subsequently the dissection was continued to include all circumflex branches and the profunda femoral artery and superficial femoral artery. The superficial femoral artery was dissected circumferentially for a distance of approximately 3-4 cm and the profunda femoris was dissected circumferentially out to the fourth order branches individual vessel loops were placed around each branch. Both of the groins were treated simultaneously as described above. Control of all branches was obtained with vessel loops.  A softer area in the distal external iliac artery amendable to clamping was identified.  The patient was given 5000 units of Heparin intravenously, which was a therapeutic bolus.     After waiting 3 minutes, the right distal external iliac artery was clamped and placed all circumflex branches, and the profunda and superficial femoral arteries under tension.  Arteriotomy was made in the common femoral artery with a 11-blade and extended it with a Potts scissor proximally and distally extending the distal portion of the arteriotomy down the profunda for approximately 4 cm.   Endarterectomy was then performed under direct  visualization using a freer elevator and a right  angle from the mid common femoral extending up both proximally and distally. Proximally the endarterectomy was brought up to the level of the clamp where a clean edge was obtained. Distally the endarterectomy was carried down to a soft spot in the profunda where a feathered edge would was obtained.  7-0 Prolene interrupted tacking sutures were placed to secure the leading edge of the plaque in the profunda.  Essentially, we extended the endarterectomy down to the tertiary branches of the profunda femoris.  The superficial femoral artery was treated with an eversion technique extending endarterectomy approximately 3 cm distally again obtaining a featheredged.   At this point, we fashioned a core matrix patch for the geometry of the arteriotomy.  The patch was sewn to the artery with 2 running stitches of 6-0 Prolene, running from each end.  Prior to completing the patch angioplasty, the profunda femoral artery was flushed as was the superficial femoral artery. The system was then forward flushed. The endarterectomy site was then irrigated copiously with heparinized saline. The patch angioplasty was completed in the usual fashion.  Flow was then reestablished first to the profunda femoris and then the superficial femoral artery. Any gaps or bleeding sites in the suture line were easily controlled with a 6-0 Prolene suture.   Attention was then turned to the left groin and the right left external iliac artery was clamped and placed all circumflex branches, and the profunda and superficial femoral arteries under tension.  Arteriotomy was made in the common femoral artery with a 11-blade and extended it with a Potts scissor proximally and distally extending the distal portion of the arteriotomy down the superficial femoral artery for approximately 3 cm.   Endarterectomy was then performed under direct visualization using a freer elevator and a right angle from the  mid common femoral extending up both proximally and distally. Proximally the endarterectomy was brought up to the level of the clamp where a clean edge was obtained. Distally the endarterectomy was carried down to a soft spot in the superficial femoral artery where a feathered edge would was obtained.  7-0 Prolene interrupted tacking sutures were placed to secure the leading edge of the plaque in both the SFA and profunda.    The profunda femoris artery was treated with an eversion technique extending endarterectomy approximately 1-2 cm distally again obtaining a featheredged.  And tacking sutures were used as noted above  At this point, we fashioned a core matrix patch for the geometry of the arteriotomy.  The patch was sewn to the artery with 2 running stitches of 6-0 Prolene, running from each end.  Prior to completing the patch angioplasty, the profunda femoral artery was flushed as was the superficial femoral artery. The system was then forward flushed. The endarterectomy site was then irrigated copiously with heparinized saline. The patch angioplasty was completed in the usual fashion.  Flow was then reestablished first to the profunda femoris and then the superficial femoral artery. Any gaps or bleeding sites in the suture line were easily controlled with a 6-0 Prolene suture.   Both right and left groins were then irrigated copiously with sterile saline and subsequently Evicel and Surgicel were placed in the wound. The incision was repaired with a double layer of 2-0 Vicryl, a double layer of 3-0 Vicryl, and a layer of 4-0 Monocryl in a subcuticular fashion.  The skin was cleaned, dried, and reinforced with Dermabond.  COMPLICATIONS: None  CONDITION: Carlynn Purl, M.D. [facility]  Office: [phone]  01/14/2021, 6:12 PM

## 2021-01-14 NOTE — Transfer of Care (Signed)
Immediate Anesthesia Transfer of Care Note  Patient: Henry Hill  Procedure(s) Performed: ENDARTERECTOMY FEMORAL (Bilateral: Groin) APPLICATION OF CELL SAVER  Patient Location: PACU  Anesthesia Type:General  Level of Consciousness: drowsy  Airway & Oxygen Therapy: Patient Spontanous Breathing and Patient connected to face mask oxygen  Post-op Assessment: Report given to RN and Post -op Vital signs reviewed and stable  Post vital signs: Reviewed and stable  Last Vitals:  Vitals Value Taken Time  BP 136/87 01/14/21 1745  Temp    Pulse 95 01/14/21 1748  Resp 21 01/14/21 1748  SpO2 95 % 01/14/21 1748  Vitals shown include unvalidated device data.  Last Pain:  Vitals:   01/14/21 1111  TempSrc: Oral         Complications: No notable events documented.

## 2021-01-15 ENCOUNTER — Encounter: Payer: Self-pay | Admitting: Vascular Surgery

## 2021-01-15 LAB — CBC
HCT: 36.3 % — ABNORMAL LOW (ref 39.0–52.0)
Hemoglobin: 13.3 g/dL (ref 13.0–17.0)
MCH: 34 pg (ref 26.0–34.0)
MCHC: 36.6 g/dL — ABNORMAL HIGH (ref 30.0–36.0)
MCV: 92.8 fL (ref 80.0–100.0)
Platelets: 235 10*3/uL (ref 150–400)
RBC: 3.91 MIL/uL — ABNORMAL LOW (ref 4.22–5.81)
RDW: 13.2 % (ref 11.5–15.5)
WBC: 11.5 10*3/uL — ABNORMAL HIGH (ref 4.0–10.5)
nRBC: 0 % (ref 0.0–0.2)

## 2021-01-15 LAB — BASIC METABOLIC PANEL
Anion gap: 8 (ref 5–15)
BUN: 10 mg/dL (ref 6–20)
CO2: 21 mmol/L — ABNORMAL LOW (ref 22–32)
Calcium: 8.3 mg/dL — ABNORMAL LOW (ref 8.9–10.3)
Chloride: 98 mmol/L (ref 98–111)
Creatinine, Ser: 0.51 mg/dL — ABNORMAL LOW (ref 0.61–1.24)
GFR, Estimated: >60 mL/min (ref >60)
Glucose, Bld: 149 mg/dL — ABNORMAL HIGH (ref 70–99)
Potassium: 4.2 mmol/L (ref 3.5–5.1)
Sodium: 127 mmol/L — ABNORMAL LOW (ref 135–145)

## 2021-01-15 MED ORDER — OXYCODONE-ACETAMINOPHEN 5-325 MG PO TABS: 1.0000 | ORAL_TABLET | ORAL | 0 refills | Status: DC | PRN

## 2021-01-15 MED ORDER — ASPIRIN 81 MG PO TBEC
81.0000 mg | DELAYED_RELEASE_TABLET | Freq: Every day | ORAL | 11 refills | Status: DC
Start: 2021-01-16 — End: 2021-04-29

## 2021-01-15 NOTE — Progress Notes (Signed)
Physical Therapy Treatment Patient Details Name: Henry Hill MRN: [id number] DOB: 1961-04-02 Today's Date: 01/15/2021    History of Present Illness Henry Hill is a 59yoM who comes [facility] on 01/14/21 for vascular surgery to address ongoing claudication issue. Pt to OR c Dr. Lucky Cowboy and Dr. Delana Meyer for bilateral femoral endarterectomy. PMH:  essential HTN, ETOH abuse, AF on xarelto, tobacco use. Back surgery 2003, Left TKA >3ya). 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    PT Comments    Pt in recliner at entry, a few IVs DC since earlier in day, radial arterial line has also been DC. Pt has had tylenol, ready for AMB attempt. Pt tolerating STS transfers c RW at supervision level AMB 112f at supervision level with RW which helps with pain control, but pt still quite pain limited AEB gait speed 0.12m. Pt confident in ability to mobilize safely within the home at DC. Pt declines offer to attempt 2 entry steps prior to DC.      Follow Up Recommendations  Home health PT;Supervision for mobility/OOB     Equipment Recommendations  None recommended by PT (pt has a walker at home)    Recommendations for Other Services       Precautions / Restrictions Precautions Precautions: Fall Precaution Comments: Left radial A line Restrictions Weight Bearing Restrictions: No Other Position/Activity Restrictions: avoiding use of LUE for mobility at eval    Mobility  Bed Mobility Overal bed mobility: Needs Assistance Bed Mobility: Supine to Sit     Supine to sit: Min assist     General bed mobility comments: received in chair    Transfers Overall transfer level: Needs assistance Equipment used: Rolling walker (2 wheeled) Transfers: Sit to/from Stand Sit to Stand: Supervision Stand pivot transfers: Min guard          Ambulation/Gait Ambulation/Gait assistance: Supervision Gait Distance (Feet): 150 Feet Assistive device: Rolling walker (2 wheeled) Gait  Pattern/deviations: WFL(Within Functional Limits);Step-to pattern Gait velocity: 0.1135m  General Gait Details: small steps for pain control; Rt leading   Stairs Stairs:  (pt declines, reportedly no need)           Wheelchair Mobility    Modified Rankin (Stroke Patients Only)       Balance Overall balance assessment: Modified Independent;No apparent balance deficits (not formally assessed)                                          Cognition Arousal/Alertness: Awake/alert Behavior During Therapy: WFL for tasks assessed/performed Overall Cognitive Status: Within Functional Limits for tasks assessed                                        Exercises      General Comments        Pertinent Vitals/Pain Pain Assessment: 0-10 Pain Score: 4  Pain Location: Rt muscle soreness inferior to incision; left medial knee burning pain Pain Descriptors / Indicators: Burning Pain Intervention(s): Limited activity within patient's tolerance;Monitored during session    Home Living Family/patient expects to be discharged to:: Private residence Living Arrangements: Spouse/significant other Available Help at Discharge: Family Type of Home: House Home Access: Stairs to enter Entrance Stairs-Rails: Can reach both Home Layout: Two level Home Equipment: [employer]2  wheels      Prior Function Level of Independence: Independent          PT Goals (current goals can now be found in the care plan section) Acute Rehab PT Goals Patient Stated Goal: go home PT Goal Formulation: With patient Time For Goal Achievement: 01/29/21 Potential to Achieve Goals: Good Progress towards PT goals: Progressing toward goals    Frequency    Min 2X/week      PT Plan Current plan remains appropriate    Co-evaluation              AM-PAC PT "6 Clicks" Mobility   Outcome Measure  Help needed turning from your back to your side while in a flat bed without  using bedrails?: A Lot Help needed moving from lying on your back to sitting on the side of a flat bed without using bedrails?: A Lot Help needed moving to and from a bed to a chair (including a wheelchair)?: A Little Help needed standing up from a chair using your arms (e.g., wheelchair or bedside chair)?: A Little Help needed to walk in hospital room?: A Little Help needed climbing 3-5 steps with a railing? : A Little 6 Click Score: 16    End of Session Equipment Utilized During Treatment: Gait belt Activity Tolerance: Patient limited by pain;Patient tolerated treatment well Patient left: in chair;with call bell/phone within reach Nurse Communication: Mobility status PT Visit Diagnosis: Unsteadiness on feet (R26.81);Other abnormalities of gait and mobility (R26.89)     Time: [id number] PT Time Calculation (min) (ACUTE ONLY): 13 min  Charges:  $Gait Training: 8-22 mins $Therapeutic Activity: 8-22 mins                    1:19 PM, 01/15/21 Etta Grandchild, PT, DPT Physical Therapist - [facility]  [phone] (Zeb)      [name] 01/15/2021, 1:17 PM

## 2021-01-15 NOTE — Evaluation (Signed)
Occupational Therapy Evaluation Patient Details Name: PAMELA MADDY MRN: [id number] DOB: 09-30-1960 Today's Date: 01/15/2021    History of Present Illness Danish Ruffins is a 70yoM who comes to [facility] on 01/14/21 for vascular surgery to address ongoing claudication issue. Pt to OR c Dr. Lucky Cowboy and Dr. Delana Meyer for bilateral femoral endarterectomy. PMH:  essential HTN, ETOH abuse, AF on xarelto, tobacco use. Back surgery 2003, Left TKA >3ya). 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   Clinical Impression   Chart reviewed, RN cleared pt for participation in OT evaluation. Pt is greeted in bedside chair, agreeable to evaluation. PTA pt reports he is independent in ADL/IADL. His wife and father will be aavaible to assist as needed s/p discharge. Pt endorses 0/10 pain at rest, 6/10 pain with functional activity. Pt requires supervision for standing ADLs and fucntional mobility in room. BUE MMT, functional range of motion appears WFL. Pt endorses decreased sensation at B upper thighs. Pt provided education on safe ADL completion following discharge with current level of pain, mobility status. Pt accepted all information. Pt is left in bedside chair, NAD all needs met. Vital signs monitored and stable throguhout evalution. Pt presents with deficits in functional mobility, affecting safe ADL completion. Pt will benefit from skilled OT services while admitted to address functional deficits, no further OT recommended following disharge.     Follow Up Recommendations  Other (comment) (Pt will benefit from OT while admitted, no further OT recommended following discharge)    Equipment Recommendations  Tub/shower seat (pt educated on use of tub/shower seat PRN)    Recommendations for Other Services       Precautions / Restrictions Precautions Precautions: Fall Precaution Comments: Left radial A line Restrictions Weight Bearing Restrictions: No Other Position/Activity Restrictions: avoiding  use of LUE for mobility at eval      Mobility Bed Mobility      General bed mobility comments: received in chair    Transfers Overall transfer level: Needs assistance Equipment used: Rolling walker (2 wheeled) Transfers: Sit to/from [facility] Sit to Stand: Supervision Stand pivot transfers: Min guard            Balance Overall balance assessment: Modified Independent             ADL either performed or assessed with clinical judgement   ADL Overall ADL's : Needs assistance/impaired Eating/Feeding: Independent   Grooming: Supervision/safety;Standing                   Toilet Transfer: Magazine features editor Details (indicate cue type and reason): simulated, with gait belt         Functional mobility during ADLs: Min guard;Rolling walker General ADL Comments: 2-3 steps forward/backward 3x. Fair tolerance for brushing teeth in standing.     Vision   Additional Comments: appears WFL will continue to assess            Pertinent Vitals/Pain Pain Assessment: 0-10 Pain Score: 6  Pain Location: 0/10 pain at rest, reports L thigh burning during standing Pain Descriptors / Indicators: Burning Pain Intervention(s): Monitored during session;Other (comment) (RN reports tylenol given prior to evaluation)    Extremity/Trunk Assessment Upper Extremity Assessment Upper Extremity Assessment: Overall WFL for tasks assessed   Lower Extremity Assessment Lower Extremity Assessment: Generalized weakness;Overall WFL for tasks assessed       Communication Communication Communication: No difficulties   Cognition Arousal/Alertness: Awake/alert Behavior During Therapy: WFL for tasks assessed/performed Overall Cognitive  Status: Within Functional Limits for tasks assessed                  Home Living Family/patient expects to be discharged to:: Private residence Living Arrangements: Spouse/significant other Available Help at Discharge:  Family Type of Home: House Home Access: Stairs to enter Technical brewer of Steps: 2 Entrance Stairs-Rails: Can reach both Home Layout: Two level Alternate Level Stairs-Number of Steps: 13- pt reports he does not go to second floor. Bedroom/bathroom are on ground floor.             Home Equipment: Gilford Rile - 2 wheels          Prior Functioning/Environment Level of Independence: Independent        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        OT Problem List: Decreased activity tolerance      OT Treatment/Interventions: Self-care/ADL training;DME and/or AE instruction;Therapeutic activities;Patient/family education    OT Goals(Current goals can be found in the care plan section) Acute Rehab OT Goals Patient Stated Goal: go home OT Goal Formulation: With patient Potential to Achieve Goals: Good ADL Goals Pt Will Perform Grooming: standing;with modified independence Pt Will Perform Upper Body Dressing: Independently Pt Will Perform Lower Body Dressing: with modified independence  OT Frequency: Min 1X/week    AM-PAC OT "6 Clicks" Daily Activity     Outcome Measure Help from another person eating meals?: None Help from another person taking care of personal grooming?: A Little Help from another person toileting, which includes using toliet, bedpan, or urinal?: A Little Help from another person bathing (including washing, rinsing, drying)?: A Little Help from another person to put on and taking off regular upper body clothing?: None Help from another person to put on and taking off regular lower body clothing?: A Little 6 Click Score: 20   End of Session Equipment Utilized During Treatment: Gait belt;Rolling walker Nurse Communication: Mobility status  Activity Tolerance: Patient limited by pain Patient left: in chair;with call bell/phone within reach  OT Visit Diagnosis:  Unsteadiness on feet (R26.81);Other abnormalities of gait and mobility (R26.89)                Time: 6226-3335 OT Time Calculation (min): 20 min Charges:  OT General Charges $OT Visit: 1 Visit OT Evaluation $OT Eval Moderate Complexity: 1 Mod OT Treatments $Self Care/Home Management : 8-22 mins  Shanon Payor, OTD OTR/L  01/15/21, 11:30 AM

## 2021-01-15 NOTE — Discharge Summary (Signed)
[facility]    Discharge Summary    Patient ID:  Henry Hill MRN: [id number] DOB/AGE: Oct 26, 1960 60 y.o.  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 08/2020 s/p ex lap & R colectomy.   Chronic anticoagulation    Rivaroxaban   Diastolic dysfunction    a.) 08/2020 Echo: EF 50-55%, no rwma, Gr1 DD, nl RV size/fxn, triv MR.   Essential hypertension    ETOH abuse    Hypo-osmolality and hyponatremia    Persistent atrial fibrillation ([place])    a.) Dx 08/2020 in setting of cecal volvulus s/p R colectomy.  b.) CHA2DS'@VASc'$  =  2.   Tobacco abuse    Varicose veins of both lower extremities     Procedure(s): ENDARTERECTOMY FEMORAL APPLICATION OF CELL SAVER  Discharged Condition: good  HPI:  Patient with disabling BLE claudication  Hospital Course:  Henry Hill is a 60 y.o. male is S/P Bilateral Procedure(s): ENDARTERECTOMY FEMORAL APPLICATION OF CELL SAVER Extubated: POD # 0 Physical exam: feet warm Post-op wounds clean, dry, intact or healing well Pt. Ambulating, voiding and taking PO diet without difficulty. Pt pain controlled with PO pain meds. Labs as below Complications:none  Consults:    Significant Diagnostic Studies: CBC Lab Results  Component Value Date   WBC 11.5 (H) 01/15/2021   HGB 13.3 01/15/2021   HCT 36.3 (L) 01/15/2021   MCV 92.8 01/15/2021   PLT 235 01/15/2021    BMET    Component Value Date/Time   NA 127 (L) 01/15/2021 0343   NA 137 09/25/2020 1238   K 4.2 01/15/2021 0343   CL 98  01/15/2021 0343   CO2 21 (L) 01/15/2021 0343   GLUCOSE 149 (H) 01/15/2021 0343   BUN 10 01/15/2021 0343   BUN 11 09/25/2020 1238   CREATININE 0.51 (L) 01/15/2021 0343   CALCIUM 8.3 (L) 01/15/2021 0343   GFRNONAA >60 01/15/2021 0343   GFRAA 124 07/03/2019 0816   COAG Lab Results  Component Value Date   INR 0.9 08/30/2020     Disposition:  Discharge to :Home Discharge Instructions      Remove dressing in 72 hours   Complete by: As directed    Call MD for:  redness, tenderness, or signs of infection (pain, swelling, bleeding, redness, odor or green/yellow discharge around incision site)   Complete by: As directed    Call MD for:  severe or increased pain, loss or decreased feeling  in affected limb(s)   Complete by: As directed    Call MD for:  temperature >100.5   Complete by: As directed    Discharge instructions   Complete by: As directed    Out of work until returns for clinic visit   Driving Restrictions   Complete by: As directed    No driving for 48 hours   Lifting restrictions   Complete by: As directed    No lifting for 48 hours   Resume previous diet   Complete by: As directed       Allergies as of 01/15/2021   No Known Allergies      Medication  List     TAKE these medications    aspirin 81 MG EC tablet Take 1 tablet (81 mg total) by mouth daily at 6 (six) AM. Swallow whole. Start taking on: January 16, 2021   atorvastatin 10 MG tablet Commonly known as: LIPITOR Take 1 tablet (10 mg total) by mouth daily.   GLUCOSAMINE CHOND MSM FORMULA PO Take 1 tablet by mouth daily.   metoprolol tartrate 50 MG tablet Commonly known as: LOPRESSOR TAKE 1.5 TABLETS BY MOUTH 2 TIMES DAILY.   MULTIVITAMIN ADULT PO Take 1 tablet by mouth daily.   oxyCODONE-acetaminophen 5-325 MG tablet Commonly known as: PERCOCET/ROXICET Take 1-2 tablets by mouth every 4 (four) hours as needed for moderate pain.   rivaroxaban 20 MG Tabs tablet Commonly known as:  XARELTO Take 1 tablet (20 mg total) by mouth daily with supper.       Verbal and written Discharge instructions given to the patient. Wound care per Discharge AVS  Follow-up Information     Kris Hartmann, NP Follow up in 2 week(s).   Specialty: Vascular Surgery Why: For wound re-check Contact information: [place] [address] [phone]                 Signed: Leotis Pain, MD  01/15/2021, 3:01 PM

## 2021-01-15 NOTE — Evaluation (Signed)
Physical Therapy Evaluation Patient Details Name: Henry Hill MRN: [id number] DOB: 07/05/1960 Today's Date: 01/15/2021   History of Present Illness  Henry Hill is a 89yoM who comes to [facility] on 01/14/21 for vascular surgery to address ongoing claudication issue. Pt to OR c Dr. Lucky Cowboy and Dr. Delana Meyer for bilateral femoral endarterectomy. PMH:  essential HTN, ETOH abuse, AF on xarelto, tobacco use. Back surgery 2003, Left TKA >3ya). 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  Clinical Impression  Pt admitted with above diagnosis. Pt currently with functional limitations due to the deficits listed below (see "PT Problem List"). Upon entry, pt in bed, awake and agreeable to participate. Left A line still in place at time of visit, [name] moves to avoid use of LUE in mobility. The pt is alert, pleasant, interactive, and able to provide info regarding prior level of function, both in tolerance and independence. Although controlled at rest, pain severely limits mobility tolerance in general, pt unable to commence AMB trial prior to additional analgesics. MinA to get to EOB, very light MinA for STS and SPT to recliner. Pt tolerates sitting for>10 minutes, standing for >64mn, only 1 brief moment of concerning dizziness. WIll plan to return later in day for AMB assessment once pain is better controlled and A line has been removed. Patient's performance this date reveals decreased ability, independence, and tolerance in performing all basic mobility required for performance of activities of daily living. Pt requires additional DME, close physical assistance, and cues for safe participate in mobility. Pt will benefit from skilled PT intervention to increase independence and safety with basic mobility in preparation for discharge to the venue listed below.       Follow Up Recommendations Home health PT;Supervision for mobility/OOB    Equipment Recommendations  None recommended by PT (pt has a  walker at home)    Recommendations for Other Services       Precautions / Restrictions Precautions Precautions: Fall Precaution Comments: Left radial A line Restrictions Weight Bearing Restrictions: No Other Position/Activity Restrictions: avoiding use of LUE for mobility at eval      Mobility  Bed Mobility Overal bed mobility: Needs Assistance Bed Mobility: Supine to Sit     Supine to sit: Min assist     General bed mobility comments: pain with hip/trunk flexion    Transfers Overall transfer level: Needs assistance Equipment used: None;1 person hand held assist Transfers: Sit to/from [facility] to Stand: Min assist;From elevated surface Stand pivot transfers: Min assist;From elevated surface          Ambulation/Gait Ambulation/Gait assistance:  (eferred; pt able to stand for 2 minutes, but pain limits ability to commence AMB; called for analgesics)              Stairs            Wheelchair Mobility    Modified Rankin (Stroke Patients Only)       Balance Overall balance assessment: Modified Independent;No apparent balance deficits (not formally assessed)                                           Pertinent Vitals/Pain Pain Assessment: 0-10 Pain Score: 9  Pain Location: well controlled at rest; in standing has pain groin pain and left thigh numbness/burning pain. Pain Descriptors / Indicators: Shooting;Burning;Numbness Pain Intervention(s): Limited activity within  patient's tolerance;Monitored during session;Patient requesting pain meds-RN notified    Home Living Family/patient expects to be discharged to:: Private residence Living Arrangements: Spouse/significant other Available Help at Discharge: Family (wife adn Dad wil;l; be ab ailable.) Type of Home: House Home Access: Stairs to enter Entrance Stairs-Rails: Can reach both Entrance Stairs-Number of Steps: 2 Home Layout: One level Home Equipment:  Environmental consultant - 2 wheels      Prior Function Level of Independence: Independent         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     Hand Dominance        Extremity/Trunk Assessment   Upper Extremity Assessment Upper Extremity Assessment: Overall WFL for tasks assessed    Lower Extremity Assessment Lower Extremity Assessment: Overall WFL for tasks assessed;Generalized weakness       Communication      Cognition Arousal/Alertness: Awake/alert Behavior During Therapy: WFL for tasks assessed/performed Overall Cognitive Status: Within Functional Limits for tasks assessed                                        General Comments      Exercises     Assessment/Plan    PT Assessment Patient needs continued PT services  PT Problem List Decreased strength;Decreased range of motion;Decreased activity tolerance;Decreased mobility;Decreased knowledge of precautions;Decreased knowledge of use of DME;Impaired sensation       PT Treatment Interventions DME instruction;Gait training;Stair training;Functional mobility training;Therapeutic exercise;Patient/family education    PT Goals (Current goals can be found in the Care Plan section)  Acute Rehab PT Goals Patient Stated Goal: beter tolerate mobility without pain limitations PT Goal Formulation: With patient Time For Goal Achievement: 01/29/21 Potential to Achieve Goals: Good    Frequency Min 2X/week   Barriers to discharge        Co-evaluation               AM-PAC PT "6 Clicks" Mobility  Outcome Measure Help needed turning from your back to your side while in a flat bed without using bedrails?: A Lot Help needed moving from lying on your back to sitting on the side of a flat bed without using bedrails?: A Lot Help needed moving to and from a bed to a chair (including a wheelchair)?: A Lot Help needed standing up from  a chair using your arms (e.g., wheelchair or bedside chair)?: A Lot Help needed to walk in hospital room?: A Lot Help needed climbing 3-5 steps with a railing? : Total 6 Click Score: 11    End of Session   Activity Tolerance: Patient limited by pain Patient left: in chair;with call bell/phone within reach Nurse Communication: Mobility status PT Visit Diagnosis: Unsteadiness on feet (R26.81);Other abnormalities of gait and mobility (R26.89)    Time: [id number] PT Time Calculation (min) (ACUTE ONLY): 35 min   Charges:   PT Evaluation $PT Eval Low Complexity: 1 Low PT Treatments $Therapeutic Activity: 8-22 mins      9:27 AM, 01/15/21 Etta Grandchild, PT, DPT Physical Therapist - [facility]  [phone] ([place])    [place] 01/15/2021, 9:24 AM

## 2021-01-16 LAB — SURGICAL PATHOLOGY

## 2021-01-21 ENCOUNTER — Telehealth: Payer: Self-pay

## 2021-01-21 NOTE — Telephone Encounter (Signed)
Transition Care Management Follow-up Telephone Call Date of discharge and from where: 01/15/2021  [facility] How have you been since you were released from the hospital? Doing well, left leg swelling. Goes away with elevation Any questions or concerns? No  Items Reviewed: Did the pt receive and understand the discharge instructions provided? Yes Medications obtained and verified? Yes  Other? No  Any new allergies since your discharge? No  Dietary orders reviewed? Yes Do you have support at home? Yes   Home Care and Equipment/Supplies: Were home health services ordered? no If so, what is the name of the agency?  Has the agency set up a time to come to the patient's home? not applicable Were any new equipment or medical supplies ordered?  No What is the name of the medical supply agency?  Were you able to get the supplies/equipment? not applicable Do you have any questions related to the use of the equipment or supplies? No  Functional Questionnaire: (I = Independent and D = Dependent) ADLs: I  Bathing/Dressing- I  Meal Prep- I  Eating- I  Maintaining continence- I  Transferring/Ambulation- I  Managing Meds- I  Follow up appointments reviewed:  PCP Hospital f/u appt confirmed? No   Specialist Hospital f/u appt confirmed? Yes  Scheduled to see VASCULAR on 01/29/2021 @ 10AM. Are transportation arrangements needed? No  If their condition worsens, is the pt aware to call PCP or go to the Emergency Dept.? Yes Was the patient provided with contact information for the PCP's office or ED? Yes Was to pt encouraged to call back with questions or concerns? Yes   Tomasa Rand, RN, BSN, CEN [facility] [employer] [phone]

## 2021-01-23 ENCOUNTER — Telehealth (INDEPENDENT_AMBULATORY_CARE_PROVIDER_SITE_OTHER): Payer: Self-pay

## 2021-01-23 NOTE — Telephone Encounter (Signed)
Pt called and left a VM on the nurses line saying he is having swelling in his lt leg an wants to know can he be given a medication for it . He has a femoral endart on 01/14/21 performed by Dr. Lucky Cowboy. I spoke to the provider and he said to have the pt elevate and wear compression that swelling is normal after theses  procedures .pt called back and spoke to the front desk and they explained the provider instructions.

## 2021-01-26 ENCOUNTER — Telehealth (INDEPENDENT_AMBULATORY_CARE_PROVIDER_SITE_OTHER): Payer: Self-pay

## 2021-01-26 NOTE — Telephone Encounter (Signed)
The patient is scheduled to come in on 9/22. Let's see if we can get him in earlier with a LLE art duplex

## 2021-01-26 NOTE — Telephone Encounter (Signed)
Called and scheduled patient was only able to move appt up 1 day sooner

## 2021-01-26 NOTE — Telephone Encounter (Signed)
Pt called and left a VM on the nurses line saying he is having pain in his LLE the pt was last seen on 12/30/20 with Dr. 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 Please advise.

## 2021-01-27 ENCOUNTER — Other Ambulatory Visit (INDEPENDENT_AMBULATORY_CARE_PROVIDER_SITE_OTHER): Payer: Self-pay | Admitting: Nurse Practitioner

## 2021-01-27 DIAGNOSIS — M79605 Pain in left leg: Secondary | ICD-10-CM

## 2021-01-27 DIAGNOSIS — Z9889 Other specified postprocedural states: Secondary | ICD-10-CM

## 2021-01-27 DIAGNOSIS — I739 Peripheral vascular disease, unspecified: Secondary | ICD-10-CM

## 2021-01-28 ENCOUNTER — Ambulatory Visit (INDEPENDENT_AMBULATORY_CARE_PROVIDER_SITE_OTHER): Payer: 59

## 2021-01-28 ENCOUNTER — Ambulatory Visit (INDEPENDENT_AMBULATORY_CARE_PROVIDER_SITE_OTHER): Payer: 59 | Admitting: Nurse Practitioner

## 2021-01-28 ENCOUNTER — Other Ambulatory Visit: Payer: Self-pay

## 2021-01-28 VITALS — BP 178/90 | HR 77 | Ht 72.0 in | Wt 188.0 lb

## 2021-01-28 DIAGNOSIS — I10 Essential (primary) hypertension: Secondary | ICD-10-CM

## 2021-01-28 DIAGNOSIS — M7989 Other specified soft tissue disorders: Secondary | ICD-10-CM

## 2021-01-28 DIAGNOSIS — I739 Peripheral vascular disease, unspecified: Secondary | ICD-10-CM | POA: Diagnosis not present

## 2021-01-28 DIAGNOSIS — Z9889 Other specified postprocedural states: Secondary | ICD-10-CM | POA: Diagnosis not present

## 2021-01-28 DIAGNOSIS — M79605 Pain in left leg: Secondary | ICD-10-CM | POA: Diagnosis not present

## 2021-01-28 DIAGNOSIS — I70219 Atherosclerosis of native arteries of extremities with intermittent claudication, unspecified extremity: Secondary | ICD-10-CM

## 2021-01-28 MED ORDER — AMOXICILLIN-POT CLAVULANATE 875-125 MG PO TABS: 1.0000 | ORAL_TABLET | Freq: Two times a day (BID) | ORAL | 0 refills | Status: DC

## 2021-01-28 MED ORDER — CLOPIDOGREL BISULFATE 75 MG PO TABS: 75.0000 mg | ORAL_TABLET | Freq: Every day | ORAL | 6 refills | Status: DC

## 2021-01-29 ENCOUNTER — Telehealth (INDEPENDENT_AMBULATORY_CARE_PROVIDER_SITE_OTHER): Payer: Self-pay

## 2021-01-29 ENCOUNTER — Encounter (INDEPENDENT_AMBULATORY_CARE_PROVIDER_SITE_OTHER): Payer: 59 | Admitting: Nurse Practitioner

## 2021-01-29 NOTE — Telephone Encounter (Signed)
Unfortunately he is not, we will extend his time out for 3 weeks

## 2021-01-29 NOTE — Telephone Encounter (Signed)
Message was left on voicemail  from note below for ext [phone]

## 2021-02-04 ENCOUNTER — Ambulatory Visit (INDEPENDENT_AMBULATORY_CARE_PROVIDER_SITE_OTHER): Payer: 59 | Admitting: Nurse Practitioner

## 2021-02-04 ENCOUNTER — Other Ambulatory Visit: Payer: Self-pay

## 2021-02-04 ENCOUNTER — Encounter (INDEPENDENT_AMBULATORY_CARE_PROVIDER_SITE_OTHER): Payer: Self-pay

## 2021-02-04 VITALS — BP 163/91 | HR 78 | Resp 16 | Wt 184.0 lb

## 2021-02-04 DIAGNOSIS — M7989 Other specified soft tissue disorders: Secondary | ICD-10-CM | POA: Diagnosis not present

## 2021-02-04 NOTE — Progress Notes (Signed)
History of Present Illness  There is no documented history at this time  Assessments & Plan   There are no diagnoses linked to this encounter.    Additional instructions  Subjective:  Patient presents with venous ulcer of the Left lower extremity.    Procedure:  3 layer unna wrap was placed Left lower extremity.   Plan:   Follow up in one week.  

## 2021-02-06 ENCOUNTER — Encounter (INDEPENDENT_AMBULATORY_CARE_PROVIDER_SITE_OTHER): Payer: Self-pay | Admitting: Nurse Practitioner

## 2021-02-06 NOTE — Progress Notes (Signed)
Subjective:    Patient ID: Henry Hill, male    DOB: 05/17/1960, 60 y.o.   MRN: [id number] Chief Complaint  Patient presents with   Follow-up    2 wk post endarterectomy femoral wound check LLE arterial     Henry Hill is a 60 year old male that presents today for follow-up due to severe pain in his left lower extremity following femoral endarterectomy.  Patient with bilateral endarterectomy with noticeable swelling and pain and discomfort in his left leg.  The patient notes that it is difficult for him to place pressure on this leg.  He denies any open wound or ulceration however he does have severe reperfusion swelling.  Today noninvasive studies have shown triphasic waveforms in the common femoral deep femoral however at the proximal SFA transitions to monophasic waveforms.  There is no area of significant stenosis noted.   Review of Systems  Cardiovascular:  Positive for leg swelling.  Musculoskeletal:  Positive for gait problem.  All other systems reviewed and are negative.     Objective:   Physical Exam Vitals reviewed.  HENT:     Head: Normocephalic.  Cardiovascular:     Rate and Rhythm: Normal rate.     Pulses:          Dorsalis pedis pulses are 1+ on the left side.       Posterior tibial pulses are 1+ on the left side.  Pulmonary:     Effort: Pulmonary effort is normal.  Musculoskeletal:     Left lower leg: 3+ Edema present.  Skin:    General: Skin is warm and dry.  Neurological:     Mental Status: He is alert and oriented to person, place, and time.  Psychiatric:        Mood and Affect: Mood normal.        Behavior: Behavior normal.        Thought Content: Thought content normal.        Judgment: Judgment normal.    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 08/2020 s/p ex lap & R colectomy.   Chronic  anticoagulation    Rivaroxaban   Diastolic dysfunction    a.) 08/2020 Echo: EF 50-55%, no rwma, Gr1 DD, nl RV size/fxn, triv MR.   Essential hypertension    ETOH abuse    Hypo-osmolality and hyponatremia    Persistent atrial fibrillation ([place])    a.) Dx 08/2020 in setting of cecal volvulus s/p R colectomy.  b.) CHA2DS@VASc  =  2.   Tobacco abuse    Varicose veins of both lower extremities     Social History   Socioeconomic History   Marital status: Married    Spouse name: Not on file   Number of children: Not on file   Years of education: Not on file   Highest education level: Not on file  Occupational History   Not on file  Tobacco Use   Smoking status: Every Day    Packs/day: 0.50    Types: Cigarettes   Smokeless tobacco: Never  Vaping Use   Vaping Use: Never used  Substance and Sexual Activity   Alcohol use: Yes    Alcohol/week: 15.0 standard drinks    Types: 15 Cans of beer per week  Comment: MODERATE USE   Drug use: No   Sexual activity: Not on file  Other Topics Concern   Not on file  Social History Narrative   Not on file   Social Determinants of Health   Financial Resource Strain: Not on file  Food Insecurity: Not on file  Transportation Needs: Not on file  Physical Activity: Not on file  Stress: Not on file  Social Connections: Not on file  Intimate Partner Violence: Not on file    Past Surgical History:  Procedure Laterality Date   ABDOMINAL AORTOGRAM W/LOWER EXTREMITY N/A 12/10/2020   Procedure: ABDOMINAL AORTOGRAM W/LOWER EXTREMITY;  Surgeon: [name], MD;  Location: [place] [facility];  Service: Cardiovascular;  Laterality: N/A;   BACK SURGERY  05/10/2001   COLONOSCOPY WITH PROPOFOL N/A 11/24/2020   Procedure: COLONOSCOPY WITH PROPOFOL;  Surgeon: [name], MD;  [facility] END[facility]COPY;  Service: Gastroenterology;  Laterality: N/A;   ENDARTERECTOMY FEMORAL Bilateral 01/14/2021   Procedure: ENDARTERECTOMY FEMORAL;  Surgeon:  [name], MD;  Location: [facility];  Service: Vascular;  Laterality: Bilateral;  Bilateral femoral endarterectomies, COMMON, SFA, AND PROFUNDA  ARTERIES    HEMORRHOID SURGERY N/A 11/13/2014   Procedure: LEFT LATERAL HEMORRHOIDECTOMY  ;  Surgeon: [name], MD;  Location: [facility];  Service: General;  Laterality: N/A;   JOINT REPLACEMENT Left 2018   LAPAROTOMY Right 08/30/2020   Procedure: EXPLORATORY LAPAROTOMY WITH RIGHT COLOCTOMY;  Surgeon: [name], MD;  Location: [facility];  Service: General;  Laterality: Right;    Family History  Problem Relation Age of Onset   Heart attack Mother    Diabetes Father    Hypertension Father     No Known Allergies  CBC Latest Ref Rng & Units 01/15/2021 01/14/2021 01/09/2021  WBC 4.0 - 10.5 K/uL 11.5(H) 13.1(H) 12.2(H)  Hemoglobin 13.0 - 17.0 g/dL 13.3 13.7 13.8  Hematocrit 39.0 - 52.0 % 36.3(L) 39.0 39.5  Platelets 150 - 400 K/uL 235 236 277      CMP     Component Value Date/Time   NA 127 (L) 01/15/2021 0343   NA 137 09/25/2020 1238   K 4.2 01/15/2021 0343   CL 98 01/15/2021 0343   CO2 21 (L) 01/15/2021 0343   GLUCOSE 149 (H) 01/15/2021 0343   BUN 10 01/15/2021 0343   BUN 11 09/25/2020 1238   CREATININE 0.51 (L) 01/15/2021 0343   CALCIUM 8.3 (L) 01/15/2021 0343   PROT 5.5 (L) 09/01/2020 0618   PROT 6.7 07/03/2019 0816   ALBUMIN 2.7 (L) 09/01/2020 0618   ALBUMIN 4.5 07/03/2019 0816   AST 19 09/01/2020 0618   ALT 28 09/01/2020 0618   ALKPHOS 27 (L) 09/01/2020 0618   BILITOT 0.8 09/01/2020 0618   BILITOT 0.8 07/03/2019 0816   GFRNONAA >60 01/15/2021 0343   GFRAA 124 07/03/2019 0816     No results found.     Assessment & Plan:   1. 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  The patient does have monophasic waveforms from proximal SFA down.  However no severe stenosis is noted.  We will have the patient  return to the office in 2 weeks for reevaluation of pain.  2. Essential (primary) hypertension Continue antihypertensive medications as already ordered, these medications have been reviewed and there are no changes at this time.    3. Leg swelling Suspect therapy to the patient's leg.  Related to the severe swelling he is having in his left leg.   Current Outpatient Medications on File Prior to Visit  Medication Sig Dispense Refill   aspirin EC 81 MG EC tablet Take 1 tablet (81 mg total) by mouth daily at 6 (six) AM. Swallow whole. 30 tablet 11   atorvastatin (LIPITOR) 10 MG tablet Take 1 tablet (10 mg total) by mouth daily. 30 tablet 5   metoprolol tartrate (LOPRESSOR) 50 MG tablet TAKE 1.5 TABLETS BY MOUTH 2 TIMES DAILY. 90 tablet 3   Misc Natural Products (GLUCOSAMINE CHOND MSM FORMULA PO) Take 1 tablet by mouth daily.     Multiple Vitamins-Minerals (MULTIVITAMIN ADULT PO) Take 1 tablet by mouth daily.     oxyCODONE-acetaminophen (PERCOCET/ROXICET) 5-325 MG tablet Take 1-2 tablets by mouth every 4 (four) hours as needed for moderate pain. 30 tablet 0   No current facility-administered medications on file prior to visit.    There are no Patient Instructions on file for this visit. No follow-ups on file.   Kris Hartmann, NP

## 2021-02-08 ENCOUNTER — Encounter (INDEPENDENT_AMBULATORY_CARE_PROVIDER_SITE_OTHER): Payer: Self-pay | Admitting: Nurse Practitioner

## 2021-02-09 ENCOUNTER — Other Ambulatory Visit (INDEPENDENT_AMBULATORY_CARE_PROVIDER_SITE_OTHER): Payer: Self-pay | Admitting: Nurse Practitioner

## 2021-02-09 DIAGNOSIS — Z9889 Other specified postprocedural states: Secondary | ICD-10-CM

## 2021-02-09 DIAGNOSIS — I70213 Atherosclerosis of native arteries of extremities with intermittent claudication, bilateral legs: Secondary | ICD-10-CM

## 2021-02-10 ENCOUNTER — Encounter (INDEPENDENT_AMBULATORY_CARE_PROVIDER_SITE_OTHER): Payer: Self-pay | Admitting: Vascular Surgery

## 2021-02-10 ENCOUNTER — Ambulatory Visit (INDEPENDENT_AMBULATORY_CARE_PROVIDER_SITE_OTHER): Payer: 59

## 2021-02-10 ENCOUNTER — Ambulatory Visit (INDEPENDENT_AMBULATORY_CARE_PROVIDER_SITE_OTHER): Payer: 59 | Admitting: Vascular Surgery

## 2021-02-10 ENCOUNTER — Other Ambulatory Visit: Payer: Self-pay

## 2021-02-10 VITALS — BP 162/91 | HR 75 | Resp 16 | Wt 185.0 lb

## 2021-02-10 DIAGNOSIS — Z9889 Other specified postprocedural states: Secondary | ICD-10-CM

## 2021-02-10 DIAGNOSIS — I739 Peripheral vascular disease, unspecified: Secondary | ICD-10-CM

## 2021-02-10 DIAGNOSIS — I70219 Atherosclerosis of native arteries of extremities with intermittent claudication, unspecified extremity: Secondary | ICD-10-CM

## 2021-02-10 DIAGNOSIS — I70213 Atherosclerosis of native arteries of extremities with intermittent claudication, bilateral legs: Secondary | ICD-10-CM | POA: Diagnosis not present

## 2021-02-10 DIAGNOSIS — I10 Essential (primary) hypertension: Secondary | ICD-10-CM

## 2021-02-10 NOTE — Assessment & Plan Note (Signed)
blood pressure control important in reducing the progression of atherosclerotic disease. On appropriate oral medications.  

## 2021-02-10 NOTE — Assessment & Plan Note (Signed)
Symptoms seem to be much improved although he is still recovering from surgery.  ABIs today are up to 0.77 on the right and 0.8 on the left.  He has known SFA and popliteal disease bilaterally that we are not addressing currently, but can address in the future if he has residual lifestyle limiting symptoms in a few months.  I put him to return to work on 02/23/2021.  He can resume all normal activities as tolerated over the next couple of weeks.  We will plan to see him back in 3 months with ABIs.

## 2021-02-10 NOTE — Progress Notes (Signed)
Patient ID: Henry Hill, male   DOB: [birth year], 60 y.o.   MRN: [id number]  Chief Complaint  Patient presents with   Follow-up    Ultrasound follow up    HPI Henry Hill is a 60 y.o. male.  Patient returns in follow-up.  He is about a month status post bilateral femoral endarterectomies.  His swelling that was fairly prominent 2 weeks ago has essentially resolved.  He still has some discomfort on the inside of his legs but overall things are much improved and he is doing well.  He is still not full activity and will hopefully be ready to go back to work in about 2 weeks.   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 08/2020 s/p ex lap & R colectomy.   Chronic anticoagulation    Rivaroxaban   Diastolic dysfunction    a.) 08/2020 Echo: EF 50-55%, no rwma, Gr1 DD, nl RV size/fxn, triv MR.   Essential hypertension    ETOH abuse    Hypo-osmolality and hyponatremia    Persistent atrial fibrillation ([place])    a.) Dx 08/2020 in setting of cecal volvulus s/p R colectomy.  b.) CHA2DS@VASc  =  2.   Tobacco abuse    Varicose veins of both lower extremities     Past Surgical History:  Procedure Laterality Date   ABDOMINAL AORTOGRAM W/LOWER EXTREMITY N/A 12/10/2020   Procedure: ABDOMINAL AORTOGRAM W/LOWER EXTREMITY;  Surgeon: [name], MD;  Location: [place] [facility];  Service: Cardiovascular;  Laterality: N/A;   BACK SURGERY  05/10/2001   COLONOSCOPY WITH PROPOFOL N/A 11/24/2020   Procedure: COLONOSCOPY WITH PROPOFOL;  Surgeon: [name], MD;  [facility];  Service: Gastroenterology;  Laterality: N/A;   ENDARTERECTOMY FEMORAL Bilateral 01/14/2021   Procedure: ENDARTERECTOMY FEMORAL;  Surgeon: [name], MD;  Location: [facility];  Service: Vascular;  Laterality: Bilateral;  Bilateral femoral endarterectomies, COMMON, SFA, AND PROFUNDA  ARTERIES    HEMORRHOID SURGERY N/A 11/13/2014   Procedure: LEFT  LATERAL HEMORRHOIDECTOMY  ;  Surgeon: [name], MD;  Location: [facility];  Service: General;  Laterality: N/A;   JOINT REPLACEMENT Left 2018   LAPAROTOMY Right 08/30/2020   Procedure: EXPLORATORY LAPAROTOMY WITH RIGHT COLOCTOMY;  Surgeon: [name], MD;  Location: [facility];  Service: General;  Laterality: Right;      No Known Allergies  Current Outpatient Medications  Medication Sig Dispense Refill   aspirin EC 81 MG EC tablet Take 1 tablet (81 mg total) by mouth daily at 6 (six) AM. Swallow whole. 30 tablet 11   atorvastatin (LIPITOR) 10 MG tablet Take 1 tablet (10 mg total) by mouth daily. 30 tablet 5   clopidogrel (PLAVIX) 75 MG tablet Take 1 tablet (75 mg total) by mouth daily. 30 tablet 6   metoprolol tartrate (LOPRESSOR) 50 MG tablet TAKE 1.5 TABLETS BY MOUTH 2 TIMES DAILY. 90 tablet 3   Misc Natural Products (GLUCOSAMINE CHOND MSM FORMULA PO) Take 1 tablet by mouth daily.     Multiple Vitamins-Minerals (MULTIVITAMIN ADULT PO) Take 1 tablet by mouth daily.     oxyCODONE-acetaminophen (PERCOCET/ROXICET) 5-325 MG tablet Take 1-2 tablets by mouth every 4 (four) hours as needed for moderate pain. 30 tablet 0   amoxicillin-clavulanate (AUGMENTIN) 875-125 MG tablet Take 1 tablet by mouth 2 (two) times daily. (Patient not taking: Reported on 02/10/2021) 20 tablet 0   No current  facility-administered medications for this visit.        Physical Exam BP (!) 162/91 (BP Location: Right Arm)   Pulse 75   Resp 16   Wt 185 lb (83.9 kg)   BMI 25.09 kg/m  Gen:  WD/WN, NAD Skin: incision C/D/I.  Slight scab present at the upper portion of the left groin wound.  Swelling in the legs is minimal at this point.     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  ABIs today are up to 0.77 on the right and 0.8 on the left.  He has known SFA and popliteal  disease bilaterally that we are not addressing currently, but can address in the future if he has residual lifestyle limiting symptoms in a few months.  I put him to return to work on 02/23/2021.  He can resume all normal activities as tolerated over the next couple of weeks.  We will plan to see him back in 3 months with ABIs.  Essential (primary) hypertension blood pressure control important in reducing the progression of atherosclerotic disease. On appropriate oral medications.      Leotis Pain 02/10/2021, 1:08 PM   This note was created with Dragon medical transcription system.  Any errors from dictation are unintentional.

## 2021-02-19 NOTE — Telephone Encounter (Signed)
documentation

## 2021-02-24 ENCOUNTER — Other Ambulatory Visit: Payer: Self-pay | Admitting: Nurse Practitioner

## 2021-02-26 ENCOUNTER — Other Ambulatory Visit: Payer: Self-pay

## 2021-02-26 ENCOUNTER — Ambulatory Visit (INDEPENDENT_AMBULATORY_CARE_PROVIDER_SITE_OTHER): Payer: 59 | Admitting: Nurse Practitioner

## 2021-02-26 ENCOUNTER — Encounter (INDEPENDENT_AMBULATORY_CARE_PROVIDER_SITE_OTHER): Payer: Self-pay | Admitting: Nurse Practitioner

## 2021-02-26 VITALS — BP 161/82 | HR 85 | Resp 16 | Wt 190.6 lb

## 2021-02-26 DIAGNOSIS — T8130XA Disruption of wound, unspecified, initial encounter: Secondary | ICD-10-CM

## 2021-02-26 MED ORDER — AMOXICILLIN-POT CLAVULANATE 875-125 MG PO TABS
1.0000 | ORAL_TABLET | Freq: Two times a day (BID) | ORAL | 0 refills | Status: DC
Start: 2021-02-26 — End: 2021-04-29

## 2021-03-04 ENCOUNTER — Encounter (INDEPENDENT_AMBULATORY_CARE_PROVIDER_SITE_OTHER): Payer: Self-pay | Admitting: Nurse Practitioner

## 2021-03-04 NOTE — Progress Notes (Signed)
Subjective:    Patient ID: Henry Hill, male    DOB: [birth year], 60 y.o.   MRN: [id number] Chief Complaint  Patient presents with  . Follow-up    Post op incision site non healing    Henry Hill is a 60 year old male presents today for issues with wound healing following his recent left endarterectomy.  The patient is elevated difficulty with wanting to have resolved however.  He has bilateral lower EXTR wound VAC to his groin.  The area is covered with slough.  There is no odor no signs symptoms of infection however the wound healing is delayed.  He denies any fevers or chills.  The patient is also back to work.  He denies any other wounds or ulcerations.  Review of Systems  Skin:  Positive for wound.      Objective:   Physical Exam Vitals reviewed.  HENT:     Head: Normocephalic.  Cardiovascular:     Rate and Rhythm: Normal rate.     Pulses: Normal pulses.  Pulmonary:     Effort: Pulmonary effort is normal.  Musculoskeletal:     Left lower leg: Edema present.  Skin:      Neurological:     Mental Status: He is alert and oriented to person, place, and time.  Psychiatric:        Mood and Affect: Mood normal.        Behavior: Behavior normal.        Thought Content: Thought content normal.        Judgment: Judgment normal.    BP (!) 161/82 (BP Location: Right Arm)   Pulse 85   Resp 16   Wt 190 lb 9.6 oz (86.5 kg)   BMI 25.85 kg/m   Past Medical History:  Diagnosis Date  . Aortic atherosclerosis ([place])   . Cecal volvulus ([place])    a. 08/2020 s/p ex lap & R colectomy.  . Chronic anticoagulation    Rivaroxaban  . Diastolic dysfunction    a.) 08/2020 Echo: EF 50-55%, no rwma, Gr1 DD, nl RV size/fxn, triv MR.  . Essential hypertension   . ETOH abuse   . Hypo-osmolality and hyponatremia   . Persistent atrial fibrillation ([place])    a.) Dx 08/2020 in setting of cecal volvulus s/p R colectomy.  b.) CHA2DS@VASc  =  2.  . Tobacco abuse   . Varicose veins of both lower  extremities     Social History   Socioeconomic History  . Marital status: Married    Spouse name: Not on file  . Number of children: Not on file  . Years of education: Not on file  . Highest education level: Not on file  Occupational History  . Not on file  Tobacco Use  . Smoking status: Every Day    Packs/day: 0.50    Types: Cigarettes  . Smokeless tobacco: Never  Vaping Use  . Vaping Use: Never used  Substance and Sexual Activity  . Alcohol use: Yes    Alcohol/week: 15.0 standard drinks    Types: 15 Cans of beer per week    Comment: MODERATE USE  . Drug use: No  . Sexual activity: Not on file  Other Topics Concern  . Not on file  Social History Narrative  . Not on file   Social Determinants of Health   Financial Resource Strain: Not on file  Food Insecurity: Not on file  Transportation Needs: Not on file  Physical Activity: Not on file  Stress: Not on file  Social Connections: Not on file  Intimate Partner Violence: Not on file    Past Surgical History:  Procedure Laterality Date  . ABDOMINAL AORTOGRAM W/LOWER EXTREMITY N/A 12/10/2020   Procedure: ABDOMINAL AORTOGRAM W/LOWER EXTREMITY;  Surgeon: [name], MD;  Location: [place] [facility];  Service: Cardiovascular;  Laterality: N/A;  . BACK SURGERY  05/10/2001  . COLONOSCOPY WITH PROPOFOL N/A 11/24/2020   Procedure: COLONOSCOPY WITH PROPOFOL;  Surgeon: [name], MD;  Location: [facility] ENDOSCOPY;  Service: Gastroenterology;  Laterality: N/A;  . ENDARTERECTOMY FEMORAL Bilateral 01/14/2021   Procedure: ENDARTERECTOMY FEMORAL;  Surgeon: [name], MD;  Location: [facility];  Service: Vascular;  Laterality: Bilateral;  Bilateral femoral endarterectomies, COMMON, SFA, AND PROFUNDA  ARTERIES   . HEMORRHOID SURGERY N/A 11/13/2014   Procedure: LEFT LATERAL HEMORRHOIDECTOMY  ;  Surgeon: [name], MD;  Location: [facility];  Service: General;  Laterality: N/A;  . JOINT REPLACEMENT Left 2018  .  LAPAROTOMY Right 08/30/2020   Procedure: EXPLORATORY LAPAROTOMY WITH RIGHT COLOCTOMY;  Surgeon: [name], MD;  Location: [facility];  Service: General;  Laterality: Right;    Family History  Problem Relation Age of Onset  . Heart attack Mother   . Diabetes Father   . Hypertension Father     No Known Allergies  CBC Latest Ref Rng & Units 01/15/2021 01/14/2021 01/09/2021  WBC 4.0 - 10.5 K/uL 11.5(H) 13.1(H) 12.2(H)  Hemoglobin 13.0 - 17.0 g/dL 13.3 13.7 13.8  Hematocrit 39.0 - 52.0 % 36.3(L) 39.0 39.5  Platelets 150 - 400 K/uL 235 236 277      CMP     Component Value Date/Time   NA 127 (L) 01/15/2021 0343   NA 137 09/25/2020 1238   K 4.2 01/15/2021 0343   CL 98 01/15/2021 0343   CO2 21 (L) 01/15/2021 0343   GLUCOSE 149 (H) 01/15/2021 0343   BUN 10 01/15/2021 0343   BUN 11 09/25/2020 1238   CREATININE 0.51 (L) 01/15/2021 0343   CALCIUM 8.3 (L) 01/15/2021 0343   PROT 5.5 (L) 09/01/2020 0618   PROT 6.7 07/03/2019 0816   ALBUMIN 2.7 (L) 09/01/2020 0618   ALBUMIN 4.5 07/03/2019 0816   AST 19 09/01/2020 0618   ALT 28 09/01/2020 0618   ALKPHOS 27 (L) 09/01/2020 0618   BILITOT 0.8 09/01/2020 0618   BILITOT 0.8 07/03/2019 0816   GFRNONAA >60 01/15/2021 0343   GFRAA 124 07/03/2019 0816     VAS [place] ABI WITH/WO TBI  Result Date: 02/20/2021  [place] ST[facility] Patient Name:  Henry Hill  Date of Exam:   02/10/2021 Medical Rec #: [id number]      Accession #:    [id number] Date of Birth: 1960-12-25      Patient Gender: M Patient Age:   47 years Exam Location:  [facility] Procedure:      VAS [place] ABI WITH/WO TBI Referring Phys: Eulogio Ditch --------------------------------------------------------------------------------  Indications: Peripheral artery disease.  Vascular Interventions: 12/10/2020: Abdominal Aortogram/Lower Extremity.                          01/14/2021: Left CFA,PFA and SFA Endartectomy. Right                         CFA,PFA and SFA Endartectomy.  Performing Technologist: Almira Coaster RVS  Examination Guidelines: A complete evaluation includes at minimum, Doppler waveform signals  and systolic blood pressure reading at the level of bilateral brachial, anterior tibial, and posterior tibial arteries, when vessel segments are accessible. Bilateral testing is considered an integral part of a complete examination. Photoelectric Plethysmograph (PPG) waveforms and toe systolic pressure readings are included as required and additional duplex testing as needed. Limited examinations for reoccurring indications may be performed as noted.  ABI Findings: +---------+------------------+-----+--------+--------+ Right    Rt Pressure (mmHg)IndexWaveformComment  +---------+------------------+-----+--------+--------+ Brachial 160                                     +---------+------------------+-----+--------+--------+ ATA      118                                     +---------+------------------+-----+--------+--------+ PTA      126               0.77                  +---------+------------------+-----+--------+--------+ Great Toe95                0.58 Abnormal         +---------+------------------+-----+--------+--------+ +---------+------------------+-----+--------+-------+ Left     Lt Pressure (mmHg)IndexWaveformComment +---------+------------------+-----+--------+-------+ Brachial 164                                    +---------+------------------+-----+--------+-------+ ATA      131                                    +---------+------------------+-----+--------+-------+ PTA      144               0.88                 +---------+------------------+-----+--------+-------+ Great Toe103               0.63 Abnormal        +---------+------------------+-----+--------+-------+ +-------+-----------+-----------+------------+------------+ ABI/TBIToday's ABIToday's TBIPrevious ABIPrevious TBI  +-------+-----------+-----------+------------+------------+ Right  .77        .58                                 +-------+-----------+-----------+------------+------------+ Left   .88        .63                                 +-------+-----------+-----------+------------+------------+  Summary: Right: Resting right ankle-brachial index indicates moderate right lower extremity arterial disease. The right toe-brachial index is abnormal. Left: Resting left ankle-brachial index indicates mild left lower extremity arterial disease. The left toe-brachial index is abnormal.  *See table(s) above for measurements and observations.  Electronically signed by Leotis Pain MD on 02/20/2021 at 8:53:56 AM.    Final        Assessment & Plan:   1. Wound dehiscence The patient has Hydrofera Blue at home.  We will use this is in the area of dehiscence.  We discussed the need for debridement in the we will change her tactics once we see wound bed tissue.  The patient is advised to  change this every 2 to 3 days.  We will have the patient return in approximately 2 weeks for wound reevaluation.   Current Outpatient Medications on File Prior to Visit  Medication Sig Dispense Refill  . aspirin EC 81 MG EC tablet Take 1 tablet (81 mg total) by mouth daily at 6 (six) AM. Swallow whole. 30 tablet 11  . clopidogrel (PLAVIX) 75 MG tablet Take 1 tablet (75 mg total) by mouth daily. 30 tablet 6  . metoprolol tartrate (LOPRESSOR) 50 MG tablet TAKE 1.5 TABLETS BY MOUTH 2 TIMES DAILY. 270 tablet 0  . Misc Natural Products (GLUCOSAMINE CHOND MSM FORMULA PO) Take 1 tablet by mouth daily.    . Multiple Vitamins-Minerals (MULTIVITAMIN ADULT PO) Take 1 tablet by mouth daily.    Marland Kitchen oxyCODONE-acetaminophen (PERCOCET/ROXICET) 5-325 MG tablet Take 1-2 tablets by mouth every 4 (four) hours as needed for moderate pain. 30 tablet 0  . atorvastatin (LIPITOR) 10 MG tablet Take 1 tablet (10 mg total) by mouth daily. 30 tablet 5   No  current facility-administered medications on file prior to visit.    There are no Patient Instructions on file for this visit. No follow-ups on file.   Kris Hartmann, NP

## 2021-03-11 ENCOUNTER — Telehealth (INDEPENDENT_AMBULATORY_CARE_PROVIDER_SITE_OTHER): Payer: Self-pay | Admitting: Nurse Practitioner

## 2021-03-11 NOTE — Telephone Encounter (Signed)
Documentation only.

## 2021-03-12 ENCOUNTER — Ambulatory Visit (INDEPENDENT_AMBULATORY_CARE_PROVIDER_SITE_OTHER): Payer: 59 | Admitting: Nurse Practitioner

## 2021-03-12 ENCOUNTER — Encounter (INDEPENDENT_AMBULATORY_CARE_PROVIDER_SITE_OTHER): Payer: Self-pay

## 2021-03-24 ENCOUNTER — Other Ambulatory Visit: Payer: Self-pay

## 2021-03-24 ENCOUNTER — Encounter: Payer: 59 | Attending: Physician Assistant | Admitting: Physician Assistant

## 2021-03-24 DIAGNOSIS — I872 Venous insufficiency (chronic) (peripheral): Secondary | ICD-10-CM | POA: Diagnosis not present

## 2021-03-24 DIAGNOSIS — L98492 Non-pressure chronic ulcer of skin of other sites with fat layer exposed: Secondary | ICD-10-CM | POA: Insufficient documentation

## 2021-03-25 NOTE — Progress Notes (Signed)
JAHI, ROZA ([id number]) Visit Report for 03/24/2021 Chief Complaint Document Details Patient Name: Henry Hill, Henry Hill. Date of Service: 03/24/2021 12:45 PM Medical Record Number: [id number] Patient Account Number: [id number] Date of Birth/Sex: [birth year] (60 y.o. M) Treating RN: Cornell Barman Primary Care Provider: SYSTEM, PCP Other Clinician: Referring Provider: Referral, Self Treating Provider/Extender: Skipper Cliche in Treatment: 0 Information Obtained from: Patient Chief Complaint Left groin ulcer s/p surgery Electronic Signature(s) Signed: 03/24/2021 1:32:40 PM By: Worthy Keeler PA-C Previous Signature: 03/24/2021 1:30:33 PM Version By: Worthy Keeler PA-C Entered By: Worthy Keeler on 03/24/2021 13:32:40 Henry Hill ([id number]) -------------------------------------------------------------------------------- HPI Details Patient Name: Henry Hill Date of Service: 03/24/2021 12:45 PM Medical Record Number: [id number] Patient Account Number: [id number] Date of Birth/Sex: [birth year] (60 y.o. M) Treating RN: Cornell Barman Primary Care Provider: SYSTEM, PCP Other Clinician: Referring Provider: Referral, Self Treating Provider/Extender: Skipper Cliche in Treatment: 0 History of Present Illness HPI Description: 09/15/2020 upon evaluation today patient appears to be doing somewhat poorly in regard to the wound that he tells me has been dealing with for about a year. Has been working with his primary care provider to try to help get this wound healed. He comes in today as he states that they had "failed" and getting this to completely close. Fortunately there does not appear to be any signs of active infection he did have a lot of eschar and dried drainage on the surface of the wound area. With that being said we were able to carefully remove this today I did not even have to perform debridement. This appears to be doing quite well once removed. He does have a history of chronic  venous insufficiency, peripheral vascular disease, atrial fibrillation, long-term use of anticoagulant therapy which has recently been started with Eliquis due to the fact that he was diagnosed with A. fib when he was in the hospital due to a bowel obstruction. He also has hypertension. 09/22/2020 upon evaluation today patient appears to be doing well with regard to his wound. He has been tolerating the dressing changes without complication. Fortunately there is no signs of active infection at this time. No fevers, chills, nausea, vomiting, or diarrhea. 5/23; patient with a wound on the left medial lower extremity. History of significant chronic venous insufficiency apparently has had procedures done by Dr. Lucky Cowboy he also had an ABI in this clinic of 0.59 he has limiting claudication although I could not quantify this. He is going for noninvasive arterial studies but this is being delayed as he apparently is having a cardiac stress test related to recent atrial fibrillation. He is using collagen 10/07/2020 upon evaluation today patient appears to be doing well in regard to his ankle ulcer. In fact this appears to be doing significantly better and is much smaller. He is going require some sharp debridement but again this is really not significantly problematic as I believe he is for the most part completely healed underneath the majority the area that I want to remove some of the necrotic debris. This seems to be more drainage and dry skin than anything. 10/21/2020 upon evaluation today patient appears to be doing well with regard to his wound. He has been tolerating the/40 dressing changes without complication. Fortunately there is no signs of active infection at this time. 11/04/2020 upon evaluation today patient's wound actually showed signs of improvement. I am actually very pleased with where things stand. There was some dried drainage when I remove this most of  everything underneath was completely  healed. There is just a very small opening it still very painful for him. He did have arterial studies that showed that he had ABIs bilaterally of 0.67. On the right he had an TBI of 0.46 and on the left a TBI of 0.40. There is moderate peripheral vascular disease noted as such. He does have an appointment with the cardiologist tomorrow actually. They will be discussing these results and what to do going forward. 11/18/2020 upon evaluation today patient appears to be doing well with regard to his wounds in fact everything appears to be showing signs of excellent improvement in regard to the medial ankle wound at this time. I do not see any evidence of infection which is great news and overall I think that the patient is making excellent progress. Fortunately there does not appear to be any signs of active infection at this time that is great news as well. In fact I think he appears to be completely healed. Readmission: 03/24/2022 patient presents for readmission I previously saw him due to wounds that he had over his lower extremity this was back from May to July of 2022. Subsequently he has done much better he tells me with regard to pain in his legs following the intervention which was a lower extremity bypass. Nonetheless he had a dehiscence of the upper portion of the surgical scar that he has been treating with some of the collagen he had leftover from when I was taking care of him previously. That seems to have done well in the past when he had [facility] for this area he tells me it was not doing nearly as good. Nonetheless I do believe that we can help him out with getting him some additional medication as well as cover dressings at this point. I do believe that as a surgical wound this is healing quite nicely even after the dehiscence. Electronic Signature(s) Signed: 03/24/2021 1:54:10 PM By: Worthy Keeler PA-C Entered By: Worthy Keeler on 03/24/2021 13:54:10 Henry Hill  ([id number]) -------------------------------------------------------------------------------- Physical Exam Details Patient Name: Henry Hill, Henry Hill. Date of Service: 03/24/2021 12:45 PM Medical Record Number: [id number] Patient Account Number: [id number] Date of Birth/Sex: 11-Aug-1960 (60 y.o. M) Treating RN: Cornell Barman Primary Care Provider: SYSTEM, PCP Other Clinician: Referring Provider: Referral, Self Treating Provider/Extender: Skipper Cliche in Treatment: 0 Constitutional Well-nourished and well-hydrated in no acute distress. Respiratory normal breathing without difficulty. Psychiatric this patient is able to make decisions and demonstrates good insight into disease process. Alert and Oriented x 3. pleasant and cooperative. Notes Patient's wound bed actually showed signs of good granulation epithelization at this point for the most part I do not see any significant slough buildup this could require sharp debridement at this point that is good news. I did use saline and gauze and a sterile Q-tip to clean away the surface of the wound there is a little bit of bleeding but everything appears to be extremely healthy. Chart Electronic Signature(s) Signed: 03/24/2021 1:54:42 PM By: Worthy Keeler PA-C Entered By: Worthy Keeler on 03/24/2021 13:54:42 Henry Hill ([id number]) -------------------------------------------------------------------------------- Physician Orders Details Patient Name: Henry Hill, Henry Hill. Date of Service: 03/24/2021 12:45 PM Medical Record Number: [id number] Patient Account Number: [id number] Date of Birth/Sex: [birth year] (60 y.o. M) Treating RN: Cornell Barman Primary Care Provider: SYSTEM, PCP Other Clinician: Referring Provider: Referral, Self Treating Provider/Extender: Skipper Cliche in Treatment: 0 Verbal / Phone Orders: No Diagnosis Coding ICD-10 Coding Code Description T81.31XA  Disruption of external operation (surgical) wound, not elsewhere  classified, initial encounter L98.492 Non-pressure chronic ulcer of skin of other sites with fat layer exposed I87.2 Venous insufficiency (chronic) (peripheral) I48.0 Paroxysmal atrial fibrillation Z79.01 Long term (current) use of anticoagulants I10 Essential (primary) hypertension Follow-up Appointments o Return Appointment in 2 weeks. Bathing/ Shower/ Hygiene o May shower; gently cleanse wound with antibacterial soap, rinse and pat dry prior to dressing wounds Wound Treatment Wound #2 - Groin Wound Laterality: Left Cleanser: Soap and Water 3 x Per Week/30 Days Discharge Instructions: Gently cleanse wound with antibacterial soap, rinse and pat dry prior to dressing wounds Primary Dressing: Prisma 4.34 (in) (DME) (Generic) 3 x Per Week/30 Days Discharge Instructions: Moisten w/normal saline or sterile water; Cover wound as directed. Do not remove from wound bed. Secondary Dressing: Mepilex Border Flex, 4x4 (in/in) (DME) (Generic) 3 x Per Week/30 Days Discharge Instructions: Apply to wound as directed. Do not cut. Electronic Signature(s) Signed: 03/24/2021 4:51:13 PM By: Worthy Keeler PA-C Signed: 03/25/2021 4:58:16 PM By: Gretta Cool, BSN, RN, CWS, Kim RN, BSN Entered By: Gretta Cool, BSN, RN, CWS, Kim on 03/24/2021 13:41:44 Henry Hill ([id number]) -------------------------------------------------------------------------------- Problem List Details Patient Name: Henry Hill, Henry Hill. Date of Service: 03/24/2021 12:45 PM Medical Record Number: [id number] Patient Account Number: [id number] Date of Birth/Sex: 25-Jul-1960 (60 y.o. M) Treating RN: Cornell Barman Primary Care Provider: SYSTEM, PCP Other Clinician: Referring Provider: Referral, Self Treating Provider/Extender: Skipper Cliche in Treatment: 0 Active Problems ICD-10 Encounter Code Description Active Date MDM Diagnosis T81.31XA Disruption of external operation (surgical) wound, not elsewhere 03/24/2021 No Yes classified, initial  encounter L98.492 Non-pressure chronic ulcer of skin of other sites with fat layer exposed 03/24/2021 No Yes I87.2 Venous insufficiency (chronic) (peripheral) 03/24/2021 No Yes I48.0 Paroxysmal atrial fibrillation 03/24/2021 No Yes Z79.01 Long term (current) use of anticoagulants 03/24/2021 No Yes I10 Essential (primary) hypertension 03/24/2021 No Yes Inactive Problems Resolved Problems Electronic Signature(s) Signed: 03/24/2021 1:30:21 PM By: Worthy Keeler PA-C Entered By: Worthy Keeler on 03/24/2021 13:30:21 Henry Hill ([id number]) -------------------------------------------------------------------------------- Progress Note Details Patient Name: Henry Hill. Date of Service: 03/24/2021 12:45 PM Medical Record Number: [id number] Patient Account Number: [id number] Date of Birth/Sex: 1960-10-15 (60 y.o. M) Treating RN: Cornell Barman Primary Care Provider: SYSTEM, PCP Other Clinician: Referring Provider: Referral, Self Treating Provider/Extender: Skipper Cliche in Treatment: 0 Subjective Chief Complaint Information obtained from Patient Left groin ulcer s/p surgery History of Present Illness (HPI) 09/15/2020 upon evaluation today patient appears to be doing somewhat poorly in regard to the wound that he tells me has been dealing with for about a year. Has been working with his primary care provider to try to help get this wound healed. He comes in today as he states that they had "failed" and getting this to completely close. Fortunately there does not appear to be any signs of active infection he did have a lot of eschar and dried drainage on the surface of the wound area. With that being said we were able to carefully remove this today I did not even have to perform debridement. This appears to be doing quite well once removed. He does have a history of chronic venous insufficiency, peripheral vascular disease, atrial fibrillation, long-term use of anticoagulant therapy which  has recently been started with Eliquis due to the fact that he was diagnosed with A. fib when he was in the hospital due to a bowel obstruction. He also has hypertension. 09/22/2020 upon evaluation today patient appears  to be doing well with regard to his wound. He has been tolerating the dressing changes without complication. Fortunately there is no signs of active infection at this time. No fevers, chills, nausea, vomiting, or diarrhea. 5/23; patient with a wound on the left medial lower extremity. History of significant chronic venous insufficiency apparently has had procedures done by Dr. Lucky Cowboy he also had an ABI in this clinic of 0.59 he has limiting claudication although I could not quantify this. He is going for noninvasive arterial studies but this is being delayed as he apparently is having a cardiac stress test related to recent atrial fibrillation. He is using collagen 10/07/2020 upon evaluation today patient appears to be doing well in regard to his ankle ulcer. In fact this appears to be doing significantly better and is much smaller. He is going require some sharp debridement but again this is really not significantly problematic as I believe he is for the most part completely healed underneath the majority the area that I want to remove some of the necrotic debris. This seems to be more drainage and dry skin than anything. 10/21/2020 upon evaluation today patient appears to be doing well with regard to his wound. He has been tolerating the/40 dressing changes without complication. Fortunately there is no signs of active infection at this time. 11/04/2020 upon evaluation today patient's wound actually showed signs of improvement. I am actually very pleased with where things stand. There was some dried drainage when I remove this most of everything underneath was completely healed. There is just a very small opening it still very painful for him. He did have arterial studies that showed that he  had ABIs bilaterally of 0.67. On the right he had an TBI of 0.46 and on the left a TBI of 0.40. There is moderate peripheral vascular disease noted as such. He does have an appointment with the cardiologist tomorrow actually. They will be discussing these results and what to do going forward. 11/18/2020 upon evaluation today patient appears to be doing well with regard to his wounds in fact everything appears to be showing signs of excellent improvement in regard to the medial ankle wound at this time. I do not see any evidence of infection which is great news and overall I think that the patient is making excellent progress. Fortunately there does not appear to be any signs of active infection at this time that is great news as well. In fact I think he appears to be completely healed. Readmission: 03/24/2022 patient presents for readmission I previously saw him due to wounds that he had over his lower extremity this was back from May to July of 2022. Subsequently he has done much better he tells me with regard to pain in his legs following the intervention which was a lower extremity bypass. Nonetheless he had a dehiscence of the upper portion of the surgical scar that he has been treating with some of the collagen he had leftover from when I was taking care of him previously. That seems to have done well in the past when he had [facility] for this area he tells me it was not doing nearly as good. Nonetheless I do believe that we can help him out with getting him some additional medication as well as cover dressings at this point. I do believe that as a surgical wound this is healing quite nicely even after the dehiscence. Patient History Information obtained from Patient. Allergies No Known Allergies Social History Current every  day smoker, Marital Status - Married, Alcohol Use - Daily, Drug Use - No History, Caffeine Use - Never. Medical History Eyes Denies history of Cataracts, Glaucoma,  Optic Neuritis Ear/Nose/Mouth/Throat Denies history of Chronic sinus problems/congestion, Middle ear problems Hematologic/Lymphatic Denies history of Anemia, Hemophilia, Human Immunodeficiency Virus, Lymphedema, Sickle Cell Disease Hill, Henry C. ([id number]) Respiratory Denies history of Aspiration, Asthma, Chronic Obstructive Pulmonary Disease (COPD), Pneumothorax, Sleep Apnea, Tuberculosis Cardiovascular Patient has history of Arrhythmia, Hypertension Denies history of Congestive Heart Failure, Coronary Artery Disease, Deep Vein Thrombosis, Hypotension, Myocardial Infarction, Peripheral Arterial Disease, Peripheral Venous Disease, Phlebitis, Vasculitis Gastrointestinal Denies history of Cirrhosis , Colitis, Crohn s, Hepatitis A, Hepatitis B, Hepatitis C Endocrine Denies history of Type I Diabetes, Type II Diabetes Genitourinary Denies history of End Stage Renal Disease Immunological Denies history of Lupus Erythematosus, Raynaud s, Scleroderma Integumentary (Skin) Denies history of History of Burn, History of pressure wounds Musculoskeletal Denies history of Gout, Rheumatoid Arthritis, Osteoarthritis, Osteomyelitis Neurologic Denies history of Dementia, Neuropathy, Quadriplegia, Paraplegia, Seizure Disorder Oncologic Denies history of Received Chemotherapy, Received Radiation Psychiatric Denies history of Anorexia/bulimia, Confinement Anxiety Objective Constitutional Well-nourished and well-hydrated in no acute distress. Vitals Time Taken: 1:05 PM, Height: 72 in, Weight: 193 lbs, BMI: 26.2, Temperature: 97.7 F, Pulse: 76 bpm, Respiratory Rate: 16 breaths/min, Blood Pressure: 173/87 mmHg. Respiratory normal breathing without difficulty. Psychiatric this patient is able to make decisions and demonstrates good insight into disease process. Alert and Oriented x 3. pleasant and cooperative. General Notes: Patient's wound bed actually showed signs of good granulation  epithelization at this point for the most part I do not see any significant slough buildup this could require sharp debridement at this point that is good news. I did use saline and gauze and a sterile Q-tip to clean away the surface of the wound there is a little bit of bleeding but everything appears to be extremely healthy. Chart Integumentary (Hair, Skin) Wound #2 status is Open. Original cause of wound was Surgical Injury. The date acquired was: 02/06/2021. The wound is located on the Left Groin. The wound measures 1cm length x 0.5cm width x 0.4cm depth; 0.393cm^2 area and 0.157cm^3 volume. There is Fat Layer (Subcutaneous Tissue) exposed. There is no tunneling or undermining noted. There is a medium amount of serous drainage noted. The wound margin is flat and intact. There is medium (34-66%) granulation within the wound bed. There is a medium (34-66%) amount of necrotic tissue within the wound bed. Assessment Active Problems ICD-10 Disruption of external operation (surgical) wound, not elsewhere classified, initial encounter Non-pressure chronic ulcer of skin of other sites with fat layer exposed Venous insufficiency (chronic) (peripheral) Paroxysmal atrial fibrillation Long term (current) use of anticoagulants Essential (primary) hypertension Henry Hill, GEURIN. ([id number]) Plan Follow-up Appointments: Return Appointment in 2 weeks. Bathing/ Shower/ Hygiene: May shower; gently cleanse wound with antibacterial soap, rinse and pat dry prior to dressing wounds WOUND #2: - Groin Wound Laterality: Left Cleanser: Soap and Water 3 x Per Week/30 Days Discharge Instructions: Gently cleanse wound with antibacterial soap, rinse and pat dry prior to dressing wounds Primary Dressing: Prisma 4.34 (in) (DME) (Generic) 3 x Per Week/30 Days Discharge Instructions: Moisten w/normal saline or sterile water; Cover wound as directed. Do not remove from wound bed. Secondary Dressing: Mepilex Border Flex,  4x4 (in/in) (DME) (Generic) 3 x Per Week/30 Days Discharge Instructions: Apply to wound as directed. Do not cut. 1. Would recommend that we go ahead and continue with the wound care measures as before  and the patient is in agreement with plan this will be the silver collagen that he has been using up to this point I think that still a good option. 2. I am also can recommend that we have the patient continue with the border foam to cover I think this is doing well he tells me she has been very expensive buying this on his own we will see what we can do about ordering some supplies for him being a surgical wound they are definitely covered by insurance typically. We will see patient back for reevaluation in 1 week here in the clinic. If anything worsens or changes patient will contact our office for additional recommendations. Electronic Signature(s) Signed: 03/24/2021 1:55:11 PM By: Worthy Keeler PA-C Entered By: Worthy Keeler on 03/24/2021 13:55:11 Henry Hill ([id number]) -------------------------------------------------------------------------------- ROS/PFSH Details Patient Name: Henry Hill, Henry Hill. Date of Service: 03/24/2021 12:45 PM Medical Record Number: [id number] Patient Account Number: [id number] Date of Birth/Sex: Dec 05, 1960 (60 y.o. M) Treating RN: Cornell Barman Primary Care Provider: SYSTEM, PCP Other Clinician: Referring Provider: Referral, Self Treating Provider/Extender: Skipper Cliche in Treatment: 0 Information Obtained From Patient Eyes Medical History: Negative for: Cataracts; Glaucoma; Optic Neuritis Ear/Nose/Mouth/Throat Medical History: Negative for: Chronic sinus problems/congestion; Middle ear problems Hematologic/Lymphatic Medical History: Negative for: Anemia; Hemophilia; Human Immunodeficiency Virus; Lymphedema; Sickle Cell Disease Respiratory Medical History: Negative for: Aspiration; Asthma; Chronic Obstructive Pulmonary Disease (COPD);  Pneumothorax; Sleep Apnea; Tuberculosis Cardiovascular Medical History: Positive for: Arrhythmia; Hypertension Negative for: Congestive Heart Failure; Coronary Artery Disease; Deep Vein Thrombosis; Hypotension; Myocardial Infarction; Peripheral Arterial Disease; Peripheral Venous Disease; Phlebitis; Vasculitis Gastrointestinal Medical History: Negative for: Cirrhosis ; Colitis; Crohnos; Hepatitis A; Hepatitis B; Hepatitis C Endocrine Medical History: Negative for: Type I Diabetes; Type II Diabetes Genitourinary Medical History: Negative for: End Stage Renal Disease Immunological Medical History: Negative for: Lupus Erythematosus; Raynaudos; Scleroderma Integumentary (Skin) Medical History: Negative for: History of Burn; History of pressure wounds Musculoskeletal Henry Hill, Henry Hill ([id number]) Medical History: Negative for: Gout; Rheumatoid Arthritis; Osteoarthritis; Osteomyelitis Neurologic Medical History: Negative for: Dementia; Neuropathy; Quadriplegia; Paraplegia; Seizure Disorder Oncologic Medical History: Negative for: Received Chemotherapy; Received Radiation Psychiatric Medical History: Negative for: Anorexia/bulimia; Confinement Anxiety Immunizations Pneumococcal Vaccine: Received Pneumococcal Vaccination: No Implantable Devices None Family and Social History Current every day smoker; Marital Status - Married; Alcohol Use: Daily; Drug Use: No History; Caffeine Use: Never Electronic Signature(s) Signed: 03/24/2021 4:51:13 PM By: Worthy Keeler PA-C Signed: 03/25/2021 4:58:16 PM By: Gretta Cool, BSN, RN, CWS, Kim RN, BSN Entered By: Gretta Cool, BSN, RN, CWS, Kim on 03/24/2021 13:13:37 Henry Hill ([id number]) -------------------------------------------------------------------------------- SuperBill Details Patient Name: Henry Hill, Henry Hill. Date of Service: 03/24/2021 Medical Record Number: [id number] Patient Account Number: [id number] Date of Birth/Sex: [birth year] (60 y.o.  M) Treating RN: Cornell Barman Primary Care Provider: SYSTEM, PCP Other Clinician: Referring Provider: Referral, Self Treating Provider/Extender: Skipper Cliche in Treatment: 0 Diagnosis Coding ICD-10 Codes Code Description T81.31XA Disruption of external operation (surgical) wound, not elsewhere classified, initial encounter L98.492 Non-pressure chronic ulcer of skin of other sites with fat layer exposed I87.2 Venous insufficiency (chronic) (peripheral) I48.0 Paroxysmal atrial fibrillation Z79.01 Long term (current) use of anticoagulants I10 Essential (primary) hypertension Facility Procedures CPT4 Code: [id number] Description: 99214 - WOUND CARE VISIT-LEV 4 EST PT Modifier: Quantity: 1 Physician Procedures CPT4 Code: [id number] Description: 99214 - WC PHYS LEVEL 4 - EST PT Modifier: Quantity: 1 CPT4 Code: Description: ICD-10 Diagnosis Description T81.31XA Disruption of external operation (surgical) wound, not elsewhere classifi 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

## 2021-03-25 NOTE — Progress Notes (Signed)
Henry Hill ([id number]) Visit Report for 03/24/2021 Abuse/Suicide Risk Screen Details Patient Name: Henry Hill, Henry Hill. Date of Service: 03/24/2021 12:45 PM Medical Record Number: [id number] Patient Account Number: [id number] Date of Birth/Sex: 18-May-1960 (60 y.o. M) Treating RN: Henry Hill Primary Care Henry Hill: SYSTEM, PCP Other Clinician: Referring Henry Hill: Referral, Self Treating Henry Hill/Extender: Henry Hill in Treatment: 0 Abuse/Suicide Risk Screen Items Answer ABUSE RISK SCREEN: Has anyone close to you tried to hurt or harm you recentlyo No Do you feel uncomfortable with anyone in your familyo No Has anyone forced you do things that you didnot want to doo No Electronic Signature(s) Signed: 03/25/2021 4:58:16 PM By: Henry Hill, BSN, RN, CWS, Kim RN, BSN Entered By: Henry Hill, BSN, RN, CWS, Henry Hill on 03/24/2021 13:13:46 Henry Hill ([id number]) -------------------------------------------------------------------------------- Activities of Daily Living Details Patient Name: Henry Hill, Henry Hill. Date of Service: 03/24/2021 12:45 PM Medical Record Number: [id number] Patient Account Number: [id number] Date of Birth/Sex: December 08, 1960 (60 y.o. M) Treating RN: Henry Hill Primary Care Henry Hill: SYSTEM, PCP Other Clinician: Referring Henry Hill: Referral, Self Treating Henry Hill/Extender: Henry Hill in Treatment: 0 Activities of Daily Living Items Answer Activities of Daily Living (Please select one for each item) Drive Automobile Completely Able Take Medications Completely Able Use Telephone Completely Able Care for Appearance Completely Able Use Toilet Completely Able Bath / Shower Completely Able Dress Self Completely Able Feed Self Completely Able Walk Completely Able Get In / Out Bed Completely Able Housework Completely Able Prepare Meals Completely Able Handle Money Completely Able Shop for Self Completely Able Electronic Signature(s) Signed: 03/25/2021 4:58:16 PM By:  Henry Hill, BSN, RN, CWS, Kim RN, BSN Entered By: Henry Hill, BSN, RN, CWS, Henry Hill on 03/24/2021 13:13:58 Henry Hill ([id number]) -------------------------------------------------------------------------------- Education Screening Details Patient Name: Henry Hill, Henry Hill. Date of Service: 03/24/2021 12:45 PM Medical Record Number: [id number] Patient Account Number: [id number] Date of Birth/Sex: 21-Feb-1961 (60 y.o. M) Treating RN: Henry Hill Primary Care Henry Hill: SYSTEM, PCP Other Clinician: Referring Henry Hill: Referral, Self Treating Henry Hill/Extender: Henry Hill in Treatment: 0 Primary Learner Assessed: Patient Learning Preferences/Education Level/Primary Language Learning Preference: Explanation, Demonstration Highest Education Level: College or Above Preferred Language: English Cognitive Barrier Language Barrier: No Translator Needed: No Memory Deficit: No Emotional Barrier: No Cultural/Religious Beliefs Affecting Medical Care: No Physical Barrier Impaired Vision: No Impaired Hearing: No Decreased Hand dexterity: No Knowledge/Comprehension Knowledge Level: High Comprehension Level: High Ability to understand written instructions: High Ability to understand verbal instructions: High Motivation Anxiety Level: Calm Cooperation: Cooperative Education Importance: Acknowledges Need Interest in Health Problems: Asks Questions Perception: Coherent Willingness to Engage in Self-Management High Activities: Readiness to Engage in Self-Management High Activities: Engineer, maintenance) Signed: 03/25/2021 4:58:16 PM By: Henry Hill, BSN, RN, CWS, Kim RN, BSN Entered By: Henry Hill, BSN, RN, CWS, Henry Hill on 03/24/2021 13:14:23 Henry Hill ([id number]) -------------------------------------------------------------------------------- Fall Risk Assessment Details Patient Name: Henry Hill. Date of Service: 03/24/2021 12:45 PM Medical Record Number: [id number] Patient Account Number:  [id number] Date of Birth/Sex: [birth year] (60 y.o. M) Treating RN: Henry Hill Primary Care Henry Hill: SYSTEM, PCP Other Clinician: Referring Henry Hill: Referral, Self Treating Henry Hill/Extender: Henry Hill in Treatment: 0 Fall Risk Assessment Items Have you had 2 or more falls in the last 12 monthso 0 No Have you had any fall that resulted in injury in the last 12 monthso 0 No FALLS RISK SCREEN History of falling - immediate or within 3 months 0 No Secondary diagnosis (Do you have 2 or more medical diagnoseso) 0 No Ambulatory aid None/bed rest/wheelchair/nurse 0 Yes Crutches/cane/walker  0 No Furniture 0 No Intravenous therapy Access/Saline/Heparin Lock 0 No Gait/Transferring Normal/ bed rest/ wheelchair 0 Yes Weak (short steps with or without shuffle, stooped but able to lift head while walking, may 0 No seek support from furniture) Impaired (short steps with shuffle, may have difficulty arising from chair, head down, impaired 0 No balance) Mental Status Oriented to own ability 0 Yes Electronic Signature(s) Signed: 03/25/2021 4:58:16 PM By: Henry Hill, BSN, RN, CWS, Kim RN, BSN Entered By: Henry Hill, BSN, RN, CWS, Henry Hill on 03/24/2021 13:14:41 Henry Hill ([id number]) -------------------------------------------------------------------------------- Foot Assessment Details Patient Name: Henry Hill, Henry C. Date of Service: 03/24/2021 12:45 PM Medical Record Number: [id number] Patient Account Number: [id number] Date of Birth/Sex: 12/04/1960 (60 y.o. M) Treating RN: Henry Hill Primary Care Amor Packard: SYSTEM, PCP Other Clinician: Referring Denis Carreon: Referral, Self Treating Dearies Meikle/Extender: Henry Hill in Treatment: 0 Foot Assessment Items Site Locations + = Sensation present, - = Sensation absent, C = Callus, U = Ulcer R = Redness, W = Warmth, M = Maceration, PU = Pre-ulcerative lesion F = Fissure, S = Swelling, D = Dryness Assessment Right: Left: Other Deformity: No No Prior  Foot Ulcer: No No Prior Amputation: No No Charcot Joint: No No Ambulatory Status: Ambulatory Without Help Gait: Steady Electronic Signature(s) Signed: 03/25/2021 4:58:16 PM By: Henry Hill, BSN, RN, CWS, Kim RN, BSN Entered By: Henry Hill, BSN, RN, CWS, Henry Hill on 03/24/2021 13:33:30 Henry Hill ([id number]) -------------------------------------------------------------------------------- Nutrition Risk Screening Details Patient Name: Henry Hill, Henry C. Date of Service: 03/24/2021 12:45 PM Medical Record Number: [id number] Patient Account Number: [id number] Date of Birth/Sex: 06/14/1960 (60 y.o. M) Treating RN: Henry Hill Primary Care Triton Heidrich: SYSTEM, PCP Other Clinician: Referring Randon Somera: Referral, Self Treating Talon Regala/Extender: Henry Hill in Treatment: 0 Height (in): 72 Weight (lbs): 193 Body Mass Index (BMI): 26.2 Nutrition Risk Screening Items Score Screening NUTRITION RISK SCREEN: I have an illness or condition that made me change the kind and/or amount of food I eat 0 No I eat fewer than two meals per day 0 No I eat few fruits and vegetables, or milk products 0 No I have three or more drinks of beer, liquor or wine almost every day 0 No I have tooth or mouth problems that make it hard for me to eat 0 No I don't always have enough money to buy the food I need 0 No I eat alone most of the time 0 No I take three or more different prescribed or over-the-counter drugs a day 0 No Without wanting to, I have lost or gained 10 pounds in the last six months 0 No I am not always physically able to shop, cook and/or feed myself 0 No Nutrition Protocols Good Risk Protocol 0 No interventions needed Moderate Risk Protocol High Risk Proctocol Risk Level: Good Risk Score: 0 Electronic Signature(s) Signed: 03/25/2021 4:58:16 PM By: Henry Hill, BSN, RN, CWS, Kim RN, BSN Entered By: Henry Hill, BSN, RN, CWS, Henry Hill on 03/24/2021 13:14:49

## 2021-03-25 NOTE — Progress Notes (Addendum)
MCKENNA, BORUFF ([id number]) Visit Report for 03/24/2021 Allergy List Details Patient Name: Henry Hill, Henry Hill. Date of Service: 03/24/2021 12:45 PM Medical Record Number: [id number] Patient Account Number: [id number] Date of Birth/Sex: August 01, 1960 (60 y.o. M) Treating RN: Cornell Barman Primary Care Maronda Caison: SYSTEM, PCP Other Clinician: Referring Alizon Schmeling: Referral, Self Treating Zayli Villafuerte/Extender: Skipper Cliche in Treatment: 0 Allergies Active Allergies No Known Allergies Allergy Notes Electronic Signature(s) Signed: 03/25/2021 4:58:16 PM By: Gretta Cool, BSN, RN, CWS, Kim RN, BSN Entered By: Gretta Cool, BSN, RN, CWS, Kim on 03/24/2021 13:13:17 [name] ([id number]) -------------------------------------------------------------------------------- Arrival Information Details Patient Name: Henry Hill, Henry Hill. Date of Service: 03/24/2021 12:45 PM Medical Record Number: [id number] Patient Account Number: [id number] Date of Birth/Sex: March 17, 1961 (60 y.o. M) Treating RN: Cornell Barman Primary Care Alick Lecomte: SYSTEM, PCP Other Clinician: Referring Cniyah Sproull: Referral, Self Treating Johathon Overturf/Extender: Skipper Cliche in Treatment: 0 Visit Information Patient Arrived: Ambulatory Arrival Time: 13:02 Accompanied By: self Transfer Assistance: None Patient Identification Verified: Yes Secondary Verification Process Completed: Yes Patient Has Alerts: Yes Patient Alerts: Patient on Blood Thinner Plavix History Since Last Visit Electronic Signature(s) Signed: 03/25/2021 4:58:16 PM By: Gretta Cool, BSN, RN, CWS, Kim RN, BSN Entered By: Gretta Cool, BSN, RN, CWS, Kim on 03/24/2021 13:02:56 [name] ([id number]) -------------------------------------------------------------------------------- Clinic Level of Care Assessment Details Patient Name: Henry Hill, Henry Hill. Date of Service: 03/24/2021 12:45 PM Medical Record Number: [id number] Patient Account Number: [id number] Date of Birth/Sex: 12/27/1960 (60 y.o.  M) Treating RN: Cornell Barman Primary Care Olajuwon Fosdick: SYSTEM, PCP Other Clinician: Referring Owyn Raulston: Referral, Self Treating Justene Jensen/Extender: Skipper Cliche in Treatment: 0 Clinic Level of Care Assessment Items TOOL 2 Quantity Score []  - Use when only an EandM is performed on the INITIAL visit 0 ASSESSMENTS - Nursing Assessment / Reassessment X - General Physical Exam (combine w/ comprehensive assessment (listed just below) when performed on new 1 20 pt. evals) X- 1 25 Comprehensive Assessment (HX, ROS, Risk Assessments, Wounds Hx, etc.) ASSESSMENTS - Wound and Skin Assessment / Reassessment X - Simple Wound Assessment / Reassessment - one wound 1 5 []  - 0 Complex Wound Assessment / Reassessment - multiple wounds []  - 0 Dermatologic / Skin Assessment (not related to wound area) ASSESSMENTS - Ostomy and/or Continence Assessment and Care []  - Incontinence Assessment and Management 0 []  - 0 Ostomy Care Assessment and Management (repouching, etc.) PROCESS - Coordination of Care X - Simple Patient / Family Education for ongoing care 1 15 []  - 0 Complex (extensive) Patient / Family Education for ongoing care X- 1 10 Staff obtains Programmer, systems, Records, Test Results / Process Orders []  - 0 Staff telephones HHA, Nursing Homes / Clarify orders / etc []  - 0 Routine Transfer to another Facility (non-emergent condition) []  - 0 Routine Hospital Admission (non-emergent condition) X- 1 15 New Admissions / Biomedical engineer / Ordering NPWT, Apligraf, etc. []  - 0 Emergency Hospital Admission (emergent condition) X- 1 10 Simple Discharge Coordination []  - 0 Complex (extensive) Discharge Coordination PROCESS - Special Needs []  - Pediatric / Minor Patient Management 0 []  - 0 Isolation Patient Management []  - 0 Hearing / Language / Visual special needs []  - 0 Assessment of Community assistance (transportation, D/C planning, etc.) []  - 0 Additional assistance / Altered  mentation []  - 0 Support Surface(s) Assessment (bed, cushion, seat, etc.) INTERVENTIONS - Wound Cleansing / Measurement X - Wound Imaging (photographs - any number of wounds) 1 5 []  - 0 Wound Tracing (instead of photographs) X- 1 5 Simple Wound Measurement - one  wound []  - 0 Complex Wound Measurement - multiple wounds KENZIE, FLAKES. ([id number]) X- 1 5 Simple Wound Cleansing - one wound []  - 0 Complex Wound Cleansing - multiple wounds INTERVENTIONS - Wound Dressings X - Small Wound Dressing one or multiple wounds 1 10 []  - 0 Medium Wound Dressing one or multiple wounds []  - 0 Large Wound Dressing one or multiple wounds []  - 0 Application of Medications - injection INTERVENTIONS - Miscellaneous []  - External ear exam 0 []  - 0 Specimen Collection (cultures, biopsies, blood, body fluids, etc.) []  - 0 Specimen(s) / Culture(s) sent or taken to Lab for analysis []  - 0 Patient Transfer (multiple staff / Civil Service fast streamer / Similar devices) []  - 0 Simple Staple / Suture removal (25 or less) []  - 0 Complex Staple / Suture removal (26 or more) []  - 0 Hypo / Hyperglycemic Management (close monitor of Blood Glucose) []  - 0 Ankle / Brachial Index (ABI) - do not check if billed separately Has the patient been seen at the hospital within the last three years: Yes Total Score: 125 Level Of Care: New/Established - Level 4 Electronic Signature(s) Signed: 03/25/2021 4:58:16 PM By: Gretta Cool, BSN, RN, CWS, Kim RN, BSN Entered By: Gretta Cool, BSN, RN, CWS, Kim on 03/24/2021 16:34:46 [name] ([id number]) -------------------------------------------------------------------------------- Encounter Discharge Information Details Patient Name: Henry Hill, Henry Hill. Date of Service: 03/24/2021 12:45 PM Medical Record Number: [id number] Patient Account Number: [id number] Date of Birth/Sex: 09/06/1960 (60 y.o. M) Treating RN: Cornell Barman Primary Care Brandolyn Shortridge: SYSTEM, PCP Other Clinician: Referring Abra Lingenfelter:  Referral, Self Treating Lambert Jeanty/Extender: Skipper Cliche in Treatment: 0 Encounter Discharge Information Items Discharge Condition: Stable Ambulatory Status: Ambulatory Discharge Destination: Home Transportation: Private Auto Schedule Follow-up Appointment: Yes Clinical Summary of Care: Electronic Signature(s) Signed: 03/24/2021 4:36:06 PM By: Gretta Cool, BSN, RN, CWS, Kim RN, BSN Entered By: Gretta Cool, BSN, RN, CWS, Kim on 03/24/2021 16:36:06 [name] ([id number]) -------------------------------------------------------------------------------- Lower Extremity Assessment Details Patient Name: Henry Hill, Henry Hill. Date of Service: 03/24/2021 12:45 PM Medical Record Number: [id number] Patient Account Number: [id number] Date of Birth/Sex: [birth year] (60 y.o. M) Treating RN: Cornell Barman Primary Care Tristian Bouska: SYSTEM, PCP Other Clinician: Referring Darivs Lunden: Referral, Self Treating Eulalio Reamy/Extender: Skipper Cliche in Treatment: 0 Electronic Signature(s) Signed: 03/25/2021 4:58:16 PM By: Gretta Cool, BSN, RN, CWS, Kim RN, BSN Entered By: Gretta Cool, BSN, RN, CWS, Kim on 03/24/2021 13:13:13 [name] ([id number]) -------------------------------------------------------------------------------- Multi Wound Chart Details Patient Name: Henry Hill, Henry Hill. Date of Service: 03/24/2021 12:45 PM Medical Record Number: [id number] Patient Account Number: [id number] Date of Birth/Sex: [birth year] (60 y.o. M) Treating RN: Cornell Barman Primary Care Juston Goheen: SYSTEM, PCP Other Clinician: Referring Tonilynn Bieker: Referral, Self Treating Khadeejah Castner/Extender: Skipper Cliche in Treatment: 0 Vital Signs Height(in): 72 Pulse(bpm): 75 Weight(lbs): 193 Blood Pressure(mmHg): 173/87 Body Mass Index(BMI): 26 Temperature(F): 97.7 Respiratory Rate(breaths/min): 16 Photos: [N/A:N/A] Wound Location: Left Groin N/A N/A Wounding Event: Surgical Injury N/A N/A Primary Etiology: Dehisced Wound N/A N/A Date Acquired:  02/06/2021 N/A N/A Weeks of Treatment: 0 N/A N/A Wound Status: Open N/A N/A Measurements L x W x D (cm) 1x0.5x0.4 N/A N/A Area (cm) : 0.393 N/A N/A Volume (cm) : 0.157 N/A N/A % Reduction in Area: 0.00% N/A N/A % Reduction in Volume: 0.00% N/A N/A Classification: Full Thickness Without Exposed N/A N/A Support Structures Exudate Amount: Medium N/A N/A Exudate Type: Serous N/A N/A Exudate Color: amber N/A N/A Wound Margin: Flat and Intact N/A N/A Granulation Amount: Medium (34-66%) N/A N/A Necrotic Amount: Medium (34-66%) N/A  N/A Exposed Structures: Fat Layer (Subcutaneous Tissue): N/A N/A Yes Fascia: No Tendon: No Muscle: No Joint: No Bone: No Epithelialization: None N/A N/A Treatment Notes Electronic Signature(s) Signed: 03/24/2021 4:34:05 PM By: Gretta Cool, BSN, RN, CWS, Kim RN, BSN Entered By: Gretta Cool, BSN, RN, CWS, Kim on 03/24/2021 16:34:05 [name] ([id number]) -------------------------------------------------------------------------------- Multi-Disciplinary Care Plan Details Patient Name: Henry Hill, Henry Hill. Date of Service: 03/24/2021 12:45 PM Medical Record Number: [id number] Patient Account Number: [id number] Date of Birth/Sex: February 13, 1961 (60 y.o. M) Treating RN: Cornell Barman Primary Care Jazzmine Kleiman: SYSTEM, PCP Other Clinician: Referring Bricelyn Freestone: Referral, Self Treating Antonis Lor/Extender: Skipper Cliche in Treatment: 0 Active Inactive Electronic Signature(s) Signed: 04/17/2021 9:42:52 AM By: Gretta Cool, BSN, RN, CWS, Kim RN, BSN Previous Signature: 03/24/2021 4:33:49 PM Version By: Gretta Cool, BSN, RN, CWS, Kim RN, BSN Entered By: Gretta Cool, BSN, RN, CWS, Kim on 04/17/2021 09:42:52 [name] ([id number]) -------------------------------------------------------------------------------- Pain Assessment Details Patient Name: Henry Hill, Henry Hill. Date of Service: 03/24/2021 12:45 PM Medical Record Number: [id number] Patient Account Number: [id number] Date of Birth/Sex: 1960-05-15  (60 y.o. M) Treating RN: Cornell Barman Primary Care Rachael Zapanta: SYSTEM, PCP Other Clinician: Referring Mareena Cavan: Referral, Self Treating Jaramie Bastos/Extender: Skipper Cliche in Treatment: 0 Active Problems Location of Pain Severity and Description of Pain Patient Has Paino No Site Locations Pain Management and Medication Current Pain Management: Electronic Signature(s) Signed: 03/25/2021 4:58:16 PM By: Gretta Cool, BSN, RN, CWS, Kim RN, BSN Entered By: Gretta Cool, BSN, RN, CWS, Kim on 03/24/2021 13:04:28 [name] ([id number]) -------------------------------------------------------------------------------- Patient/Caregiver Education Details Patient Name: [name] Date of Service: 03/24/2021 12:45 PM Medical Record Number: [id number] Patient Account Number: [id number] Date of Birth/Gender: 02-06-1961 (60 y.o. M) Treating RN: Cornell Barman Primary Care Physician: SYSTEM, PCP Other Clinician: Referring Physician: Referral, Self Treating Physician/Extender: Skipper Cliche in Treatment: 0 Education Assessment Education Provided To: Patient Education Topics Provided Smoking and Wound Healing: Handouts: Smoking and Wound Healing Methods: Demonstration, Explain/Verbal Responses: State content correctly Welcome To [facility]: Handouts: Welcome To [facility] Methods: Demonstration, Explain/Verbal Responses: State content correctly Electronic Signature(s) Signed: 03/25/2021 4:58:16 PM By: Gretta Cool, BSN, RN, CWS, Kim RN, BSN Entered By: Gretta Cool, BSN, RN, CWS, Kim on 03/24/2021 16:35:20 [name] ([id number]) -------------------------------------------------------------------------------- Wound Assessment Details Patient Name: Henry Hill, Henry Hill. Date of Service: 03/24/2021 12:45 PM Medical Record Number: [id number] Patient Account Number: [id number] Date of Birth/Sex: 11-02-1960 (60 y.o. M) Treating RN: Cornell Barman Primary Care Aislynn Cifelli: SYSTEM, PCP Other  Clinician: Referring Karter Hellmer: Referral, Self Treating Donya Tomaro/Extender: Skipper Cliche in Treatment: 0 Wound Status Wound Number: 2 Primary Etiology: Dehisced Wound Wound Location: Left Groin Wound Status: Open Wounding Event: Surgical Injury Date Acquired: 02/06/2021 Weeks Of Treatment: 0 Clustered Wound: No Photos Wound Measurements Length: (cm) 1 Width: (cm) 0.5 Depth: (cm) 0.4 Area: (cm) 0.393 Volume: (cm) 0.157 % Reduction in Area: 0% % Reduction in Volume: 0% Epithelialization: None Tunneling: No Undermining: No Wound Description Classification: Full Thickness Without Exposed Support Structu Wound Margin: Flat and Intact Exudate Amount: Medium Exudate Type: Serous Exudate Color: amber res Foul Odor After Cleansing: No Slough/Fibrino Yes Wound Bed Granulation Amount: Medium (34-66%) Exposed Structure Necrotic Amount: Medium (34-66%) Fascia Exposed: No Fat Layer (Subcutaneous Tissue) Exposed: Yes Tendon Exposed: No Muscle Exposed: No Joint Exposed: No Bone Exposed: No Electronic Signature(s) Signed: 03/25/2021 4:58:16 PM By: Gretta Cool, BSN, RN, CWS, Kim RN, BSN Entered By: Gretta Cool, BSN, RN, CWS, Kim on 03/24/2021 13:12:49 [name] ([id number]) -------------------------------------------------------------------------------- Vitals Details Patient Name: Henry Flirt  C. Date of Service: 03/24/2021 12:45 PM Medical Record Number: [id number] Patient Account Number: [id number] Date of Birth/Sex: Mar 05, 1961 (60 y.o. M) Treating RN: Cornell Barman Primary Care Bresha Hosack: SYSTEM, PCP Other Clinician: Referring Leanard Dimaio: Referral, Self Treating Traye Bates/Extender: Skipper Cliche in Treatment: 0 Vital Signs Time Taken: 13:05 Temperature (F): 97.7 Height (in): 72 Pulse (bpm): 76 Weight (lbs): 193 Respiratory Rate (breaths/min): 16 Body Mass Index (BMI): 26.2 Blood Pressure (mmHg): 173/87 Reference Range: 80 - 120 mg / dl Electronic Signature(s) Signed:  03/25/2021 4:58:16 PM By: Gretta Cool, BSN, RN, CWS, Kim RN, BSN Entered By: Gretta Cool, BSN, RN, CWS, Kim on 03/24/2021 13:06:26

## 2021-04-09 ENCOUNTER — Ambulatory Visit: Payer: 59 | Admitting: Physician Assistant

## 2021-04-26 ENCOUNTER — Other Ambulatory Visit: Payer: Self-pay | Admitting: Nurse Practitioner

## 2021-04-29 ENCOUNTER — Other Ambulatory Visit: Payer: Self-pay

## 2021-04-29 ENCOUNTER — Encounter: Payer: Self-pay | Admitting: Cardiovascular Disease

## 2021-04-29 ENCOUNTER — Ambulatory Visit (INDEPENDENT_AMBULATORY_CARE_PROVIDER_SITE_OTHER): Payer: 59 | Admitting: Cardiovascular Disease

## 2021-04-29 VITALS — BP 168/98 | HR 74 | Ht 72.0 in | Wt 198.1 lb

## 2021-04-29 DIAGNOSIS — I10 Essential (primary) hypertension: Secondary | ICD-10-CM | POA: Diagnosis not present

## 2021-04-29 DIAGNOSIS — Z720 Tobacco use: Secondary | ICD-10-CM

## 2021-04-29 DIAGNOSIS — I739 Peripheral vascular disease, unspecified: Secondary | ICD-10-CM

## 2021-04-29 DIAGNOSIS — I480 Paroxysmal atrial fibrillation: Secondary | ICD-10-CM | POA: Diagnosis not present

## 2021-04-29 DIAGNOSIS — E785 Hyperlipidemia, unspecified: Secondary | ICD-10-CM

## 2021-04-29 MED ORDER — ATORVASTATIN CALCIUM 10 MG PO TABS: 10.0000 mg | ORAL_TABLET | Freq: Every day | ORAL | 5 refills | Status: DC

## 2021-04-29 MED ORDER — RIVAROXABAN 20 MG PO TABS: 20.0000 mg | ORAL_TABLET | Freq: Every day | ORAL | 1 refills | Status: DC

## 2021-04-29 NOTE — Patient Instructions (Signed)
Medication Instructions:  Your physician has recommended you make the following change in your medication:   1) STOP Plavix (clopidogrel)  2) RESUME Xarelto 20 mg daily with food. An Rx has been sent to your pharmacy  3) RESUME Atorvastatin 10 mg daily. An Rx has been sent to your pharmacy  *If you need a refill on your cardiac medications before your next appointment, please call your pharmacy*   Lab Work: None ordered  If you have labs (blood work) drawn today and your tests are completely normal, you will receive your results only by: [place] (if you have MyChart) OR A paper copy in the mail If you have any lab test that is abnormal or we need to change your treatment, we will call you to review the results.   Testing/Procedures: None ordered   Follow-Up: At [facility], you and your health needs are our priority.  As part of our continuing mission to provide you with exceptional heart care, we have created designated Provider Care Teams.  These Care Teams include your primary Cardiologist (physician) and Advanced Practice Providers (APPs -  Physician Assistants and Nurse Practitioners) who all work together to provide you with the care you need, when you need it.  We recommend signing up for the patient portal called "MyChart".  Sign up information is provided on this After Visit Summary.  MyChart is used to connect with patients for Virtual Visits (Telemedicine).  Patients are able to view lab/test results, encounter notes, upcoming appointments, etc.  Non-urgent messages can be sent to your provider as well.   To learn more about what you can do with MyChart, go to [email or web address].    Your next appointment:   6 month(s)  The format for your next appointment:   In Person  Provider:   You may see [name], MD or one of the following Advanced Practice Providers on your designated Care Team:   Murray Hodgkins, NP Christell Faith, PA-C Cadence Kathlen Mody,  PA-C{    Other Instructions N/A

## 2021-04-29 NOTE — Progress Notes (Signed)
Cardiology Office Note   Date:  04/29/2021   ID:  Henry Hill, DOB 1960-07-14, MRN [id number]  PCP:  Pcp, No  Cardiologist:  Dr. Rockey Situ  Chief Complaint  Patient presents with   Other    4 month f/u no complaints today. Meds reviewed verbally with pt.      History of Present Illness: Henry Hill is a 60 y.o. male who is here today for follow-up visit regarding peripheral arterial disease.    He has known history of paroxysmal atrial fibrillation, essential hypertension, tobacco use, alcohol use, probable sleep apnea and varicose veins.  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  He had atrial fibrillation with RVR in that setting.  He converted sinus rhythm with metoprolol and diltiazem.  Echocardiogram showed an EF of 50 to 55%.  He was anticoagulated with Eliquis.  He had a right colectomy done with improvement.  He had subsequent Lexiscan Myoview in June which showed no clear evidence of ischemia although the stress test was suboptimal.   He was seen recently for bilateral thigh and calf claudication after walking about 1 block.  The symptoms have been present for about 2 to 3 years.  Most recently, he developed a large ulceration on the medial side of the left ankle.  He has been going to the wound clinic and this has improved significantly and almost healed completely.  He smokes half a pack per day and has been smoking for a long time.  He had recent lower extremity arterial Doppler which showed an ABI of 0.67 bilaterally.  Duplex showed significant calcified right common femoral artery stenosis with possible short occlusion in the right distal SFA.  On the left, there was severe stenosis in the distal common femoral artery with an occluded popliteal artery. Angiography was performed on August 3 which showed no significant aortoiliac disease.  On the right side, there was severe heavily calcified subtotal occlusion of the common femoral  artery extending into the ostium of both SFA and profunda with occluded distal SFA and three-vessel runoff below the knee.  On the left side, there was heavily calcified left common femoral artery stenosis extending into the ostium of the SFA with short occlusion of the proximal to mid SFA, occluded popliteal artery and three-vessel runoff below the knee.   He underwent bilateral common femoral artery endarterectomy in October.  He had some issues with swelling as well as wound dehiscence that subsequently resolved.  He is feeling better with no claudication.  No chest pain or shortness of breath.  He continues to smoke.   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 08/2020 s/p ex lap & R colectomy.   Chronic anticoagulation    Rivaroxaban   Diastolic dysfunction    a.) 08/2020 Echo: EF 50-55%, no rwma, Gr1 DD, nl RV size/fxn, triv MR.   Essential hypertension    ETOH abuse    Hypo-osmolality and hyponatremia    Persistent atrial fibrillation ([place])    a.) Dx 08/2020 in setting of cecal volvulus s/p R colectomy.  b.) CHA2DS@VASc  =  2.   Tobacco abuse    Varicose veins of both lower extremities     Past Surgical History:  Procedure Laterality Date   ABDOMINAL AORTOGRAM W/LOWER EXTREMITY N/A 12/10/2020   Procedure: ABDOMINAL AORTOGRAM W/LOWER EXTREMITY;  Surgeon: [name], MD;  Location: [place]  CV LAB;  Service: Cardiovascular;  Laterality: N/A;   BACK SURGERY  05/10/2001   COLONOSCOPY WITH PROPOFOL N/A 11/24/2020   Procedure: COLONOSCOPY WITH PROPOFOL;  Surgeon: [name], MD;  Location: [facility] ENDOSCOPY;  Service: Gastroenterology;  Laterality: N/A;   ENDARTERECTOMY FEMORAL Bilateral 01/14/2021   Procedure: ENDARTERECTOMY FEMORAL;  Surgeon: [name], MD;  Location: [facility] ORS;  Service: Vascular;  Laterality: Bilateral;  Bilateral femoral endarterectomies, COMMON, SFA, AND PROFUNDA  ARTERIES    HEMORRHOID SURGERY  N/A 11/13/2014   Procedure: LEFT LATERAL HEMORRHOIDECTOMY  ;  Surgeon: [name], MD;  Location: [facility] ORS;  Service: General;  Laterality: N/A;   JOINT REPLACEMENT Left 2018   LAPAROTOMY Right 08/30/2020   Procedure: EXPLORATORY LAPAROTOMY WITH RIGHT COLOCTOMY;  Surgeon: [name], MD;  Location: [facility] ORS;  Service: General;  Laterality: Right;     Current Outpatient Medications  Medication Sig Dispense Refill   clopidogrel (PLAVIX) 75 MG tablet Take 1 tablet (75 mg total) by mouth daily. 30 tablet 6   metoprolol tartrate (LOPRESSOR) 50 MG tablet TAKE 1.5 TABLETS BY MOUTH 2 TIMES DAILY. 270 tablet 0   Misc Natural Products (GLUCOSAMINE CHOND MSM FORMULA PO) Take 1 tablet by mouth daily.     Multiple Vitamins-Minerals (MULTIVITAMIN ADULT PO) Take 1 tablet by mouth daily.     No current facility-administered medications for this visit.    Allergies:   Patient has no known allergies.    Social History:  The patient  reports that he has been smoking cigarettes. He has been smoking an average of .5 packs per day. He has never used smokeless tobacco. He reports current alcohol use of about 15.0 standard drinks per week. He reports that he does not use drugs.   Family History:  The patient's family history includes Diabetes in his father; Heart attack in his mother; Hypertension in his father.    ROS:  Please see the history of present illness.   Otherwise, review of systems are positive for none.   All other systems are reviewed and negative.    PHYSICAL EXAM: VS:  BP (!) 168/98 (BP Location: Left Arm, Patient Position: Sitting, Cuff Size: Normal)    Pulse 74    Ht 6' (1.829 m)    Wt 198 lb 2 oz (89.9 kg)    SpO2 98%    BMI 26.87 kg/m  , BMI Body mass index is 26.87 kg/m. GEN: Well nourished, well developed, in no acute distress  HEENT: normal  Neck: no JVD, carotid bruits, or masses Cardiac: RRR; no murmurs, rubs, or gallops,no edema  Respiratory:  clear to auscultation  bilaterally, normal work of breathing GI: soft, nontender, nondistended, + BS MS: no deformity or atrophy  Skin: warm and dry, no rash Neuro:  Strength and sensation are intact Psych: euthymic mood, full affect   EKG:  EKG is ordered today. EKG showed normal sinus rhythm with minimal LVH and nonspecific T wave changes.   Recent Labs: 08/30/2020: TSH 0.864 09/01/2020: ALT 28 09/03/2020: B Natriuretic Peptide 436.6 09/25/2020: Magnesium 2.0 01/15/2021: BUN 10; Creatinine, Ser 0.51; Hemoglobin 13.3; Platelets 235; Potassium 4.2; Sodium 127    Lipid Panel    Component Value Date/Time   CHOL 151 07/03/2019 0816   TRIG 48 09/01/2020 0619   HDL 100 07/03/2019 0816   CHOLHDL 1.5 07/03/2019 0816   LDLCALC 42 07/03/2019 0816      Wt Readings from Last 3 Encounters:  04/29/21 198 lb 2 oz (89.9 kg)  02/26/21 190 lb 9.6 oz (86.5 kg)  02/10/21 185 lb (83.9 kg)       PAD Screen 11/13/2020  Previous PAD dx? No  Previous surgical procedure? Yes  Dates of procedures knee replacement  Pain with walking? Yes  Subsides with rest? No  Feet/toe relief with dangling? No  Painful, non-healing ulcers? Yes  Extremities discolored? Yes      ASSESSMENT AND PLAN:  1.  Peripheral arterial disease : Status post successful bilateral common femoral artery endarterectomy with resolution of claudication.  He has residual disease in the SFA and popliteal arteries which will be treated medically for now given lack of symptoms.    2.  Paroxysmal atrial fibrillation: He is maintaining in sinus rhythm.  I elected to resume Xarelto 20 mg daily given a CHA2DS2-VASc score of 2.  I discontinued clopidogrel to minimize risk of bleeding.  3.  Hyperlipidemia: I asked him to resume atorvastatin 10 mg daily given his known history of peripheral arterial disease.  4.  Tobacco use: I discussed with him the importance of smoking cessation.  He cut down but has not been able to quit smoking completely.  5.   Essential hypertension: Blood pressure is mildly elevated but he just took his metoprolol before he came in.  Continue to monitor for now and can consider adding additional antihypertensive medications if needed.   Disposition:   FU with me in 6 months.  Signed,  [name], MD  04/29/2021 3:15 PM    [facility]

## 2021-05-06 NOTE — Addendum Note (Signed)
Addended by: Anselm Pancoast on: 05/06/2021 11:08 AM   Modules accepted: Orders

## 2021-05-12 ENCOUNTER — Encounter (INDEPENDENT_AMBULATORY_CARE_PROVIDER_SITE_OTHER): Payer: 59

## 2021-05-12 ENCOUNTER — Ambulatory Visit (INDEPENDENT_AMBULATORY_CARE_PROVIDER_SITE_OTHER): Payer: 59 | Admitting: Nurse Practitioner

## 2021-05-20 ENCOUNTER — Encounter (INDEPENDENT_AMBULATORY_CARE_PROVIDER_SITE_OTHER): Payer: Self-pay | Admitting: Nurse Practitioner

## 2021-05-20 ENCOUNTER — Ambulatory Visit (INDEPENDENT_AMBULATORY_CARE_PROVIDER_SITE_OTHER): Payer: 59

## 2021-05-20 ENCOUNTER — Ambulatory Visit (INDEPENDENT_AMBULATORY_CARE_PROVIDER_SITE_OTHER): Payer: 59 | Admitting: Nurse Practitioner

## 2021-05-20 ENCOUNTER — Other Ambulatory Visit: Payer: Self-pay

## 2021-05-20 VITALS — BP 167/92 | Ht 72.0 in | Wt 197.8 lb

## 2021-05-20 DIAGNOSIS — T8130XA Disruption of wound, unspecified, initial encounter: Secondary | ICD-10-CM | POA: Diagnosis not present

## 2021-05-20 DIAGNOSIS — R202 Paresthesia of skin: Secondary | ICD-10-CM

## 2021-05-20 DIAGNOSIS — F17200 Nicotine dependence, unspecified, uncomplicated: Secondary | ICD-10-CM | POA: Diagnosis not present

## 2021-05-20 DIAGNOSIS — I10 Essential (primary) hypertension: Secondary | ICD-10-CM

## 2021-05-20 DIAGNOSIS — I70219 Atherosclerosis of native arteries of extremities with intermittent claudication, unspecified extremity: Secondary | ICD-10-CM

## 2021-05-25 ENCOUNTER — Encounter (INDEPENDENT_AMBULATORY_CARE_PROVIDER_SITE_OTHER): Payer: Self-pay | Admitting: Nurse Practitioner

## 2021-05-25 NOTE — Progress Notes (Signed)
Subjective:    Patient ID: Henry Hill, male    DOB: January 02, 1961, 61 y.o.   MRN: [id number] Chief Complaint  Patient presents with   Follow-up    ultrasound    The patient returns to the office for followup and review of the noninvasive studies. There have been no interval changes in lower extremity symptoms. No interval shortening of the patient's claudication distance or development of rest pain symptoms. No new ulcers or wounds have occurred since the last visit.  There have been no significant changes to the patient's overall health care.  .  The patient continues to smoke  The patient also notes some intermittent numbness and tingling in his hands.  The patient denies amaurosis fugax or recent TIA symptoms. There are no recent neurological changes noted. The patient denies history of DVT, PE or superficial thrombophlebitis. The patient denies recent episodes of angina or shortness of breath.   ABI Rt=0.65 and Lt=0.64  (previous ABI's Rt=0.77 and Lt=0.88) Duplex ultrasound of the bilateral tibial arteries reveals monophasic waveforms with slightly dampened toe waveforms.   Review of Systems  Skin:  Negative for wound.  Neurological:  Positive for numbness.  All other systems reviewed and are negative.     Objective:   Physical Exam Vitals reviewed.  HENT:     Head: Normocephalic.  Cardiovascular:     Rate and Rhythm: Normal rate.     Pulses:          Radial pulses are 1+ on the right side and 1+ on the left side.       Dorsalis pedis pulses are 1+ on the right side and 1+ on the left side.  Pulmonary:     Effort: Pulmonary effort is normal.  Skin:    General: Skin is warm and dry.  Neurological:     Mental Status: He is alert and oriented to person, place, and time.  Psychiatric:        Mood and Affect: Mood normal.        Behavior: Behavior normal.        Thought Content: Thought content normal.        Judgment: Judgment normal.    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 08/2020 s/p ex lap & R colectomy.   Chronic anticoagulation    Rivaroxaban   Diastolic dysfunction    a.) 08/2020 Echo: EF 50-55%, no rwma, Gr1 DD, nl RV size/fxn, triv MR.   Essential hypertension    ETOH abuse    Hypo-osmolality and hyponatremia    Persistent atrial fibrillation ([place])    a.) Dx 08/2020 in setting of cecal volvulus s/p R colectomy.  b.) CHA2DS@VASc  =  2.   Tobacco abuse    Varicose veins of both lower extremities     Social History   Socioeconomic History   Marital status: Married    Spouse name: Not on file   Number of children: Not on file   Years of education: Not on file   Highest education level: Not on file  Occupational History   Not on file  Tobacco Use   Smoking status: Every Day    Packs/day: 0.50    Types: Cigarettes  Smokeless tobacco: Never  Vaping Use   Vaping Use: Never used  Substance and Sexual Activity   Alcohol use: Yes    Alcohol/week: 15.0 standard drinks    Types: 15 Cans of beer per week    Comment: MODERATE USE   Drug use: No   Sexual activity: Not on file  Other Topics Concern   Not on file  Social History Narrative   Not on file   Social Determinants of Health   Financial Resource Strain: Not on file  Food Insecurity: Not on file  Transportation Needs: Not on file  Physical Activity: Not on file  Stress: Not on file  Social Connections: Not on file  Intimate Partner Violence: Not on file    Past Surgical History:  Procedure Laterality Date   ABDOMINAL AORTOGRAM W/LOWER EXTREMITY N/A 12/10/2020   Procedure: ABDOMINAL AORTOGRAM W/LOWER EXTREMITY;  Surgeon: [name], MD;  Location: [place] CV LAB;  Service: Cardiovascular;  Laterality: N/A;   BACK SURGERY  05/10/2001   COLONOSCOPY WITH PROPOFOL N/A 11/24/2020    Procedure: COLONOSCOPY WITH PROPOFOL;  Surgeon: [name], MD;  Location: [facility] ENDOSCOPY;  Service: Gastroenterology;  Laterality: N/A;   ENDARTERECTOMY FEMORAL Bilateral 01/14/2021   Procedure: ENDARTERECTOMY FEMORAL;  Surgeon: [name], MD;  Location: [facility] ORS;  Service: Vascular;  Laterality: Bilateral;  Bilateral femoral endarterectomies, COMMON, SFA, AND PROFUNDA  ARTERIES    HEMORRHOID SURGERY N/A 11/13/2014   Procedure: LEFT LATERAL HEMORRHOIDECTOMY  ;  Surgeon: [name], MD;  Location: [facility] ORS;  Service: General;  Laterality: N/A;   JOINT REPLACEMENT Left 2018   LAPAROTOMY Right 08/30/2020   Procedure: EXPLORATORY LAPAROTOMY WITH RIGHT COLOCTOMY;  Surgeon: [name], MD;  Location: [facility] ORS;  Service: General;  Laterality: Right;    Family History  Problem Relation Age of Onset   Heart attack Mother    Diabetes Father    Hypertension Father     No Known Allergies  CBC Latest Ref Rng & Units 01/15/2021 01/14/2021 01/09/2021  WBC 4.0 - 10.5 K/uL 11.5(H) 13.1(H) 12.2(H)  Hemoglobin 13.0 - 17.0 g/dL 13.3 13.7 13.8  Hematocrit 39.0 - 52.0 % 36.3(L) 39.0 39.5  Platelets 150 - 400 K/uL 235 236 277      CMP     Component Value Date/Time   NA 127 (L) 01/15/2021 0343   NA 137 09/25/2020 1238   K 4.2 01/15/2021 0343   CL 98 01/15/2021 0343   CO2 21 (L) 01/15/2021 0343   GLUCOSE 149 (H) 01/15/2021 0343   BUN 10 01/15/2021 0343   BUN 11 09/25/2020 1238   CREATININE 0.51 (L) 01/15/2021 0343   CALCIUM 8.3 (L) 01/15/2021 0343   PROT 5.5 (L) 09/01/2020 0618   PROT 6.7 07/03/2019 0816   ALBUMIN 2.7 (L) 09/01/2020 0618   ALBUMIN 4.5 07/03/2019 0816   AST 19 09/01/2020 0618   ALT 28 09/01/2020 0618   ALKPHOS 27 (L) 09/01/2020 0618   BILITOT 0.8 09/01/2020 0618   BILITOT 0.8 07/03/2019 0816   GFRNONAA >60 01/15/2021 0343   GFRAA 124 07/03/2019 0816     No results found.     Assessment & Plan:   1. 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  The patient does not voice lifestyle limiting changes at this point in time.  Noninvasive studies do not suggest clinically significant change.  No invasive studies,  angiography or surgery at this time The patient should continue walking and begin a more formal exercise program.  The patient should continue antiplatelet therapy and aggressive treatment of the lipid abnormalities  No changes in the patient's medications at this time  The patient should continue wearing graduated compression socks 10-15 mmHg strength to control the mild edema.    2. Wound dehiscence The patient's previously wound dehiscence is resolved  3. Essential (primary) hypertension Continue antihypertensive medications as already ordered, these medications have been reviewed and there are no changes at this time.   4. Tobacco use disorder Smoking cessation was discussed, 3-10 minutes spent on this topic specifically    5. Tingling of both upper extremities The patient notes intermittent numbness and tingling of his upper extremities.  He denies any discoloration or pain in his fingers.  We discussed noninvasive studies.  Patient will wait till follow-up to have the studies will evaluate for possible arterial issues causing these symptoms.  Patient is advised if they worsen to contact us sooner for quicker follow-up.     Current Outpatient Medications on File Prior to Visit  Medication Sig Dispense Refill   atorvastatin (LIPITOR) 10 MG tablet Take 1 tablet (10 mg total) by mouth daily. 30 tablet 5   metoprolol tartrate (LOPRESSOR) 50 MG tablet TAKE 1.5 TABLETS BY MOUTH 2 TIMES DAILY. 270 tablet 0   Misc Natural Products (GLUCOSAMINE CHOND MSM FORMULA PO) Take 1 tablet by mouth daily.     Multiple Vitamins-Minerals (MULTIVITAMIN ADULT PO) Take 1 tablet by mouth daily.     rivaroxaban (XARELTO)  20 MG TABS tablet Take 1 tablet (20 mg total) by mouth daily with supper. 30 tablet 1   No current facility-administered medications on file prior to visit.    There are no Patient Instructions on file for this visit. No follow-ups on file.   Kris Hartmann, NP

## 2021-06-27 ENCOUNTER — Other Ambulatory Visit: Payer: Self-pay | Admitting: Cardiovascular Disease

## 2021-06-27 DIAGNOSIS — I4891 Unspecified atrial fibrillation: Secondary | ICD-10-CM

## 2021-06-29 NOTE — Telephone Encounter (Signed)
Refill Request.  

## 2021-06-29 NOTE — Telephone Encounter (Signed)
Xarelto 20mg  refill request received. Pt is 61 years old, weight-89.7kg, Crea-0.51 on 01/15/2021, last seen by Dr. [name] on 04/29/2021, Diagnosis-Afib, CrCl-192.23ml/min; Dose is appropriate based on dosing criteria. Will send in refill to requested pharmacy.

## 2021-08-26 ENCOUNTER — Other Ambulatory Visit: Payer: Self-pay | Admitting: Nurse Practitioner

## 2021-10-28 ENCOUNTER — Ambulatory Visit: Payer: 59 | Admitting: Cardiovascular Disease

## 2021-10-28 ENCOUNTER — Encounter: Payer: Self-pay | Admitting: Cardiovascular Disease

## 2021-10-28 VITALS — BP 130/88 | HR 66 | Ht 72.0 in | Wt 204.5 lb

## 2021-10-28 DIAGNOSIS — I10 Essential (primary) hypertension: Secondary | ICD-10-CM

## 2021-10-28 DIAGNOSIS — E785 Hyperlipidemia, unspecified: Secondary | ICD-10-CM

## 2021-10-28 DIAGNOSIS — I480 Paroxysmal atrial fibrillation: Secondary | ICD-10-CM | POA: Diagnosis not present

## 2021-10-28 DIAGNOSIS — Z720 Tobacco use: Secondary | ICD-10-CM | POA: Diagnosis not present

## 2021-10-28 DIAGNOSIS — I739 Peripheral vascular disease, unspecified: Secondary | ICD-10-CM

## 2021-10-28 NOTE — Progress Notes (Signed)
Cardiology Office Note   Date:  10/28/2021   ID:  Henry Hill, DOB 09-14-1960, MRN [id number]  PCP:  [name], MD  Cardiologist:  Dr. Rockey Situ  Chief Complaint  Patient presents with   Other    6 month f/u no complaints. Meds reviewed verbally with pt.      History of Present Illness: Henry Hill is a 61 y.o. male who is here today for follow-up visit regarding peripheral arterial disease.    He has known history of paroxysmal atrial fibrillation, essential hypertension, tobacco use, alcohol use, probable sleep apnea and varicose veins.  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  He had atrial fibrillation with RVR in that setting.  He converted sinus rhythm with metoprolol and diltiazem.  Echocardiogram showed an EF of 50 to 55%.  He was anticoagulated with Eliquis.  He had a right colectomy done with improvement.  He had subsequent Lexiscan Myoview in June, 2022 which showed no clear evidence of ischemia although the stress test was suboptimal.   He was seen last year for severe bilateral leg claudication and large ulceration on the medial side of the left ankle. Angiography was performed in August 2022 which showed no significant aortoiliac disease.  On the right side, there was severe heavily calcified subtotal occlusion of the common femoral artery extending into the ostium of both SFA and profunda with occluded distal SFA and three-vessel runoff below the knee.  On the left side, there was heavily calcified left common femoral artery stenosis extending into the ostium of the SFA with short occlusion of the proximal to mid SFA, occluded popliteal artery and three-vessel runoff below the knee.   He underwent bilateral common femoral artery endarterectomy in October.  He had some issues with swelling as well as wound dehiscence that subsequently resolved.    He has been doing well overall and denies chest pain, palpitations or  shortness of breath.  He reports stable bilateral calf claudication which is overall mild.  Unfortunately, he continues to smoke and reports inability to quit.   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 08/2020 s/p ex lap & R colectomy.   Chronic anticoagulation    Rivaroxaban   Diastolic dysfunction    a.) 08/2020 Echo: EF 50-55%, no rwma, Gr1 DD, nl RV size/fxn, triv MR.   Essential hypertension    ETOH abuse    Hypo-osmolality and hyponatremia    Persistent atrial fibrillation ([place])    a.) Dx 08/2020 in setting of cecal volvulus s/p R colectomy.  b.) CHA2DS'@VASc'$  =  2.   Tobacco abuse    Varicose veins of both lower extremities     Past Surgical History:  Procedure Laterality Date   ABDOMINAL AORTOGRAM W/LOWER EXTREMITY N/A 12/10/2020   Procedure: ABDOMINAL AORTOGRAM W/LOWER EXTREMITY;  Surgeon: [name], MD;  Location: [place];  Service: Cardiovascular;  Laterality: N/A;   BACK SURGERY  05/10/2001   COLONOSCOPY WITH PROPOFOL N/A 11/24/2020   Procedure: COLONOSCOPY WITH PROPOFOL;  Surgeon: [name], MD;  Location: [facility];  Service: Gastroenterology;  Laterality: N/A;   ENDARTERECTOMY FEMORAL Bilateral 01/14/2021   Procedure: ENDARTERECTOMY FEMORAL;  Surgeon: [name], MD;  Location: [facility];  Service: Vascular;  Laterality: Bilateral;  Bilateral femoral endarterectomies, COMMON, SFA, AND PROFUNDA  ARTERIES    HEMORRHOID SURGERY N/A 11/13/2014  Procedure: LEFT LATERAL HEMORRHOIDECTOMY  ;  Surgeon: [name], MD;  Location: [facility];  Service: General;  Laterality: N/A;   JOINT REPLACEMENT Left 2018   LAPAROTOMY Right 08/30/2020   Procedure: EXPLORATORY LAPAROTOMY WITH RIGHT COLOCTOMY;  Surgeon: [name], MD;  Location: [facility] OR[facility];  Service: General;  Laterality: Right;     Current Outpatient Medications  Medication Sig Dispense Refill   atorvastatin (LIPITOR) 10 MG tablet  Take 1 tablet (10 mg total) by mouth daily. 30 tablet 5   metoprolol tartrate (LOPRESSOR) 50 MG tablet TAKE 1.5 TABLETS BY MOUTH 2 TIMES DAILY. 270 tablet 0   Misc Natural Products (GLUCOSAMINE CHOND MSM FORMULA PO) Take 1 tablet by mouth daily.     Multiple Vitamins-Minerals (MULTIVITAMIN ADULT PO) Take 1 tablet by mouth daily.     rivaroxaban (XARELTO) 20 MG TABS tablet TAKE 1 TABLET BY MOUTH DAILY WITH SUPPER. 30 tablet 5   No current facility-administered medications for this visit.    Allergies:   Patient has no known allergies.    Social History:  The patient  reports that he has been smoking cigarettes. He has been smoking an average of .5 packs per day. He has never used smokeless tobacco. He reports current alcohol use of about 15.0 standard drinks of alcohol per week. He reports that he does not use drugs.   Family History:  The patient's family history includes Diabetes in his father; Heart attack in his mother; Hypertension in his father.    ROS:  Please see the history of present illness.   Otherwise, review of systems are positive for none.   All other systems are reviewed and negative.    PHYSICAL EXAM: VS:  BP 130/88 (BP Location: Left Arm, Patient Position: Sitting, Cuff Size: Normal)   Pulse 66   Ht 6' (1.829 m)   Wt 204 lb 8 oz (92.8 kg)   SpO2 98%   BMI 27.74 kg/m  , BMI Body mass index is 27.74 kg/m. GEN: Well nourished, well developed, in no acute distress  HEENT: normal  Neck: no JVD, carotid bruits, or masses Cardiac: RRR; no murmurs, rubs, or gallops,no edema  Respiratory:  clear to auscultation bilaterally, normal work of breathing GI: soft, nontender, nondistended, + BS MS: no deformity or atrophy  Skin: warm and dry, no rash Neuro:  Strength and sensation are intact Psych: euthymic mood, full affect Femoral pulses normal bilaterally.  EKG:  EKG is ordered today. EKG showed normal sinus rhythm with minimal LVH.   Recent Labs: 01/15/2021: BUN 10;  Creatinine, Ser 0.51; Hemoglobin 13.3; Platelets 235; Potassium 4.2; Sodium 127    Lipid Panel    Component Value Date/Time   CHOL 151 07/03/2019 0816   TRIG 48 09/01/2020 0619   HDL 100 07/03/2019 0816   CHOLHDL 1.5 07/03/2019 0816   LDLCALC 42 07/03/2019 0816      Wt Readings from Last 3 Encounters:  10/28/21 204 lb 8 oz (92.8 kg)  05/20/21 197 lb 12.8 oz (89.7 kg)  04/29/21 198 lb 2 oz (89.9 kg)          11/13/2020    3:13 PM  PAD Screen  Previous PAD dx? No  Previous surgical procedure? Yes  Dates of procedures knee replacement  Pain with walking? Yes  Subsides with rest? No  Feet/toe relief with dangling? No  Painful, non-healing ulcers? Yes  Extremities discolored? Yes      ASSESSMENT AND PLAN:  1.  Peripheral arterial disease :  Status post successful bilateral common femoral artery endarterectomy with resolution of claudication.  He has known occlusive disease affecting the SFA and popliteal arteries but his claudication is currently not lifestyle limiting.  Recommend continuing medical therapy.  He does have a follow-up appointment at [facility] in July with Doppler scheduled.  We can resume his vascular care after that.  2.  Paroxysmal atrial fibrillation: He is maintaining in sinus rhythm.  On anticoagulation with Xarelto.  3.  Hyperlipidemia: Even though his cholesterol is not very high, recommend continuing treatment with atorvastatin given his extensive peripheral arterial disease.  4.  Tobacco use: I discussed with him the importance of smoking cessation.  He cut down but has not been able to quit smoking completely.  5.  Essential hypertension: Blood pressure is controlled on current medications.   Disposition:   FU with me in 6 months.  Signed,  [name], MD  10/28/2021 10:04 AM    [place]

## 2021-10-28 NOTE — Patient Instructions (Signed)
Medication Instructions:  Your physician recommends that you continue on your current medications as directed. Please refer to the Current Medication list given to you today.  *If you need a refill on your cardiac medications before your next appointment, please call your pharmacy*   Lab Work: None ordered If you have labs (blood work) drawn today and your tests are completely normal, you will receive your results only by: [place] (if you have MyChart) OR A paper copy in the mail If you have any lab test that is abnormal or we need to change your treatment, we will call you to review the results.   Testing/Procedures: None ordered   Follow-Up: At [facility], you and your health needs are our priority.  As part of our continuing mission to provide you with exceptional heart care, we have created designated Provider Care Teams.  These Care Teams include your primary Cardiologist (physician) and Advanced Practice Providers (APPs -  Physician Assistants and Nurse Practitioners) who all work together to provide you with the care you need, when you need it.  We recommend signing up for the patient portal called "MyChart".  Sign up information is provided on this After Visit Summary.  MyChart is used to connect with patients for Virtual Visits (Telemedicine).  Patients are able to view lab/test results, encounter notes, upcoming appointments, etc.  Non-urgent messages can be sent to your provider as well.   To learn more about what you can do with MyChart, go to [email or web address].    Your next appointment:   6 month(s)  The format for your next appointment:   In Person  Provider:   [name], MD   Other Instructions N/A  Important Information About [place]

## 2021-11-05 ENCOUNTER — Other Ambulatory Visit: Payer: Self-pay | Admitting: Cardiovascular Disease

## 2021-11-17 ENCOUNTER — Ambulatory Visit (INDEPENDENT_AMBULATORY_CARE_PROVIDER_SITE_OTHER): Payer: 59 | Admitting: Nurse Practitioner

## 2021-11-17 ENCOUNTER — Other Ambulatory Visit (INDEPENDENT_AMBULATORY_CARE_PROVIDER_SITE_OTHER): Payer: 59

## 2021-11-17 ENCOUNTER — Encounter (INDEPENDENT_AMBULATORY_CARE_PROVIDER_SITE_OTHER): Payer: 59

## 2021-11-25 ENCOUNTER — Other Ambulatory Visit: Payer: Self-pay | Admitting: Cardiovascular Disease

## 2022-01-14 ENCOUNTER — Other Ambulatory Visit: Payer: Self-pay | Admitting: Cardiovascular Disease

## 2022-01-14 DIAGNOSIS — I4891 Unspecified atrial fibrillation: Secondary | ICD-10-CM

## 2022-01-14 NOTE — Telephone Encounter (Signed)
Prescription refill request for Xarelto received.  Indication:Afib Last office visit:6/23 Weight:92.8 kg Age:61 Scr:0.5 CrCl:203.64 ml/min  Prescription refilled

## 2022-01-14 NOTE — Telephone Encounter (Signed)
Refill request

## 2022-01-15 ENCOUNTER — Encounter (INDEPENDENT_AMBULATORY_CARE_PROVIDER_SITE_OTHER): Payer: 59

## 2022-01-15 ENCOUNTER — Ambulatory Visit (INDEPENDENT_AMBULATORY_CARE_PROVIDER_SITE_OTHER): Payer: 59 | Admitting: Nurse Practitioner

## 2022-01-15 ENCOUNTER — Other Ambulatory Visit (INDEPENDENT_AMBULATORY_CARE_PROVIDER_SITE_OTHER): Payer: 59

## 2022-01-18 ENCOUNTER — Telehealth: Payer: Self-pay | Admitting: Cardiovascular Disease

## 2022-01-18 NOTE — Telephone Encounter (Signed)
   Pre-operative Risk Assessment    Patient Name: Henry Hill  DOB: 1961-01-06 MRN: [id number]      Request for Surgical Clearance    Procedure:  Rt TKA tradtional knee  Date of Surgery:  Clearance 02/24/22                                 Surgeon:  Dr. Kurtis Bushman Surgeon's Group or Practice Name:  [facility] Phone number:  not listed Fax number:  not listed   Type of Clearance Requested:   Pharmacy, Xarelto   Type of Anesthesia:  local/Spinal   Additional requests/questions:    Signed, Pilar A Ham   01/18/2022, 12:31 PM

## 2022-01-20 NOTE — Telephone Encounter (Signed)
Patient with diagnosis of PAF on Xarelto for anticoagulation.    Procedure:  Rt TKA tradtional knee Date of procedure: 02/24/22   CHA2DS2-VASc Score = 2  This indicates a 2.2% annual risk of stroke. The patient's score is based upon: CHF History: 0 HTN History: 1 Diabetes History: 0 Stroke History: 0 Vascular Disease History: 1 Age Score: 0 Gender Score: 0   CrCl 199 mL/min Platelet count 235K  Per office protocol, patient can hold Xarelto for 3 days prior to procedure.    **This guidance is not considered finalized until pre-operative APP has relayed final recommendations.**

## 2022-01-21 ENCOUNTER — Telehealth: Payer: Self-pay

## 2022-01-21 NOTE — Telephone Encounter (Signed)
  Patient Consent for Virtual Visit        Henry Hill has provided verbal consent on 01/21/2022 for a virtual visit (video or telephone).   CONSENT FOR VIRTUAL VISIT FOR:  Henry Hill  By participating in this virtual visit I agree to the following:  I hereby voluntarily request, consent and authorize [facility] and its employed or contracted physicians, physician assistants, nurse practitioners or other licensed health care professionals (the Practitioner), to provide me with telemedicine health care services (the "Services") as deemed necessary by the treating Practitioner. I acknowledge and consent to receive the Services by the Practitioner via telemedicine. I understand that the telemedicine visit will involve communicating with the Practitioner through live audiovisual communication technology and the disclosure of certain medical information by electronic transmission. I acknowledge that I have been given the opportunity to request an in-person assessment or other available alternative prior to the telemedicine visit and am voluntarily participating in the telemedicine visit.  I understand that I have the right to withhold or withdraw my consent to the use of telemedicine in the course of my care at any time, without affecting my right to future care or treatment, and that the Practitioner or I may terminate the telemedicine visit at any time. I understand that I have the right to inspect all information obtained and/or recorded in the course of the telemedicine visit and may receive copies of available information for a reasonable fee.  I understand that some of the potential risks of receiving the Services via telemedicine include:  Delay or interruption in medical evaluation due to technological equipment failure or disruption; Information transmitted may not be sufficient (e.g. poor resolution of images) to allow for appropriate medical decision making by the Practitioner;  and/or  In rare instances, security protocols could fail, causing a breach of personal health information.  Furthermore, I acknowledge that it is my responsibility to provide information about my medical history, conditions and care that is complete and accurate to the best of my ability. I acknowledge that Practitioner's advice, recommendations, and/or decision may be based on factors not within their control, such as incomplete or inaccurate data provided by me or distortions of diagnostic images or specimens that may result from electronic transmissions. I understand that the practice of medicine is not an exact science and that Practitioner makes no warranties or guarantees regarding treatment outcomes. I acknowledge that a copy of this consent can be made available to me via my patient portal ([place]), or I can request a printed copy by calling the office of [facility].    I understand that my insurance will be billed for this visit.   I have read or had this consent read to me. I understand the contents of this consent, which adequately explains the benefits and risks of the Services being provided via telemedicine.  I have been provided ample opportunity to ask questions regarding this consent and the Services and have had my questions answered to my satisfaction. I give my informed consent for the services to be provided through the use of telemedicine in my medical care

## 2022-01-21 NOTE — Telephone Encounter (Signed)
The patient has been contacted and a telehealth appointment has been scheduled; medication has been reviewed.

## 2022-01-21 NOTE — Telephone Encounter (Signed)
   Name: Henry Hill  DOB: 1960-09-03  MRN: [id number]  Primary Cardiologist: [name], MD  Last [facility] OV 10/2021  Preoperative team, please contact this patient and set up a phone call appointment for further preoperative risk assessment. Please obtain consent and complete medication review. Thank you for your help.  I confirm that guidance regarding antiplatelet and oral anticoagulation therapy has been completed and, if necessary, noted below.   Charlie Pitter, PA-C 01/21/2022, 11:52 AM [place]

## 2022-02-07 NOTE — Progress Notes (Unsigned)
Virtual Visit via Telephone Note   Because of Henry Hill's co-morbid illnesses, he is at least at moderate risk for complications without adequate follow up.  This format is felt to be most appropriate for this patient at this time.  The patient did not have access to video technology/had technical difficulties with video requiring transitioning to audio format only (telephone).  All issues noted in this document were discussed and addressed.  No physical exam could be performed with this format.  Please refer to the patient's chart for his consent to telehealth for Henry Hill.  Evaluation Performed:  Preoperative cardiovascular risk assessment _____________   Date:  02/07/2022   Patient ID:  Henry Hill, DOB 12/16/1960, MRN [id number] Patient Location:  Home Provider location:   Office  Primary Care Provider:  [name], Hill Primary Cardiologist:  Henry Hill  Chief Complaint / Patient Profile   61 y.o. y/o male with a h/o PAD s/p bilateral common femoral artery endarterectomy, PAF on chronic anticoagulation, HTN, tobacco abuse, alcohol use, nuclear stress test 10/2020 which showed no clear evidence of ischemia although stress test was suboptimal, aortic atherosclerosis who is pending right total knee arthroplasty and presents today for telephonic preoperative cardiovascular risk assessment.  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 08/2020 s/p ex lap & R colectomy.   Chronic anticoagulation    Rivaroxaban   Diastolic dysfunction    a.) 08/2020 Echo: EF 50-55%, no rwma, Gr1 DD, nl RV size/fxn, triv MR.   Essential hypertension    ETOH abuse    Hypo-osmolality and hyponatremia    Persistent atrial fibrillation ([name])    a.) Dx 08/2020 in setting of cecal volvulus s/p R colectomy.  b.) CHA2DS'@VASc'$  =  2.   Tobacco abuse    Varicose veins of both lower extremities    Past  Surgical History:  Procedure Laterality Date   ABDOMINAL AORTOGRAM W/LOWER EXTREMITY N/A 12/10/2020   Procedure: ABDOMINAL AORTOGRAM W/LOWER EXTREMITY;  Surgeon: Henry Hampshire, Hill;  Location: [place] [facility];  Service: Cardiovascular;  Laterality: N/A;   BACK SURGERY  05/10/2001   COLONOSCOPY WITH PROPOFOL N/A 11/24/2020   Procedure: COLONOSCOPY WITH PROPOFOL;  Surgeon: Henry Landsman, Hill;  Location: [facility];  Service: Gastroenterology;  Laterality: N/A;   ENDARTERECTOMY FEMORAL Bilateral 01/14/2021   Procedure: ENDARTERECTOMY FEMORAL;  Surgeon: Henry Huxley, Hill;  Location: [facility] ORS;  Service: Vascular;  Laterality: Bilateral;  Bilateral femoral endarterectomies, COMMON, SFA, AND PROFUNDA  ARTERIES    HEMORRHOID SURGERY N/A 11/13/2014   Procedure: LEFT LATERAL HEMORRHOIDECTOMY  ;  Surgeon: Henry Skates, Hill;  Location: [facility];  Service: General;  Laterality: N/A;   JOINT REPLACEMENT Left 2018   LAPAROTOMY Right 08/30/2020   Procedure: EXPLORATORY LAPAROTOMY WITH RIGHT COLOCTOMY;  Surgeon: [name], Hill;  Location: [facility] ORS;  Service: General;  Laterality: Right;    Allergies  No Known Allergies  History of Present Illness    Henry Hill is a 61 y.o. male who presents via audio/video conferencing for a telehealth visit today.  Pt was last seen in cardiology clinic on 10/28/21 by Henry Hill.  At that time Henry Hill was doing well .  The patient is now pending procedure as outlined above. Since his last visit, he *** denies chest pain, shortness of breath, lower extremity edema, fatigue, palpitations, melena,  hematuria, hemoptysis, diaphoresis, weakness, presyncope, syncope, orthopnea, and PND.    Home Medications    Prior to Admission medications   Medication Sig Start Date End Date Taking? Authorizing Provider  atorvastatin (LIPITOR) 10 MG tablet TAKE 1 TABLET BY MOUTH EVERY DAY 11/05/21   Henry Hampshire, Hill  metoprolol tartrate (LOPRESSOR) 50 MG tablet TAKE  1.5 TABLETS BY MOUTH 2 TIMES DAILY. 11/25/21   Henry Hampshire, Hill  Misc Natural Products (GLUCOSAMINE CHOND MSM FORMULA PO) Take 1 tablet by mouth daily.    Provider, Historical, Hill  Multiple Vitamins-Minerals (MULTIVITAMIN ADULT PO) Take 1 tablet by mouth daily.    Provider, Historical, Hill  XARELTO 20 MG TABS tablet TAKE 1 TABLET BY MOUTH DAILY WITH SUPPER 01/14/22   Henry Hampshire, Hill    Physical Exam    Vital Signs:  Henry Hill does not have vital signs available for review today.***  Given telephonic nature of communication, physical exam is limited. AAOx3. NAD. Normal affect.  Speech and respirations are unlabored.  Accessory Clinical Findings    None  Assessment & Plan    1.  Preoperative Cardiovascular Risk Assessment:  The patient was advised that if he develops new symptoms prior to surgery to contact our office to arrange for a follow-up visit, and he verbalized understanding.  Per office protocol, patient can hold Xarelto for 3 days prior to procedure.      A copy of this note will be routed to requesting surgeon.  Time:   Today, I have spent *** minutes with the patient with telehealth technology discussing medical history, symptoms, and management plan.     Henry Life, NP  02/07/2022, 3:22 PM

## 2022-02-08 ENCOUNTER — Ambulatory Visit: Payer: 59 | Attending: Internal Medicine | Admitting: Nurse Practitioner

## 2022-02-08 ENCOUNTER — Encounter: Payer: Self-pay | Admitting: Nurse Practitioner

## 2022-02-08 DIAGNOSIS — Z01810 Encounter for preprocedural cardiovascular examination: Secondary | ICD-10-CM

## 2022-02-09 ENCOUNTER — Other Ambulatory Visit: Payer: Self-pay | Admitting: Orthopedic Surgery

## 2022-02-09 ENCOUNTER — Encounter: Payer: Self-pay | Admitting: Orthopedic Surgery

## 2022-02-09 DIAGNOSIS — Z01818 Encounter for other preprocedural examination: Secondary | ICD-10-CM

## 2022-02-09 NOTE — H&P (Deleted)
  The note originally documented on this encounter has been moved the the encounter in which it belongs.  

## 2022-02-09 NOTE — H&P (Signed)
Henry Hill MRN:  [id number] DOB/SEX:  18-Jan-1961/male  CHIEF COMPLAINT:  Painful right Knee  HISTORY: Patient is a 61 y.o. male presented with a history of pain in the right knee. Onset of symptoms was gradual starting several years ago with gradually worsening course since that time. Prior procedures on the knee include none. Patient has been treated conservatively with over-the-counter NSAIDs and activity modification. Patient currently rates pain in the knee at 10 out of 10 with activity. There is pain at night.  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[place]) 08/30/2020   Hx of total knee replacement, left 06/16/2017   Swelling of limb 11/23/2016   Internal and external thrombosed hemorrhoids 11/13/2014   Anal pain 11/08/2014   Osteoarthritis of knee 11/01/2014   Failure of erection 11/01/2014   Varicose veins of leg with pain, bilateral 11/01/2014   Hypo-osmolality and hyponatremia 10/14/2009   Avitaminosis D 10/14/2009   Decreased libido 09/11/2009   Malaise and fatigue 09/11/2009   Phobia 05/16/2007   Essential (primary) hypertension 02/04/2006   Past Medical History:  Diagnosis Date   Aortic atherosclerosis ([place])    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 08/2020 s/p ex lap & R colectomy.   Chronic anticoagulation    Rivaroxaban   Diastolic dysfunction    a.) 08/2020 Echo: EF 50-55%, no rwma, Gr1 DD, nl RV size/fxn, triv MR.   Essential hypertension    ETOH abuse    Hypo-osmolality and hyponatremia    Persistent atrial fibrillation ([place])    a.) Dx 08/2020 in setting of cecal volvulus s/p R colectomy.  b.)  CHA2DS'@VASc'$  =  2.   Tobacco abuse    Varicose veins of both lower extremities    Past Surgical History:  Procedure Laterality Date   ABDOMINAL AORTOGRAM W/LOWER EXTREMITY N/A 12/10/2020   Procedure: ABDOMINAL AORTOGRAM W/LOWER EXTREMITY;  Surgeon: [name], MD;  Location: [place] [facility];  Service: Cardiovascular;  Laterality: N/A;   BACK SURGERY  05/10/2001   COLONOSCOPY WITH PROPOFOL N/A 11/24/2020   Procedure: COLONOSCOPY WITH PROPOFOL;  Surgeon: [name], MD;  Location: [facility];  Service: Gastroenterology;  Laterality: N/A;   ENDARTERECTOMY FEMORAL Bilateral 01/14/2021   Procedure: ENDARTERECTOMY FEMORAL;  Surgeon: [name], MD;  Location: [facility] ORS;  Service: Vascular;  Laterality: Bilateral;  Bilateral femoral endarterectomies, COMMON, SFA, AND PROFUNDA  ARTERIES    HEMORRHOID SURGERY N/A 11/13/2014   Procedure: LEFT LATERAL HEMORRHOIDECTOMY  ;  Surgeon: [name], MD;  Location: [facility];  Service: General;  Laterality: N/A;   JOINT REPLACEMENT Left 2018   LAPAROTOMY Right 08/30/2020   Procedure: EXPLORATORY LAPAROTOMY WITH RIGHT COLOCTOMY;  Surgeon: [name], MD;  Location: [facility];  Service: General;  Laterality: Right;     MEDICATIONS:  (Not in a hospital admission)   ALLERGIES:  No Known Allergies  REVIEW OF SYSTEMS:  Pertinent items are noted in HPI.   FAMILY HISTORY:   Family History  Problem Relation Age of Onset   Heart attack Mother    Diabetes Father    Hypertension Father     SOCIAL HISTORY:   Social History   Tobacco  Use   Smoking status: Every Day    Packs/day: 0.50    Types: Cigarettes   Smokeless tobacco: Never  Substance Use Topics   Alcohol use: Yes    Alcohol/week: 15.0 standard drinks of alcohol    Types: 15 Cans of beer per week    Comment: MODERATE USE     EXAMINATION:  Vital signs in last 24 hours: '@VSRANGES'$ @  General appearance: alert, cooperative, and no distress Neck: no JVD and supple,  symmetrical, trachea midline Lungs: clear to auscultation bilaterally Heart: regular rate and rhythm, S1, S2 normal, no murmur, click, rub or gallop Abdomen: soft, non-tender; bowel sounds normal; no masses,  no organomegaly Extremities: extremities normal, atraumatic, no cyanosis or edema and Homans sign is negative, no sign of DVT Pulses: 2+ and symmetric Skin: Skin color, texture, turgor normal. No rashes or lesions Neurologic: Alert and oriented X 3, normal strength and tone. Normal symmetric reflexes. Normal coordination and gait  Musculoskeletal:  ROM 0-110, Ligaments intact,  Imaging Review Plain radiographs demonstrate severe degenerative joint disease of the right knee. The overall alignment is significant varus. The bone quality appears to be good for age and reported activity level.  Assessment/Plan: Primary osteoarthritis, right knee   The patient history, physical examination and imaging studies are consistent with advanced degenerative joint disease of the right knee. The patient has failed conservative treatment.  The clearance notes were reviewed.  After discussion with the patient it was felt that Total Knee Replacement was indicated. The procedure,  risks, and benefits of total knee arthroplasty were presented and reviewed. The risks including but not limited to aseptic loosening, infection, blood clots, vascular injury, stiffness, patella tracking problems complications among others were discussed. The patient acknowledged the explanation, agreed to proceed with the plan.  [name] 02/09/2022, 7:42 AM

## 2022-02-18 ENCOUNTER — Other Ambulatory Visit: Payer: Self-pay

## 2022-02-18 ENCOUNTER — Encounter
Admission: RE | Admit: 2022-02-18 | Discharge: 2022-02-18 | Disposition: A | Discharge status: Home / Self Care | Payer: 59 | Source: Ambulatory Visit | Attending: Orthopedic Surgery | Admitting: Orthopedic Surgery

## 2022-02-18 VITALS — BP 168/85 | HR 69 | Resp 16 | Ht 72.0 in | Wt 205.0 lb

## 2022-02-18 DIAGNOSIS — Z0000 Encounter for general adult medical examination without abnormal findings: Secondary | ICD-10-CM

## 2022-02-18 DIAGNOSIS — Z01818 Encounter for other preprocedural examination: Secondary | ICD-10-CM

## 2022-02-18 DIAGNOSIS — Z01812 Encounter for preprocedural laboratory examination: Secondary | ICD-10-CM | POA: Diagnosis present

## 2022-02-18 LAB — URINALYSIS, ROUTINE W REFLEX MICROSCOPIC
Bacteria, UA: NONE SEEN
Bilirubin Urine: NEGATIVE
Glucose, UA: 150 mg/dL — AB
Ketones, ur: NEGATIVE mg/dL
Leukocytes,Ua: NEGATIVE
Nitrite: NEGATIVE
Protein, ur: NEGATIVE mg/dL
Specific Gravity, Urine: 1.008 (ref 1.005–1.030)
Squamous Epithelial / HPF: NONE SEEN (ref 0–5)
WBC, UA: NONE SEEN WBC/hpf (ref 0–5)
pH: 6 (ref 5.0–8.0)

## 2022-02-18 LAB — SURGICAL PCR SCREEN
MRSA, PCR: NEGATIVE
Staphylococcus aureus: NEGATIVE

## 2022-02-18 LAB — COMPREHENSIVE METABOLIC PANEL
ALT: 17 U/L (ref 0–44)
AST: 22 U/L (ref 15–41)
Albumin: 3.9 g/dL (ref 3.5–5.0)
Alkaline Phosphatase: 71 U/L (ref 38–126)
Anion gap: 7 (ref 5–15)
BUN: 12 mg/dL (ref 8–23)
CO2: 25 mmol/L (ref 22–32)
Calcium: 8.7 mg/dL — ABNORMAL LOW (ref 8.9–10.3)
Chloride: 97 mmol/L — ABNORMAL LOW (ref 98–111)
Creatinine, Ser: 0.61 mg/dL (ref 0.61–1.24)
GFR, Estimated: >60 mL/min (ref >60)
Glucose, Bld: 79 mg/dL (ref 70–99)
Potassium: 4.3 mmol/L (ref 3.5–5.1)
Sodium: 129 mmol/L — ABNORMAL LOW (ref 135–145)
Total Bilirubin: 0.8 mg/dL (ref 0.3–1.2)
Total Protein: 6.6 g/dL (ref 6.5–8.1)

## 2022-02-18 LAB — TYPE AND SCREEN
ABO/RH(D): O POS
Antibody Screen: NEGATIVE

## 2022-02-18 LAB — CBC
HCT: 40.3 % (ref 39.0–52.0)
Hemoglobin: 14 g/dL (ref 13.0–17.0)
MCH: 33.9 pg (ref 26.0–34.0)
MCHC: 34.7 g/dL (ref 30.0–36.0)
MCV: 97.6 fL (ref 80.0–100.0)
Platelets: 284 10*3/uL (ref 150–400)
RBC: 4.13 MIL/uL — ABNORMAL LOW (ref 4.22–5.81)
RDW: 12.6 % (ref 11.5–15.5)
WBC: 11 10*3/uL — ABNORMAL HIGH (ref 4.0–10.5)
nRBC: 0 % (ref 0.0–0.2)

## 2022-02-18 LAB — PSA: Prostatic Specific Antigen: 1.67 ng/mL (ref 0.00–4.00)

## 2022-02-18 NOTE — Patient Instructions (Signed)
Your procedure is scheduled on: 03/02/22 Report to [place]. To find out your arrival time please call [phone] between 1PM - 3PM on 03/01/22.  Remember: Instructions that are not followed completely may result in serious medical risk, up to and including death, or upon the discretion of your surgeon and anesthesiologist your surgery may need to be rescheduled.     _X__ 1. Do not eat food after midnight the night before your procedure.                 No gum chewing or hard candies.   __X__2.  On the morning of surgery brush your teeth with toothpaste and water, you                 may rinse your mouth with mouthwash if you wish.  Do not swallow any              toothpaste of mouthwash.     _X__ 3.  No Alcohol for 24 hours before or after surgery.   _X__ 4.  Do Not Smoke or use e-cigarettes For 24 Hours Prior to Your Surgery.                 Do not use any chewable tobacco products for at least 6 hours prior to                 surgery.  ____  5.  Bring all medications with you on the day of surgery if instructed.   __X__  6.  Notify your doctor if there is any change in your medical condition      (cold, fever, infections).     Do not wear jewelry, make-up, hairpins, clips or nail polish. Do not wear lotions, powders, or perfumes.  Do not shave 48 hours prior to surgery. Men may shave face and neck. Do not bring valuables to the hospital.    [facility] is not responsible for any belongings or valuables.  Contacts, dentures/partials or body piercings may not be worn into surgery. Bring a case for your contacts, glasses or hearing aids, a denture cup will be supplied. Leave your suitcase in the car. After surgery it may be brought to your room. For patients admitted to the hospital, discharge time is determined by your treatment team.   Patients discharged the day of surgery will not be allowed to drive home.   Please  read over the following fact sheets that you were given:   MRSA Information, CHG soap, Incentive Spirometer  __X__ Take these medicines the morning of surgery with A SIP OF WATER:    1. metoprolol tartrate (LOPRESSOR) 50 MG tablet  (1.5 tabs)  2. atorvastatin (LIPITOR) 10 MG tablet  3.   4.  5.  6.  ____ Fleet Enema (as directed)   __X__ Use CHG Soap/SAGE wipes as directed  ____ Use inhalers on the day of surgery  ____ Stop metformin/Janumet/Farxiga 2 days prior to surgery    ____ Take 1/2 of usual insulin dose the night before surgery. No insulin the morning          of surgery.   __X__ Stop Blood Thinners (ELIQUIS) 3 DAYS PRIOR TO SURGERY. LAST DOSE WILL BE 02/27/22 SATURDAY  __X__ Stop Anti-inflammatories 7 days before surgery such as Advil, Ibuprofen, Motrin,  BC or Goodies Powder, Naprosyn, Naproxen, Aleve, Aspirin   You may use Tylenol if needed  __X__  Stop all herbals and supplements, fish oil or vitamins  until after surgery.    ____ Bring C-Pap to the hospital.

## 2022-02-23 NOTE — TOC Progression Note (Signed)
Transition of Care [facility]) - Progression Note    Patient Details  Name: Henry Hill MRN: [id number] Date of Birth: June 25, 1960  Transition of [facility]) CM/SW [place], RN Phone Number: 02/23/2022, 3:16 PM  Clinical Narrative:    Requested a 3 in 1 to be delivered to the patient's home by [employer] prior to surgery        Expected Discharge Plan and Services                                                 Social Determinants of Health (SDOH) Interventions    Readmission Risk Interventions     No data to display

## 2022-02-25 ENCOUNTER — Encounter: Payer: Self-pay | Admitting: Orthopedic Surgery

## 2022-02-25 NOTE — Progress Notes (Signed)
Perioperative Services  Pre-Admission/Anesthesia Testing Clinical Review  Date: 03/01/22  Patient Demographics:  Name: Henry Hill DOB:   [birth year] MRN:   [id number]  Planned Surgical Procedure(s):    Case: [id number] Date/Time: 03/02/22 0715   Procedure: TOTAL KNEE ARTHROPLASTY (Right: Knee)   Anesthesia type: Spinal   Pre-op diagnosis: M17.11 Unilateral primary osteoarthritis, right knee   Location: [facility] OR ROOM 02 / [place] [facility]S FOR ANESTHESIA GROUP   Surgeons: Lovell Sheehan, MD   NOTE: Available PAT nursing documentation and vital signs have been reviewed. Clinical nursing staff has updated patient's PMH/PSHx, current medication list, and drug allergies/intolerances to ensure comprehensive history available to assist in medical decision making as it pertains to the aforementioned surgical procedure and anticipated anesthetic course. Extensive review of available clinical information performed. Henry Hill PMH and PSHx updated with any diagnoses/procedures that  may have been inadvertently omitted during his intake with the pre-admission testing department's nursing staff.  Clinical Discussion:  Henry Hill is a 61 y.o. male who is submitted for pre-surgical anesthesia review and clearance prior to him undergoing the above procedure. Patient is a Current Smoker. Pertinent PMH includes: atrial fibrillation, diastolic dysfunction, PVD, aortic atherosclerosis, HTN, HLD, OA, ETOH and tobacco abuse.  Patient is followed by cardiology Henry Anon, MD). He was last seen in the cardiology clinic on 10/28/2021; notes reviewed.  At the time of this clinic visit, patient doing well overall from a cardiovascular perspective.  He denied any episodes of chest pain, shortness of breath, PND, orthopnea, significant peripheral edema, vertiginous symptoms, or presyncope/syncope.  Patient with intermittent episodes of BILATERAL lower extremity claudication associated with ambulation.  Patient reported symptoms  to be mild in nature and at baseline. Patient with a past medical history significant for cardiovascular diagnoses.  TTE performed on 08/31/2020 revealed a normal left ventricular systolic function with mild LVH.  LVEF 50-55%.  Diastolic parameters consistent with impaired relaxation (G1DD).  There was trivial mitral valve regurgitation noted.  No evidence of mitral or aortic valve stenosis.   Myocardial perfusion imaging study performed on 10/09/2020 revealed a low normal LVEF of 50%.  Attenuation images show significant aortic and coronary calcifications.  There was a small perfusion defect noted in the apex location likely due to apical thinning artifact.  Study determined to be suboptimal due to intense GI uptake and diaphragmatic attenuation.  There was no stress-induced arrhythmia noted. Study determined to be normal and low risk.   Lower extremity arterial duplex performed on 11/03/2020 revealed a 50-74% stenosis of the RIGHT common femoral artery and 75-99% stenosis of the LEFT common femoral artery.  Heavy irregular calcified plaques noted in the CFA, proximal PFA, and SFA bilaterally.  Short segment popliteal artery occlusion could not be excluded on the LEFT.   Vascular ABI/TBI studies performed on 11/03/2020 revealing moderate lower extremity arterial disease BILATERALLY.  TBIs were normal BILATERALLY.   Abdominal aortogram was performed on 12/10/2020 revealing no significant aortoiliac disease.  There was severe subtotal occlusion of the CFA extending into the ostium in both SFA and profunda, in addition to an occluded distal SFA with three-vessel runoff below the knee noted in the RIGHT lower extremity.  Contralaterally on the LEFT, significant heavily calcified CFA stenosis extending into the ostium of the SFA, in addition to short occlusion of the proximal to mid SFA and occluded proximal popliteal artery and three-vessel runoff below the knee.  Patient underwent BILATERAL femoral  endarterectomies on 01/14/2021.  Patient with an atrial fibrillation diagnosis;  CHA2DS2-VASc Score = 2 (age, vascular disease history). His rate and rhythm are currently being maintained on oral metoprolol tartrate. He is chronically anticoagulated using rivaroxaban; reported to be compliant with therapy with no evidence or reports of GI bleeding.  Blood pressure reasonably controlled at 130/88 mmHg on currently prescribed beta-blocker (metoprolol tartrate) monotherapy. He is on a atorvastatin for his HLD diagnosis and further ASCVD prevention.  Patient is not diabetic. Patient does not have an OSAH diagnosis.  No changes were made to his medication regimen.  Patient to follow-up with outpatient cardiology in 6 months or sooner if needed.  Henry Hill is scheduled for an elective RIGHT TOTAL KNEE ARTHROPLASTY on 07/03/2021 with Dr. Kurtis Bushman, MD.  Given patient's past medical history significant for cardiovascular diagnoses, presurgical cardiac clearance was sought by the PAT team. Per cardiology, "based ACC/AHA guidelines, the patient's past medical history, and the amount of time since his last clinic visit, this patient would be at an overall ACCEPTABLE risk for the planned procedure without further cardiovascular testing or intervention at this time".  Again, this patient is on daily anticoagulation therapy.  She has been instructed on recommendations from her primary cardiology team for holding her daily rivaroxaban dose for 3 days prior to her procedure with plans to restart as soon as postoperative bleeding is felt to be minimized by her primary attending surgeon.  The patient is aware that her last dose of rivaroxaban should be on 02/26/2022.  Patient denies previous perioperative complications with anesthesia in the past. In review of the available records, it is noted that patient underwent a general anesthetic course here at [facility] ([facility]) in 01/2021  without documented complications.      02/18/2022    9:57 AM 10/28/2021    9:58 AM 05/20/2021    3:25 PM  Vitals with BMI  Height '6\' 0"'$  '6\' 0"'$  '6\' 0"'$   Weight 205 lbs 204 lbs 8 oz 197 lbs 13 oz  BMI 27.8 74.25 95.63  Systolic 875 643 329  Diastolic 85 88 92  Pulse 69 66     Providers/Specialists:   NOTE: Primary physician provider listed below. Patient may have been seen by APP or partner within same practice.   PROVIDER ROLE / SPECIALTY LAST Jayme Cloud, MD Orthopedics (Surgeon) 02/09/2022   [name], MD Primary Care Provider 01/26/2022  [name], MD Cardiology 10/28/2021   Allergies:  Patient has no known allergies.  Current Home Medications:   No current facility-administered medications for this encounter.    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 08/2020 s/p ex lap & R colectomy.   Diastolic dysfunction    a.) 08/2020 Echo: EF 50-55%, no rwma, Gr1 DD, nl RV size/fxn, triv MR.   Essential hypertension    ETOH abuse    HLD (hyperlipidemia)    Hypo-osmolality and hyponatremia    Long term current use of anticoagulant    a.) rivaroxaban   OA (osteoarthritis)    Persistent atrial fibrillation ([place])    a.) Dx 08/2020 in setting of cecal volvulus s/p R colectomy. b.) CHA2DS2VASc = 2 (HTN, vascular disease history);  c.) rate/rhythm maintained on oral metoprolol tartrate; chronically anticoagulated  with rivaroxaban   PVD (peripheral vascular disease) (HCC)    Tobacco abuse    Varicose veins of both lower extremities    Past Surgical History:  Procedure Laterality Date   ABDOMINAL AORTOGRAM W/LOWER EXTREMITY N/A 12/10/2020   Procedure: ABDOMINAL AORTOGRAM W/LOWER EXTREMITY;  Surgeon: [name], MD;   Location: [facility];  Service: Cardiovascular;  Laterality: N/A;   BACK SURGERY  05/10/2001   COLONOSCOPY WITH PROPOFOL N/A 11/24/2020   Procedure: COLONOSCOPY WITH PROPOFOL;  Surgeon: [name], MD;  Location: [facility] ENDOSCOPY;  Service: Gastroenterology;  Laterality: N/A;   ENDARTERECTOMY FEMORAL Bilateral 01/14/2021   Procedure: ENDARTERECTOMY FEMORAL;  Surgeon: [name], MD;  Location: [facility] ORS;  Service: Vascular;  Laterality: Bilateral;  Bilateral femoral endarterectomies, COMMON, SFA, AND PROFUNDA  ARTERIES    HEMORRHOID SURGERY N/A 11/13/2014   Procedure: LEFT LATERAL HEMORRHOIDECTOMY  ;  Surgeon: [name], MD;  Location: [facility] ORS;  Service: General;  Laterality: N/A;   JOINT REPLACEMENT Left 2018   LAPAROTOMY Right 08/30/2020   Procedure: EXPLORATORY LAPAROTOMY WITH RIGHT COLOCTOMY;  Surgeon: [name], MD;  Location: [facility] ORS;  Service: General;  Laterality: Right;   Family History  Problem Relation Age of Onset   Heart attack Mother    Diabetes Father    Hypertension Father    Social History   Tobacco Use   Smoking status: Every Day    Packs/day: 0.50    Types: Cigarettes   Smokeless tobacco: Never  Vaping Use   Vaping Use: Never used  Substance Use Topics   Alcohol use: Yes    Alcohol/week: 35.0 - 42.0 standard drinks of alcohol    Types: 35 - 42 Cans of beer per week    Comment: MODERATE USE   Drug use: No    Pertinent Clinical Results:  LABS: Labs reviewed: Acceptable for surgery.  Component Date Value Ref Range Status   WBC 02/18/2022 11.0 (H)  4.0 - 10.5 K/uL Final   RBC 02/18/2022 4.13 (L)  4.22 - 5.81 MIL/uL Final   Hemoglobin 02/18/2022 14.0  13.0 - 17.0 g/dL Final   HCT 02/18/2022 40.3  39.0 - 52.0 % Final   MCV 02/18/2022 97.6  80.0 - 100.0 fL Final   MCH 02/18/2022 33.9  26.0 - 34.0 pg Final   MCHC 02/18/2022 34.7  30.0 - 36.0 g/dL Final   RDW 02/18/2022 12.6  11.5 - 15.5 % Final   Platelets 02/18/2022 284  150 - 400 K/uL  Final   nRBC 02/18/2022 0.0  0.0 - 0.2 % Final   Performed at [facility], [place], [place] [address]   Color, Urine 02/18/2022 STRAW (A)  YELLOW Final   APPearance 02/18/2022 CLEAR (A)  CLEAR Final   Specific Gravity, Urine 02/18/2022 1.008  1.005 - 1.030 Final   pH 02/18/2022 6.0  5.0 - 8.0 Final   Glucose, UA 02/18/2022 150 (A)  NEGATIVE mg/dL Final   Hgb urine dipstick 02/18/2022 SMALL (A)  NEGATIVE Final   Bilirubin Urine 02/18/2022 NEGATIVE  NEGATIVE Final   Ketones, ur 02/18/2022 NEGATIVE  NEGATIVE mg/dL Final   Protein, ur 02/18/2022 NEGATIVE  NEGATIVE mg/dL Final   Nitrite 02/18/2022 NEGATIVE  NEGATIVE Final   Leukocytes,Ua 02/18/2022 NEGATIVE  NEGATIVE Final   RBC / HPF 02/18/2022 0-5  0 - 5 RBC/hpf Final   WBC, UA 02/18/2022 NONE SEEN  0 - 5 WBC/hpf Final   Bacteria, UA 02/18/2022 NONE SEEN  NONE  SEEN Final   Squamous Epithelial / LPF 02/18/2022 NONE SEEN  0 - 5 Final   Performed at [facility], [address] [place], [place] [address]   ABO/RH(D) 02/18/2022 O POS   Final   Antibody Screen 02/18/2022 NEG   Final   Sample Expiration 02/18/2022 03/04/2022,2359   Final   Extend sample reason 02/18/2022    Final                   Value:NO TRANSFUSIONS OR PREGNANCY IN THE PAST 3 MONTHS Performed at [facility], [address] [place], [place] [address]    MRSA, PCR 02/18/2022 NEGATIVE  NEGATIVE Final   Staphylococcus aureus 02/18/2022 NEGATIVE  NEGATIVE Final   Comment: (NOTE) The Xpert SA Assay (FDA approved for NASAL specimens in patients 73 years of age and older), is one component of a comprehensive surveillance program. It is not intended to diagnose infection nor to guide or monitor treatment. Performed at [facility], [place], [place] [address]    Sodium 02/18/2022 129 (L)  135 - 145 mmol/L Final   Potassium 02/18/2022 4.3  3.5 - 5.1 mmol/L Final   Chloride 02/18/2022 97 (L)  98 - 111  mmol/L Final   CO2 02/18/2022 25  22 - 32 mmol/L Final   Glucose, Bld 02/18/2022 79  70 - 99 mg/dL Final   Glucose reference range applies only to samples taken after fasting for at least 8 hours.   BUN 02/18/2022 12  8 - 23 mg/dL Final   Creatinine, Ser 02/18/2022 0.61  0.61 - 1.24 mg/dL Final   Calcium 02/18/2022 8.7 (L)  8.9 - 10.3 mg/dL Final   Total Protein 02/18/2022 6.6  6.5 - 8.1 g/dL Final   Albumin 02/18/2022 3.9  3.5 - 5.0 g/dL Final   AST 02/18/2022 22  15 - 41 U/L Final   ALT 02/18/2022 17  0 - 44 U/L Final   Alkaline Phosphatase 02/18/2022 71  38 - 126 U/L Final   Total Bilirubin 02/18/2022 0.8  0.3 - 1.2 mg/dL Final   GFR, Estimated 02/18/2022 >60  >60 mL/min Final   Comment: (NOTE) Calculated using the CKD-EPI Creatinine Equation (2021)    Anion gap 02/18/2022 7  5 - 15 Final   Performed at [facility], [place], [place] [address]   Prostatic Specific Antigen 02/18/2022 1.67  0.00 - 4.00 ng/mL Final   Comment: (NOTE) While PSA levels of <=4.00 ng/ml are reported as reference range, some men with levels below 4.00 ng/ml can have prostate cancer and many men with PSA above 4.00 ng/ml do not have prostate cancer.  Other tests such as free PSA, age specific reference ranges, PSA velocity and PSA doubling time may be helpful especially in men less than 21 years old. Performed at [facility], [place] [address], [place], [place] [address]     ECG: Date: 10/28/2021 Time ECG obtained: 1000 AM Rate: 66 bpm Rhythm: normal sinus Axis (leads I and aVF): Normal Intervals: PR 186 ms. QRS 92 ms. QTc 429 ms. ST segment and T wave changes: No evidence of acute ST segment elevation or depression Comparison: Similar to previous tracing obtained on 05/06/2021   IMAGING / PROCEDURES: VAS [place] ABI WITH/WO TBI 05/20/2021 Right: Resting right ankle-brachial index indicates moderate right lower extremity arterial disease. The right toe-brachial index is  abnormal. Left: Resting left ankle-brachial index indicates moderate left lower extremity arterial disease. The  left toe-brachial index is abnormal.   ABDOMINAL AORTOGRAM performed on 12/10/2020 No significant aortoiliac disease Right lower extremity:  Severe heavily calcified subtotal occlusion of the common femoral artery extending into the ostium of both SFA and profunda, occluded distal SFA with three-vessel runoff below the knee Left lower extremity:  Significant heavily calcified left common femoral artery stenosis extending into the ostium of the SFA, short occlusion of the proximal to mid SFA, occluded proximal popliteal artery and three-vessel runoff below the knee. Recommendations: Bilateral common femoral artery endarterectomy to improve claudication.   Consider starting with the left side given that he is more symptomatic on that side.   LOWER EXTREMITY ARTERIAL DUPLEX performed on 11/03/2020 Right:  50-74% stenosis noted in the common femoral artery.  Heavy irregular calcified plaque in the CFA and proximal PFA and SFA, with shadowing.  Cannot exclude a tighter stenosis in the CFA, and possible short segment occlusion in the distal SFA/popliteal. Left:  75-99% stenosis noted in the common femoral artery.  Heavy irregular calcified plaque in the CFA and proximal PFA and SFA, with shadowing.  Flow disturbance in distal SFA difficult to characterize, with multiple  collaterals in the distal SFA and  popliteal region.  Cannot exclude short segment popliteal artery occlusion.    MYOCARDIAL PERFUSION IMAGING STUDY (LEXISCAN) performed on 10/09/2020 Low normal left ventricular systolic function with an EF of 50% No T wave inversion noted during stress CT attenuation images show significant aortic and coronary calcifications There is a small defect of mild severity present in the apex location likely due to apical thinning artifact Very suboptimal study due to intense GI uptake and  diaphragmatic attenuation.  Consider alternative testing of suspicion for obstructive CAD is high. Normal low risk study   TRANSTHORACIC ECHOCARDIOGRAM performed on 08/31/2020 Left ventricular ejection fraction, by estimation, is 50 to 55%. The left ventricle has low normal function. The left ventricle has no regional wall motion abnormalities. There is mild concentric left ventricular hypertrophy. Left ventricular diastolic parameters are consistent with Grade I diastolic dysfunction (impaired relaxation).  Right ventricular systolic function is normal. The right ventricular size is normal.  The mitral valve is normal in structure. Trivial mitral valve regurgitation. No evidence of mitral stenosis.  The aortic valve is normal in structure. Aortic valve regurgitation is not visualized. No aortic stenosis is present.  The inferior vena cava is normal in size with greater than 50% respiratory variability, suggesting right atrial pressure of 3 mmHg.   Impression and Plan:  BRALON ANTKOWIAK has been referred for pre-anesthesia review and clearance prior to him undergoing the planned anesthetic and procedural courses. Available labs, pertinent testing, and imaging results were personally reviewed by me. This patient has been appropriately cleared by cardiology with an overall ACCEPTABLE risk of significant perioperative cardiovascular complications.  Based on clinical review performed today (03/01/22), barring any significant acute changes in the patient's overall condition, it is anticipated that he will be able to proceed with the planned surgical intervention. Any acute changes in clinical condition may necessitate his procedure being postponed and/or cancelled. Patient will meet with anesthesia team (MD and/or CRNA) on the day of his procedure for preoperative evaluation/assessment. Questions regarding anesthetic course will be fielded at that time.   Pre-surgical instructions were reviewed with the  patient during his PAT appointment and questions were fielded by PAT clinical staff. Patient was advised that if any questions or concerns arise prior to his procedure then he should return a call to  PAT and/or his surgeon's office to discuss.  Honor Loh, MSN, APRN, FNP-C, CEN [facility]  Peri-operative Services Nurse Practitioner Phone: [phone] Fax: [phone] 03/01/22 8:37 AM  NOTE: This note has been prepared using Dragon dictation software. Despite my best ability to proofread, there is always the potential that unintentional transcriptional errors may still occur from this process.

## 2022-03-02 ENCOUNTER — Ambulatory Visit
Admission: RE | Admit: 2022-03-02 | Discharge: 2022-03-02 | Disposition: A | Discharge status: Home / Self Care | Payer: 59 | Attending: Orthopedic Surgery | Admitting: Orthopedic Surgery

## 2022-03-02 ENCOUNTER — Ambulatory Visit: Payer: 59

## 2022-03-02 ENCOUNTER — Ambulatory Visit: Payer: 59 | Admitting: Urgent Care

## 2022-03-02 ENCOUNTER — Encounter
Admission: RE | Disposition: A | Discharge status: Home / Self Care | Payer: Self-pay | Source: Home / Self Care | Attending: Orthopedic Surgery

## 2022-03-02 ENCOUNTER — Encounter: Payer: Self-pay | Admitting: Orthopedic Surgery

## 2022-03-02 ENCOUNTER — Other Ambulatory Visit: Payer: Self-pay

## 2022-03-02 DIAGNOSIS — Z7901 Long term (current) use of anticoagulants: Secondary | ICD-10-CM | POA: Insufficient documentation

## 2022-03-02 DIAGNOSIS — I739 Peripheral vascular disease, unspecified: Secondary | ICD-10-CM | POA: Insufficient documentation

## 2022-03-02 DIAGNOSIS — I4819 Other persistent atrial fibrillation: Secondary | ICD-10-CM | POA: Insufficient documentation

## 2022-03-02 DIAGNOSIS — I10 Essential (primary) hypertension: Secondary | ICD-10-CM | POA: Diagnosis not present

## 2022-03-02 DIAGNOSIS — Z79899 Other long term (current) drug therapy: Secondary | ICD-10-CM | POA: Insufficient documentation

## 2022-03-02 DIAGNOSIS — Z9049 Acquired absence of other specified parts of digestive tract: Secondary | ICD-10-CM | POA: Insufficient documentation

## 2022-03-02 DIAGNOSIS — I700 Atherosclerosis of aorta: Secondary | ICD-10-CM | POA: Diagnosis not present

## 2022-03-02 DIAGNOSIS — F17210 Nicotine dependence, cigarettes, uncomplicated: Secondary | ICD-10-CM | POA: Insufficient documentation

## 2022-03-02 DIAGNOSIS — M1711 Unilateral primary osteoarthritis, right knee: Secondary | ICD-10-CM | POA: Diagnosis present

## 2022-03-02 DIAGNOSIS — E785 Hyperlipidemia, unspecified: Secondary | ICD-10-CM | POA: Insufficient documentation

## 2022-03-02 HISTORY — DX: Unspecified osteoarthritis, unspecified site: M19.90

## 2022-03-02 HISTORY — DX: Hyperlipidemia, unspecified: E78.5

## 2022-03-02 HISTORY — PX: TOTAL KNEE ARTHROPLASTY: SHX125

## 2022-03-02 HISTORY — DX: Long term (current) use of anticoagulants: Z79.01

## 2022-03-02 HISTORY — DX: Peripheral vascular disease, unspecified: I73.9

## 2022-03-02 SURGERY — ARTHROPLASTY, KNEE, TOTAL
Anesthesia: Spinal | Site: Knee | Laterality: Right

## 2022-03-02 MED ORDER — PROPOFOL 10 MG/ML IV BOLUS
INTRAVENOUS | Status: DC | PRN
  Administered 2022-03-02: 30 mg via INTRAVENOUS
  Administered 2022-03-02: 20 mg via INTRAVENOUS

## 2022-03-02 MED ORDER — ONDANSETRON HCL 4 MG/2ML IJ SOLN: 4.0000 mg | Freq: Four times a day (QID) | INTRAMUSCULAR | Status: DC | PRN

## 2022-03-02 MED ORDER — LIDOCAINE HCL (PF) 1 % IJ SOLN
INTRAMUSCULAR | Status: AC
  Filled 2022-03-02: qty 2

## 2022-03-02 MED ORDER — METOCLOPRAMIDE HCL 10 MG PO TABS: 5.0000 mg | ORAL_TABLET | Freq: Three times a day (TID) | ORAL | Status: DC | PRN

## 2022-03-02 MED ORDER — ACETAMINOPHEN 10 MG/ML IV SOLN: 1000.0000 mg | Freq: Once | INTRAVENOUS | Status: DC | PRN

## 2022-03-02 MED ORDER — SODIUM CHLORIDE FLUSH 0.9 % IV SOLN
INTRAVENOUS | Status: AC
  Filled 2022-03-02: qty 40

## 2022-03-02 MED ORDER — FAMOTIDINE 20 MG PO TABS: 20.0000 mg | ORAL_TABLET | Freq: Once | ORAL | Status: AC

## 2022-03-02 MED ORDER — MORPHINE SULFATE (PF) 2 MG/ML IV SOLN: 0.5000 mg | INTRAVENOUS | Status: DC | PRN

## 2022-03-02 MED ORDER — TRANEXAMIC ACID-NACL 1000-0.7 MG/100ML-% IV SOLN
1000.0000 mg | INTRAVENOUS | Status: AC
  Administered 2022-03-02: 1000 mg via INTRAVENOUS

## 2022-03-02 MED ORDER — MIDAZOLAM HCL 2 MG/2ML IJ SOLN
INTRAMUSCULAR | Status: AC
  Filled 2022-03-02: qty 2

## 2022-03-02 MED ORDER — BUPIVACAINE HCL (PF) 0.5 % IJ SOLN
INTRAMUSCULAR | Status: AC
  Filled 2022-03-02: qty 20

## 2022-03-02 MED ORDER — BUPIVACAINE-EPINEPHRINE (PF) 0.5% -1:200000 IJ SOLN
INTRAMUSCULAR | Status: AC
  Filled 2022-03-02: qty 30

## 2022-03-02 MED ORDER — PROMETHAZINE HCL 25 MG/ML IJ SOLN: 6.2500 mg | INTRAMUSCULAR | Status: DC | PRN

## 2022-03-02 MED ORDER — ONDANSETRON HCL 4 MG PO TABS: 4.0000 mg | ORAL_TABLET | Freq: Four times a day (QID) | ORAL | Status: DC | PRN

## 2022-03-02 MED ORDER — DROPERIDOL 2.5 MG/ML IJ SOLN: 0.6250 mg | Freq: Once | INTRAMUSCULAR | Status: DC | PRN

## 2022-03-02 MED ORDER — HYDROCODONE-ACETAMINOPHEN 5-325 MG PO TABS: 1.0000 | ORAL_TABLET | ORAL | Status: DC | PRN

## 2022-03-02 MED ORDER — BUPIVACAINE-EPINEPHRINE (PF) 0.25% -1:200000 IJ SOLN
INTRAMUSCULAR | Status: AC
  Filled 2022-03-02: qty 30

## 2022-03-02 MED ORDER — 0.9 % SODIUM CHLORIDE (POUR BTL) OPTIME
TOPICAL | Status: DC | PRN
  Administered 2022-03-02: 1000 mL

## 2022-03-02 MED ORDER — BUPIVACAINE LIPOSOME 1.3 % IJ SUSP
INTRAMUSCULAR | Status: AC
  Filled 2022-03-02: qty 20

## 2022-03-02 MED ORDER — FENTANYL CITRATE (PF) 100 MCG/2ML IJ SOLN: 25.0000 ug | INTRAMUSCULAR | Status: DC | PRN

## 2022-03-02 MED ORDER — LIDOCAINE HCL (PF) 2 % IJ SOLN
INTRAMUSCULAR | Status: AC
  Filled 2022-03-02: qty 5

## 2022-03-02 MED ORDER — KETOROLAC TROMETHAMINE 15 MG/ML IJ SOLN
15.0000 mg | Freq: Four times a day (QID) | INTRAMUSCULAR | Status: DC
  Administered 2022-03-02: 15 mg via INTRAVENOUS

## 2022-03-02 MED ORDER — CHLORHEXIDINE GLUCONATE 0.12 % MT SOLN: 15.0000 mL | Freq: Once | OROMUCOSAL | Status: AC

## 2022-03-02 MED ORDER — ACETAMINOPHEN 325 MG PO TABS
ORAL_TABLET | ORAL | Status: AC
  Filled 2022-03-02: qty 2

## 2022-03-02 MED ORDER — FAMOTIDINE 20 MG PO TABS
ORAL_TABLET | ORAL | Status: AC
  Administered 2022-03-02: 20 mg via ORAL
  Filled 2022-03-02: qty 1

## 2022-03-02 MED ORDER — CEFAZOLIN SODIUM-DEXTROSE 2-4 GM/100ML-% IV SOLN
INTRAVENOUS | Status: AC
  Filled 2022-03-02: qty 100

## 2022-03-02 MED ORDER — PHENYLEPHRINE HCL (PRESSORS) 10 MG/ML IV SOLN
INTRAVENOUS | Status: AC
  Filled 2022-03-02: qty 1

## 2022-03-02 MED ORDER — LIDOCAINE HCL (PF) 1 % IJ SOLN
INTRAMUSCULAR | Status: DC | PRN
  Administered 2022-03-02: 1 mL via SUBCUTANEOUS

## 2022-03-02 MED ORDER — LACTATED RINGERS IV SOLN: INTRAVENOUS | Status: DC

## 2022-03-02 MED ORDER — SURGIRINSE WOUND IRRIGATION SYSTEM - OPTIME
TOPICAL | Status: DC | PRN
  Administered 2022-03-02: 450 mL via TOPICAL

## 2022-03-02 MED ORDER — OXYCODONE HCL 5 MG PO TABS: 5.0000 mg | ORAL_TABLET | Freq: Once | ORAL | Status: DC | PRN

## 2022-03-02 MED ORDER — OXYCODONE HCL 5 MG/5ML PO SOLN: 5.0000 mg | Freq: Once | ORAL | Status: DC | PRN

## 2022-03-02 MED ORDER — ORAL CARE MOUTH RINSE: 15.0000 mL | Freq: Once | OROMUCOSAL | Status: AC

## 2022-03-02 MED ORDER — BUPIVACAINE HCL (PF) 0.5 % IJ SOLN
INTRAMUSCULAR | Status: DC | PRN
  Administered 2022-03-02: 20 mL via PERINEURAL

## 2022-03-02 MED ORDER — OXYCODONE HCL 5 MG PO TABS: 5.0000 mg | ORAL_TABLET | ORAL | 0 refills | Status: DC | PRN

## 2022-03-02 MED ORDER — DOCUSATE SODIUM 100 MG PO CAPS: 100.0000 mg | ORAL_CAPSULE | Freq: Every day | ORAL | 2 refills | Status: DC | PRN

## 2022-03-02 MED ORDER — CEFAZOLIN SODIUM-DEXTROSE 2-4 GM/100ML-% IV SOLN: 2.0000 g | Freq: Four times a day (QID) | INTRAVENOUS | Status: AC

## 2022-03-02 MED ORDER — BUPIVACAINE-EPINEPHRINE (PF) 0.25% -1:200000 IJ SOLN
INTRAMUSCULAR | Status: DC | PRN
  Administered 2022-03-02: 30 mL via PERINEURAL

## 2022-03-02 MED ORDER — CEFAZOLIN SODIUM-DEXTROSE 2-4 GM/100ML-% IV SOLN
INTRAVENOUS | Status: AC
  Administered 2022-03-02: 2 g via INTRAVENOUS
  Filled 2022-03-02: qty 100

## 2022-03-02 MED ORDER — ONDANSETRON HCL 4 MG/2ML IJ SOLN
INTRAMUSCULAR | Status: DC | PRN
  Administered 2022-03-02: 4 mg via INTRAVENOUS

## 2022-03-02 MED ORDER — BUPIVACAINE HCL (PF) 0.5 % IJ SOLN
INTRAMUSCULAR | Status: AC
  Filled 2022-03-02: qty 10

## 2022-03-02 MED ORDER — CHLORHEXIDINE GLUCONATE 0.12 % MT SOLN
OROMUCOSAL | Status: AC
  Administered 2022-03-02: 15 mL via OROMUCOSAL
  Filled 2022-03-02: qty 15

## 2022-03-02 MED ORDER — PROPOFOL 1000 MG/100ML IV EMUL
INTRAVENOUS | Status: AC
  Filled 2022-03-02: qty 100

## 2022-03-02 MED ORDER — ACETAMINOPHEN 325 MG PO TABS
325.0000 mg | ORAL_TABLET | Freq: Four times a day (QID) | ORAL | Status: DC | PRN
  Administered 2022-03-02: 650 mg via ORAL

## 2022-03-02 MED ORDER — POVIDONE-IODINE 10 % EX SWAB
2.0000 | Freq: Once | CUTANEOUS | Status: AC
  Administered 2022-03-02: 2 via TOPICAL

## 2022-03-02 MED ORDER — KETOROLAC TROMETHAMINE 15 MG/ML IJ SOLN
INTRAMUSCULAR | Status: AC
  Filled 2022-03-02: qty 1

## 2022-03-02 MED ORDER — BUPIVACAINE HCL (PF) 0.5 % IJ SOLN
INTRAMUSCULAR | Status: DC | PRN
  Administered 2022-03-02: 3 mL

## 2022-03-02 MED ORDER — METOCLOPRAMIDE HCL 5 MG/ML IJ SOLN: 5.0000 mg | Freq: Three times a day (TID) | INTRAMUSCULAR | Status: DC | PRN

## 2022-03-02 MED ORDER — PROPOFOL 500 MG/50ML IV EMUL
INTRAVENOUS | Status: DC | PRN
  Administered 2022-03-02: 75 ug/kg/min via INTRAVENOUS

## 2022-03-02 MED ORDER — MIDAZOLAM HCL 2 MG/2ML IJ SOLN
1.0000 mg | INTRAMUSCULAR | Status: DC | PRN
  Administered 2022-03-02: 2 mg via INTRAVENOUS

## 2022-03-02 MED ORDER — CEFAZOLIN SODIUM-DEXTROSE 2-4 GM/100ML-% IV SOLN
2.0000 g | INTRAVENOUS | Status: AC
  Administered 2022-03-02: 2 g via INTRAVENOUS

## 2022-03-02 MED ORDER — SODIUM CHLORIDE 0.9 % IR SOLN
Status: DC | PRN
  Administered 2022-03-02: 3000 mL

## 2022-03-02 MED ORDER — SODIUM CHLORIDE 0.9 % IV SOLN
INTRAVENOUS | Status: DC | PRN
  Administered 2022-03-02: 60 mL

## 2022-03-02 MED ORDER — HYDROCODONE-ACETAMINOPHEN 7.5-325 MG PO TABS: 1.0000 | ORAL_TABLET | ORAL | Status: DC | PRN

## 2022-03-02 MED ORDER — TRANEXAMIC ACID-NACL 1000-0.7 MG/100ML-% IV SOLN
INTRAVENOUS | Status: AC
  Filled 2022-03-02: qty 100

## 2022-03-02 MED ORDER — PHENYLEPHRINE HCL-NACL 20-0.9 MG/250ML-% IV SOLN
INTRAVENOUS | Status: DC | PRN
  Administered 2022-03-02: 40 ug/min via INTRAVENOUS

## 2022-03-02 SURGICAL SUPPLY — 60 items
APL PRP STRL LF DISP 70% ISPRP (MISCELLANEOUS) ×2
BASEPLATE TIBIAL SZ 6 (Knees) IMPLANT
BLADE SAGITTAL AGGR TOOTH XLG (BLADE) ×2 IMPLANT
BLADE SAW SAG 25X90X1.19 (BLADE) ×2 IMPLANT
BOWL CEMENT MIX W/ADAPTER (MISCELLANEOUS) ×2 IMPLANT
BRUSH SCRUB EZ  4% CHG (MISCELLANEOUS) ×1
BRUSH SCRUB EZ 4% CHG (MISCELLANEOUS) ×2 IMPLANT
BSPLAT TIB 6 CMNT M TPR KN RT (Knees) ×1 IMPLANT
CEMENT GMV SMARTSET GENT 40G (Cement) IMPLANT
CHLORAPREP W/TINT 26 (MISCELLANEOUS) ×4 IMPLANT
COMP PATELLA GENESIS 38 OVAL (Stem) ×1 IMPLANT
COMPONENT FEM OXINIUM SZ 6 RT (Knees) IMPLANT
COMPONENT PTLLA GENS 38 OVAL (Stem) IMPLANT
COOLER POLAR GLACIER W/PUMP (MISCELLANEOUS) ×2 IMPLANT
CUFF TOURN SGL QUICK 34 (TOURNIQUET CUFF)
CUFF TRNQT CYL 34X4.125X (TOURNIQUET CUFF) ×2 IMPLANT
DRAPE 3/4 80X56 (DRAPES) ×4 IMPLANT
DRAPE INCISE IOBAN 66X60 STRL (DRAPES) ×2 IMPLANT
ELECT REM PT RETURN 9FT ADLT (ELECTROSURGICAL) ×1
ELECTRODE REM PT RTRN 9FT ADLT (ELECTROSURGICAL) ×2 IMPLANT
GAUZE PAD ABD 8X10 STRL (GAUZE/BANDAGES/DRESSINGS) ×4 IMPLANT
GAUZE SPONGE 4X4 12PLY STRL (GAUZE/BANDAGES/DRESSINGS) ×2 IMPLANT
GAUZE XEROFORM 1X8 LF (GAUZE/BANDAGES/DRESSINGS) ×2 IMPLANT
GLOVE BIO SURGEON STRL SZ8 (GLOVE) ×4 IMPLANT
GLOVE BIOGEL PI IND STRL 8.5 (GLOVE) ×4 IMPLANT
GLOVE PI ORTHO PRO STRL SZ8 (GLOVE) ×4 IMPLANT
GLOVE SURG ORTHO 8.5 STRL (GLOVE) ×2 IMPLANT
GOWN STRL REUS W/ TWL XL LVL3 (GOWN DISPOSABLE) ×4 IMPLANT
GOWN STRL REUS W/TWL XL LVL3 (GOWN DISPOSABLE) ×2
HOOD PEEL AWAY FLYTE STAYCOOL (MISCELLANEOUS) ×6 IMPLANT
INSERT TIB XLPE 13 SZ5-6 (Insert) IMPLANT
IRRIGATION SURGIPHOR STRL (IV SOLUTION) ×2 IMPLANT
IV NS IRRIG 3000ML ARTHROMATIC (IV SOLUTION) ×2 IMPLANT
KIT TURNOVER KIT A (KITS) ×2 IMPLANT
MANIFOLD NEPTUNE II (INSTRUMENTS) ×2 IMPLANT
MAT ABSORB  FLUID 56X50 GRAY (MISCELLANEOUS) ×1
MAT ABSORB FLUID 56X50 GRAY (MISCELLANEOUS) ×2 IMPLANT
NDL SAFETY ECLIP 18X1.5 (MISCELLANEOUS) ×2 IMPLANT
NDL SPNL 20GX3.5 QUINCKE YW (NEEDLE) ×2 IMPLANT
NEEDLE SPNL 20GX3.5 QUINCKE YW (NEEDLE) ×1 IMPLANT
NS IRRIG 1000ML POUR BTL (IV SOLUTION) ×2 IMPLANT
PACK TOTAL KNEE (MISCELLANEOUS) ×2 IMPLANT
PAD DE MAYO PRESSURE PROTECT (MISCELLANEOUS) ×2 IMPLANT
PAD WRAPON POLAR KNEE (MISCELLANEOUS) ×2 IMPLANT
PULSAVAC PLUS IRRIG FAN TIP (DISPOSABLE) ×1
STAPLER SKIN PROX 35W (STAPLE) ×2 IMPLANT
SUCTION FRAZIER HANDLE 10FR (MISCELLANEOUS) ×1
SUCTION TUBE FRAZIER 10FR DISP (MISCELLANEOUS) ×2 IMPLANT
SUT DVC 2 QUILL PDO  T11 36X36 (SUTURE) ×1
SUT DVC 2 QUILL PDO T11 36X36 (SUTURE) ×2 IMPLANT
SUT VIC AB 2-0 CT1 18 (SUTURE) ×2 IMPLANT
SUT VIC AB 2-0 CT1 27 (SUTURE)
SUT VIC AB 2-0 CT1 TAPERPNT 27 (SUTURE) IMPLANT
SUT VIC AB PLUS 45CM 1-MO-4 (SUTURE) ×2 IMPLANT
SYR 30ML LL (SYRINGE) ×6 IMPLANT
TIBIAL BASEPLATE SZ 6 (Knees) ×1 IMPLANT
TIP FAN IRRIG PULSAVAC PLUS (DISPOSABLE) ×2 IMPLANT
TRAP FLUID SMOKE EVACUATOR (MISCELLANEOUS) ×2 IMPLANT
WATER STERILE IRR 500ML POUR (IV SOLUTION) ×2 IMPLANT
WRAPON POLAR PAD KNEE (MISCELLANEOUS) ×1

## 2022-03-02 NOTE — Transfer of Care (Signed)
Immediate Anesthesia Transfer of Care Note  Patient: ESTES LEHNER  Procedure(s) Performed: TOTAL KNEE ARTHROPLASTY (Right: Knee)  Patient Location: PACU  Anesthesia Type:Spinal  Level of Consciousness: awake, alert  and oriented  Airway & Oxygen Therapy: Patient Spontanous Breathing and Patient connected to face mask oxygen  Post-op Assessment: Report given to RN and Post -op Vital signs reviewed and stable  Post vital signs: Reviewed and stable  Last Vitals:  Vitals Value Taken Time  BP    Temp    Pulse 81 03/02/22 0940  Resp 27 03/02/22 0940  SpO2 98 % 03/02/22 0940  Vitals shown include unvalidated device data.  Last Pain:  Vitals:   03/02/22 0617  TempSrc: Temporal  PainSc: 6          Complications: No notable events documented.

## 2022-03-02 NOTE — OR Nursing (Signed)
Bladder scan 388cc.  Can bend knees and move slightly more.  Has sensation to feet.

## 2022-03-02 NOTE — Discharge Instructions (Addendum)
Continue weight bear as tolerated on the right lower extremity.    Elevate the right lower extremity whenever possible and continue the polar care while elevating the extremity. Patient may shower. No bath or submerging the wound.    Take Xarelto as directed for blood clot prevention.  Continue to work on knee range of motion exercises at home as instructed by physical therapy. Continue to use a walker for assistance with ambulation until cleared by physical therapy.  Call [phone] with any questions, such as fever > 101.5 degrees, drainage from the wound or shortness of breath.   AMBULATORY SURGERY  DISCHARGE INSTRUCTIONS   The drugs that you were given will stay in your system until tomorrow so for the next 24 hours you should not:  Drive an automobile Make any legal decisions Drink any alcoholic beverage   You may resume regular meals tomorrow.  Today it is better to start with liquids and gradually work up to solid foods.  You may eat anything you prefer, but it is better to start with liquids, then soup and crackers, and gradually work up to solid foods.   Please notify your doctor immediately if you have any unusual bleeding, trouble breathing, redness and pain at the surgery site, drainage, fever, or pain not relieved by medication.    Additional Instructions:    PLEASE LEAVE GREEN ARMBAND ON FOR 4 DAYS   Please contact your physician with any problems or Same Day Surgery at [phone], Monday through Friday 6 am to 4 pm, or [place] at [facility] number at [phone].  POLAR CARE INFORMATION  [email or web address]  How to use [facility] Cold Therapy System?  YouTube   [email or web address]  OPERATING INSTRUCTIONS  Start the product With dry hands, connect the transformer to the electrical connection located on the top of the cooler. Next, plug the transformer into an appropriate electrical outlet. The unit will  automatically start running at this point.  To stop the pump, disconnect electrical power.  Unplug to stop the product when not in use. Unplugging the Polar Care unit turns it off. Always unplug immediately after use. Never leave it plugged in while unattended. Remove pad.    FIRST ADD WATER TO FILL LINE, THEN ICE---Replace ice when existing ice is almost melted  1 Discuss Treatment with your [facility] Practitioner and Use Only as Prescribed 2 Apply Insulation Barrier & Cold Therapy Pad 3 Check for Moisture 4 Inspect Skin Regularly  Tips and Trouble Shooting Usage Tips 1. Use cubed or chunked ice for optimal performance. 2. It is recommended to drain the Pad between uses. To drain the pad, hold the Pad upright with the hose pointed toward the ground. Depress the black plunger and allow water to drain out. 3. You may disconnect the Pad from the unit without removing the pad from the affected area by depressing the silver tabs on the hose coupling and gently pulling the hoses apart. The Pad and unit will seal itself and will not leak. Note: Some dripping during release is normal. 4. DO NOT RUN PUMP WITHOUT WATER! The pump in this unit is designed to run with water. Running the unit without water will cause permanent damage to the pump. 5. Unplug unit before removing lid.  TROUBLESHOOTING GUIDE Pump not running, Water not flowing to the pad, Pad is not getting cold 1. Make sure the transformer is plugged into the wall outlet. 2. Confirm that the ice and water are filled to  the indicated levels. 3. Make sure there are no kinks in the pad. 4. Gently pull on the blue tube to make sure the tube/pad junction is straight. 5. Remove the pad from the treatment site and ll it while the pad is lying at; then reapply. 6. Confirm that the pad couplings are securely attached to the unit. Listen for the double clicks (Figure 1) to confirm the pad couplings are securely attached.  Leaks    Note:  Some condensation on the lines, controller, and pads is unavoidable, especially in warmer climates. 1. If using a [place] Polar Care Cold Therapy unit with a detachable Cold Therapy Pad, and a leak exists (other than condensation on the lines) disconnect the pad couplings. Make sure the silver tabs on the couplings are depressed before reconnecting the pad to the pump hose; then confirm both sides of the coupling are properly clicked in. 2. If the coupling continues to leak or a leak is detected in the pad itself, stop using it and call [facility] at [phone].  Cleaning After use, empty and dry the unit with a soft cloth. Warm water and mild detergent may be used occasionally to clean the pump and tubes.  WARNING: The [place] can be cold enough to cause serious injury, including full skin necrosis. Follow these Operating Instructions, and carefully read the Product Insert (see pouch on side of unit) and the Cold Therapy Pad Fitting Instructions (provided with each Cold Therapy Pad) prior to use.

## 2022-03-02 NOTE — H&P (Signed)
The patient has been re-examined, and the chart reviewed, and there have been no interval changes to the documented history and physical.  Plan a right total knee today.  Anesthesia is consulted regarding a peripheral nerve block for post-operative pain.  The risks, benefits, and alternatives have been discussed at length, and the patient is willing to proceed.     

## 2022-03-02 NOTE — Anesthesia Procedure Notes (Addendum)
Spinal  Patient location during procedure: OR Start time: 03/02/2022 7:44 AM End time: 03/02/2022 7:49 AM Reason for block: surgical anesthesia Staffing Performed: resident/CRNA  Anesthesiologist: Iran Ouch, MD Resident/CRNA: Nelda Marseille, CRNA Performed by: Nelda Marseille, CRNA Authorized by: Iran Ouch, MD   Preanesthetic Checklist Completed: patient identified, IV checked, site marked, risks and benefits discussed, surgical consent, monitors and equipment checked, pre-op evaluation and timeout performed Spinal Block Patient position: sitting Prep: ChloraPrep Patient monitoring: heart rate, continuous pulse ox, blood pressure and cardiac monitor Approach: midline Location: L3-4 Injection technique: single-shot Needle Needle type: Whitacre and Introducer  Needle gauge: 25 G Needle length: 9 cm Assessment Events: CSF return Additional Notes Negative paresthesia. Negative blood return. Positive free-flowing CSF. Expiration date of kit checked and confirmed. Patient tolerated procedure well, without complications.

## 2022-03-02 NOTE — Anesthesia Preprocedure Evaluation (Addendum)
Anesthesia Evaluation  Patient identified by MRN, date of birth, ID band Patient awake    Reviewed: Allergy & Precautions, NPO status , Patient's Chart, lab work & pertinent test results, reviewed documented beta blocker date and time   History of Anesthesia Complications Negative for: history of anesthetic complications  Airway Mallampati: III  TM Distance: >3 FB Neck ROM: Full    Dental  (+) Poor Dentition   Pulmonary neg COPD, Current SmokerPatient did not abstain from smoking.,    breath sounds clear to auscultation- rhonchi (-) wheezing      Cardiovascular Exercise Tolerance: Good hypertension, Pt. on medications and Pt. on home beta blockers + Peripheral Vascular Disease (sp bilateral common femoral artery endarterectomy in October.)  (-) CAD, (-) Past MI, (-) Cardiac Stents and (-) CABG + dysrhythmias (afib in setting of acute illness, resolved spontaneously)  Rhythm:Regular Rate:Normal - Systolic murmurs and - Diastolic murmurs DD: 12/8500 Echo: EF 50-55%, no rwma, Gr1 DD, nl RV size/fxn, triv MR.   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 08/2020 s/p ex lap & R colectomy. No date: Chronic anticoagulation     Comment:  Rivaroxaban No date: Diastolic dysfunction     Comment:  a.) 08/2020 Echo: EF 50-55%, no rwma, Gr1 DD, nl RV               size/fxn, triv MR. No date: Essential hypertension No date: ETOH abuse No date: Hypo-osmolality and hyponatremia No date: Persistent  atrial fibrillation ([place])     Comment:  a.) Dx 08/2020 in setting of cecal volvulus s/p R               colectomy.  b.) CHA2DS'@VASc'$  =  2. No date: Tobacco abuse No date: Varicose veins of both lower extremities   Reproductive/Obstetrics                            Anesthesia Physical  Anesthesia Plan  ASA: 3  Anesthesia Plan: General/Spinal   Post-op Pain Management: Regional block* and Ofirmev IV (intra-op)*   Induction: Intravenous  PONV Risk Score and Plan: 0 and Ondansetron and Dexamethasone  Airway Management Planned: Natural Airway  Additional Equipment:   Intra-op Plan:   Post-operative Plan:   Informed Consent: I have reviewed the patients History and Physical, chart, labs and discussed the procedure including the risks, benefits and alternatives for the proposed anesthesia with the patient or authorized representative who has indicated his/her understanding and acceptance.     Dental advisory given  Plan Discussed with: CRNA and Anesthesiologist  Anesthesia Plan Comments:        Anesthesia Quick Evaluation

## 2022-03-02 NOTE — Evaluation (Addendum)
Physical Therapy Evaluation Patient Details Name: Henry Hill MRN: [id number] DOB: 1960/11/07 Today's Date: 03/02/2022  History of Present Illness  Pt admitted for R TKR and is POD 0 at time of evaluation. History includes Afib with RVR, AKI, HTN and L TKR in 2019.  Clinical Impression  Pt is a pleasant 61 year old male who was admitted for R TKR. Pt performs bed mobility, transfers, and ambulation with cga and RW. Pt demonstrates ability to perform 10 SLRs with independence, therefore does not require KI for mobility. All goals met for discharge this date. Pt has equipment at home and support person at bedside. Mild pain and pt agreeable to session. Recommend follow up with OP PT for further needs. Will dc.      Recommendations for follow up therapy are one component of a multi-disciplinary discharge planning process, led by the attending physician.  Recommendations may be updated based on patient status, additional functional criteria and insurance authorization.  Follow Up Recommendations Outpatient PT      Assistance Recommended at Discharge PRN  Patient can return home with the following  A little help with walking and/or transfers    Equipment Recommendations None recommended by PT  Recommendations for Other Services       Functional Status Assessment Patient has had a recent decline in their functional status and demonstrates the ability to make significant improvements in function in a reasonable and predictable amount of time.     Precautions / Restrictions Precautions Precautions: Fall;Knee Precaution Booklet Issued: Yes (comment) Restrictions Weight Bearing Restrictions: Yes RLE Weight Bearing: Weight bearing as tolerated      Mobility  Bed Mobility Overal bed mobility: Needs Assistance Bed Mobility: Supine to Sit     Supine to sit: Min guard     General bed mobility comments: safe technique    Transfers Overall transfer level: Needs  assistance Equipment used: Rolling walker (2 wheels) Transfers: Sit to/from Stand Sit to Stand: Min guard           General transfer comment: safe technique with upright posture. RW used    Ambulation/Gait Ambulation/Gait assistance: Counsellor (Feet): 60 Feet Assistive device: Rolling walker (2 wheels) Gait Pattern/deviations: Step-to pattern       General Gait Details: ambulated with step to gait pattern with safe technique. RW used  Financial trader Rankin (Stroke Patients Only)       Balance Overall balance assessment: Needs assistance Sitting-balance support: Feet supported Sitting balance-Leahy Scale: Good     Standing balance support: Bilateral upper extremity supported Standing balance-Leahy Scale: Good                               Pertinent Vitals/Pain Pain Assessment Pain Assessment: 0-10 Pain Score: 5  Pain Location: R knee Pain Descriptors / Indicators: Operative site guarding Pain Intervention(s): Limited activity within patient's tolerance, Premedicated before session, Ice applied, Monitored during session    Home Living Family/patient expects to be discharged to:: Private residence Living Arrangements: Spouse/significant other Available Help at Discharge: Family;Available 24 hours/day Type of Home: House Home Access: Ramped entrance       Home Layout: One level Home Equipment: Conservation officer, nature (2 wheels);Cane - single point Additional Comments: 3in1 to be delivered to pt home, confirmed via EMR    Prior Function Prior Level of Function :  Independent/Modified Independent;Driving             Mobility Comments: indep with all mobility ADLs Comments: indep     Hand Dominance        Extremity/Trunk Assessment   Upper Extremity Assessment Upper Extremity Assessment: Overall WFL for tasks assessed    Lower Extremity Assessment Lower Extremity Assessment:  Generalized weakness (R LE grossly 3/5; L LE grossly 4/5)       Communication   Communication: No difficulties  Cognition Arousal/Alertness: Awake/alert Behavior During Therapy: WFL for tasks assessed/performed Overall Cognitive Status: Within Functional Limits for tasks assessed                                          General Comments      Exercises Total Joint Exercises Goniometric ROM: R knee 12-75 degrees Other Exercises Other Exercises: supine ther-ex performed and HEP given and reviewed. Frequency and duration explained and all questions addressed. Performed 5 reps of each exercise Other Exercises: education given and reviewed on OT packet including adaptive equipment, polar care system, and therapy expectaions   Assessment/Plan    PT Assessment All further PT needs can be met in the next venue of care  PT Problem List Decreased strength;Decreased range of motion;Decreased mobility;Pain       PT Treatment Interventions      PT Goals (Current goals can be found in the Care Plan section)  Acute Rehab PT Goals Patient Stated Goal: to go home PT Goal Formulation: All assessment and education complete, DC therapy Time For Goal Achievement: 03/02/22 Potential to Achieve Goals: Good    Frequency       Co-evaluation               AM-PAC PT "6 Clicks" Mobility  Outcome Measure Help needed turning from your back to your side while in a flat bed without using bedrails?: None Help needed moving from lying on your back to sitting on the side of a flat bed without using bedrails?: None Help needed moving to and from a bed to a chair (including a wheelchair)?: A Little Help needed standing up from a chair using your arms (e.g., wheelchair or bedside chair)?: A Little Help needed to walk in hospital room?: A Little Help needed climbing 3-5 steps with a railing? : A Little 6 Click Score: 20    End of Session Equipment Utilized During Treatment: Gait  belt Activity Tolerance: Patient tolerated treatment well Patient left: in chair Nurse Communication: Mobility status PT Visit Diagnosis: Muscle weakness (generalized) (M62.81);Difficulty in walking, not elsewhere classified (R26.2);Pain Pain - Right/Left: Right Pain - part of body: Knee    Time: 5974-1638 PT Time Calculation (min) (ACUTE ONLY): 29 min   Charges:   PT Evaluation $PT Eval Low Complexity: 1 Low PT Treatments $Therapeutic Exercise: 8-22 mins        Henry Hill, PT, DPT, GCS [phone]   Henry Hill 03/02/2022, 5:03 PM

## 2022-03-02 NOTE — Anesthesia Postprocedure Evaluation (Signed)
Anesthesia Post Note  Patient: Henry Hill  Procedure(s) Performed: TOTAL KNEE ARTHROPLASTY (Right: Knee)  Patient location during evaluation: PACU Anesthesia Type: Combined General/Spinal Level of consciousness: awake and alert Pain management: pain level controlled Vital Signs Assessment: post-procedure vital signs reviewed and stable Respiratory status: spontaneous breathing, nonlabored ventilation and respiratory function stable Cardiovascular status: blood pressure returned to baseline and stable Postop Assessment: no apparent nausea or vomiting Anesthetic complications: no   No notable events documented.   Last Vitals:  Vitals:   03/02/22 1130 03/02/22 1145  BP: (!) 160/85 (!) 165/89  Pulse: 61 68  Resp: 18 20  Temp:  (!) 36.3 C  SpO2: 100% 100%    Last Pain:  Vitals:   03/02/22 1145  TempSrc:   PainSc: 0-No pain                 Iran Ouch

## 2022-03-02 NOTE — Op Note (Signed)
DATE OF SURGERY:  03/02/2022 TIME: 9:38 AM  PATIENT NAME:  Henry Hill   AGE: 61 y.o.    PRE-OPERATIVE DIAGNOSIS:  M17.11 Unilateral primary osteoarthritis, right knee  POST-OPERATIVE DIAGNOSIS:  Same  PROCEDURE:  Procedure(s): TOTAL KNEE ARTHROPLASTY, RIGHT  SURGEON:  Lovell Sheehan, MD   ASSISTANT:  [name],  PA-C  OPERATIVE IMPLANTS: Tamala Julian [employer] Nephew, Cruciate Retaining Oxinium Femoral component size 6, Fixed Bearing Tray size 6, Patella polyethylene 3-peg oval button size 38 mm, with a 13 mm DISHED insert.   PREOPERATIVE INDICATIONS:  Henry Hill is an 61 y.o. male who has a diagnosis of M17.11 Unilateral primary osteoarthritis, right knee and elected for a total knee arthroplasty after failing nonoperative treatment, including activity modification, pain medication, physical therapy and injections who has significant impairment of their activities of daily living.  Radiographs have demonstrated tricompartmental osteoarthritis joint space narrowing, osteophytes, subchondral sclerosis and cyst formation.  The risks, benefits, and alternatives were discussed at length including but not limited to the risks of infection, bleeding, nerve or blood vessel injury, knee stiffness, fracture, dislocation, loosening or failure of the hardware and the need for further surgery. Medical risks include but not limited to DVT and pulmonary embolism, myocardial infarction, stroke, pneumonia, respiratory failure and death. I discussed these risks with the patient in my office prior to the date of surgery. They understood these risks and were willing to proceed.  OPERATIVE FINDINGS AND UNIQUE ASPECTS OF THE CASE:  All three compartments with advanced and severe degenerative changes, large osteophytes and an abundance of synovial fluid. Significant deformity was also noted. A decision was made to proceed with total knee arthroplasty.   OPERATIVE DESCRIPTION:  The patient was brought to the  operative room and placed in a supine position after undergoing placement of a general anesthetic. IV antibiotics were given. Patient received tranexamic acid. The lower extremity was prepped and draped in the usual sterile fashion.  A time out was performed to verify the patient's name, date of birth, medical record number, correct site of surgery and correct procedure to be performed. The timeout was also used to confirm the patient received antibiotics and that appropriate instruments, implants and radiographs studies were available in the room.  The leg was elevated and exsanguinated with an Esmarch and the tourniquet was inflated to 250 mmHg.  A midline incision was made over the left knee.. A medial parapatellar arthrotomy was then made and the patella subluxed laterally and the knee was brought into 90 of flexion. Hoffa's fat pad along with the anterior cruciate ligament was resected and the medial joint line was exposed.  Attention was then turned to preparation of the patella. The thickness of the patella was measured with a caliper, the diameter measured with the patella templates.  The patella resection was then made with an oscillating saw using the patella cutting guide.  The 38 mm button fit appropriately.  3 peg holes for the patella component were then drilled.  The extramedullary tibial cutting guide was then placed using the anterior tibial crest and second ray of the foot as a reference.  The tibial cutting guide was adjusted to allow for appropriate posterior slope.  The tibial cutting block was pinned into position. The slotted stylus was used to measure the proximal tibial resection of 9 mm off the high lateral side. Care was taken during the tibial resection to protect the medial and collateral ligaments.  The resected tibial bone was removed.  The distal femur was resected using the intramedullary cutting guide.  Care was taken to protect the collateral ligaments during distal  femoral resection.  The distal femoral resection was performed with an oscillating saw. The femoral cutting guide was then removed. Extension gap was measured with a 13 mm spacer block and alignment and extension was confirmed using a long alignment rod. The femur was sized to be a 6. Rotation of the referencing guide was checked with the epicondylar axis and Whitesides line. Then the 4-in-1 cutting jig was then applied to the distal femur. A stylus was used to confirm that the anterior femur would not be notched.   Then the anterior, posterior and chamfer femoral cuts were then made with an oscillating saw.  The knee was distracted and all posterior osteophytes were removed.  The flexion gap was then measured with a flexion spacer block and long alignment rod and was found to be symmetric with the extension gap and perpendicular to mechanical axis of the tibia.  The proximal tibia plateau was then sized with trial trays. The best coverage was achieved with a size 6. This tibial tray was then pinned into position. The proximal tibia was then prepared with the keel punch.  After tibial preparation was completed, all trial components were inserted with polyethylene trials. The knee achieved full extension and flexed to 120 degrees. Ligament were stable to varus and valgus at full extension as well as 30, 60 and 90 degrees of flexion.   The trials were then placed. Knee was taken through a full range of motion and deemed to be stable with the trial components. All trial components were then removed.  The joint was copiously irrigated with pulse lavage.  The final total knee arthroplasty components were then cemented into place. The knee was held in extension while cement was allowed to cure.The knee was taken through a range of motion and the patella tracked well and the knee was again irrigated copiously.  The knee capsule was then injected with Exparel.  The medial arthrotomy was closed with #1 Vicryl and #2  Quill. The subcutaneous tissue closed with  2-0 vicryl, and skin approximated with staples.  A dry sterile and compressive dressing was applied.  A Polar Care was applied to the operative knee.  The patient was awakened and brought to the PACU in stable and satisfactory condition.  All sharp, lap and instrument counts were correct at the conclusion the case. I spoke with the patient's family in the postop consultation room to let them know the case had been performed without complication and the patient was stable in recovery room.   Total tourniquet time was 54 minutes.

## 2022-03-02 NOTE — Anesthesia Procedure Notes (Signed)
Procedure Name: MAC Date/Time: 03/02/2022 7:50 AM  Performed by: Nelda Marseille, CRNAPre-anesthesia Checklist: Patient identified, Emergency Drugs available, Suction available and Patient being monitored Oxygen Delivery Method: Simple face mask

## 2022-03-02 NOTE — Anesthesia Procedure Notes (Signed)
Anesthesia Regional Block: Adductor canal block   Pre-Anesthetic Checklist: , timeout performed,  Correct Patient, Correct Site, Correct Laterality,  Correct Procedure, Correct Position, site marked,  Risks and benefits discussed,  Surgical consent,  Pre-op evaluation,  At surgeon's request and post-op pain management  Laterality: Right  Prep: chloraprep       Needles:  Injection technique: Single-shot  Needle Type: Stimiplex     Needle Length: 9cm  Needle Gauge: 22     Additional Needles:   Procedures:,,,, ultrasound used (permanent image in chart),,    Narrative:  Start time: 03/02/2022 7:25 AM End time: 03/02/2022 7:28 AM Injection made incrementally with aspirations every 5 mL.  Performed by: Personally  Anesthesiologist: Iran Ouch, MD  Additional Notes: Patient consented for risk and benefits of nerve block including but not limited to nerve damage, failed block, bleeding and infection.  Patient voiced understanding.  Functioning IV was confirmed and monitors were applied.  Timeout done prior to procedure and prior to any sedation being given to the patient.  Patient confirmed procedure site prior to any sedation given to the patient. Sterile prep,hand hygiene and sterile gloves were used.  Minimal sedation used for procedure.  No paresthesia endorsed by patient during the procedure.  Negative aspiration and negative test dose prior to incremental administration of local anesthetic. The patient tolerated the procedure well with no immediate complications.

## 2022-03-03 ENCOUNTER — Encounter: Payer: Self-pay | Admitting: Orthopedic Surgery

## 2022-04-29 ENCOUNTER — Ambulatory Visit: Payer: 59 | Attending: Cardiovascular Disease | Admitting: Cardiovascular Disease

## 2022-04-29 ENCOUNTER — Encounter: Payer: Self-pay | Admitting: Cardiovascular Disease

## 2022-04-29 VITALS — BP 170/104 | HR 64 | Ht 72.0 in | Wt 197.0 lb

## 2022-04-29 DIAGNOSIS — I10 Essential (primary) hypertension: Secondary | ICD-10-CM | POA: Diagnosis not present

## 2022-04-29 DIAGNOSIS — E785 Hyperlipidemia, unspecified: Secondary | ICD-10-CM | POA: Diagnosis not present

## 2022-04-29 DIAGNOSIS — I739 Peripheral vascular disease, unspecified: Secondary | ICD-10-CM

## 2022-04-29 DIAGNOSIS — I480 Paroxysmal atrial fibrillation: Secondary | ICD-10-CM | POA: Diagnosis not present

## 2022-04-29 DIAGNOSIS — Z720 Tobacco use: Secondary | ICD-10-CM

## 2022-04-29 MED ORDER — CARVEDILOL 12.5 MG PO TABS: 12.5000 mg | ORAL_TABLET | Freq: Two times a day (BID) | ORAL | 3 refills | Status: DC

## 2022-04-29 MED ORDER — ASPIRIN 81 MG PO TBEC: 81.0000 mg | DELAYED_RELEASE_TABLET | Freq: Every day | ORAL | 3 refills | Status: AC

## 2022-04-29 NOTE — Progress Notes (Signed)
Cardiology Office Note   Date:  04/29/2022   ID:  Henry Hill, DOB 1961-04-06, MRN [id number]  PCP:  [name], MD  Cardiologist:  Dr. Rockey Situ  Chief Complaint  Patient presents with   Follow-up    6 month F/U-No new cardiac concerns      History of Present Illness: Henry Hill is a 61 y.o. male who is here today for follow-up visit regarding peripheral arterial disease.    He has known history of paroxysmal atrial fibrillation, essential hypertension, tobacco use, alcohol use, probable sleep apnea and varicose veins.  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  He had atrial fibrillation with RVR in that setting.  He converted sinus rhythm with metoprolol and diltiazem.  Echocardiogram showed an EF of 50 to 55%.  He was anticoagulated with Eliquis.  He had a right colectomy done with improvement.  He had subsequent Lexiscan Myoview in June, 2022 which showed no clear evidence of ischemia although the stress test was suboptimal.    He was seen in 2022 for severe bilateral leg claudication and large ulceration on the medial side of the left ankle. Angiography was performed in August 2022 which showed no significant aortoiliac disease.  On the right side, there was severe heavily calcified subtotal occlusion of the common femoral artery extending into the ostium of both SFA and profunda with occluded distal SFA and three-vessel runoff below the knee.  On the left side, there was heavily calcified left common femoral artery stenosis extending into the ostium of the SFA with short occlusion of the proximal to mid SFA, occluded popliteal artery and three-vessel runoff below the knee.   He underwent bilateral common femoral artery endarterectomy in October, 2022.  He had some issues with swelling as well as wound dehiscence that subsequently resolved.    He has been doing well overall with no recent chest pain, shortness of breath or  palpitations.  He has minimal bilateral calf claudication.  He continues to smoke but cut down to half a pack per day.   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 08/2020 s/p ex lap & R colectomy.   Diastolic dysfunction    a.) 08/2020 Echo: EF 50-55%, no rwma, Gr1 DD, nl RV size/fxn, triv MR.   Essential hypertension    ETOH abuse    HLD (hyperlipidemia)    Hypo-osmolality and hyponatremia    Long term current use of anticoagulant    a.) rivaroxaban   OA (osteoarthritis)    Persistent atrial fibrillation ([place])    a.) Dx 08/2020 in setting of cecal volvulus s/p R colectomy. b.) CHA2DS2VASc = 2 (HTN, vascular disease history);  c.) rate/rhythm maintained on oral metoprolol tartrate; chronically anticoagulated with rivaroxaban   PVD (peripheral vascular disease) ([facility])    Tobacco abuse    Varicose veins of both lower extremities     Past Surgical History:  Procedure Laterality Date   ABDOMINAL AORTOGRAM W/LOWER EXTREMITY N/A 12/10/2020   Procedure: ABDOMINAL AORTOGRAM W/LOWER EXTREMITY;  Surgeon: [name], MD;  Location: [facility];  Service: Cardiovascular;  Laterality: N/A;   BACK SURGERY  05/10/2001   COLONOSCOPY WITH PROPOFOL N/A 11/24/2020   Procedure: COLONOSCOPY WITH PROPOFOL;  Surgeon: [name], MD;  Location: [facility];  Service: Gastroenterology;  Laterality: N/A;   ENDARTERECTOMY FEMORAL Bilateral 01/14/2021   Procedure: ENDARTERECTOMY  FEMORAL;  Surgeon: [name], MD;  Location: [facility];  Service: Vascular;  Laterality: Bilateral;  Bilateral femoral endarterectomies, COMMON, SFA, AND PROFUNDA  ARTERIES    HEMORRHOID SURGERY N/A 11/13/2014   Procedure: LEFT LATERAL HEMORRHOIDECTOMY  ;  Surgeon: [name], MD;  Location: [facility];  Service: General;  Laterality: N/A;   JOINT REPLACEMENT Left 2018   LAPAROTOMY Right 08/30/2020   Procedure: EXPLORATORY LAPAROTOMY WITH RIGHT COLOCTOMY;   Surgeon: [name], MD;  Location: [facility];  Service: General;  Laterality: Right;   TOTAL KNEE ARTHROPLASTY Right 03/02/2022   Procedure: TOTAL KNEE ARTHROPLASTY;  Surgeon: Lovell Sheehan, MD;  Location: [facility]S;  Service: Orthopedics;  Laterality: Right;     Current Outpatient Medications  Medication Sig Dispense Refill   atorvastatin (LIPITOR) 10 MG tablet TAKE 1 TABLET BY MOUTH EVERY DAY 90 tablet 1   docusate sodium (COLACE) 100 MG capsule Take 1 capsule (100 mg total) by mouth daily as needed. 30 capsule 2   Multiple Vitamins-Minerals (MULTIVITAMIN ADULT PO) Take 1 tablet by mouth daily.     No current facility-administered medications for this visit.    Allergies:   Patient has no known allergies.    Social History:  The patient  reports that he has been smoking cigarettes. He has been smoking an average of .5 packs per day. He has never used smokeless tobacco. He reports current alcohol use of about 35.0 - 42.0 standard drinks of alcohol per week. He reports that he does not use drugs.   Family History:  The patient's family history includes Diabetes in his father; Heart attack in his mother; Hypertension in his father.    ROS:  Please see the history of present illness.   Otherwise, review of systems are positive for none.   All other systems are reviewed and negative.    PHYSICAL EXAM: VS:  BP (!) 170/104 (BP Location: Left Arm, Patient Position: Sitting, Cuff Size: Normal)   Pulse 64   Ht 6' (1.829 m)   Wt 197 lb (89.4 kg)   SpO2 98%   BMI 26.72 kg/m  , BMI Body mass index is 26.72 kg/m. GEN: Well nourished, well developed, in no acute distress  HEENT: normal  Neck: no JVD, carotid bruits, or masses Cardiac: RRR; no murmurs, rubs, or gallops,no edema  Respiratory:  clear to auscultation bilaterally, normal work of breathing GI: soft, nontender, nondistended, + BS MS: no deformity or atrophy  Skin: warm and dry, no rash Neuro:  Strength and sensation are  intact Psych: euthymic mood, full affect   EKG:  EKG is ordered today. EKG showed normal sinus rhythm with minimal LVH.   Recent Labs: 02/18/2022: ALT 17; BUN 12; Creatinine, Ser 0.61; Hemoglobin 14.0; Platelets 284; Potassium 4.3; Sodium 129    Lipid Panel    Component Value Date/Time   CHOL 151 07/03/2019 0816   TRIG 48 09/01/2020 0619   HDL 100 07/03/2019 0816   CHOLHDL 1.5 07/03/2019 0816   LDLCALC 42 07/03/2019 0816      Wt Readings from Last 3 Encounters:  04/29/22 197 lb (89.4 kg)  03/02/22 205 lb (93 kg)  02/18/22 205 lb (93 kg)          11/13/2020    3:13 PM  PAD Screen  Previous PAD dx? No  Previous surgical procedure? Yes  Dates of procedures knee replacement  Pain with walking? Yes  Subsides with rest? No  Feet/toe relief with dangling? No  Painful, non-healing ulcers? Yes  Extremities discolored? Yes      ASSESSMENT AND PLAN:  1.  Peripheral arterial disease : Status post  bilateral common femoral artery endarterectomy with resolution of claudication.  He has known occlusive disease affecting the SFA and popliteal arteries but his claudication is currently not lifestyle limiting.  Recommend continuing medical therapy.  I requested a follow-up ABI and duplex to be done in January.  2.  Paroxysmal atrial fibrillation: He is maintaining in sinus rhythm.  CHA2DS2-VASc score is 2.  The patient asked if he needs to continue Xarelto given that he only had 1 episode of atrial fibrillation in the setting of acute illness in 2022.  Given that he had no recurrent episodes, I think it is reasonable to hold anticoagulation and resume if he develops recurrent episodes of atrial fibrillation.  Will stop Xarelto today and add aspirin 81 mg daily given his peripheral arterial disease.  3.  Hyperlipidemia: Continue treatment with atorvastatin.  4.  Tobacco use: I discussed with him the importance of smoking cessation.  He cut down but has not been able to quit smoking  completely.  5.  Essential hypertension: Blood pressure is now well-controlled on metoprolol.  I elected to switch him to carvedilol 12.5 mg twice daily.   Disposition:   FU with me in 6 months.  Signed,  [name], MD  04/29/2022 10:08 AM    [place]

## 2022-04-29 NOTE — Patient Instructions (Addendum)
Medication Instructions:  STOP the Metoprolol STOP the Xarelto   START Carvedilol 12.5 mg twice daily START Aspirin 81 mg once daily  *If you need a refill on your cardiac medications before your next appointment, please call your pharmacy*   Lab Work: None ordered If you have labs (blood work) drawn today and your tests are completely normal, you will receive your results only by: [facility] (if you have MyChart) OR A paper copy in the mail If you have any lab test that is abnormal or we need to change your treatment, we will call you to review the results.   Testing/Procedures: Your physician has requested that you have an ankle brachial index (ABI) in January. During this test an ultrasound and blood pressure cuff are used to evaluate the arteries that supply the arms and legs with blood.  Allow thirty minutes for this exam.  There are no restrictions or special instructions.  This will take place at [facility] [address] [address], [place]  Your physician has requested that you have a lower extremity arterial duplex in January. During this test, ultrasound is used to evaluate arterial blood flow in the legs. Allow one hour for this exam. There are no restrictions or special instructions. This will take place at [facility] [address], [place]   Follow-Up: At [facility], you and your health needs are our priority.  As part of our continuing mission to provide you with exceptional heart care, we have created designated Provider Care Teams.  These Care Teams include your primary Cardiologist (physician) and Advanced Practice Providers (APPs -  Physician Assistants and Nurse Practitioners) who all work together to provide you with the care you need, when you need it.  We recommend signing up for the patient portal called "MyChart".  Sign up information is provided on this After Visit Summary.  MyChart is used  to connect with patients for Virtual Visits (Telemedicine).  Patients are able to view lab/test results, encounter notes, upcoming appointments, etc.  Non-urgent messages can be sent to your provider as well.   To learn more about what you can do with MyChart, go to [email or web address].    Your next appointment:   6 month(s)  The format for your next appointment:   In Person  Provider:   You may see Dr. [name] or one of the following Advanced Practice Providers on your designated Care Team:   Murray Hodgkins, NP Christell Faith, PA-C Cadence Kathlen Mody, PA-C Gerrie Nordmann, NP    Important Information About Sugar

## 2022-08-03 ENCOUNTER — Other Ambulatory Visit: Payer: Self-pay

## 2022-08-03 MED ORDER — ATORVASTATIN CALCIUM 10 MG PO TABS: 10.0000 mg | ORAL_TABLET | Freq: Every day | ORAL | 0 refills | Status: DC

## 2022-08-05 ENCOUNTER — Other Ambulatory Visit: Payer: Self-pay | Admitting: Cardiovascular Disease

## 2022-09-14 ENCOUNTER — Ambulatory Visit: Payer: Self-pay | Admitting: Surgery

## 2022-09-14 NOTE — H&P (Signed)
Subjective:   CC: Lipoma of flank [D17.1]  HPI:  Henry Hill is a 62 y.o. male who was referred by Sandie Ano, MD for evaluation of above. First noted several years ago. Growing in size.    Past Medical History:  has a past medical history of Hypertension.  Past Surgical History:  has no past surgical history on file.  Family History: family history includes High blood pressure (Hypertension) in his father.  Social History:  reports that he has been smoking cigarettes. He has never used smokeless tobacco. He reports current alcohol use of about 35.0 standard drinks of alcohol per week. He reports that he does not use drugs.  Current Medications: has a current medication list which includes the following prescription(s): aspirin, atorvastatin, losartan, metoprolol tartrate, and multivitamin.  Allergies:  No Known Allergies  ROS:  A 15 point review of systems was performed and pertinent positives and negatives noted in HPI   Objective:     BP (!) 179/103   Pulse 66   Ht 182.9 cm (6' 0.01")   Wt 88.4 kg (194 lb 14.2 oz)   BMI 26.43 kg/m   Constitutional :  No distress, cooperative, alert  Lymphatics/Throat:  Supple with no lymphadenopathy  Respiratory:  Clear to auscultation bilaterally  Cardiovascular:  Regular rate and rhythm  Gastrointestinal: Soft, non-tender, non-distended, no organomegaly.  Musculoskeletal: Steady gait and movement  Skin: Cool and moist, lipoma left flank, 6cm ish.  Psychiatric: Normal affect, non-agitated, not confused         LABS:  N/a   RADS: N/a  Assessment:      Lipoma of flank [D17.1]  Plan:     1. Lipoma of flank [D17.1] Discussed surgical excision.  Alternatives include continued observation.  Benefits include possible symptom relief, pathologic evaluation, improved cosmesis. Discussed the risk of surgery including recurrence, chronic pain, post-op infxn, poor cosmesis, poor/delayed wound healing, and possible re-operation  to address said risks. The risks of general anesthetic, if used, includes MI, CVA, sudden death or even reaction to anesthetic medications also discussed.  Typical post-op recovery time of 3-5 days with possible activity restrictions were also discussed.  The patient verbalized understanding and all questions were answered to the patient's satisfaction.  2. Patient has elected to proceed with surgical treatment. Procedure will be scheduled. Right side down positioning.  labs/images/medications/previous chart entries reviewed personally and relevant changes/updates noted above.

## 2022-09-14 NOTE — H&P (View-Only) (Signed)
Subjective:   CC: Lipoma of flank [D17.1]  HPI:  Henry Hill is a 62 y.o. male who was referred by James F Hedrick, MD for evaluation of above. First noted several years ago. Growing in size.    Past Medical History:  has a past medical history of Hypertension.  Past Surgical History:  has no past surgical history on file.  Family History: family history includes High blood pressure (Hypertension) in his father.  Social History:  reports that he has been smoking cigarettes. He has never used smokeless tobacco. He reports current alcohol use of about 35.0 standard drinks of alcohol per week. He reports that he does not use drugs.  Current Medications: has a current medication list which includes the following prescription(s): aspirin, atorvastatin, losartan, metoprolol tartrate, and multivitamin.  Allergies:  No Known Allergies  ROS:  A 15 point review of systems was performed and pertinent positives and negatives noted in HPI   Objective:     BP (!) 179/103   Pulse 66   Ht 182.9 cm (6' 0.01")   Wt 88.4 kg (194 lb 14.2 oz)   BMI 26.43 kg/m   Constitutional :  No distress, cooperative, alert  Lymphatics/Throat:  Supple with no lymphadenopathy  Respiratory:  Clear to auscultation bilaterally  Cardiovascular:  Regular rate and rhythm  Gastrointestinal: Soft, non-tender, non-distended, no organomegaly.  Musculoskeletal: Steady gait and movement  Skin: Cool and moist, lipoma left flank, 6cm ish.  Psychiatric: Normal affect, non-agitated, not confused         LABS:  N/a   RADS: N/a  Assessment:      Lipoma of flank [D17.1]  Plan:     1. Lipoma of flank [D17.1] Discussed surgical excision.  Alternatives include continued observation.  Benefits include possible symptom relief, pathologic evaluation, improved cosmesis. Discussed the risk of surgery including recurrence, chronic pain, post-op infxn, poor cosmesis, poor/delayed wound healing, and possible re-operation  to address said risks. The risks of general anesthetic, if used, includes MI, CVA, sudden death or even reaction to anesthetic medications also discussed.  Typical post-op recovery time of 3-5 days with possible activity restrictions were also discussed.  The patient verbalized understanding and all questions were answered to the patient's satisfaction.  2. Patient has elected to proceed with surgical treatment. Procedure will be scheduled. Right side down positioning.  labs/images/medications/previous chart entries reviewed personally and relevant changes/updates noted above.  

## 2022-09-17 ENCOUNTER — Telehealth: Payer: Self-pay | Admitting: *Deleted

## 2022-09-17 NOTE — Telephone Encounter (Signed)
-----   Message from Verlee Monte, NP sent at 09/17/2022  5:37 PM EDT ----- Regarding: Request for pre-operative cardiac clearance Request for pre-operative cardiac clearance:  1. What type of surgery is being performed?  EXCISION LIPOMA  2. When is this surgery scheduled?  09/24/2022  3. Type of clearance being requested (medical, pharmacy, both)? MEDICAL   4. Are there any medications that need to be held prior to surgery? ASA  5. Practice name and name of physician performing surgery?  Performing surgeon: Dr. Sung Amabile, DO Requesting clearance: Quentin Mulling, FNP-C    6. Anesthesia type (none, local, MAC, general)? GENERAL  7. What is the office phone and fax number?   Phone: [phone] Fax: [phone]  ATTENTION: Unable to create telephone message as per your standard workflow. Directed by [facility] providers to send requests for cardiac clearance to this pool for appropriate distribution to provider covering pre-operative clearances.   Quentin Mulling, MSN, APRN, FNP-C, CEN [facility]  Peri-operative Services Nurse Practitioner Phone: [phone] 09/17/22 5:37 PM

## 2022-09-20 ENCOUNTER — Encounter
Admission: RE | Admit: 2022-09-20 | Discharge: 2022-09-20 | Disposition: A | Discharge status: Home / Self Care | Payer: 59 | Source: Ambulatory Visit | Attending: Surgery | Admitting: Surgery

## 2022-09-20 ENCOUNTER — Telehealth: Payer: Self-pay | Admitting: *Deleted

## 2022-09-20 VITALS — Ht 72.0 in | Wt 195.0 lb

## 2022-09-20 DIAGNOSIS — I10 Essential (primary) hypertension: Secondary | ICD-10-CM

## 2022-09-20 DIAGNOSIS — I700 Atherosclerosis of aorta: Secondary | ICD-10-CM

## 2022-09-20 DIAGNOSIS — Z01812 Encounter for preprocedural laboratory examination: Secondary | ICD-10-CM

## 2022-09-20 HISTORY — DX: Other hemorrhoids: K64.8

## 2022-09-20 HISTORY — DX: Osteoarthritis of knee, unspecified: M17.9

## 2022-09-20 HISTORY — DX: Peripheral vascular disease, unspecified: I73.9

## 2022-09-20 HISTORY — DX: Atherosclerosis of native arteries of extremities with intermittent claudication, unspecified extremity: I70.219

## 2022-09-20 HISTORY — DX: Perianal venous thrombosis: K64.5

## 2022-09-20 HISTORY — DX: Unspecified atrial fibrillation: I48.91

## 2022-09-20 NOTE — Telephone Encounter (Signed)
   Name: Henry Hill  DOB: Jan 09, 1961  MRN: [id number]  Primary Cardiologist: Julien Nordmann, MD  Chart reviewed as part of pre-operative protocol coverage. Because of Bueford Dorfman Greathouse's past medical history and time since last visit, he will require a follow-up telephone visit in order to better assess preoperative cardiovascular risk.  Pre-op covering staff: - Please schedule appointment and call patient to inform them. If patient already had an upcoming appointment within acceptable timeframe, please add "pre-op clearance" to the appointment notes so provider is aware. - Please contact requesting surgeon's office via preferred method (i.e, phone, fax) to inform them of need for appointment prior to surgery.  Excision of a lipoma is a low bleeding risk procedure.  If possible, would continue aspirin throughout the perioperative period.  However, if bleeding risk is too high may hold aspirin x 5 days prior to the procedure and resume when medically safe to do so.  Sharlene Dory, PA-C  09/20/2022, 7:51 AM

## 2022-09-20 NOTE — Telephone Encounter (Signed)
Pt has been scheduled for tele pre op appt 09/21/22 @ 1:40 , add on due to procedure date. Med rec and consent are done.      Patient Consent for Virtual Visit        Henry Hill has provided verbal consent on 09/20/2022 for a virtual visit (video or telephone).   CONSENT FOR VIRTUAL VISIT FOR:  Henry Hill  By participating in this virtual visit I agree to the following:  I hereby voluntarily request, consent and authorize [facility] and its employed or contracted physicians, physician assistants, nurse practitioners or other licensed health care professionals (the Practitioner), to provide me with telemedicine health care services (the "Services") as deemed necessary by the treating Practitioner. I acknowledge and consent to receive the Services by the Practitioner via telemedicine. I understand that the telemedicine visit will involve communicating with the Practitioner through live audiovisual communication technology and the disclosure of certain medical information by electronic transmission. I acknowledge that I have been given the opportunity to request an in-person assessment or other available alternative prior to the telemedicine visit and am voluntarily participating in the telemedicine visit.  I understand that I have the right to withhold or withdraw my consent to the use of telemedicine in the course of my care at any time, without affecting my right to future care or treatment, and that the Practitioner or I may terminate the telemedicine visit at any time. I understand that I have the right to inspect all information obtained and/or recorded in the course of the telemedicine visit and may receive copies of available information for a reasonable fee.  I understand that some of the potential risks of receiving the Services via telemedicine include:  Delay or interruption in medical evaluation due to technological equipment failure or disruption; Information transmitted  may not be sufficient (e.g. poor resolution of images) to allow for appropriate medical decision making by the Practitioner; and/or  In rare instances, security protocols could fail, causing a breach of personal health information.  Furthermore, I acknowledge that it is my responsibility to provide information about my medical history, conditions and care that is complete and accurate to the best of my ability. I acknowledge that Practitioner's advice, recommendations, and/or decision may be based on factors not within their control, such as incomplete or inaccurate data provided by me or distortions of diagnostic images or specimens that may result from electronic transmissions. I understand that the practice of medicine is not an exact science and that Practitioner makes no warranties or guarantees regarding treatment outcomes. I acknowledge that a copy of this consent can be made available to me via my patient portal ([facility] MyChart), or I can request a printed copy by calling the office of [facility].    I understand that my insurance will be billed for this visit.   I have read or had this consent read to me. I understand the contents of this consent, which adequately explains the benefits and risks of the Services being provided via telemedicine.  I have been provided ample opportunity to ask questions regarding this consent and the Services and have had my questions answered to my satisfaction. I give my informed consent for the services to be provided through the use of telemedicine in my medical care    

## 2022-09-20 NOTE — Telephone Encounter (Signed)
Pt has been scheduled for tele pre op appt 09/21/22 @ 1:40 , add on due to procedure date. Med rec and consent are done.      Patient Consent for Virtual Visit        Henry Hill has provided verbal consent on 09/20/2022 for a virtual visit (video or telephone).   CONSENT FOR VIRTUAL VISIT FOR:  Henry Hill  By participating in this virtual visit I agree to the following:  I hereby voluntarily request, consent and authorize [facility] and its employed or contracted physicians, physician assistants, nurse practitioners or other licensed health care professionals (the Practitioner), to provide me with telemedicine health care services (the "Services") as deemed necessary by the treating Practitioner. I acknowledge and consent to receive the Services by the Practitioner via telemedicine. I understand that the telemedicine visit will involve communicating with the Practitioner through live audiovisual communication technology and the disclosure of certain medical information by electronic transmission. I acknowledge that I have been given the opportunity to request an in-person assessment or other available alternative prior to the telemedicine visit and am voluntarily participating in the telemedicine visit.  I understand that I have the right to withhold or withdraw my consent to the use of telemedicine in the course of my care at any time, without affecting my right to future care or treatment, and that the Practitioner or I may terminate the telemedicine visit at any time. I understand that I have the right to inspect all information obtained and/or recorded in the course of the telemedicine visit and may receive copies of available information for a reasonable fee.  I understand that some of the potential risks of receiving the Services via telemedicine include:  Delay or interruption in medical evaluation due to technological equipment failure or disruption; Information transmitted  may not be sufficient (e.g. poor resolution of images) to allow for appropriate medical decision making by the Practitioner; and/or  In rare instances, security protocols could fail, causing a breach of personal health information.  Furthermore, I acknowledge that it is my responsibility to provide information about my medical history, conditions and care that is complete and accurate to the best of my ability. I acknowledge that Practitioner's advice, recommendations, and/or decision may be based on factors not within their control, such as incomplete or inaccurate data provided by me or distortions of diagnostic images or specimens that may result from electronic transmissions. I understand that the practice of medicine is not an exact science and that Practitioner makes no warranties or guarantees regarding treatment outcomes. I acknowledge that a copy of this consent can be made available to me via my patient portal [facility] MyChart), or I can request a printed copy by calling the office of [facility].    I understand that my insurance will be billed for this visit.   I have read or had this consent read to me. I understand the contents of this consent, which adequately explains the benefits and risks of the Services being provided via telemedicine.  I have been provided ample opportunity to ask questions regarding this consent and the Services and have had my questions answered to my satisfaction. I give my informed consent for the services to be provided through the use of telemedicine in my medical care

## 2022-09-20 NOTE — Patient Instructions (Addendum)
Your procedure is scheduled on: Friday, May 17 Report to the Registration Desk on the 1st floor of the [facility] To find out your arrival time, please call [phone] between 1PM - 3PM on: Thursday, May 16 If your arrival time is 6:00 am, do not arrive before that time as the [facility] entrance doors do not open until 6:00 am.  REMEMBER: Instructions that are not followed completely may result in serious medical risk, up to and including death; or upon the discretion of your surgeon and anesthesiologist your surgery may need to be rescheduled.  Do not eat food after midnight the night before surgery.  No gum chewing or hard candies.  You may however, drink CLEAR liquids up to 2 hours before you are scheduled to arrive for your surgery. Do not drink anything within 2 hours of your scheduled arrival time.  Clear liquids include: - water  - apple juice without pulp - gatorade (not RED colors) - black coffee or tea (Do NOT add milk or creamers to the coffee or tea) Do NOT drink anything that is not on this list.  One week prior to surgery: starting today, May 13 Stop Anti-inflammatories (NSAIDS) such as Advil, Aleve, Ibuprofen, Motrin, Naproxen, Naprosyn and Aspirin based products such as Excedrin, Goody's Powder, BC Powder. Stop ANY OVER THE COUNTER supplements until after surgery. Stop multiple vitamin. You may however, continue to take Tylenol if needed for pain up until the day of surgery.  Per Dr. Tonna Boehringer; stop aspirin Sunday May 12.  Continue taking all prescribed medications   TAKE ONLY THESE MEDICATIONS THE MORNING OF SURGERY WITH A SIP OF WATER:  Metoprolol  No Alcohol for 24 hours before or after surgery.  No Smoking including e-cigarettes for 24 hours before surgery.  No chewable tobacco products for at least 6 hours before surgery.  No nicotine patches on the day of surgery.  Do not use any "recreational" drugs for at least a week (preferably 2 weeks) before  your surgery.  Please be advised that the combination of cocaine and anesthesia may have negative outcomes, up to and including death. If you test positive for cocaine, your surgery will be cancelled.  On the morning of surgery brush your teeth with toothpaste and water, you may rinse your mouth with mouthwash if you wish. Do not swallow any toothpaste or mouthwash.  Use CHG Soap as directed on instruction sheet.  Do not wear jewelry, make-up, hairpins, clips or nail polish.  Do not wear lotions, powders, or perfumes.   Do not shave body hair from the neck down 48 hours before surgery.  Contact lenses, hearing aids and dentures may not be worn into surgery.  Do not bring valuables to the hospital. [facility] is not responsible for any missing/lost belongings or valuables.   Notify your doctor if there is any change in your medical condition (cold, fever, infection).  Wear comfortable clothing (specific to your surgery type) to the hospital.  After surgery, you can help prevent lung complications by doing breathing exercises.  Take deep breaths and cough every 1-2 hours. Your doctor may order a device called an Incentive Spirometer to help you take deep breaths.  If you are being discharged the day of surgery, you will not be allowed to drive home. You will need a responsible individual to drive you home and stay with you for 24 hours after surgery.   If you are taking public transportation, you will need to have a responsible  individual with you.  Please call the Pre-admissions Testing Dept. at [phone] if you have any questions about these instructions.  Surgery Visitation Policy:  Patients having surgery or a procedure may have two visitors.  Children under the age of 67 must have an adult with them who is not the patient.     Preparing for Surgery with CHLORHEXIDINE GLUCONATE (CHG) Soap  Chlorhexidine Gluconate (CHG) Soap  o An antiseptic cleaner that kills  germs and bonds with the skin to continue killing germs even after washing  o Used for showering the night before surgery and morning of surgery  Before surgery, you can play an important role by reducing the number of germs on your skin.  CHG (Chlorhexidine gluconate) soap is an antiseptic cleanser which kills germs and bonds with the skin to continue killing germs even after washing.  Please do not use if you have an allergy to CHG or antibacterial soaps. If your skin becomes reddened/irritated stop using the CHG.  1. Shower the NIGHT BEFORE SURGERY and the MORNING OF SURGERY with CHG soap.  2. If you choose to wash your hair, wash your hair first as usual with your normal shampoo.  3. After shampooing, rinse your hair and body thoroughly to remove the shampoo.  4. Use CHG as you would any other liquid soap. You can apply CHG directly to the skin and wash gently with a scrungie or a clean washcloth.  5. Apply the CHG soap to your body only from the neck down. Do not use on open wounds or open sores. Avoid contact with your eyes, ears, mouth, and genitals (private parts). Wash face and genitals (private parts) with your normal soap.  6. Wash thoroughly, paying special attention to the area where your surgery will be performed.  7. Thoroughly rinse your body with warm water.  8. Do not shower/wash with your normal soap after using and rinsing off the CHG soap.  9. Pat yourself dry with a clean towel.  10. Wear clean pajamas to bed the night before surgery.  12. Place clean sheets on your bed the night of your first shower and do not sleep with pets.  13. Shower again with the CHG soap on the day of surgery prior to arriving at the hospital.  14. Do not apply any deodorants/lotions/powders.  15. Please wear clean clothes to the hospital.

## 2022-09-21 ENCOUNTER — Ambulatory Visit: Payer: 59 | Attending: Cardiology

## 2022-09-21 ENCOUNTER — Encounter
Admission: RE | Admit: 2022-09-21 | Discharge: 2022-09-21 | Disposition: A | Discharge status: Home / Self Care | Payer: 59 | Source: Ambulatory Visit | Attending: Surgery | Admitting: Surgery

## 2022-09-21 DIAGNOSIS — Z01810 Encounter for preprocedural cardiovascular examination: Secondary | ICD-10-CM

## 2022-09-21 DIAGNOSIS — I10 Essential (primary) hypertension: Secondary | ICD-10-CM | POA: Diagnosis not present

## 2022-09-21 DIAGNOSIS — Z01812 Encounter for preprocedural laboratory examination: Secondary | ICD-10-CM | POA: Diagnosis not present

## 2022-09-21 DIAGNOSIS — I700 Atherosclerosis of aorta: Secondary | ICD-10-CM | POA: Diagnosis not present

## 2022-09-21 LAB — BASIC METABOLIC PANEL
Anion gap: 8 (ref 5–15)
BUN: 13 mg/dL (ref 8–23)
CO2: 20 mmol/L — ABNORMAL LOW (ref 22–32)
Calcium: 8.7 mg/dL — ABNORMAL LOW (ref 8.9–10.3)
Chloride: 101 mmol/L (ref 98–111)
Creatinine, Ser: 0.7 mg/dL (ref 0.61–1.24)
GFR, Estimated: >60 mL/min (ref >60)
Glucose, Bld: 97 mg/dL (ref 70–99)
Potassium: 3.8 mmol/L (ref 3.5–5.1)
Sodium: 129 mmol/L — ABNORMAL LOW (ref 135–145)

## 2022-09-21 LAB — CBC
HCT: 40.8 % (ref 39.0–52.0)
Hemoglobin: 13.6 g/dL (ref 13.0–17.0)
MCH: 33.7 pg (ref 26.0–34.0)
MCHC: 33.3 g/dL (ref 30.0–36.0)
MCV: 101.2 fL — ABNORMAL HIGH (ref 80.0–100.0)
Platelets: 289 10*3/uL (ref 150–400)
RBC: 4.03 MIL/uL — ABNORMAL LOW (ref 4.22–5.81)
RDW: 13.1 % (ref 11.5–15.5)
WBC: 8.8 10*3/uL (ref 4.0–10.5)
nRBC: 0 % (ref 0.0–0.2)

## 2022-09-21 NOTE — Progress Notes (Signed)
Virtual Visit via Telephone Note   Because of Rontavius Borroel Muha's co-morbid illnesses, he is at least at moderate risk for complications without adequate follow up.  This format is felt to be most appropriate for this patient at this time.  The patient did not have access to video technology/had technical difficulties with video requiring transitioning to audio format only (telephone).  All issues noted in this document were discussed and addressed.  No physical exam could be performed with this format.  Please refer to the patient's chart for his consent to telehealth for [facility]  Evaluation Performed:  Preoperative cardiovascular risk assessment _____________   Date:  09/21/2022   Patient ID:  Henry Hill, DOB Aug 04, 1960, MRN [id number] Patient Location:  Home Provider location:   Office  Primary Care Provider:  Jerl Mina, MD Primary Cardiologist:  Julien Nordmann, MD  Chief Complaint / Patient Profile   62 y.o. y/o male with a h/o PAD, paroxysmal atrial fibrillation, essential hypertension, tobacco use, alcohol use, probable sleep apnea, and varicose veins who is pending excision of a lipoma on 09/24/2022 and presents today for telephonic preoperative cardiovascular risk assessment.  History of Present Illness    Henry Hill is a 62 y.o. male who presents via audio/video conferencing for a telehealth visit today.  Pt was last seen in cardiology clinic on 04/29/2022 by Dr. Kirke Corin.  At that time JP DELANEY was doing well.  The patient is now pending procedure as outlined above. Since his last visit, he has had no issues with chest pain or shortness of breath.  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  Lots of walking and thinking with his job.  He is to golf but no golfing since experiencing knee problems.  He has surpassed a 4 METS on the DASI.  He is okay to hold his aspirin x 5 days.  He stopped taking it on Sunday.  Surgery is Friday  5/17.  Past Medical History    Past Medical History:  Diagnosis Date   Aortic atherosclerosis (HCC)    Atherosclerosis of native arteries of extremity with intermittent claudication (HCC)    Atrial fibrillation with RVR (HCC)    Cecal volvulus (HCC)    a. 08/2020 s/p ex lap & R colectomy.   Diastolic dysfunction    a.) 08/2020 Echo: EF 50-55%, no rwma, Gr1 DD, nl RV size/fxn, triv MR.   Essential hypertension    ETOH abuse    HLD (hyperlipidemia)    Hypo-osmolality and hyponatremia    Internal and external thrombosed hemorrhoids    Long term current use of anticoagulant    a.) rivaroxaban   OA (osteoarthritis)    Osteoarthritis, knee    Peripheral arterial disease (HCC)    Persistent atrial fibrillation (HCC)    a.) Dx 08/2020 in setting of cecal volvulus s/p R colectomy. b.) CHA2DS2VASc = 2 (HTN, vascular disease history);  c.) rate/rhythm maintained on oral metoprolol tartrate; chronically anticoagulated with rivaroxaban   PVD (peripheral vascular disease) (HCC)    Tobacco abuse    Varicose veins of both lower extremities    Past Surgical History:  Procedure Laterality Date   ABDOMINAL AORTOGRAM W/LOWER EXTREMITY N/A 12/10/2020   Procedure: ABDOMINAL AORTOGRAM W/LOWER EXTREMITY;  Surgeon: Iran Ouch, MD;  Location: [facility];  Service: Cardiovascular;  Laterality: N/A;   BACK SURGERY  05/10/2001   ruptured disc   COLONOSCOPY WITH PROPOFOL N/A 11/24/2020  Procedure: COLONOSCOPY WITH PROPOFOL;  Surgeon: Toney Reil, MD;  Location: [facility] ENDOSCOPY;  Service: Gastroenterology;  Laterality: N/A;   ENDARTERECTOMY FEMORAL Bilateral 01/14/2021   Procedure: ENDARTERECTOMY FEMORAL;  Surgeon: Annice Needy, MD;  Location: [facility] ORS;  Service: Vascular;  Laterality: Bilateral;  Bilateral femoral endarterectomies, COMMON, SFA, AND PROFUNDA  ARTERIES    HEMORRHOID SURGERY N/A 11/13/2014   Procedure: LEFT LATERAL HEMORRHOIDECTOMY  ;  Surgeon: Claud Kelp, MD;   Location: [facility] ORS;  Service: General;  Laterality: N/A;   LAPAROTOMY Right 08/30/2020   Procedure: EXPLORATORY LAPAROTOMY WITH RIGHT COLOCTOMY;  Surgeon: Henrene Dodge, MD;  Location: [facility] ORS;  Service: General;  Laterality: Right;   TOTAL KNEE ARTHROPLASTY Left 2018   TOTAL KNEE ARTHROPLASTY Right 03/02/2022   Procedure: TOTAL KNEE ARTHROPLASTY;  Surgeon: Lyndle Herrlich, MD;  Location: [facility] ORS;  Service: Orthopedics;  Laterality: Right;    Allergies  No Known Allergies  Home Medications    Prior to Admission medications   Medication Sig Start Date End Date Taking? Authorizing Provider  aspirin EC 81 MG tablet Take 1 tablet (81 mg total) by mouth daily. Swallow whole. 04/29/22   Iran Ouch, MD  atorvastatin (LIPITOR) 10 MG tablet TAKE 1 TABLET BY MOUTH EVERY DAY Patient not taking: Reported on 09/20/2022 08/05/22   Iran Ouch, MD  losartan (COZAAR) 50 MG tablet Take 50 mg by mouth at bedtime.    [provider]  metoprolol tartrate (LOPRESSOR) 50 MG tablet Take 75 mg by mouth 2 (two) times daily.    [provider]  Multiple Vitamins-Minerals (MULTIVITAMIN ADULT PO) Take 1 tablet by mouth daily.    [provider]    Physical Exam    Vital Signs:  STRYKER JALLOW does not have vital signs available for review today.  Given telephonic nature of communication, physical exam is limited. AAOx3. NAD. Normal affect.  Speech and respirations are unlabored.  Accessory Clinical Findings    None  Assessment & Plan    1.  Preoperative Cardiovascular Risk Assessment:  Mr. Henry Hill perioperative risk of a major cardiac event is 0.9% according to the Revised Cardiac Risk Index (RCRI).  Therefore, he is at low risk for perioperative complications.   His functional capacity is good at 5.62 METs according to the [facility] Activity Status Index (DASI). Recommendations: According to ACC/AHA guidelines, no further cardiovascular testing needed.  The patient may  proceed to surgery at acceptable risk.   Antiplatelet and/or Anticoagulation Recommendations: Aspirin can be held for 5 days prior to his surgery.  Please resume Aspirin post operatively when it is felt to be safe from a bleeding standpoint.   The patient was advised that if he develops new symptoms prior to surgery to contact our office to arrange for a follow-up visit, and he verbalized understanding.   A copy of this note will be routed to requesting surgeon.  Time:   Today, I have spent 10 minutes with the patient with telehealth technology discussing medical history, symptoms, and management plan.     Sharlene Dory, PA-C  09/21/2022, 1:42 PM

## 2022-09-23 ENCOUNTER — Encounter: Payer: Self-pay | Admitting: Surgery

## 2022-09-23 NOTE — Progress Notes (Signed)
Perioperative / Anesthesia Services  Pre-Admission Testing Clinical Review / Preoperative Anesthesia Consult  Date: 09/23/22  Patient Demographics:  Name: Henry Hill DOB:   11/21/1960 MRN:   [id number]  Planned Surgical Procedure(s):    Case: [id number] Date/Time: 09/24/22 1323   Procedure: EXCISION LIPOMA (Abdomen)   Anesthesia type: General   Pre-op diagnosis: lipoma of flank D17.1   Location: [facility] OR ROOM 07 / [facility]   Surgeons: Sung Amabile, DO     NOTE: Available PAT nursing documentation and vital signs have been reviewed. Clinical nursing staff has updated patient's PMH/PSHx, current medication list, and drug allergies/intolerances to ensure comprehensive history available to assist in medical decision making as it pertains to the aforementioned surgical procedure and anticipated anesthetic course. Extensive review of available clinical information personally performed. [place] PMH and PSHx updated with any diagnoses/procedures that  may have been inadvertently omitted during his intake with the pre-admission testing department's nursing staff.  Clinical Discussion:  Henry Hill is a 62 y.o. male who is submitted for pre-surgical anesthesia review and clearance prior to him undergoing the above procedure. Patient is a Current Smoker. Pertinent PMH includes: atrial fibrillation, diastolic dysfunction, PVD with claudication, aortic atherosclerosis, HTN, HLD, OA, lipoma, ETOH and tobacco abuse.   Patient is followed by cardiology Kirke Corin, MD). He was last seen in the cardiology clinic on 04/29/2022; notes reviewed. At the time of his clinic visit, patient doing well overall from a cardiovascular perspective. Patient with complaints of intermittent claudication pain in his BILATERAL lower extremities. Patient denied any chest pain, shortness of breath, PND, orthopnea, palpitations, significant peripheral edema, weakness, fatigue, vertiginous symptoms, or  presyncope/syncope. Patient with a past medical history significant for cardiovascular diagnoses. Documented physical exam was grossly benign, providing no evidence of acute exacerbation and/or decompensation of the patient's known cardiovascular conditions.  TTE performed on 08/31/2020 revealed a normal left ventricular systolic function with mild LVH.  LVEF 50-55%.  Diastolic parameters consistent with impaired relaxation (G1DD).  There was trivial mitral valve regurgitation noted.  No evidence of mitral or aortic valve stenosis.   Myocardial perfusion imaging study performed on 10/09/2020 revealed a low normal LVEF of 50%.  Attenuation images show significant aortic and coronary calcifications.  There was a small perfusion defect noted in the apex location likely due to apical thinning artifact.  Study determined to be suboptimal due to intense GI uptake and diaphragmatic attenuation.  There was no stress-induced arrhythmia noted. Study determined to be normal and low risk.   Lower extremity arterial duplex performed on 11/03/2020 revealed a 50-74% stenosis of the RIGHT common femoral artery and 75-99% stenosis of the LEFT common femoral artery.  Heavy irregular calcified plaques noted in the CFA, proximal PFA, and SFA bilaterally.  Short segment popliteal artery occlusion could not be excluded on the LEFT.   Vascular ABI/TBI studies performed on 11/03/2020 revealing moderate lower extremity arterial disease BILATERALLY.  TBIs were normal BILATERALLY.   Abdominal aortogram was performed on 12/10/2020 revealing no significant aortoiliac disease.  There was severe subtotal occlusion of the CFA extending into the ostium in both SFA and profunda, in addition to an occluded distal SFA with three-vessel runoff below the knee noted in the RIGHT lower extremity.  Contralaterally on the LEFT, significant heavily calcified CFA stenosis extending into the ostium of the SFA, in addition to short occlusion of the  proximal to mid SFA and occluded proximal popliteal artery and three-vessel runoff below the knee.  Patient underwent BILATERAL femoral endarterectomies on 01/14/2021.  Patient with an atrial fibrillation diagnosis; CHA2DS2-VASc Score = 2 (age, vascular disease history). His rate and rhythm are currently being maintained on oral metoprolol tartrate.  Patient was on oral rivaroxaban therapy, however requested to stop this medication.  In light of the fact that his atrial fibrillation has been controlled with no significant recurrent episodes, the decision was made to discontinue rivaroxaban and start patient on daily low-dose ASA.  Blood pressure uncontrolled at 170/104 mmHg on currently prescribed beta-blocker (metoprolol tartrate) and ARB (losartan) therapies. He is on a atorvastatin for his HLD diagnosis and further ASCVD prevention.  Patient is not diabetic. Patient does not have an OSAH diagnosis.  No other changes were made to his medication regimen.  Patient to follow-up with outpatient cardiology in 6 months or sooner if needed  Henry Hill is scheduled for an elective EXCISION LIPOMA (Abdomen) on 09/24/2022 with Dr. Sung Amabile, DO. Given patient's past medical history significant for cardiovascular diagnoses, presurgical cardiac clearance was sought by the PAT team. Per cardiology, "Mr. Drakeford's perioperative risk of a major cardiac event is 0.9% according to the Revised Cardiac Risk Index (RCRI).  Therefore, he is at low risk for perioperative complications.   His functional capacity is good at 5.62 METs according to the Duke Activity Status Index (DASI). According to ACC/AHA guidelines, no further cardiovascular testing needed.  The patient may proceed to surgery at acceptable risk".  In review of his medication reconciliation, it is noted that patient is currently on prescribed daily antithrombotic therapy. He has been instructed on recommendations for holding his daily low dose ASA for 5 days  prior to his procedure with plans to restart as soon as postoperative bleeding risk felt to be minimized by his attending surgeon. The patient has been instructed that his last dose of his ASA should be on 09/18/2022.  Patient denies previous perioperative complications with anesthesia in the past. In review of the available records, it is noted that patient underwent a general + neuraxial anesthetic course here at [facility] (ASA III) in 02/2022 without documented complications.      09/20/2022    1:54 PM 04/29/2022    9:37 AM 03/02/2022    3:23 PM  Vitals with BMI  Height 6\' 0"  6\' 0"    Weight 195 lbs 197 lbs   BMI 26.44 26.71   Systolic  170 197  Diastolic  161 104  Pulse  64 67    Providers/Specialists:   NOTE: Primary physician provider listed below. Patient may have been seen by APP or partner within same practice.   PROVIDER ROLE / SPECIALTY LAST Michelle Nasuti, DO General Surgery (Surgeon) 09/14/2022  Jerl Mina, MD Primary Care Provider 09/09/2022  Lorine Bears, MD Cardiology 04/29/2022   Allergies:  Patient has no known allergies.  Current Home Medications:   No current facility-administered medications for this encounter.    aspirin EC 81 MG tablet   atorvastatin (LIPITOR) 10 MG tablet   losartan (COZAAR) 50 MG tablet   metoprolol tartrate (LOPRESSOR) 50 MG tablet   Multiple Vitamins-Minerals (MULTIVITAMIN ADULT PO)   History:   Past Medical History:  Diagnosis Date   Aortic atherosclerosis (HCC)    Atherosclerosis of native arteries of extremity with intermittent claudication (HCC)    Cecal volvulus (HCC)    a. 08/2020 s/p ex lap & R colectomy.   Diastolic dysfunction    a.) 08/2020 Echo: EF 50-55%,  no rwma, Gr1 DD, nl RV size/fxn, triv MR.   Essential hypertension    ETOH abuse    HLD (hyperlipidemia)    Hypo-osmolality and hyponatremia    Internal and external thrombosed hemorrhoids    Lipoma of flank    Long  term current use of anticoagulant    a.) rivaroxaban   OA (osteoarthritis)    Osteoarthritis, knee    Peripheral arterial disease (HCC)    Persistent atrial fibrillation (HCC)    a.) Dx 08/2020 in setting of cecal volvulus s/p R colectomy. b.) CHA2DS2VASc = 2 (HTN, vascular disease history);  c.) rate/rhythm maintained on oral metoprolol tartrate; no chronic OAC (rivaroxaban discontinued 04/2022) at this time (on ASA)   PVD (peripheral vascular disease) with claudication (HCC)    Tobacco abuse    Varicose veins of both lower extremities    Past Surgical History:  Procedure Laterality Date   ABDOMINAL AORTOGRAM W/LOWER EXTREMITY N/A 12/10/2020   Procedure: ABDOMINAL AORTOGRAM W/LOWER EXTREMITY;  Surgeon: Iran Ouch, MD;  [facility];  Service: Cardiovascular;  Laterality: N/A;   BACK SURGERY  05/10/2001   ruptured disc   COLONOSCOPY WITH PROPOFOL N/A 11/24/2020   Procedure: COLONOSCOPY WITH PROPOFOL;  Surgeon: Toney Reil, MD;  Location: [facility] ENDOSCOPY;  Service: Gastroenterology;  Laterality: N/A;   ENDARTERECTOMY FEMORAL Bilateral 01/14/2021   Procedure: ENDARTERECTOMY FEMORAL;  Surgeon: Annice Needy, MD;  Location: [facility] ORS;  Service: Vascular;  Laterality: Bilateral;  Bilateral femoral endarterectomies, COMMON, SFA, AND PROFUNDA  ARTERIES    HEMORRHOID SURGERY N/A 11/13/2014   Procedure: LEFT LATERAL HEMORRHOIDECTOMY  ;  Surgeon: Claud Kelp, MD;  Location: [facility] ORS;  Service: General;  Laterality: N/A;   LAPAROTOMY Right 08/30/2020   Procedure: EXPLORATORY LAPAROTOMY WITH RIGHT COLOCTOMY;  Surgeon: Henrene Dodge, MD;  Location: [facility] ORS;  Service: General;  Laterality: Right;   TOTAL KNEE ARTHROPLASTY Left 2018   TOTAL KNEE ARTHROPLASTY Right 03/02/2022   Procedure: TOTAL KNEE ARTHROPLASTY;  Surgeon: Lyndle Herrlich, MD;  Location: [facility] ORS;  Service: Orthopedics;  Laterality: Right;   Family History  Problem Relation Age of Onset   Heart attack  Mother    Diabetes Father    Hypertension Father    Social History   Tobacco Use   Smoking status: Every Day    Packs/day: .5    Types: Cigarettes   Smokeless tobacco: Never  Vaping Use   Vaping Use: Never used  Substance Use Topics   Alcohol use: Yes    Alcohol/week: 35.0 - 42.0 standard drinks of alcohol    Types: 35 - 42 Cans of beer per week    Comment: MODERATE USE   Drug use: No    Pertinent Clinical Results:  LABS:   Hospital Outpatient Visit on 09/21/2022  Component Date Value Ref Range Status   Sodium 09/21/2022 129 (L)  135 - 145 mmol/L Final   Potassium 09/21/2022 3.8  3.5 - 5.1 mmol/L Final   Chloride 09/21/2022 101  98 - 111 mmol/L Final   CO2 09/21/2022 20 (L)  22 - 32 mmol/L Final   Glucose, Bld 09/21/2022 97  70 - 99 mg/dL Final   Glucose reference range applies only to samples taken after fasting for at least 8 hours.   BUN 09/21/2022 13  8 - 23 mg/dL Final   Creatinine, Ser 09/21/2022 0.70  0.61 - 1.24 mg/dL Final   Calcium 16/02/9603 8.7 (L)  8.9 - 10.3 mg/dL Final  GFR, Estimated 09/21/2022 >60  >60 mL/min Final   Comment: (NOTE) Calculated using the CKD-EPI Creatinine Equation (2021)    Anion gap 09/21/2022 8  5 - 15 Final   Performed at [facility], [address] [place], [place] [address]   WBC 09/21/2022 8.8  4.0 - 10.5 K/uL Final   RBC 09/21/2022 4.03 (L)  4.22 - 5.81 MIL/uL Final   Hemoglobin 09/21/2022 13.6  13.0 - 17.0 g/dL Final   HCT 60/45/4098 40.8  39.0 - 52.0 % Final   MCV 09/21/2022 101.2 (H)  80.0 - 100.0 fL Final   MCH 09/21/2022 33.7  26.0 - 34.0 pg Final   MCHC 09/21/2022 33.3  30.0 - 36.0 g/dL Final   RDW 11/91/4782 13.1  11.5 - 15.5 % Final   Platelets 09/21/2022 289  150 - 400 K/uL Final   nRBC 09/21/2022 0.0  0.0 - 0.2 % Final   Performed at [facility], [address] [place], [place] [address]    ECG: Date: 04/29/2022 Time ECG obtained: 0942 AM Rate: 64 bpm Rhythm: normal sinus Axis (leads  I and aVF): Normal Intervals: PR 174 ms. QRS 88 ms. QTc 410 ms. ST segment and T wave changes: No evidence of acute ST segment elevation or depression Comparison: Similar to previous tracing obtained on 10/28/2021   IMAGING / PROCEDURES: VAS [place] ABI WITH/WO TBI 05/20/2021 Resting right ankle-brachial index indicates moderate right lower extremity arterial disease. The right toe-brachial index is abnormal. Resting left ankle-brachial index indicates moderate left lower extremity arterial disease. The left toe-brachial index is abnormal.    ABDOMINAL AORTOGRAM performed on 12/10/2020 No significant aortoiliac disease Right lower extremity:  Severe heavily calcified subtotal occlusion of the common femoral artery extending into the ostium of both SFA and profunda, occluded distal SFA with three-vessel runoff below the knee Left lower extremity:  Significant heavily calcified left common femoral artery stenosis extending into the ostium of the SFA, short occlusion of the proximal to mid SFA, occluded proximal popliteal artery and three-vessel runoff below the knee. Recommendations: Bilateral common femoral artery endarterectomy to improve claudication.   Consider starting with the left side given that he is more symptomatic on that side.   LOWER EXTREMITY ARTERIAL DUPLEX performed on 11/03/2020 Right:  50-74% stenosis noted in the common femoral artery.  Heavy irregular calcified plaque in the CFA and proximal PFA and SFA, with shadowing.  Cannot exclude a tighter stenosis in the CFA, and possible short segment occlusion in the distal SFA/popliteal. Left:  75-99% stenosis noted in the common femoral artery.  Heavy irregular calcified plaque in the CFA and proximal PFA and SFA, with shadowing.  Flow disturbance in distal SFA difficult to characterize, with multiple  collaterals in the distal SFA and  popliteal region.  Cannot exclude short segment popliteal artery occlusion.    MYOCARDIAL  PERFUSION IMAGING STUDY (LEXISCAN) performed on 10/09/2020 Low normal left ventricular systolic function with an EF of 50% No T wave inversion noted during stress CT attenuation images show significant aortic and coronary calcifications There is a small defect of mild severity present in the apex location likely due to apical thinning artifact Very suboptimal study due to intense GI uptake and diaphragmatic attenuation.  Consider alternative testing of suspicion for obstructive CAD is high. Normal low risk study   TRANSTHORACIC ECHOCARDIOGRAM performed on 08/31/2020 Left ventricular ejection fraction, by estimation, is 50 to 55%. The left ventricle has low normal function. The left ventricle has no regional  wall motion abnormalities. There is mild concentric left ventricular hypertrophy. Left ventricular diastolic parameters are consistent with Grade I diastolic dysfunction (impaired relaxation).  Right ventricular systolic function is normal. The right ventricular size is normal.  The mitral valve is normal in structure. Trivial mitral valve regurgitation. No evidence of mitral stenosis.  The aortic valve is normal in structure. Aortic valve regurgitation is not visualized. No aortic stenosis is present.  The inferior vena cava is normal in size with greater than 50% respiratory variability, suggesting right atrial pressure of 3 mmHg.   Impression and Plan:  Henry Hill has been referred for pre-anesthesia review and clearance prior to him undergoing the planned anesthetic and procedural courses. Available labs, pertinent testing, and imaging results were personally reviewed by me in preparation for upcoming operative/procedural course. [facility] medical record has been updated following extensive record review and patient interview with PAT staff.   This patient has been appropriately cleared by cardiology with an overall ACCEPTABLE risk of significant perioperative cardiovascular  complications. Based on clinical review performed today (09/23/22), barring any significant acute changes in the patient's overall condition, it is anticipated that he will be able to proceed with the planned surgical intervention. Any acute changes in clinical condition may necessitate his procedure being postponed and/or cancelled. Patient will meet with anesthesia team (MD and/or CRNA) on the day of his procedure for preoperative evaluation/assessment. Questions regarding anesthetic course will be fielded at that time.   Pre-surgical instructions were reviewed with the patient during his PAT appointment, and questions were fielded to satisfaction by PAT clinical staff. He has been instructed on which medications that he will need to hold prior to surgery, as well as the ones that have been deemed safe/appropriate to take on the day of his procedure. As part of the general education provided by PAT, patient made aware both verbally and in writing, that he would need to abstain from the use of any illegal substances during his perioperative course.  He was advised that failure to follow the provided instructions could necessitate case cancellation or result in serious perioperative complications up to and including death. Patient encouraged to contact PAT and/or his surgeon's office to discuss any questions or concerns that may arise prior to surgery; verbalized understanding.   Quentin Mulling, MSN, APRN, FNP-C, CEN [facility]  Peri-operative Services Nurse Practitioner Phone: [phone] Fax: [phone] 09/23/22 9:25 AM  NOTE: This note has been prepared using Dragon dictation software. Despite my best ability to proofread, there is always the potential that unintentional transcriptional errors may still occur from this process.

## 2022-09-24 ENCOUNTER — Encounter: Payer: Self-pay | Admitting: Surgery

## 2022-09-24 ENCOUNTER — Ambulatory Visit
Admission: RE | Admit: 2022-09-24 | Discharge: 2022-09-24 | Disposition: A | Discharge status: Home / Self Care | Payer: 59 | Attending: Surgery | Admitting: Surgery

## 2022-09-24 ENCOUNTER — Ambulatory Visit: Payer: 59 | Admitting: Urgent Care

## 2022-09-24 ENCOUNTER — Other Ambulatory Visit: Payer: Self-pay

## 2022-09-24 ENCOUNTER — Encounter
Admission: RE | Disposition: A | Discharge status: Home / Self Care | Payer: Self-pay | Source: Home / Self Care | Attending: Surgery

## 2022-09-24 DIAGNOSIS — I10 Essential (primary) hypertension: Secondary | ICD-10-CM | POA: Diagnosis not present

## 2022-09-24 DIAGNOSIS — L720 Epidermal cyst: Secondary | ICD-10-CM | POA: Diagnosis present

## 2022-09-24 DIAGNOSIS — Z8249 Family history of ischemic heart disease and other diseases of the circulatory system: Secondary | ICD-10-CM | POA: Diagnosis not present

## 2022-09-24 DIAGNOSIS — F17200 Nicotine dependence, unspecified, uncomplicated: Secondary | ICD-10-CM | POA: Diagnosis not present

## 2022-09-24 HISTORY — PX: MASS EXCISION: SHX2000

## 2022-09-24 HISTORY — DX: Benign lipomatous neoplasm of skin and subcutaneous tissue of trunk: D17.1

## 2022-09-24 HISTORY — DX: Peripheral vascular disease, unspecified: I73.9

## 2022-09-24 SURGERY — EXCISION MASS
Anesthesia: General | Site: Flank | Laterality: Left

## 2022-09-24 MED ORDER — SUGAMMADEX SODIUM 200 MG/2ML IV SOLN
INTRAVENOUS | Status: DC | PRN
  Administered 2022-09-24 (×2): 100 mg via INTRAVENOUS

## 2022-09-24 MED ORDER — MIDAZOLAM HCL 2 MG/2ML IJ SOLN
INTRAMUSCULAR | Status: AC
  Filled 2022-09-24: qty 2

## 2022-09-24 MED ORDER — LACTATED RINGERS IV SOLN: INTRAVENOUS | Status: DC

## 2022-09-24 MED ORDER — 0.9 % SODIUM CHLORIDE (POUR BTL) OPTIME
TOPICAL | Status: DC | PRN
  Administered 2022-09-24: 500 mL

## 2022-09-24 MED ORDER — FENTANYL CITRATE (PF) 100 MCG/2ML IJ SOLN: 25.0000 ug | INTRAMUSCULAR | Status: DC | PRN

## 2022-09-24 MED ORDER — CHLORHEXIDINE GLUCONATE CLOTH 2 % EX PADS
6.0000 | MEDICATED_PAD | Freq: Once | CUTANEOUS | Status: AC
  Administered 2022-09-24: 6 via TOPICAL

## 2022-09-24 MED ORDER — OXYCODONE HCL 5 MG/5ML PO SOLN: 5.0000 mg | Freq: Once | ORAL | Status: DC | PRN

## 2022-09-24 MED ORDER — BUPIVACAINE HCL (PF) 0.5 % IJ SOLN
INTRAMUSCULAR | Status: AC
  Filled 2022-09-24: qty 30

## 2022-09-24 MED ORDER — EPINEPHRINE PF 1 MG/ML IJ SOLN
INTRAMUSCULAR | Status: AC
  Filled 2022-09-24: qty 1

## 2022-09-24 MED ORDER — ORAL CARE MOUTH RINSE: 15.0000 mL | Freq: Once | OROMUCOSAL | Status: AC

## 2022-09-24 MED ORDER — PROPOFOL 10 MG/ML IV BOLUS
INTRAVENOUS | Status: DC | PRN
  Administered 2022-09-24: 200 mg via INTRAVENOUS

## 2022-09-24 MED ORDER — ONDANSETRON HCL 4 MG/2ML IJ SOLN
INTRAMUSCULAR | Status: AC
  Filled 2022-09-24: qty 2

## 2022-09-24 MED ORDER — FAMOTIDINE 20 MG PO TABS
20.0000 mg | ORAL_TABLET | Freq: Once | ORAL | Status: AC
  Administered 2022-09-24: 20 mg via ORAL

## 2022-09-24 MED ORDER — CEFAZOLIN SODIUM-DEXTROSE 2-4 GM/100ML-% IV SOLN
INTRAVENOUS | Status: AC
  Filled 2022-09-24: qty 100

## 2022-09-24 MED ORDER — MIDAZOLAM HCL 2 MG/2ML IJ SOLN
INTRAMUSCULAR | Status: DC | PRN
  Administered 2022-09-24: 2 mg via INTRAVENOUS

## 2022-09-24 MED ORDER — LIDOCAINE HCL (PF) 2 % IJ SOLN
INTRAMUSCULAR | Status: AC
  Filled 2022-09-24: qty 5

## 2022-09-24 MED ORDER — CHLORHEXIDINE GLUCONATE 0.12 % MT SOLN
15.0000 mL | Freq: Once | OROMUCOSAL | Status: AC
  Administered 2022-09-24: 15 mL via OROMUCOSAL

## 2022-09-24 MED ORDER — OXYCODONE HCL 5 MG PO TABS: 5.0000 mg | ORAL_TABLET | Freq: Once | ORAL | Status: DC | PRN

## 2022-09-24 MED ORDER — LIDOCAINE HCL (CARDIAC) PF 100 MG/5ML IV SOSY
PREFILLED_SYRINGE | INTRAVENOUS | Status: DC | PRN
  Administered 2022-09-24: 80 mg via INTRAVENOUS

## 2022-09-24 MED ORDER — ACETAMINOPHEN 10 MG/ML IV SOLN: 1000.0000 mg | Freq: Once | INTRAVENOUS | Status: DC | PRN

## 2022-09-24 MED ORDER — ACETAMINOPHEN 325 MG PO TABS: 650.0000 mg | ORAL_TABLET | Freq: Three times a day (TID) | ORAL | 0 refills | Status: AC | PRN

## 2022-09-24 MED ORDER — ROCURONIUM BROMIDE 10 MG/ML (PF) SYRINGE
PREFILLED_SYRINGE | INTRAVENOUS | Status: AC
  Filled 2022-09-24: qty 10

## 2022-09-24 MED ORDER — ONDANSETRON HCL 4 MG/2ML IJ SOLN
INTRAMUSCULAR | Status: DC | PRN
  Administered 2022-09-24: 4 mg via INTRAVENOUS

## 2022-09-24 MED ORDER — BUPIVACAINE-EPINEPHRINE 0.5% -1:200000 IJ SOLN
INTRAMUSCULAR | Status: DC | PRN
  Administered 2022-09-24: 35 mL

## 2022-09-24 MED ORDER — FENTANYL CITRATE (PF) 100 MCG/2ML IJ SOLN
INTRAMUSCULAR | Status: AC
  Filled 2022-09-24: qty 2

## 2022-09-24 MED ORDER — CHLORHEXIDINE GLUCONATE 0.12 % MT SOLN
OROMUCOSAL | Status: AC
  Filled 2022-09-24: qty 15

## 2022-09-24 MED ORDER — IBUPROFEN 800 MG PO TABS: 800.0000 mg | ORAL_TABLET | Freq: Three times a day (TID) | ORAL | 0 refills | Status: DC | PRN

## 2022-09-24 MED ORDER — LIDOCAINE HCL (PF) 1 % IJ SOLN
INTRAMUSCULAR | Status: AC
  Filled 2022-09-24: qty 30

## 2022-09-24 MED ORDER — DEXAMETHASONE SODIUM PHOSPHATE 10 MG/ML IJ SOLN
INTRAMUSCULAR | Status: AC
  Filled 2022-09-24: qty 1

## 2022-09-24 MED ORDER — CEFAZOLIN SODIUM-DEXTROSE 2-4 GM/100ML-% IV SOLN
2.0000 g | INTRAVENOUS | Status: AC
  Administered 2022-09-24: 2 g via INTRAVENOUS

## 2022-09-24 MED ORDER — DEXAMETHASONE SODIUM PHOSPHATE 10 MG/ML IJ SOLN
INTRAMUSCULAR | Status: DC | PRN
  Administered 2022-09-24: 10 mg via INTRAVENOUS

## 2022-09-24 MED ORDER — ONDANSETRON HCL 4 MG/2ML IJ SOLN: 4.0000 mg | Freq: Once | INTRAMUSCULAR | Status: DC | PRN

## 2022-09-24 MED ORDER — ROCURONIUM BROMIDE 100 MG/10ML IV SOLN
INTRAVENOUS | Status: DC | PRN
  Administered 2022-09-24: 40 mg via INTRAVENOUS

## 2022-09-24 MED ORDER — EPHEDRINE SULFATE (PRESSORS) 50 MG/ML IJ SOLN
INTRAMUSCULAR | Status: DC | PRN
  Administered 2022-09-24: 5 mg via INTRAVENOUS
  Administered 2022-09-24: 7.5 mg via INTRAVENOUS

## 2022-09-24 MED ORDER — DOCUSATE SODIUM 100 MG PO CAPS: 100.0000 mg | ORAL_CAPSULE | Freq: Two times a day (BID) | ORAL | 0 refills | Status: AC | PRN

## 2022-09-24 MED ORDER — FAMOTIDINE 20 MG PO TABS
ORAL_TABLET | ORAL | Status: AC
  Filled 2022-09-24: qty 1

## 2022-09-24 MED ORDER — OXYCODONE-ACETAMINOPHEN 5-325 MG PO TABS: 1.0000 | ORAL_TABLET | Freq: Three times a day (TID) | ORAL | 0 refills | Status: AC | PRN

## 2022-09-24 MED ORDER — FENTANYL CITRATE (PF) 100 MCG/2ML IJ SOLN
INTRAMUSCULAR | Status: DC | PRN
  Administered 2022-09-24: 50 ug via INTRAVENOUS

## 2022-09-24 MED ORDER — PROPOFOL 10 MG/ML IV BOLUS
INTRAVENOUS | Status: AC
  Filled 2022-09-24: qty 20

## 2022-09-24 SURGICAL SUPPLY — 35 items
ADH SKN CLS APL DERMABOND .7 (GAUZE/BANDAGES/DRESSINGS) ×2
APL PRP STRL LF DISP 70% ISPRP (MISCELLANEOUS) ×2
BLADE SURG 15 STRL LF DISP TIS (BLADE) ×3 IMPLANT
BLADE SURG 15 STRL SS (BLADE) ×2
CHLORAPREP W/TINT 26 (MISCELLANEOUS) ×3 IMPLANT
DERMABOND ADVANCED .7 DNX12 (GAUZE/BANDAGES/DRESSINGS) ×3 IMPLANT
DRAPE 3/4 80X56 (DRAPES) ×2 IMPLANT
DRAPE LAPAROTOMY 100X77 ABD (DRAPES) ×3 IMPLANT
ELECT CAUTERY BLADE 6.4 (BLADE) ×3 IMPLANT
ELECT REM PT RETURN 9FT ADLT (ELECTROSURGICAL) ×2
ELECTRODE REM PT RTRN 9FT ADLT (ELECTROSURGICAL) ×3 IMPLANT
GAUZE 4X4 16PLY ~~LOC~~+RFID DBL (SPONGE) ×2 IMPLANT
GLOVE BIOGEL PI IND STRL 7.0 (GLOVE) ×5 IMPLANT
GLOVE SURG SYN 6.5 ES PF (GLOVE) ×6 IMPLANT
GLOVE SURG SYN 6.5 PF PI (GLOVE) ×2 IMPLANT
GOWN STRL REUS W/ TWL LRG LVL3 (GOWN DISPOSABLE) ×7 IMPLANT
GOWN STRL REUS W/TWL LRG LVL3 (GOWN DISPOSABLE) ×6
KIT TURNOVER KIT A (KITS) ×3 IMPLANT
LABEL OR SOLS (LABEL) ×3 IMPLANT
MANIFOLD NEPTUNE II (INSTRUMENTS) ×3 IMPLANT
NDL HYPO 22X1.5 SAFETY MO (MISCELLANEOUS) ×2 IMPLANT
NEEDLE HYPO 22X1.5 SAFETY MO (MISCELLANEOUS) ×2 IMPLANT
NS IRRIG 1000ML POUR BTL (IV SOLUTION) ×3 IMPLANT
PACK BASIN MINOR ARMC (MISCELLANEOUS) ×3 IMPLANT
SUT ETHILON 3-0 FS-10 30 BLK (SUTURE)
SUT MNCRL 4-0 (SUTURE) ×2
SUT MNCRL 4-0 27XMFL (SUTURE) ×2
SUT VIC AB 3-0 SH 27 (SUTURE) ×2
SUT VIC AB 3-0 SH 27X BRD (SUTURE) ×3 IMPLANT
SUTURE EHLN 3-0 FS-10 30 BLK (SUTURE) IMPLANT
SUTURE MNCRL 4-0 27XMF (SUTURE) ×3 IMPLANT
SYR 30ML LL (SYRINGE) ×3 IMPLANT
TOWEL OR 17X26 4PK STRL BLUE (TOWEL DISPOSABLE) ×3 IMPLANT
TRAP FLUID SMOKE EVACUATOR (MISCELLANEOUS) ×3 IMPLANT
WATER STERILE IRR 500ML POUR (IV SOLUTION) ×2 IMPLANT

## 2022-09-24 NOTE — Transfer of Care (Signed)
Immediate Anesthesia Transfer of Care Note  Patient: Henry Hill  Procedure(s) Performed: EXCISION MASS (Left: Flank)  Patient Location: PACU  Anesthesia Type:General  Level of Consciousness: awake  Airway & Oxygen Therapy: Patient Spontanous Breathing  Post-op Assessment: Report given to RN  Post vital signs: stable  Last Vitals:  Vitals Value Taken Time  BP 150/81 09/24/22 1400  Temp 36.3 C 09/24/22 1357  Pulse 77 09/24/22 1402  Resp 18 09/24/22 1402  SpO2 100 % 09/24/22 1402  Vitals shown include unvalidated device data.  Last Pain:  Vitals:   09/24/22 1400  TempSrc:   PainSc: 0-No pain        Past Medical History:  Diagnosis Date   Aortic atherosclerosis (HCC)    Atherosclerosis of native arteries of extremity with intermittent claudication (HCC)    Cecal volvulus (HCC)    a. 08/2020 s/p ex lap & R colectomy.   Diastolic dysfunction    a.) 08/2020 Echo: EF 50-55%, no rwma, Gr1 DD, nl RV size/fxn, triv MR.   Essential hypertension    ETOH abuse    HLD (hyperlipidemia)    Hypo-osmolality and hyponatremia    Internal and external thrombosed hemorrhoids    Lipoma of flank    Long term current use of anticoagulant    a.) rivaroxaban   OA (osteoarthritis)    Osteoarthritis, knee    Peripheral arterial disease (HCC)    Persistent atrial fibrillation (HCC)    a.) Dx 08/2020 in setting of cecal volvulus s/p R colectomy. b.) CHA2DS2VASc = 2 (HTN, vascular disease history);  c.) rate/rhythm maintained on oral metoprolol tartrate; no chronic OAC (rivaroxaban discontinued 04/2022) at this time (on ASA)   PVD (peripheral vascular disease) with claudication (HCC)    Tobacco abuse    Varicose veins of both lower extremities    Past Surgical History:  Procedure Laterality Date   ABDOMINAL AORTOGRAM W/LOWER EXTREMITY N/A 12/10/2020   Procedure: ABDOMINAL AORTOGRAM W/LOWER EXTREMITY;  Surgeon: Iran Ouch, MD;  Location: [facility]VASIVE CV LAB;  Service:  Cardiovascular;  Laterality: N/A;   BACK SURGERY  05/10/2001   ruptured disc   COLONOSCOPY WITH PROPOFOL N/A 11/24/2020   Procedure: COLONOSCOPY WITH PROPOFOL;  Surgeon: Toney Reil, MD;  Location: [facility] ENDOSCOPY;  Service: Gastroenterology;  Laterality: N/A;   ENDARTERECTOMY FEMORAL Bilateral 01/14/2021   Procedure: ENDARTERECTOMY FEMORAL;  Surgeon: Annice Needy, MD;  Location: [facility] ORS;  Service: Vascular;  Laterality: Bilateral;  Bilateral femoral endarterectomies, COMMON, SFA, AND PROFUNDA  ARTERIES    HEMORRHOID SURGERY N/A 11/13/2014   Procedure: LEFT LATERAL HEMORRHOIDECTOMY  ;  Surgeon: Claud Kelp, MD;  Location: [facility]S;  Service: General;  Laterality: N/A;   LAPAROTOMY Right 08/30/2020   Procedure: EXPLORATORY LAPAROTOMY WITH RIGHT COLOCTOMY;  Surgeon: Henrene Dodge, MD;  Location: [facility] ORS;  Service: General;  Laterality: Right;   TOTAL KNEE ARTHROPLASTY Left 2018   TOTAL KNEE ARTHROPLASTY Right 03/02/2022   Procedure: TOTAL KNEE ARTHROPLASTY;  Surgeon: Lyndle Herrlich, MD;  Location: [facility] ORS;  Service: Orthopedics;  Laterality: Right;   Scheduled Meds: Continuous Infusions:  acetaminophen     lactated ringers     lactated ringers 10 mL/hr at 09/24/22 1251   PRN Meds:.acetaminophen, fentaNYL (SUBLIMAZE) injection, ondansetron (ZOFRAN) IV, oxyCODONE **OR** oxyCODONE   Complications: No notable events documented.

## 2022-09-24 NOTE — Anesthesia Procedure Notes (Signed)
Procedure Name: Intubation Date/Time: 09/24/2022 1:03 PM  Performed by: Jannet Mantis, CRNAPre-anesthesia Checklist: Patient identified, Patient being monitored, Timeout performed, Emergency Drugs available and Suction available Patient Re-evaluated:Patient Re-evaluated prior to induction Oxygen Delivery Method: Circle system utilized Preoxygenation: Pre-oxygenation with 100% oxygen Induction Type: IV induction Ventilation: Mask ventilation without difficulty Laryngoscope Size: McGraph and 4 Grade View: Grade I Tube type: Oral Tube size: 7.0 mm Number of attempts: 1 Airway Equipment and Method: Stylet Placement Confirmation: ETT inserted through vocal cords under direct vision, positive ETCO2 and breath sounds checked- equal and bilateral Secured at: 21 cm Tube secured with: Tape Dental Injury: Teeth and Oropharynx as per pre-operative assessment

## 2022-09-24 NOTE — Anesthesia Preprocedure Evaluation (Addendum)
Anesthesia Evaluation  Patient identified by MRN, date of birth, ID band Patient awake    Reviewed: Allergy & Precautions, NPO status , Patient's Chart, lab work & pertinent test results  History of Anesthesia Complications Negative for: history of anesthetic complications  Airway Mallampati: III   Neck ROM: Full    Dental   Bridge :   Pulmonary Current Smoker (1/2 - 1 ppd)Patient did not abstain from smoking.   breath sounds clear to auscultation       Cardiovascular hypertension, + Peripheral Vascular Disease  Normal cardiovascular exam+ dysrhythmias (a fib)  Rhythm:Regular Rate:Normal  ECG 04/29/22: normal sinus rhythm with minimal LVH  Myocardial perfusion 10/09/20:   No T wave inversion was noted during stress.  Defect 1: There is a small defect of mild severity present in the apex location. This is likely due to an apical thinning artifact.  The study is likely normal.  This is a low risk study.  Nuclear stress EF: 50%. LV systolic function appears to be low normal.  Very suboptimal study due to intense GI uptake and diaphragm attenuation. Consider alternative testing if suspicion for obstructive coronary artery disease is high.  CT attenuation images show significant aortic and coronary calcifications.  Echo 08/31/20:  1. Left ventricular ejection fraction, by estimation, is 50 to 55%. The left ventricle has low normal function. The left ventricle has no regional wall motion abnormalities. There is mild concentric left ventricular hypertrophy. Left ventricular diastolic parameters are consistent with Grade I diastolic dysfunction (impaired relaxation).  2. Right ventricular systolic function is normal. The right ventricular size is normal.  3. The mitral valve is normal in structure. Trivial mitral valve regurgitation. No evidence of mitral stenosis.  4. The aortic valve is normal in structure. Aortic valve regurgitation  is not visualized. No aortic stenosis is present.  5. The inferior vena cava is normal in size with greater than 50% respiratory variability, suggesting right atrial pressure of 3 mmHg.     Neuro/Psych  PSYCHIATRIC DISORDERS Anxiety     Alcohol use disorder, 6-7 beers daily    GI/Hepatic Cecal volvulus s/p colectomy 2022   Endo/Other  negative endocrine ROS    Renal/GU      Musculoskeletal  (+) Arthritis ,    Abdominal   Peds  Hematology negative hematology ROS (+)   Anesthesia Other Findings On physical exam, nose appears dark blue in color; pt states this happens sometimes.   Reviewed and agree with Edd Fabian pre-anesthesia clinical review note.    Cardiology note 04/29/22:  1.  Peripheral arterial disease : Status post  bilateral common femoral artery endarterectomy with resolution of claudication.  He has known occlusive disease affecting the SFA and popliteal arteries but his claudication is currently not lifestyle limiting.  Recommend continuing medical therapy.  I requested a follow-up ABI and duplex to be done in January.   2.  Paroxysmal atrial fibrillation: He is maintaining in sinus rhythm.  CHA2DS2-VASc score is 2.  The patient asked if he needs to continue Xarelto given that he only had 1 episode of atrial fibrillation in the setting of acute illness in 2022.  Given that he had no recurrent episodes, I think it is reasonable to hold anticoagulation and resume if he develops recurrent episodes of atrial fibrillation.  Will stop Xarelto today and add aspirin 81 mg daily given his peripheral arterial disease.   3.  Hyperlipidemia: Continue treatment with atorvastatin.   4.  Tobacco use: I discussed with  him the importance of smoking cessation.  He cut down but has not been able to quit smoking completely.   5.  Essential hypertension: Blood pressure is now well-controlled on metoprolol.  I elected to switch him to carvedilol 12.5 mg twice daily.   Disposition:    FU with me in 6 months.   Reproductive/Obstetrics                              Anesthesia Physical Anesthesia Plan  ASA: 3  Anesthesia Plan: General   Post-op Pain Management:    Induction: Intravenous  PONV Risk Score and Plan: 1 and Ondansetron, Dexamethasone and Treatment may vary due to age or medical condition  Airway Management Planned: Oral ETT  Additional Equipment:   Intra-op Plan:   Post-operative Plan: Extubation in OR  Informed Consent: I have reviewed the patients History and Physical, chart, labs and discussed the procedure including the risks, benefits and alternatives for the proposed anesthesia with the patient or authorized representative who has indicated his/her understanding and acceptance.     Dental advisory given  Plan Discussed with: CRNA  Anesthesia Plan Comments: (Patient consented for risks of anesthesia including but not limited to:  - adverse reactions to medications - damage to eyes, teeth, lips or other oral mucosa - nerve damage due to positioning  - sore throat or hoarseness - damage to heart, brain, nerves, lungs, other parts of body or loss of life  Informed patient about role of CRNA in peri- and intra-operative care.  Patient voiced understanding.)         Anesthesia Quick Evaluation

## 2022-09-24 NOTE — Discharge Instructions (Addendum)
Removal, Care After This sheet gives you information about how to care for yourself after your procedure. Your health care provider may also give you more specific instructions. If you have problems or questions, contact your health care provider. What can I expect after the procedure? After the procedure, it is common to have: Soreness. Bruising. Itching. Follow these instructions at home: site care Follow instructions from your health care provider about how to take care of your site. Make sure you: Wash your hands with soap and water before and after you change your bandage (dressing). If soap and water are not available, use hand sanitizer. Leave stitches (sutures), skin glue, or adhesive strips in place. These skin closures may need to stay in place for 2 weeks or longer. If adhesive strip edges start to loosen and curl up, you may trim the loose edges. Do not remove adhesive strips completely unless your health care provider tells you to do that. If the area bleeds or bruises, apply gentle pressure for 10 minutes. OK TO SHOWER IN 24HRS  Check your site every day for signs of infection. Check for: Redness, swelling, or pain. Fluid or blood. Warmth. Pus or a bad smell.  General instructions Rest and then return to your normal activities as told by your health care provider. RESUME ASPIRIN IN 48HRS  tylenol and advil as needed for discomfort.  Please alternate between the two every four hours as needed for pain.    Use narcotics, if prescribed, only when tylenol and motrin is not enough to control pain.  325-650mg every 8hrs to max of 3000mg/24hrs (including the 325mg in every norco dose) for the tylenol.    Advil up to 800mg per dose every 8hrs as needed for pain.   Keep all follow-up visits as told by your health care provider. This is important. Contact a health care provider if: You have redness, swelling, or pain around your site. You have fluid or blood coming from your  site. Your site feels warm to the touch. You have pus or a bad smell coming from your site. You have a fever. Your sutures, skin glue, or adhesive strips loosen or come off sooner than expected. Get help right away if: You have bleeding that does not stop with pressure or a dressing. Summary After the procedure, it is common to have some soreness, bruising, and itching at the site. Follow instructions from your health care provider about how to take care of your site. Check your site every day for signs of infection. Contact a health care provider if you have redness, swelling, or pain around your site, or your site feels warm to the touch. Keep all follow-up visits as told by your health care provider. This is important. This information is not intended to replace advice given to you by your health care provider. Make sure you discuss any questions you have with your health care provider. Document Released: 05/23/2015 Document Revised: 10/24/2017 Document Reviewed: 10/24/2017 Elsevier Interactive Patient Education  2019 [employer]  AMBULATORY SURGERY  DISCHARGE INSTRUCTIONS   The drugs that you were given will stay in your system until tomorrow so for the next 24 hours you should not:  Drive an automobile Make any legal decisions Drink any alcoholic beverage   You may resume regular meals tomorrow.  Today it is better to start with liquids and gradually work up to solid foods.  You may eat anything you prefer, but it is better to start with liquids, then soup   and crackers, and gradually work up to solid foods.   Please notify your doctor immediately if you have any unusual bleeding, trouble breathing, redness and pain at the surgery site, drainage, fever, or pain not relieved by medication.    Additional Instructions:        Please contact your physician with any problems or Same Day Surgery at [phone], Monday through Friday 6 am to 4 pm, or [place] at  [facility] Main number at [phone].  

## 2022-09-24 NOTE — Op Note (Signed)
Pre-Op Dx: LIPOMA Post-Op Dx: epidermal cyst, left flank Anesthesia: GETA EBL: Complications:  none apparent Specimen: epidermal cyst Procedure: excisional biopsy of left flank epidermal cyst Surgeon: Tonna Boehringer  Indications for procedure: See H&P  Description of Procedure:  Consent obtained, time out performed.  Patient placed in right lateral position.  Area sterilized and draped in usual position.  Local infused to area previously marked on left flank.  7cm incision made through dermis with 15blade and copious amounts of caseous discharge noted from incision, indicating lump being a large epidermal cyst rather than lipoma as initially suspected. The  6cm x 7.8cm x 3.9cm cyst then removed from surrounding tissue completely using electrocautery, passed off field pending pathology. Additional skin edges resected from incision due to concern for viability due to thin dermal layer. Wound hemostasis noted, then closed in two layer fashion with 3-0 vicryl in interrupted fashion for deep dermal layer, then running 4-0 monocryl in subcuticular fashion for epidermal layer.  Wound then dressed with dermabond.  Pt tolerated procedure well, and transferred to PACU in stable condition. Sponge and instrument count correct at end of procedure.

## 2022-09-24 NOTE — Interval H&P Note (Signed)
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  The various methods of treatment have been discussed with the patient and family. After consideration of risks, benefits and other options for treatment, the patient has consented to  Procedure(s): EXCISION LIPOMA (N/A) as a surgical intervention.  The patient's history has been reviewed, patient examined, no change in status, stable for surgery.  I have reviewed the patient's chart and labs.  Questions were answered to the patient's satisfaction.     Oleta Gunnoe Tonna Boehringer

## 2022-09-26 ENCOUNTER — Encounter: Payer: Self-pay | Admitting: Surgery

## 2022-09-27 NOTE — Anesthesia Postprocedure Evaluation (Signed)
Anesthesia Post Note  Patient: Henry Hill  Procedure(s) Performed: EXCISION MASS (Left: Flank)  Patient location during evaluation: PACU Anesthesia Type: General Level of consciousness: awake and alert, oriented and patient cooperative Pain management: pain level controlled Vital Signs Assessment: post-procedure vital signs reviewed and stable Respiratory status: spontaneous breathing, nonlabored ventilation and respiratory function stable Cardiovascular status: blood pressure returned to baseline and stable Postop Assessment: adequate PO intake Anesthetic complications: no   No notable events documented.   Last Vitals:  Vitals:   09/24/22 1430 09/24/22 1445  BP: (!) 169/95 (!) 170/84  Pulse: 62 60  Resp: 16 18  Temp: (!) 36.1 C (!) 36.1 C  SpO2: 100% 100%    Last Pain:  Vitals:   09/24/22 1445  TempSrc: Temporal  PainSc: 0-No pain                 Reed Breech

## 2022-09-29 LAB — SURGICAL PATHOLOGY

## 2022-10-29 ENCOUNTER — Other Ambulatory Visit: Payer: Self-pay | Admitting: Cardiovascular Disease

## 2022-12-10 ENCOUNTER — Encounter: Payer: Self-pay | Admitting: Cardiovascular Disease

## 2022-12-10 NOTE — Progress Notes (Signed)
Unable to contact patient regarding scheduling, letter sent, order cancelled.

## 2023-04-02 IMAGING — DX DG CHEST 1V PORT
1 series · 1 of 1 positions shown · non-contrast
Comparison: 12/04/2015.
COMPARISON: 12/04/2015.

Addendum:
CLINICAL DATA: Shortness of breath.  Evaluate for infiltrates.

EXAM:
PORTABLE CHEST 1 VIEW

[chest ap]
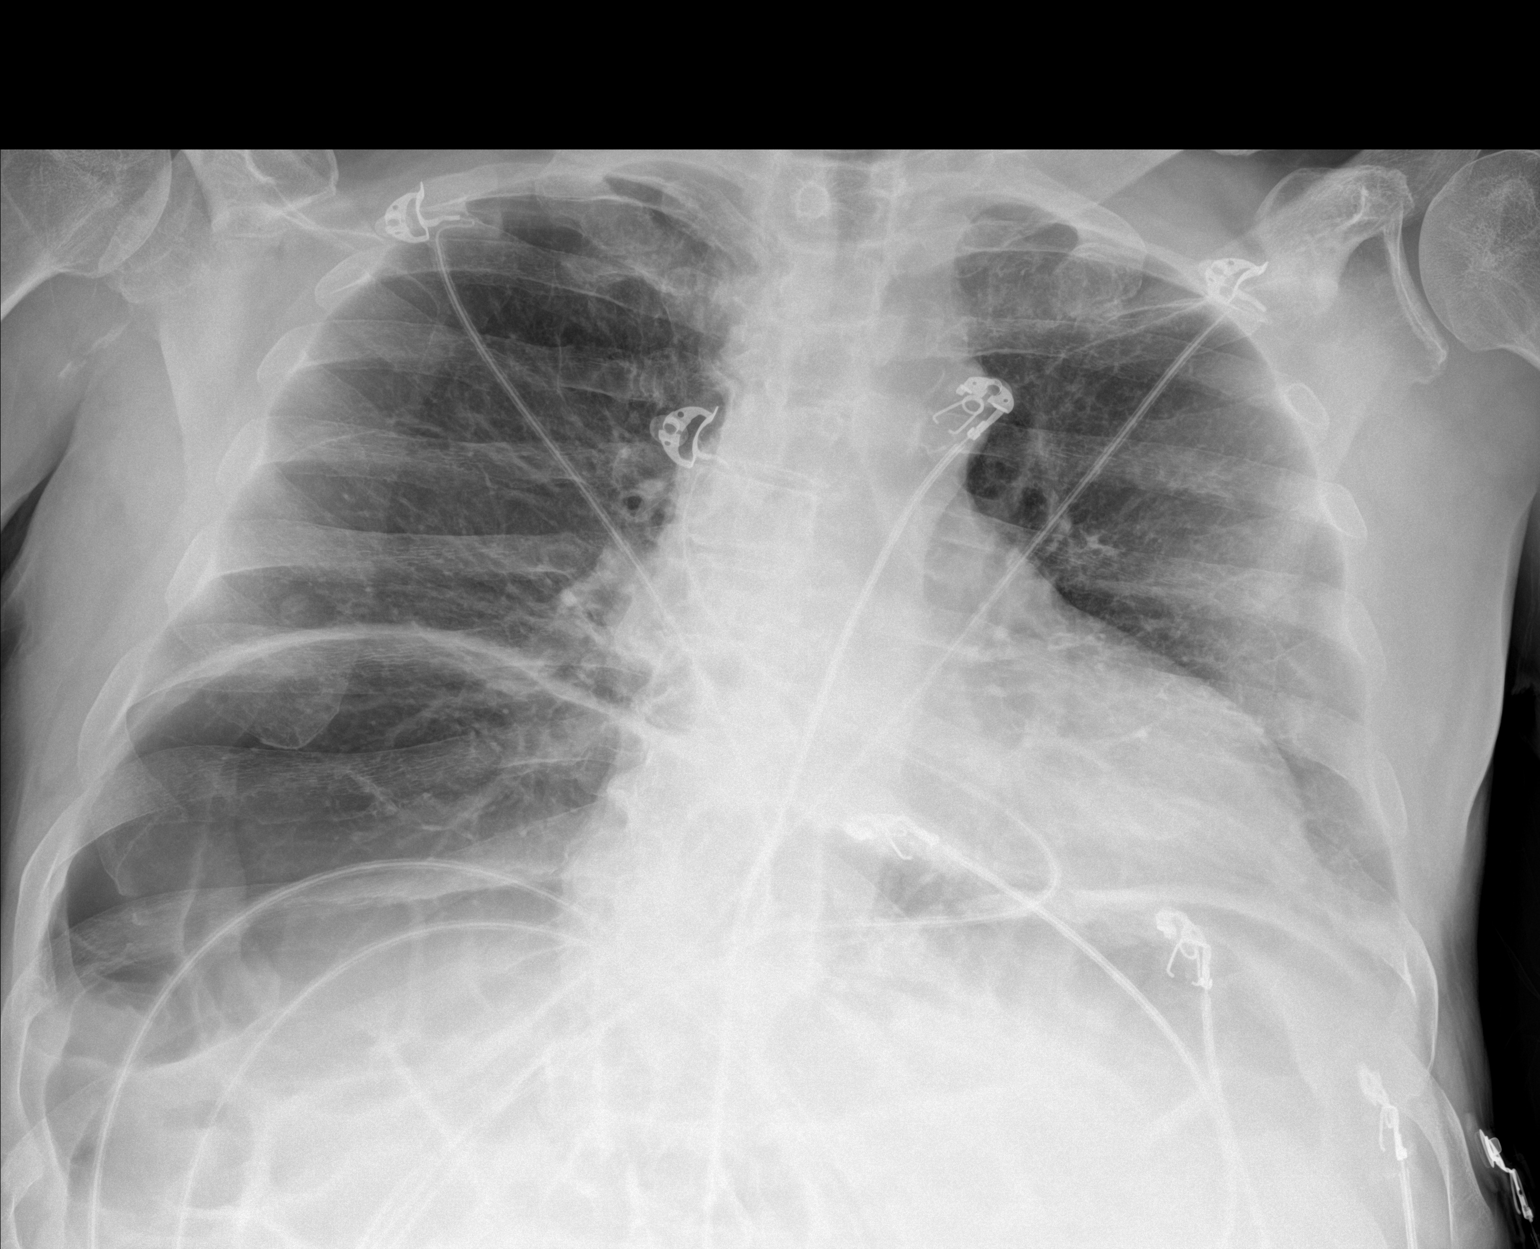

[1 of 1 positions shown; findings below may reference images not displayed]

FINDINGS: Normal heart size and mediastinal contours. Diminished lung volumes
with marked asymmetric elevation of the right hemidiaphragm. Lucency
along the undersurface of both right and left hemidiaphragm noted.
No pleural effusion or edema identified. No airspace densities.
IMPRESSION: 1. No focal pulmonary opacities.
2. Marked asymmetric elevation of the right hemidiaphragm. Lucency
along the undersurface of both right and left hemidiaphragm. Cannot
rule out pneumoperitoneum. In a patient presenting with progressive
abdominal pain and distension advise further investigation with CT
of the abdomen and pelvis.

ADDENDUM:
Critical Value/emergent results were called by telephone at the time
of interpretation on 08/30/2020 at [DATE] to provider KALU ARRUE ,
who verbally acknowledged these results.

*** End of Addendum ***
FINDINGS: Normal heart size and mediastinal contours. Diminished lung volumes
with marked asymmetric elevation of the right hemidiaphragm. Lucency
along the undersurface of both right and left hemidiaphragm noted.
No pleural effusion or edema identified. No airspace densities.
IMPRESSION: 1. No focal pulmonary opacities.
2. Marked asymmetric elevation of the right hemidiaphragm. Lucency
along the undersurface of both right and left hemidiaphragm. Cannot
rule out pneumoperitoneum. In a patient presenting with progressive
abdominal pain and distension advise further investigation with CT
of the abdomen and pelvis.

## 2024-06-09 ENCOUNTER — Emergency Department

## 2024-06-09 ENCOUNTER — Inpatient Hospital Stay
Admission: EM | Admit: 2024-06-09 | Discharge: 2024-06-13 | DRG: 336 | Disposition: A | Attending: Internal Medicine | Admitting: Internal Medicine

## 2024-06-09 ENCOUNTER — Other Ambulatory Visit: Payer: Self-pay

## 2024-06-09 DIAGNOSIS — Z96653 Presence of artificial knee joint, bilateral: Secondary | ICD-10-CM | POA: Diagnosis present

## 2024-06-09 DIAGNOSIS — Z9049 Acquired absence of other specified parts of digestive tract: Secondary | ICD-10-CM

## 2024-06-09 DIAGNOSIS — I4819 Other persistent atrial fibrillation: Secondary | ICD-10-CM | POA: Diagnosis present

## 2024-06-09 DIAGNOSIS — K56609 Unspecified intestinal obstruction, unspecified as to partial versus complete obstruction: Principal | ICD-10-CM | POA: Diagnosis present

## 2024-06-09 DIAGNOSIS — F17210 Nicotine dependence, cigarettes, uncomplicated: Secondary | ICD-10-CM | POA: Diagnosis present

## 2024-06-09 DIAGNOSIS — K565 Intestinal adhesions [bands], unspecified as to partial versus complete obstruction: Principal | ICD-10-CM | POA: Diagnosis present

## 2024-06-09 DIAGNOSIS — K439 Ventral hernia without obstruction or gangrene: Secondary | ICD-10-CM | POA: Diagnosis present

## 2024-06-09 DIAGNOSIS — I4891 Unspecified atrial fibrillation: Secondary | ICD-10-CM | POA: Diagnosis present

## 2024-06-09 DIAGNOSIS — Z7982 Long term (current) use of aspirin: Secondary | ICD-10-CM

## 2024-06-09 DIAGNOSIS — Z8249 Family history of ischemic heart disease and other diseases of the circulatory system: Secondary | ICD-10-CM

## 2024-06-09 DIAGNOSIS — I8393 Asymptomatic varicose veins of bilateral lower extremities: Secondary | ICD-10-CM | POA: Diagnosis present

## 2024-06-09 DIAGNOSIS — J449 Chronic obstructive pulmonary disease, unspecified: Secondary | ICD-10-CM | POA: Diagnosis present

## 2024-06-09 DIAGNOSIS — Z833 Family history of diabetes mellitus: Secondary | ICD-10-CM

## 2024-06-09 DIAGNOSIS — Z7901 Long term (current) use of anticoagulants: Secondary | ICD-10-CM

## 2024-06-09 DIAGNOSIS — E785 Hyperlipidemia, unspecified: Secondary | ICD-10-CM | POA: Diagnosis present

## 2024-06-09 DIAGNOSIS — E871 Hypo-osmolality and hyponatremia: Secondary | ICD-10-CM | POA: Diagnosis present

## 2024-06-09 DIAGNOSIS — D72829 Elevated white blood cell count, unspecified: Secondary | ICD-10-CM | POA: Diagnosis present

## 2024-06-09 DIAGNOSIS — I739 Peripheral vascular disease, unspecified: Secondary | ICD-10-CM | POA: Diagnosis present

## 2024-06-09 DIAGNOSIS — I10 Essential (primary) hypertension: Secondary | ICD-10-CM | POA: Diagnosis present

## 2024-06-09 DIAGNOSIS — K567 Ileus, unspecified: Secondary | ICD-10-CM | POA: Diagnosis not present

## 2024-06-09 LAB — COMPREHENSIVE METABOLIC PANEL WITH GFR
ALT: 27 U/L (ref 0–44)
AST: 31 U/L (ref 15–41)
Albumin: 4.6 g/dL (ref 3.5–5.0)
Alkaline Phosphatase: 93 U/L (ref 38–126)
Anion gap: 18 — ABNORMAL HIGH (ref 5–15)
BUN: 12 mg/dL (ref 8–23)
CO2: 20 mmol/L — ABNORMAL LOW (ref 22–32)
Calcium: 9.9 mg/dL (ref 8.9–10.3)
Chloride: 87 mmol/L — ABNORMAL LOW (ref 98–111)
Creatinine, Ser: 0.73 mg/dL (ref 0.61–1.24)
GFR, Estimated: >60 mL/min
Glucose, Bld: 136 mg/dL — ABNORMAL HIGH (ref 70–99)
Potassium: 4.2 mmol/L (ref 3.5–5.1)
Sodium: 125 mmol/L — ABNORMAL LOW (ref 135–145)
Total Bilirubin: 0.8 mg/dL (ref 0.0–1.2)
Total Protein: 7.4 g/dL (ref 6.5–8.1)

## 2024-06-09 LAB — CBC
HCT: 45.7 % (ref 39.0–52.0)
Hemoglobin: 15.7 g/dL (ref 13.0–17.0)
MCH: 33.4 pg (ref 26.0–34.0)
MCHC: 34.4 g/dL (ref 30.0–36.0)
MCV: 97.2 fL (ref 80.0–100.0)
Platelets: 295 10*3/uL (ref 150–400)
RBC: 4.7 MIL/uL (ref 4.22–5.81)
RDW: 12.5 % (ref 11.5–15.5)
WBC: 13.3 10*3/uL — ABNORMAL HIGH (ref 4.0–10.5)
nRBC: 0 % (ref 0.0–0.2)

## 2024-06-09 LAB — URINALYSIS, ROUTINE W REFLEX MICROSCOPIC
Bilirubin Urine: NEGATIVE
Glucose, UA: NEGATIVE mg/dL
Ketones, ur: 20 mg/dL — AB
Leukocytes,Ua: NEGATIVE
Nitrite: NEGATIVE
Protein, ur: 30 mg/dL — AB
Specific Gravity, Urine: >1.046 — ABNORMAL HIGH (ref 1.005–1.030)
pH: 6 (ref 5.0–8.0)

## 2024-06-09 LAB — LIPASE, BLOOD: Lipase: 27 U/L (ref 11–51)

## 2024-06-09 LAB — LACTIC ACID, PLASMA: Lactic Acid, Venous: 1.8 mmol/L (ref 0.5–1.9)

## 2024-06-09 MED ORDER — HYDRALAZINE HCL 20 MG/ML IJ SOLN
10.0000 mg | Freq: Four times a day (QID) | INTRAMUSCULAR | Status: DC | PRN
  Administered 2024-06-09 – 2024-06-12 (×4): 10 mg via INTRAVENOUS
  Filled 2024-06-09 (×5): qty 1

## 2024-06-09 MED ORDER — LOSARTAN POTASSIUM 50 MG PO TABS
100.0000 mg | ORAL_TABLET | Freq: Every day | ORAL | Status: DC
  Administered 2024-06-11 – 2024-06-13 (×3): 100 mg
  Filled 2024-06-09 (×5): qty 2

## 2024-06-09 MED ORDER — ACETAMINOPHEN 650 MG RE SUPP: 650.0000 mg | Freq: Four times a day (QID) | RECTAL | Status: DC | PRN

## 2024-06-09 MED ORDER — POLYETHYLENE GLYCOL 3350 17 G PO PACK: 17.0000 g | PACK | Freq: Every day | ORAL | Status: DC | PRN

## 2024-06-09 MED ORDER — MORPHINE SULFATE (PF) 4 MG/ML IV SOLN
4.0000 mg | Freq: Once | INTRAVENOUS | Status: AC
  Administered 2024-06-09: 4 mg via INTRAVENOUS
  Filled 2024-06-09: qty 1

## 2024-06-09 MED ORDER — SODIUM CHLORIDE 0.9 % IV BOLUS
1000.0000 mL | Freq: Once | INTRAVENOUS | Status: AC
  Administered 2024-06-09: 1000 mL via INTRAVENOUS

## 2024-06-09 MED ORDER — ENOXAPARIN SODIUM 40 MG/0.4ML IJ SOSY
40.0000 mg | PREFILLED_SYRINGE | INTRAMUSCULAR | Status: DC
  Administered 2024-06-09 – 2024-06-12 (×4): 40 mg via SUBCUTANEOUS
  Filled 2024-06-09 (×4): qty 0.4

## 2024-06-09 MED ORDER — SODIUM CHLORIDE 0.9 % IV SOLN
1.0000 mg | Freq: Once | INTRAVENOUS | Status: AC
  Administered 2024-06-09: 1 mg via INTRAVENOUS
  Filled 2024-06-09: qty 0.2

## 2024-06-09 MED ORDER — LORAZEPAM 2 MG/ML IJ SOLN
2.0000 mg | INTRAMUSCULAR | Status: DC | PRN
  Administered 2024-06-09 (×2): 2 mg via INTRAVENOUS
  Filled 2024-06-09 (×2): qty 1

## 2024-06-09 MED ORDER — MORPHINE SULFATE (PF) 2 MG/ML IV SOLN
2.0000 mg | INTRAVENOUS | Status: DC | PRN
  Administered 2024-06-09: 2 mg via INTRAVENOUS
  Filled 2024-06-09: qty 1

## 2024-06-09 MED ORDER — MORPHINE SULFATE (PF) 2 MG/ML IV SOLN
2.0000 mg | INTRAVENOUS | Status: DC | PRN
  Administered 2024-06-09 – 2024-06-11 (×4): 2 mg via INTRAVENOUS
  Filled 2024-06-09 (×4): qty 1

## 2024-06-09 MED ORDER — HYDROMORPHONE HCL 1 MG/ML IJ SOLN
0.5000 mg | Freq: Once | INTRAMUSCULAR | Status: AC
  Administered 2024-06-09: 0.5 mg via INTRAVENOUS
  Filled 2024-06-09: qty 0.5

## 2024-06-09 MED ORDER — IOHEXOL 300 MG/ML  SOLN
100.0000 mL | Freq: Once | INTRAMUSCULAR | Status: AC | PRN
  Administered 2024-06-09: 100 mL via INTRAVENOUS

## 2024-06-09 MED ORDER — ACETAMINOPHEN 325 MG PO TABS: 650.0000 mg | ORAL_TABLET | Freq: Four times a day (QID) | ORAL | Status: DC | PRN

## 2024-06-09 MED ORDER — PANTOPRAZOLE SODIUM 40 MG IV SOLR
40.0000 mg | Freq: Two times a day (BID) | INTRAVENOUS | Status: DC
  Administered 2024-06-09 – 2024-06-13 (×9): 40 mg via INTRAVENOUS
  Filled 2024-06-09 (×9): qty 10

## 2024-06-09 MED ORDER — ONDANSETRON HCL 4 MG PO TABS: 4.0000 mg | ORAL_TABLET | Freq: Four times a day (QID) | ORAL | Status: DC | PRN

## 2024-06-09 MED ORDER — ALBUMIN HUMAN 25 % IV SOLN
25.0000 g | Freq: Once | INTRAVENOUS | Status: AC
  Administered 2024-06-09: 25 g via INTRAVENOUS
  Filled 2024-06-09: qty 100

## 2024-06-09 MED ORDER — SODIUM CHLORIDE 0.9 % IV SOLN: INTRAVENOUS | Status: AC

## 2024-06-09 MED ORDER — ONDANSETRON HCL 4 MG/2ML IJ SOLN: 4.0000 mg | Freq: Four times a day (QID) | INTRAMUSCULAR | Status: DC | PRN

## 2024-06-09 MED ORDER — ONDANSETRON HCL 4 MG/2ML IJ SOLN
4.0000 mg | Freq: Once | INTRAMUSCULAR | Status: AC
  Administered 2024-06-09: 4 mg via INTRAVENOUS
  Filled 2024-06-09: qty 2

## 2024-06-09 MED ORDER — OXYCODONE HCL 5 MG PO TABS
5.0000 mg | ORAL_TABLET | ORAL | Status: DC | PRN
  Administered 2024-06-11: 5 mg via ORAL
  Filled 2024-06-09 (×2): qty 1

## 2024-06-09 MED ORDER — SODIUM CHLORIDE 0.9 % IV SOLN
12.5000 mg | Freq: Once | INTRAVENOUS | Status: AC
  Administered 2024-06-09: 12.5 mg via INTRAVENOUS
  Filled 2024-06-09: qty 12.5

## 2024-06-09 MED ORDER — THIAMINE HCL 100 MG/ML IJ SOLN
100.0000 mg | Freq: Every day | INTRAMUSCULAR | Status: DC
  Administered 2024-06-09 – 2024-06-13 (×5): 100 mg via INTRAVENOUS
  Filled 2024-06-09 (×5): qty 2

## 2024-06-09 MED ORDER — METOPROLOL TARTRATE 50 MG PO TABS
75.0000 mg | ORAL_TABLET | Freq: Two times a day (BID) | ORAL | Status: DC
  Administered 2024-06-09 – 2024-06-13 (×8): 75 mg
  Filled 2024-06-09 (×2): qty 1
  Filled 2024-06-09: qty 3
  Filled 2024-06-09: qty 1
  Filled 2024-06-09: qty 3
  Filled 2024-06-09 (×4): qty 1

## 2024-06-09 MED ORDER — PROMETHAZINE HCL 25 MG PO TABS
12.5000 mg | ORAL_TABLET | Freq: Once | ORAL | Status: DC
  Filled 2024-06-09: qty 1

## 2024-06-09 NOTE — ED Notes (Signed)
 XRAY called to do portable bedside

## 2024-06-09 NOTE — ED Notes (Signed)
 Pt made aware that we need a urine sample. Urinal placed bedside within patients reach.

## 2024-06-09 NOTE — ED Notes (Signed)
 XR bedside.

## 2024-06-09 NOTE — ED Notes (Signed)
 CCMD called and verified patient on cardiac telemetry

## 2024-06-09 NOTE — ED Provider Notes (Signed)
 "  [facility] Provider Note    Event Date/Time   First MD Initiated Contact with Patient 06/09/24 [id number]     (approximate)   History   Abdominal Pain   HPI  Henry Hill is a 64 y.o. male with a past medical history of hypertension, alcohol abuse, cecal volvulus s/p resection in 2020, presenting to the emergency department complaining of abdominal pain which began yesterday afternoon.  Patient reports associated nausea and vomiting.  He reports that the pain feels similar to when he had a twisted bowel.     Physical Exam   Triage Vital Signs: ED Triage Vitals  Encounter Vitals Group     BP 06/09/24 0750 (!) 158/104     Girls Systolic BP Percentile --      Girls Diastolic BP Percentile --      Boys Systolic BP Percentile --      Boys Diastolic BP Percentile --      Pulse Rate 06/09/24 0750 86     Resp 06/09/24 0750 20     Temp 06/09/24 0750 98.4 F (36.9 C)     Temp Source 06/09/24 0750 Oral     SpO2 06/09/24 0750 96 %     Weight 06/09/24 0749 197 lb (89.4 kg)     Height 06/09/24 0749 6' (1.829 m)     Head Circumference --      Peak Flow --      Pain Score 06/09/24 0747 8     Pain Loc --      Pain Education --      Exclude from Growth Chart --     Most recent vital signs: Vitals:   06/09/24 0812 06/09/24 0813  BP:  (!) 171/103  Pulse:  70  Resp:    Temp:    SpO2: 96% 96%     General: Awake, no distress.  CV:  Good peripheral perfusion.  Resp:  Normal effort.  Abd:  No distention.  Mid and right-sided abdominal tenderness Other:     ED Results / Procedures / Treatments   Labs (all labs ordered are listed, but only abnormal results are displayed) Labs Reviewed  LIPASE, BLOOD  COMPREHENSIVE METABOLIC PANEL WITH GFR  CBC  URINALYSIS, ROUTINE W REFLEX MICROSCOPIC     EKG     RADIOLOGY CT abdomen/pelvis IMPRESSION: 1. Small bowel obstruction with transition point in the right hemiabdomen, most likely due to  postoperative adhesions. 2. Mild mesenteric congestion adjacent to the transition point, which may indicate early compromise; surgical evaluation is recommended, with repeat imaging as needed if symptoms worsen or fail to improve.    PROCEDURES:  Critical Care performed: No  Procedures   MEDICATIONS ORDERED IN ED: Medications  morphine  (PF) 4 MG/ML injection 4 mg (has no administration in time range)  ondansetron  (ZOFRAN ) injection 4 mg (has no administration in time range)  sodium chloride  0.9 % bolus 1,000 mL (has no administration in time range)     IMPRESSION / MDM / ASSESSMENT AND PLAN / ED COURSE  I reviewed the triage vital signs and the nursing notes.                              Differential diagnosis includes, but is not limited to, biliary disease (biliary colic, acute cholecystitis, cholangitis, choledocholithiasis, etc), intrathoracic causes for epigastric abdominal pain including ACS, gastritis, duodenitis, pancreatitis, small bowel or large bowel obstruction, abdominal aortic  aneurysm, hernia, and ulcer(s).   Patient's presentation is most consistent with acute presentation with potential threat to life or bodily function.  Patient is a 64 year old male with a past medical history of hypertension, alcohol abuse, cecal volvulus s/p resection in 2020, presenting to the emergency department complaining of abdominal pain which began yesterday afternoon.  Zofran  and morphine  ordered for pain control.  CT of the abdomen lab work ordered for further evaluation  On CT scan there is a small bowel obstruction with transition point in the right hemiabdomen.  Patient reports worsening pain and further pain medication was ordered.  I discussed patient's case with Dr. Jordis, surgery, who recommended NG tube placement and medical admission.  He will see the patient in consult.  I discussed results with the patient and family and discussed plan for NG tube placement and admission.   They are agreeable.  Discussed the patient's case with the hospitalist who is in agreement with plan for admission at this time.      FINAL CLINICAL IMPRESSION(S) / ED DIAGNOSES   Final diagnoses:  SBO (small bowel obstruction) (HCC)     Rx / DC Orders   ED Discharge Orders     None        Note:  This document was prepared using Dragon voice recognition software and may include unintentional dictation errors.   Rexford Reche HERO, MD 06/09/24 1556  "

## 2024-06-09 NOTE — Consult Note (Signed)
 Patient ID: Henry Hill, male   DOB: Jul 26, 1960, 64 y.o.   MRN: [id number]  HPI Henry Hill is a 64 y.o. male seen in consultation at the request of Dr.Paudel.  He presents with acute history of abdominal pain that started yesterday.  Pain is moderate to severe colicky diffuse.  He also had some nausea and vomiting. history significant for HTN, HLD, A-fib not on anticoagulation, alcohol use,, chronic hyponatremia, chronic smoking, cecal volvulus s/p resection in 2022  Did have a CT scan that I personally reviewed showing evidence of bowel obstruction with a ventral hernia.  No evidence of pneumatosis, there is decompressed distal bowel. hyponatremic at 125, leukocytosis at 13.3,  Ordered to him he says that he has decent physiologic reserve but upon examination his oxygen saturation dropped to 87% on room air indicative of some diffusion issues consistent with COPD. I do not think he goes to doctors often, and although not listed he may have some underlying COPD  HPI  Past Medical History:  Diagnosis Date   Aortic atherosclerosis    Atherosclerosis of native arteries of extremity with intermittent claudication    Cecal volvulus (HCC)    a. 08/2020 s/p ex lap & R colectomy.   Diastolic dysfunction    a.) 08/2020 Echo: EF 50-55%, no rwma, Gr1 DD, nl RV size/fxn, triv MR.   Essential hypertension    ETOH abuse    History of colon resection 2021   HLD (hyperlipidemia)    Hypo-osmolality and hyponatremia    Internal and external thrombosed hemorrhoids    Lipoma of flank    Long term current use of anticoagulant    a.) rivaroxaban    OA (osteoarthritis)    Osteoarthritis, knee    Peripheral arterial disease    Persistent atrial fibrillation (HCC)    a.) Dx 08/2020 in setting of cecal volvulus s/p R colectomy. b.) CHA2DS2VASc = 2 (HTN, vascular disease history);  c.) rate/rhythm maintained on oral metoprolol  tartrate; no chronic OAC (rivaroxaban  discontinued 04/2022) at this time (on ASA)    PVD (peripheral vascular disease) with claudication    Tobacco abuse    Varicose veins of both lower extremities     Past Surgical History:  Procedure Laterality Date   ABDOMINAL AORTOGRAM W/LOWER EXTREMITY N/A 12/10/2020   Procedure: ABDOMINAL AORTOGRAM W/LOWER EXTREMITY;  Surgeon: Darron Deatrice LABOR, MD;  [facility];  Service: Cardiovascular;  Laterality: N/A;   BACK SURGERY  05/10/2001   ruptured disc   COLONOSCOPY WITH PROPOFOL  N/A 11/24/2020   Procedure: COLONOSCOPY WITH PROPOFOL ;  Surgeon: Unk Corinn Skiff, MD;  Location: [facility];  Service: Gastroenterology;  Laterality: N/A;   ENDARTERECTOMY FEMORAL Bilateral 01/14/2021   Procedure: ENDARTERECTOMY FEMORAL;  Surgeon: Marea Selinda RAMAN, MD;  Location: [facility];  Service: Vascular;  Laterality: Bilateral;  Bilateral femoral endarterectomies, COMMON, SFA, AND PROFUNDA  ARTERIES    HEMORRHOID SURGERY N/A 11/13/2014   Procedure: LEFT LATERAL HEMORRHOIDECTOMY  ;  Surgeon: Elon Pacini, MD;  Location: [facility];  Service: General;  Laterality: N/A;   LAPAROTOMY Right 08/30/2020   Procedure: EXPLORATORY LAPAROTOMY WITH RIGHT COLOCTOMY;  Surgeon: Desiderio Schanz, MD;  Location: [facility];  Service: General;  Laterality: Right;   MASS EXCISION Left 09/24/2022   Procedure: EXCISION MASS;  Surgeon: Tye Millet, DO;  Location: [facility];  Service: General;  Laterality: Left;   TOTAL KNEE ARTHROPLASTY Left 2018   TOTAL KNEE ARTHROPLASTY Right 03/02/2022   Procedure: TOTAL KNEE ARTHROPLASTY;  Surgeon: Leora,  Lynwood SAUNDERS, MD;  Location: [facility];  Service: Orthopedics;  Laterality: Right;    Family History  Problem Relation Age of Onset   Heart attack Mother    Diabetes Father    Hypertension Father     Social History Social History[1]  Allergies[2]  Current Facility-Administered Medications  Medication Dose Route Frequency Provider Last Rate Last Admin   0.9 %  sodium chloride  infusion   Intravenous Continuous Paudel,  Keshab, MD 125 mL/hr at 06/09/24 1246 New Bag at 06/09/24 1246   acetaminophen  (TYLENOL ) tablet 650 mg  650 mg Oral Q6H PRN Paudel, Keshab, MD       Or   acetaminophen  (TYLENOL ) suppository 650 mg  650 mg Rectal Q6H PRN Paudel, Nena, MD       enoxaparin  (LOVENOX ) injection 40 mg  40 mg Subcutaneous Q24H Paudel, Keshab, MD       folic acid  1 mg in sodium chloride  0.9 % 50 mL IVPB  1 mg Intravenous Once Paudel, Keshab, MD       hydrALAZINE  (APRESOLINE ) injection 10 mg  10 mg Intravenous Q6H PRN Paudel, Nena, MD       LORazepam  (ATIVAN ) injection 2 mg  2 mg Intravenous Q4H PRN Paudel, Nena, MD       losartan  (COZAAR ) tablet 100 mg  100 mg Per Tube Daily Paudel, Nena, MD       metoprolol  tartrate (LOPRESSOR ) tablet 75 mg  75 mg Per Tube BID Paudel, Keshab, MD       morphine  (PF) 2 MG/ML injection 2 mg  2 mg Intravenous Q4H PRN Paudel, Keshab, MD   2 mg at 06/09/24 1437   ondansetron  (ZOFRAN ) tablet 4 mg  4 mg Oral Q6H PRN Roann Nena, MD       Or   ondansetron  (ZOFRAN ) injection 4 mg  4 mg Intravenous Q6H PRN Paudel, Keshab, MD       oxyCODONE  (Oxy IR/ROXICODONE ) immediate release tablet 5 mg  5 mg Oral Q4H PRN Paudel, Keshab, MD       pantoprazole  (PROTONIX ) injection 40 mg  40 mg Intravenous Q12H Paudel, Nena, MD   40 mg at 06/09/24 1435   polyethylene glycol (MIRALAX  / GLYCOLAX ) packet 17 g  17 g Oral Daily PRN Paudel, Keshab, MD       thiamine  (VITAMIN B1) injection 100 mg  100 mg Intravenous Daily Paudel, Keshab, MD   100 mg at 06/09/24 1434   Current Outpatient Medications  Medication Sig Dispense Refill   aspirin  EC 81 MG tablet Take 1 tablet (81 mg total) by mouth daily. Swallow whole. 90 tablet 3   losartan  (COZAAR ) 100 MG tablet Take 100 mg by mouth daily.     metoprolol  tartrate (LOPRESSOR ) 50 MG tablet Take 75 mg by mouth 2 (two) times daily.     tadalafil (CIALIS) 20 MG tablet Take 20 mg by mouth daily as needed for erectile dysfunction.     Multiple Vitamins-Minerals  (MULTIVITAMIN ADULT PO) Take 1 tablet by mouth daily. (Patient not taking: Reported on 06/09/2024)       Review of Systems Full ROS  was asked and was negative except for the information on the HPI  Physical Exam Blood pressure (!) 168/102, pulse 94, temperature 98.2 F (36.8 C), temperature source Oral, resp. rate 20, height 6' (1.829 m), weight 89.4 kg, SpO2 92%. CONSTITUTIONAL: NAD. EYES: Pupils are equal, round, Sclera are non-icteric. EARS, NOSE, MOUTH AND THROAT: The oropharynx is clear. The oral mucosa is pink and  moist. Hearing is intact to voice. LYMPH NODES:  Lymph nodes in the neck are normal. RESPIRATORY:  Lungs are clear. There is normal respiratory effort, with equal breath sounds bilaterally, and without pathologic use of accessory muscles. CARDIOVASCULAR: Heart is regular without murmurs, gallops, or rubs. GI: The abdomen is  soft, mild tenderness palpation diffusely.  No peritonitis.  Large midline ventral hernia measuring approximately 10 cm in diameter.  Reducible there is no rebound there are no palpable masses. There is no hepatosplenomegaly.  GU: Rectal deferred.   MUSCULOSKELETAL: Normal muscle strength and tone. No cyanosis or edema.   SKIN: Turgor is good and there are no pathologic skin lesions or ulcers. NEUROLOGIC: Motor and sensation is grossly normal. Cranial nerves are grossly intact. PSYCH:  Oriented to person, place and time. Affect is normal.  Data Reviewed I have personally reviewed the patient's imaging, laboratory findings and medical records.    Assessment/Plan 64 year old male with a recurrent small bowel obstruction and a large ventral hernia with some loss of domain.  Cussed with the patient in detail and the family about his disease process.  He is currently not peritonitic but does have significant high output.  I do not think that he needs emergent surgical intervention.  I will like to give a trial of n.p.o. NG tube decompression and fluid  resuscitation.  They do understand that if she fails to progress we may have to do laparotomy.  This will be limited in the sense that I will likely not be able to fix the hernia in a way that we will have permanent results.  If we can avoid this and potentially do elective lysis of adhesions and abdominal wall reconstruction in the future I think is probably his best bet. We will continue aggressive fluid resuscitation with serial abdominal exams and serial abdominal x-rays.  I will continue to follow him.  I had an extensive discussion with the patient and also with his wife I personally spent a total of 75 minutes in the care of the patient today including performing a medically appropriate exam/evaluation, counseling and educating, placing orders, referring and communicating with other health care professionals, documenting clinical information in the EHR, independently interpreting and reviewing images studies and coordinating care.    Laneta Luna, MD FACS General Surgeon 06/09/2024, 3:20 PM      [1]  Social History Tobacco Use   Smoking status: Every Day    Current packs/day: 0.50    Types: Cigarettes   Smokeless tobacco: Never  Vaping Use   Vaping status: Never Used  Substance Use Topics   Alcohol use: Yes    Alcohol/week: 35.0 - 42.0 standard drinks of alcohol    Types: 35 - 42 Cans of beer per week    Comment: MODERATE USE   Drug use: No  [2] No Known Allergies

## 2024-06-09 NOTE — H&P (Signed)
 "  History and Physical    Henry Hill FMW:[id number] DOB: Oct 22, 1960 DOA: 06/09/2024  DOS: the patient was seen and examined on 06/09/2024  PCP: Henry Lynwood FALCON, MD   Patient coming from: Home  I have personally briefly reviewed patient's old medical records in [facility]  Chief Complaint: Abdominal pain nausea and vomiting  HPI: Henry Hill is a pleasant 64 y.o. male with medical history significant for HTN, HLD, A-fib not on anticoagulation, alcohol use,, chronic hyponatremia, chronic smoking, cecal volvulus s/p resection in 2022 who came into ED complaining of abdominal pain nausea vomiting began yesterday afternoon.  Patient complained that his pain is 8/10 in intensity, intermittent, getting worse with nausea and vomiting.  He denies any fever chills cough shortness of breath, hematemesis, melena.  ED Course: Upon arrival to the ED, patient is found to be hyponatremic at 125, leukocytosis at 13.3, CT abdomen and pelvis showed small bowel obstruction with transition point in the right hemiabdomen, most likely due to postoperative adhesions.  Mild mesenteric congestion adjacent to the transition point, which may indicate early compromise; surgical evaluation is recommended.  General surgery was consulted who advised to put NG tube and they will see the patient.  Hospital service was consulted for evaluation for admission.  Review of Systems:  ROS  All other systems negative except as noted in the HPI.  Past Medical History:  Diagnosis Date   Aortic atherosclerosis    Atherosclerosis of native arteries of extremity with intermittent claudication    Cecal volvulus (HCC)    a. 08/2020 s/p ex lap & R colectomy.   Diastolic dysfunction    a.) 08/2020 Echo: EF 50-55%, no rwma, Gr1 DD, nl RV size/fxn, triv MR.   Essential hypertension    ETOH abuse    History of colon resection 2021   HLD (hyperlipidemia)    Hypo-osmolality and hyponatremia    Internal and external thrombosed  hemorrhoids    Lipoma of flank    Long term current use of anticoagulant    a.) rivaroxaban    OA (osteoarthritis)    Osteoarthritis, knee    Peripheral arterial disease    Persistent atrial fibrillation (HCC)    a.) Dx 08/2020 in setting of cecal volvulus s/p R colectomy. b.) CHA2DS2VASc = 2 (HTN, vascular disease history);  c.) rate/rhythm maintained on oral metoprolol  tartrate; no chronic OAC (rivaroxaban  discontinued 04/2022) at this time (on ASA)   PVD (peripheral vascular disease) with claudication    Tobacco abuse    Varicose veins of both lower extremities     Past Surgical History:  Procedure Laterality Date   ABDOMINAL AORTOGRAM W/LOWER EXTREMITY N/A 12/10/2020   Procedure: ABDOMINAL AORTOGRAM W/LOWER EXTREMITY;  Surgeon: Darron Deatrice LABOR, MD;  Location: [facility];  Service: Cardiovascular;  Laterality: N/A;   BACK SURGERY  05/10/2001   ruptured disc   COLONOSCOPY WITH PROPOFOL  N/A 11/24/2020   Procedure: COLONOSCOPY WITH PROPOFOL ;  Surgeon: Unk Corinn Skiff, MD;  Location[facility];  Service: Gastroenterology;  Laterality: N/A;   ENDARTERECTOMY FEMORAL Bilateral 01/14/2021   Procedure: ENDARTERECTOMY FEMORAL;  Surgeon: Marea Selinda RAMAN, MD;  Location: [facility];  Service: Vascular;  Laterality: Bilateral;  Bilateral femoral endarterectomies, COMMON, SFA, AND PROFUNDA  ARTERIES    HEMORRHOID SURGERY N/A 11/13/2014   Procedure: LEFT LATERAL HEMORRHOIDECTOMY  ;  Surgeon: Elon Pacini, MD;  Location: [facility];  Service: General;  Laterality: N/A;   LAPAROTOMY Right 08/30/2020   Procedure: EXPLORATORY LAPAROTOMY WITH  RIGHT COLOCTOMY;  Surgeon: Desiderio Schanz, MD;  Location: [facility];  Service: General;  Laterality: Right;   MASS EXCISION Left 09/24/2022   Procedure: EXCISION MASS;  Surgeon: Tye Millet, DO;  Location: [facility];  Service: General;  Laterality: Left;   TOTAL KNEE ARTHROPLASTY Left 2018   TOTAL KNEE ARTHROPLASTY Right 03/02/2022   Procedure: TOTAL KNEE  ARTHROPLASTY;  Surgeon: Leora Lynwood SAUNDERS, MD;  Location: [facility];  Service: Orthopedics;  Laterality: Right;     reports that he has been smoking cigarettes. He has never used smokeless tobacco. He reports current alcohol use of about 35.0 - 42.0 standard drinks of alcohol per week. He reports that he does not use drugs.  Allergies[1]  Family History  Problem Relation Age of Onset   Heart attack Mother    Diabetes Father    Hypertension Father     Prior to Admission medications  Medication Sig Start Date End Date Taking? Authorizing Provider  aspirin  EC 81 MG tablet Take 1 tablet (81 mg total) by mouth daily. Swallow whole. 04/29/22   Darron Deatrice LABOR, MD  ibuprofen  (ADVIL ) 800 MG tablet Take 1 tablet (800 mg total) by mouth every 8 (eight) hours as needed for mild pain or moderate pain. 09/24/22   Tye, Isami, DO  losartan  (COZAAR ) 50 MG tablet Take 50 mg by mouth at bedtime.    [provider]  metoprolol  tartrate (LOPRESSOR ) 50 MG tablet Take 75 mg by mouth 2 (two) times daily.    [provider]  Multiple Vitamins-Minerals (MULTIVITAMIN ADULT PO) Take 1 tablet by mouth daily.    [provider]    Physical Exam: Vitals:   06/09/24 1130 06/09/24 1200 06/09/24 1230 06/09/24 1247  BP: (!) 174/95 (!) 178/110 (!) 166/106   Pulse: 100 95 86   Resp: 18  18   Temp:    98.2 F (36.8 C)  TempSrc:    Oral  SpO2: 95% 93% 91%   Weight:      Height:        Physical Exam   Constitutional: Alert, awake, calm, comfortable HEENT: Neck supple Respiratory: Clear to auscultation B/L, no wheezing, no rales.  Cardiovascular: Regular rate and rhythm, no murmurs / rubs / gallops. No extremity edema. 2+ pedal pulses. No carotid bruits.  Abdomen: Diffuse abdominal tenderness, exaggerated bowel sounds Musculoskeletal: no clubbing / cyanosis. Good ROM, no contractures. Normal muscle tone.  Skin: no rashes, lesions, ulcers. Neurologic: CN 2-12 grossly intact. Sensation  intact, No focal deficit identified Psychiatric: Alert and oriented x 3. Normal mood.    Labs on Admission: I have personally reviewed following labs and imaging studies  CBC: Recent Labs  Lab 06/09/24 0750  WBC 13.3*  HGB 15.7  HCT 45.7  MCV 97.2  PLT 295   Basic Metabolic Panel: Recent Labs  Lab 06/09/24 0750  NA 125*  K 4.2  CL 87*  CO2 20*  GLUCOSE 136*  BUN 12  CREATININE 0.73  CALCIUM  9.9   GFR: Estimated Creatinine Clearance: 102.4 mL/min (by C-G formula based on SCr of 0.73 mg/dL). Liver Function Tests: Recent Labs  Lab 06/09/24 0750  AST 31  ALT 27  ALKPHOS 93  BILITOT 0.8  PROT 7.4  ALBUMIN  4.6   Recent Labs  Lab 06/09/24 0750  LIPASE 27   No results for input(s): AMMONIA in the last 168 hours. Coagulation Profile: No results for input(s): INR, PROTIME in the last 168 hours. Cardiac Enzymes: No results for input(s):  CKTOTAL, CKMB, CKMBINDEX, TROPONINI, TROPONINIHS in the last 168 hours. BNP (last 3 results) No results for input(s): BNP in the last 8760 hours. HbA1C: No results for input(s): HGBA1C in the last 72 hours. CBG: No results for input(s): GLUCAP in the last 168 hours. Lipid Profile: No results for input(s): CHOL, HDL, LDLCALC, TRIG, CHOLHDL, LDLDIRECT in the last 72 hours. Thyroid Function Tests: No results for input(s): TSH, T4TOTAL, FREET4, T3FREE, THYROIDAB in the last 72 hours. Anemia Panel: No results for input(s): VITAMINB12, FOLATE, FERRITIN, TIBC, IRON, RETICCTPCT in the last 72 hours. Urine analysis:    Component Value Date/Time   COLORURINE STRAW (A) 02/18/2022 1037   APPEARANCEUR CLEAR (A) 02/18/2022 1037   LABSPEC 1.008 02/18/2022 1037   PHURINE 6.0 02/18/2022 1037   GLUCOSEU 150 (A) 02/18/2022 1037   HGBUR SMALL (A) 02/18/2022 1037   BILIRUBINUR NEGATIVE 02/18/2022 1037   BILIRUBINUR negative 01/04/2017 1116   KETONESUR NEGATIVE 02/18/2022 1037    PROTEINUR NEGATIVE 02/18/2022 1037   UROBILINOGEN 0.2 01/04/2017 1116   NITRITE NEGATIVE 02/18/2022 1037   LEUKOCYTESUR NEGATIVE 02/18/2022 1037    Radiological Exams on Admission: I have personally reviewed images DG Abdomen 1 View Result Date: 06/09/2024 EXAM: 1 VIEW XRAY OF THE ABDOMEN 06/09/2024 11:42:00 AM COMPARISON: None available. CLINICAL HISTORY: BG tube placement. FINDINGS: LINES, TUBES AND DEVICES: NG tube in place with tip in the stomach and side port below the GE junction. BOWEL: Dilated loops of bowel in the upper abdomen compatible with small bowel obstruction. SOFT TISSUES: No abnormal calcifications. BONES: No acute fracture. LUNGS: Left basilar atelectasis. IMPRESSION: 1. NG tube in place with tip in the stomach and side port below the GE junction. 2. Dilated loops of bowel in the upper abdomen compatible with small bowel obstruction. Electronically signed by: Waddell Calk MD 06/09/2024 11:56 AM EST RP Workstation: [id number]   CT ABDOMEN PELVIS W CONTRAST Result Date: 06/09/2024 EXAM: CT ABDOMEN AND PELVIS WITH CONTRAST 06/09/2024 09:41:00 AM TECHNIQUE: CT of the abdomen and pelvis was performed with the administration of 100 mL of iohexol  (OMNIPAQUE ) 300 MG/ML solution. Multiplanar reformatted images are provided for review. Automated exposure control, iterative reconstruction, and/or weight-based adjustment of the mA/kV was utilized to reduce the radiation dose to as low as reasonably achievable. COMPARISON: 09/05/2020 CLINICAL HISTORY: Mid/R sided abd pain, h/o resection. Mid/right-sided abdominal pain; history of resection. FINDINGS: LOWER CHEST: Ground glass attenuation and pleural thickening with subpleural atelectasis noted in the left base. LIVER: No suspicious liver lesion. GALLBLADDER AND BILE DUCTS: Gallbladder is unremarkable. No biliary ductal dilatation. SPLEEN: No acute abnormality. PANCREAS: No acute abnormality. ADRENAL GLANDS: No acute abnormality. KIDNEYS, URETERS  AND BLADDER: Subcentimeter, Bosniak class 2 left kidney cysts. Per consensus, no follow-up is needed for simple Bosniak type 1 and 2 renal cysts, unless the patient has a malignancy history or risk factors. No stones in the kidneys or ureters. No hydronephrosis. No perinephric or periureteral stranding. Urinary bladder is unremarkable. GI AND BOWEL: There is diffuse distention of the fluid-filled stomach with air-fluid level. The proximal and mid small bowel loops are abnormally dilated, measuring up to 4.2 cm, with air-fluid levels. Status post right hemicolectomy with enterocolonic anastomosis at the level of the proximal transverse colon. The transition point is in the right hemiabdomen. There is mild venous congestion within the adjacent mesentery. Decompressed colon. The findings are consistent with small bowel obstruction, most likely adhesive small bowel obstruction. PERITONEUM AND RETROPERITONEUM: No free fluid or fluid collections. No signs of pneumoperitoneum.  VASCULATURE: Aortic atherosclerotic calcification. Patent abdominal vascularity. Aorta is normal in caliber. LYMPH NODES: No enlarged abdominal or pelvic lymph nodes. REPRODUCTIVE ORGANS: Mild prostate gland enlargement containing dystrophic calcifications. BONES AND SOFT TISSUES: Lumbar degenerative disc disease. No acute or suspicious osseous findings. No focal soft tissue abnormality. IMPRESSION: 1. Small bowel obstruction with transition point in the right hemiabdomen, most likely due to postoperative adhesions. 2. Mild mesenteric congestion adjacent to the transition point, which may indicate early compromise; surgical evaluation is recommended, with repeat imaging as needed if symptoms worsen or fail to improve. Electronically signed by: Waddell Calk MD 06/09/2024 10:37 AM EST RP Workstation: [id number]    EKG: N/A    Assessment/Plan Principal Problem:   SBO (small bowel obstruction) (HCC) Active Problems:   Essential (primary)  hypertension   Atrial fibrillation with RVR (HCC)    Assessment and Plan: 64 year old male double/PMH of HTN, A-fib not on anticoagulation, history of cecal volvulus s/p resection in 2022 who came into ED complaining of abdominal pain nausea and vomiting started yesterday.  1.  Small bowel obstruction due to mechanical adhesion - He will be admitted to hospital as inpatient. - N.p.o., NG tube, IV fluid, pain medications, Protonix  - General Surgery consult called by EDP - Will follow the recommendation  2.  HTN - Blood pressure is stable - Patient is not able to take anything by mouth. - Hydralazine  as needed will be placed  3.  Atrial fibrillation chronic - Does not take any anticoagulation. -He has aspirin  on his medication list - Aspirin  will be given after he is able to take by mouth after general surgery evaluation  4.  Hyponatremia - It appears that he has chronic hyponatremia due to excessive beer use - Will continue to monitor after IV fluid  5.  EtOH use/chronic smoking - Patient drinks beers every day and also smokes - Extensive counseling was done - Watch for DTs - Ativan  as needed will be given     DVT prophylaxis: Lovenox  Code Status: Full Code Family Communication: Wife was at bedside  Disposition Plan: Home  Consults called: Gen Surgery  Admission status: Inpatient, Telemetry bed   Nena Rebel, MD [facility] 06/09/2024, 1:36 PM        [1] No Known Allergies  "

## 2024-06-09 NOTE — ED Notes (Signed)
 Spoke with Paudel, Keshab, MD and he is came bedside to speak with patients family about plan of care. Instructions to give pain medication first then see if patient still needs IV BP medications. Will continue to monitor patient.

## 2024-06-09 NOTE — ED Triage Notes (Signed)
 Pt to ED for cramping mid abdominal pain, intermittent since yesterday. States I think my intestines are twisted. Hx bowel resection for similar in 2020 or 2021. Has vomited 5 times today. Pt didn't take his BP med this morning. Drinks 5-6 beers daily, last drink yesterday, denies hx withdrawel.

## 2024-06-10 ENCOUNTER — Encounter: Payer: Self-pay | Admitting: Hospitalist

## 2024-06-10 ENCOUNTER — Encounter: Admission: EM | Disposition: A | Payer: Self-pay | Source: Home / Self Care | Attending: Internal Medicine

## 2024-06-10 ENCOUNTER — Inpatient Hospital Stay: Admitting: Certified Registered"

## 2024-06-10 ENCOUNTER — Inpatient Hospital Stay

## 2024-06-10 DIAGNOSIS — K56609 Unspecified intestinal obstruction, unspecified as to partial versus complete obstruction: Secondary | ICD-10-CM | POA: Diagnosis not present

## 2024-06-10 DIAGNOSIS — I4891 Unspecified atrial fibrillation: Secondary | ICD-10-CM

## 2024-06-10 DIAGNOSIS — I10 Essential (primary) hypertension: Secondary | ICD-10-CM | POA: Diagnosis not present

## 2024-06-10 LAB — CREATININE, SERUM
Creatinine, Ser: 0.7 mg/dL (ref 0.61–1.24)
GFR, Estimated: >60 mL/min

## 2024-06-10 LAB — CBC
HCT: 41.1 % (ref 39.0–52.0)
Hemoglobin: 14 g/dL (ref 13.0–17.0)
MCH: 33.6 pg (ref 26.0–34.0)
MCHC: 34.1 g/dL (ref 30.0–36.0)
MCV: 98.6 fL (ref 80.0–100.0)
Platelets: 238 10*3/uL (ref 150–400)
RBC: 4.17 MIL/uL — ABNORMAL LOW (ref 4.22–5.81)
RDW: 12.8 % (ref 11.5–15.5)
WBC: 9.5 10*3/uL (ref 4.0–10.5)
nRBC: 0 % (ref 0.0–0.2)

## 2024-06-10 LAB — MAGNESIUM: Magnesium: 2.1 mg/dL (ref 1.7–2.4)

## 2024-06-10 MED ORDER — LIDOCAINE HCL (CARDIAC) PF 100 MG/5ML IV SOSY
PREFILLED_SYRINGE | INTRAVENOUS | Status: DC | PRN
  Administered 2024-06-10: 100 mg via INTRAVENOUS

## 2024-06-10 MED ORDER — MIDAZOLAM HCL 2 MG/2ML IJ SOLN
INTRAMUSCULAR | Status: AC
  Filled 2024-06-10: qty 2

## 2024-06-10 MED ORDER — KETOROLAC TROMETHAMINE 15 MG/ML IJ SOLN
15.0000 mg | Freq: Four times a day (QID) | INTRAMUSCULAR | Status: DC
  Administered 2024-06-10 – 2024-06-13 (×7): 15 mg via INTRAVENOUS
  Filled 2024-06-10 (×8): qty 1

## 2024-06-10 MED ORDER — MIDAZOLAM HCL (PF) 2 MG/2ML IJ SOLN
INTRAMUSCULAR | Status: DC | PRN
  Administered 2024-06-10: 2 mg via INTRAVENOUS

## 2024-06-10 MED ORDER — SODIUM CHLORIDE (PF) 0.9 % IJ SOLN
INTRAMUSCULAR | Status: DC | PRN
  Administered 2024-06-10: 100 mL

## 2024-06-10 MED ORDER — 0.9 % SODIUM CHLORIDE (POUR BTL) OPTIME
TOPICAL | Status: DC | PRN
  Administered 2024-06-10: 500 mL

## 2024-06-10 MED ORDER — FENTANYL CITRATE (PF) 100 MCG/2ML IJ SOLN: 25.0000 ug | INTRAMUSCULAR | Status: DC | PRN

## 2024-06-10 MED ORDER — SODIUM CHLORIDE 0.9 % IV SOLN
2.0000 g | Freq: Two times a day (BID) | INTRAVENOUS | Status: AC
  Administered 2024-06-10 – 2024-06-11 (×2): 2 g via INTRAVENOUS
  Filled 2024-06-10 (×2): qty 2

## 2024-06-10 MED ORDER — CHLORHEXIDINE GLUCONATE CLOTH 2 % EX PADS
6.0000 | MEDICATED_PAD | Freq: Every day | CUTANEOUS | Status: DC
  Administered 2024-06-11 – 2024-06-12 (×2): 6 via TOPICAL
  Filled 2024-06-10: qty 6

## 2024-06-10 MED ORDER — PROPOFOL 10 MG/ML IV BOLUS
INTRAVENOUS | Status: AC
  Filled 2024-06-10: qty 20

## 2024-06-10 MED ORDER — DEXAMETHASONE SOD PHOSPHATE PF 10 MG/ML IJ SOLN
INTRAMUSCULAR | Status: DC | PRN
  Administered 2024-06-10: 10 mg via INTRAVENOUS

## 2024-06-10 MED ORDER — SODIUM CHLORIDE 0.9 % IV SOLN
2.0000 g | Freq: Once | INTRAVENOUS | Status: AC
  Administered 2024-06-10: 2 g via INTRAVENOUS

## 2024-06-10 MED ORDER — ROCURONIUM BROMIDE 100 MG/10ML IV SOLN
INTRAVENOUS | Status: DC | PRN
  Administered 2024-06-10: 70 mg via INTRAVENOUS

## 2024-06-10 MED ORDER — SUGAMMADEX SODIUM 200 MG/2ML IV SOLN
INTRAVENOUS | Status: DC | PRN
  Administered 2024-06-10: 200 mg via INTRAVENOUS

## 2024-06-10 MED ORDER — PROPOFOL 10 MG/ML IV BOLUS
INTRAVENOUS | Status: DC | PRN
  Administered 2024-06-10: 150 mg via INTRAVENOUS

## 2024-06-10 MED ORDER — LACTATED RINGERS IV SOLN: INTRAVENOUS | Status: DC | PRN

## 2024-06-10 MED ORDER — SEPRAFILM FOR OPTIME
ORAL_FILM | TOPICAL | Status: DC | PRN
  Administered 2024-06-10: 4 via TOPICAL

## 2024-06-10 MED ORDER — HYDROMORPHONE HCL 1 MG/ML IJ SOLN
INTRAMUSCULAR | Status: DC | PRN
  Administered 2024-06-10 (×2): .5 mg via INTRAVENOUS

## 2024-06-10 MED ORDER — FENTANYL CITRATE (PF) 100 MCG/2ML IJ SOLN
INTRAMUSCULAR | Status: DC | PRN
  Administered 2024-06-10 (×2): 50 ug via INTRAVENOUS

## 2024-06-10 MED ORDER — KETAMINE HCL 50 MG/5ML IJ SOSY
PREFILLED_SYRINGE | INTRAMUSCULAR | Status: DC | PRN
  Administered 2024-06-10: 30 mg via INTRAVENOUS

## 2024-06-10 MED ORDER — SODIUM CHLORIDE 0.9 % IV SOLN
INTRAVENOUS | Status: AC
  Filled 2024-06-10: qty 2

## 2024-06-10 MED ORDER — SODIUM CHLORIDE 0.9 % IV SOLN: INTRAVENOUS | Status: DC

## 2024-06-10 MED ORDER — HYDROMORPHONE HCL 1 MG/ML IJ SOLN
INTRAMUSCULAR | Status: AC
  Filled 2024-06-10: qty 1

## 2024-06-10 MED ORDER — FENTANYL CITRATE (PF) 100 MCG/2ML IJ SOLN
INTRAMUSCULAR | Status: AC
  Filled 2024-06-10: qty 2

## 2024-06-10 MED ORDER — ACETAMINOPHEN 10 MG/ML IV SOLN
INTRAVENOUS | Status: AC
  Filled 2024-06-10: qty 100

## 2024-06-10 MED ORDER — SODIUM CHLORIDE 0.9 % IV SOLN: INTRAVENOUS | Status: AC

## 2024-06-10 MED ORDER — ACETAMINOPHEN 10 MG/ML IV SOLN
INTRAVENOUS | Status: DC | PRN
  Administered 2024-06-10: 1000 mg via INTRAVENOUS

## 2024-06-10 MED ORDER — ACETAMINOPHEN 10 MG/ML IV SOLN
1000.0000 mg | Freq: Four times a day (QID) | INTRAVENOUS | Status: AC
  Administered 2024-06-10 – 2024-06-11 (×4): 1000 mg via INTRAVENOUS
  Filled 2024-06-10 (×4): qty 100

## 2024-06-10 MED ORDER — ONDANSETRON HCL 4 MG/2ML IJ SOLN
INTRAMUSCULAR | Status: DC | PRN
  Administered 2024-06-10: 4 mg via INTRAVENOUS

## 2024-06-10 MED ORDER — DROPERIDOL 2.5 MG/ML IJ SOLN: 0.6250 mg | Freq: Once | INTRAMUSCULAR | Status: DC | PRN

## 2024-06-10 MED ORDER — KETAMINE HCL 50 MG/5ML IJ SOSY
PREFILLED_SYRINGE | INTRAMUSCULAR | Status: AC
  Filled 2024-06-10: qty 5

## 2024-06-10 NOTE — Anesthesia Procedure Notes (Signed)
 Procedure Name: Intubation Date/Time: 06/10/2024 4:04 PM  Performed by: Landy Francena BIRCH, CRNAPre-anesthesia Checklist: Patient identified, Emergency Drugs available, Suction available and Patient being monitored Patient Re-evaluated:Patient Re-evaluated prior to induction Oxygen Delivery Method: Circle system utilized Preoxygenation: Pre-oxygenation with 100% oxygen Induction Type: IV induction Laryngoscope Size: McGrath and 3 Grade View: Grade I Tube type: Oral Tube size: 7.5 mm Number of attempts: 1 Airway Equipment and Method: Stylet, Oral airway and Bite block Placement Confirmation: ETT inserted through vocal cords under direct vision, positive ETCO2 and breath sounds checked- equal and bilateral Secured at: 23 cm Tube secured with: Tape Dental Injury: Teeth and Oropharynx as per pre-operative assessment

## 2024-06-10 NOTE — Op Note (Addendum)
 PROCEDURES: Laparotomy with  lysis of adhesions, reduction of internal hernia and primary repair large midline ventral hernia   Pre-operative Diagnosis:SBO   Post-operative Diagnosis: same   Surgeon: Laneta FALCON Lisaann Atha    Anesthesia: General endotracheal anesthesia   ASA Class: 2     Surgeon: Laneta Luna , MD FACS   Anesthesia: Gen. with endotracheal tube   Findings: Omental adhesion causing internal hernia Significant adhesions from the omentum to the small bowel, omentum to abdominal wall requiring significantly more time and effort than typically required to performed a safe operation and to restore anatomy.,  No evidence of bowel compromise and no need for bowel resection   Estimated Blood Loss: 20cc        Specimens: none          Complications: none                Condition: stable   Procedure Details  The patient was seen again in the Holding Room. The benefits, complications, treatment options, and expected outcomes were discussed with the patient. The risks of bleeding, infection, recurrence of symptoms, failure to resolve symptoms,  bowel injury, any of which could require further surgery were reviewed with the patient.   The patient was taken to Operating Room, identified  and the procedure verified.  A Time Out was held and the above information confirmed.   Prior to the induction of general anesthesia, antibiotic prophylaxis was administered. VTE prophylaxis was in place. General endotracheal anesthesia was then administered and tolerated well. After the induction, the abdomen was prepped with Chloraprep and draped in the sterile fashion. The patient was positioned in the supine position.   Generous midline laparotomy performed with 10 blade. Electrocautery used  The abdominal cavity was entered under direct visualization after elevating the fascia and incising it sharply. No injuries were encountered, large midline hernia encountered, sac was dissected and  incised. significant adhesions from the  omentum to abdominal wall and from small bowel to abdominal wall along with the dissection of hernia sac and repair added significant additional time to the operation and required significantly more time and effort than typically required to performed a safe operation and to restore anatomy. enterolysis performed with metz and some with electrocautery where feasible. After the enterolysis we found an omental band that was causing the internal hernia and obstruction and obvious transition point. This was divided. The bowel was run form treitz to colon, no perforation found and it was viable. Anastomosis widely patient. No need for resection. NG confirmed in stomach A second look showed no evidence of any bleeding or any other injuries.  Seprafilms placed to avoid adhesions We changed gloves and closed the  abdomen with 0 PDS suture in a running fashion incorporating large ventral defect using small bite technique. The skin was closed w staples. Liposomal Marcaine   was injected as bilateral TAP under direct visualization. Sterile dressing applied . Needle and laparotomy count were correct and there were no immediate occasions   Laneta Luna, MD, FACS

## 2024-06-10 NOTE — Anesthesia Preprocedure Evaluation (Signed)
 "                                  Anesthesia Evaluation  Patient identified by MRN, date of birth, ID band Patient awake    Reviewed: Allergy & Precautions, NPO status , Patient's Chart, lab work & pertinent test results  History of Anesthesia Complications Negative for: history of anesthetic complications  Airway Mallampati: III   Neck ROM: Full    Dental  (+) Dental Advidsory Given, [place] :   Pulmonary neg shortness of breath, neg COPD, neg recent URI, Current SmokerPatient did not abstain from smoking.   breath sounds clear to auscultation       Cardiovascular hypertension, (-) angina + Peripheral Vascular Disease  (-) Past MI and (-) Cardiac Stents + dysrhythmias (a fib) Atrial Fibrillation (-) Valvular Problems/Murmurs Rhythm:Regular Rate:Normal  ECG 04/29/22: normal sinus rhythm with minimal LVH  Myocardial perfusion 10/09/20:   No T wave inversion was noted during stress.  Defect 1: There is a small defect of mild severity present in the apex location. This is likely due to an apical thinning artifact.  The study is likely normal.  This is a low risk study.  Nuclear stress EF: 50%. LV systolic function appears to be low normal.  Very suboptimal study due to intense GI uptake and diaphragm attenuation. Consider alternative testing if suspicion for obstructive coronary artery disease is high.  CT attenuation images show significant aortic and coronary calcifications.  Echo 08/31/20:  1. Left ventricular ejection fraction, by estimation, is 50 to 55%. The left ventricle has low normal function. The left ventricle has no regional wall motion abnormalities. There is mild concentric left ventricular hypertrophy. Left ventricular diastolic parameters are consistent with Grade I diastolic dysfunction (impaired relaxation).  2. Right ventricular systolic function is normal. The right ventricular size is normal.  3. The mitral valve is normal in structure.  Trivial mitral valve regurgitation. No evidence of mitral stenosis.  4. The aortic valve is normal in structure. Aortic valve regurgitation is not visualized. No aortic stenosis is present.  5. The inferior vena cava is normal in size with greater than 50% respiratory variability, suggesting right atrial pressure of 3 mmHg.     Neuro/Psych neg Seizures PSYCHIATRIC DISORDERS Anxiety     Alcohol use disorder, 6-7 beers daily    GI/Hepatic Neg liver ROS,,,Cecal volvulus s/p colectomy 2022   Endo/Other  negative endocrine ROS    Renal/GU negative Renal ROS     Musculoskeletal  (+) Arthritis ,    Abdominal   Peds  Hematology negative hematology ROS (+)   Anesthesia Other Findings Patient is here with SBO and NG tube in place.  Plan for RSI with cricoid and NGT on suction to attempt to avoid aspiration.  Cardiology note 04/29/22:  1.  Peripheral arterial disease : Status post  bilateral common femoral artery endarterectomy with resolution of claudication.  He has known occlusive disease affecting the SFA and popliteal arteries but his claudication is currently not lifestyle limiting.  Recommend continuing medical therapy.  I requested a follow-up ABI and duplex to be done in January.   2.  Paroxysmal atrial fibrillation: He is maintaining in sinus rhythm.  CHA2DS2-VASc score is 2.  The patient asked if he needs to continue Xarelto  given that he only had 1 episode of atrial fibrillation in the setting of acute illness in 2022.  Given that he  had no recurrent episodes, I think it is reasonable to hold anticoagulation and resume if he develops recurrent episodes of atrial fibrillation.  Will stop Xarelto  today and add aspirin  81 mg daily given his peripheral arterial disease.   3.  Hyperlipidemia: Continue treatment with atorvastatin .   4.  Tobacco use: I discussed with him the importance of smoking cessation.  He cut down but has not been able to quit smoking completely.   5.  Essential  hypertension: Blood pressure is now well-controlled on metoprolol .  I elected to switch him to carvedilol  12.5 mg twice daily.   Disposition:   FU with me in 6 months.   Reproductive/Obstetrics                              Anesthesia Physical Anesthesia Plan  ASA: 3  Anesthesia Plan: General   Post-op Pain Management:    Induction: Intravenous, Rapid sequence and Cricoid pressure planned  PONV Risk Score and Plan: 1 and Ondansetron , Dexamethasone  and Treatment may vary due to age or medical condition  Airway Management Planned: Oral ETT  Additional Equipment:   Intra-op Plan:   Post-operative Plan: Extubation in OR  Informed Consent: I have reviewed the patients History and Physical, chart, labs and discussed the procedure including the risks, benefits and alternatives for the proposed anesthesia with the patient or authorized representative who has indicated his/her understanding and acceptance.     Dental advisory given  Plan Discussed with: CRNA  Anesthesia Plan Comments: (Patient consented for risks of anesthesia including but not limited to:  - adverse reactions to medications - damage to eyes, teeth, lips or other oral mucosa - nerve damage due to positioning  - sore throat or hoarseness - damage to heart, brain, nerves, lungs, other parts of body or loss of life  Informed patient about role of CRNA in peri- and intra-operative care.  Patient voiced understanding.)         Anesthesia Quick Evaluation  "

## 2024-06-10 NOTE — Plan of Care (Signed)
   Problem: Education: Goal: Knowledge of General Education information will improve Description Including pain rating scale, medication(s)/side effects and non-pharmacologic comfort measures Outcome: Progressing   Problem: Health Behavior/Discharge Planning: Goal: Ability to manage health-related needs will improve Outcome: Progressing

## 2024-06-10 NOTE — Transfer of Care (Signed)
 Immediate Anesthesia Transfer of Care Note  Patient: [place]  Procedure(s) Performed: LAPAROTOMY, EXPLORATORY WITH REMOVAL OF HERNIA SAC  Patient Location: PACU  Anesthesia Type:General  Level of Consciousness: drowsy  Airway & Oxygen Therapy: Patient Spontanous Breathing and Patient connected to face mask oxygen  Post-op Assessment: Report given to RN, Post -op Vital signs reviewed and stable, and Patient moving all extremities  Post vital signs: Reviewed and stable  Last Vitals:  Vitals Value Taken Time  BP 135/80 06/10/24 17:06  Temp 37.2 C 06/10/24 17:06  Pulse 72 06/10/24 17:10  Resp 15 06/10/24 17:10  SpO2 100 % 06/10/24 17:10  Vitals shown include unfiled device data.  Last Pain:  Vitals:   06/10/24 1706  TempSrc:   PainSc: Asleep         Complications: No notable events documented.

## 2024-06-10 NOTE — ED Notes (Signed)
 Pt dressed in gown for the OR at this time. Family at bedside.

## 2024-06-10 NOTE — Progress Notes (Signed)
 CC: SBO Subjective: Still getting around the clock narcotics IV  KUB pers reviewed w persistent SBO NGT over 1600cc still dark bilious this am  Objective: Vital signs in last 24 hours: Temp:  [98.2 F (36.8 C)-98.4 F (36.9 C)] 98.3 F (36.8 C) (02/01 0454) Pulse Rate:  [52-110] 81 (02/01 0800) Resp:  [17-26] 20 (02/01 0800) BP: (148-186)/(75-151) 183/88 (02/01 0800) SpO2:  [91 %-96 %] 93 % (02/01 0800)    Intake/Output from previous day: 01/31 0701 - 02/01 0700 In: 2050.2 [IV Piggyback:2050.2] Out: 1600 [Urine:400; Emesis/NG output:1200] Intake/Output this shift: Total I/O In: -  Out: 700 [Urine:700]  Physical exam: Chronically ill ABd: soft but more tender to palpation compared to yesterday, ventral hernia. No overt peritonitis but equivocal rebound  Lab Results: CBC  Recent Labs    06/09/24 0750 06/10/24 0414  WBC 13.3* 9.5  HGB 15.7 14.0  HCT 45.7 41.1  PLT 295 238   BMET Recent Labs    06/09/24 0750 06/10/24 0414  NA 125*  --   K 4.2  --   CL 87*  --   CO2 20*  --   GLUCOSE 136*  --   BUN 12  --   CREATININE 0.73 0.70  CALCIUM  9.9  --    PT/INR No results for input(s): LABPROT, INR in the last 72 hours. ABG No results for input(s): PHART, HCO3 in the last 72 hours.  Invalid input(s): PCO2, PO2  Studies/Results: DG ABD ACUTE 2+V W 1V CHEST Result Date: 06/10/2024 CLINICAL DATA:  [id number] SBO (small bowel obstruction) (HCC) [id number] EXAM: DG ABDOMEN ACUTE WITH 1 VIEW CHEST COMPARISON:  June 09, 2024 FINDINGS: The cardiomediastinal silhouette is unchanged in contour. No significant pleural effusion. No pneumothorax. LEFT basilar platelike opacity. Enteric tube side port projects over the GE junction. Diffuse gaseous dilation of loops of small bowel with air-fluid levels. Representative loop of bowel measures approximately 4.7 cm in diameter. Atherosclerotic calcifications. Degenerative changes of the lumbar spine. IMPRESSION: 1. Diffuse  gaseous dilation of loops of small bowel with air-fluid levels consistent with small bowel obstruction. 2. Enteric tube side port projects over the GE junction. Recommend advancement. 3. LEFT basilar platelike opacity, likely atelectasis. Electronically Signed   By: Corean Salter M.D.   On: 06/10/2024 08:44   DG Abdomen 1 View Result Date: 06/09/2024 EXAM: 1 VIEW XRAY OF THE ABDOMEN 06/09/2024 11:42:00 AM COMPARISON: None available. CLINICAL HISTORY: BG tube placement. FINDINGS: LINES, TUBES AND DEVICES: NG tube in place with tip in the stomach and side port below the GE junction. BOWEL: Dilated loops of bowel in the upper abdomen compatible with small bowel obstruction. SOFT TISSUES: No abnormal calcifications. BONES: No acute fracture. LUNGS: Left basilar atelectasis. IMPRESSION: 1. NG tube in place with tip in the stomach and side port below the GE junction. 2. Dilated loops of bowel in the upper abdomen compatible with small bowel obstruction. Electronically signed by: Waddell Calk MD 06/09/2024 11:56 AM EST RP Workstation: [id number]   CT ABDOMEN PELVIS W CONTRAST Result Date: 06/09/2024 EXAM: CT ABDOMEN AND PELVIS WITH CONTRAST 06/09/2024 09:41:00 AM TECHNIQUE: CT of the abdomen and pelvis was performed with the administration of 100 mL of iohexol  (OMNIPAQUE ) 300 MG/ML solution. Multiplanar reformatted images are provided for review. Automated exposure control, iterative reconstruction, and/or weight-based adjustment of the mA/kV was utilized to reduce the radiation dose to as low as reasonably achievable. COMPARISON: 09/05/2020 CLINICAL HISTORY: Mid/R sided abd pain, h/o resection. Mid/right-sided abdominal pain; history of resection.  FINDINGS: LOWER CHEST: Ground glass attenuation and pleural thickening with subpleural atelectasis noted in the left base. LIVER: No suspicious liver lesion. GALLBLADDER AND BILE DUCTS: Gallbladder is unremarkable. No biliary ductal dilatation. SPLEEN: No acute  abnormality. PANCREAS: No acute abnormality. ADRENAL GLANDS: No acute abnormality. KIDNEYS, URETERS AND BLADDER: Subcentimeter, Bosniak class 2 left kidney cysts. Per consensus, no follow-up is needed for simple Bosniak type 1 and 2 renal cysts, unless the patient has a malignancy history or risk factors. No stones in the kidneys or ureters. No hydronephrosis. No perinephric or periureteral stranding. Urinary bladder is unremarkable. GI AND BOWEL: There is diffuse distention of the fluid-filled stomach with air-fluid level. The proximal and mid small bowel loops are abnormally dilated, measuring up to 4.2 cm, with air-fluid levels. Status post right hemicolectomy with enterocolonic anastomosis at the level of the proximal transverse colon. The transition point is in the right hemiabdomen. There is mild venous congestion within the adjacent mesentery. Decompressed colon. The findings are consistent with small bowel obstruction, most likely adhesive small bowel obstruction. PERITONEUM AND RETROPERITONEUM: No free fluid or fluid collections. No signs of pneumoperitoneum. VASCULATURE: Aortic atherosclerotic calcification. Patent abdominal vascularity. Aorta is normal in caliber. LYMPH NODES: No enlarged abdominal or pelvic lymph nodes. REPRODUCTIVE ORGANS: Mild prostate gland enlargement containing dystrophic calcifications. BONES AND SOFT TISSUES: Lumbar degenerative disc disease. No acute or suspicious osseous findings. No focal soft tissue abnormality. IMPRESSION: 1. Small bowel obstruction with transition point in the right hemiabdomen, most likely due to postoperative adhesions. 2. Mild mesenteric congestion adjacent to the transition point, which may indicate early compromise; surgical evaluation is recommended, with repeat imaging as needed if symptoms worsen or fail to improve. Electronically signed by: Waddell Calk MD 06/09/2024 10:37 AM EST RP Workstation: [id number]    Anti-infectives: Anti-infectives  (From admission, onward)    None       Assessment/Plan: Worsening SBO, high NG output and more pain. D/W the pt in detail about his disease process due to fail to progress I do recommend laparotomy. Procedure d/w the pt in detail, Risks, benefits and possible complications including but not limited to bleeding, infection, bowel injuries. I personally spent a total of 40 minutes in the care of the patient today including performing a medically appropriate exam/evaluation, counseling and educating, placing orders, referring and communicating with other health care professionals, documenting clinical information in the EHR, independently interpreting and reviewing images studies and coordinating care.     Laneta Luna, MD, [facility]  06/10/2024

## 2024-06-11 ENCOUNTER — Encounter: Payer: Self-pay | Admitting: Surgery

## 2024-06-11 ENCOUNTER — Inpatient Hospital Stay

## 2024-06-11 DIAGNOSIS — K56609 Unspecified intestinal obstruction, unspecified as to partial versus complete obstruction: Secondary | ICD-10-CM | POA: Diagnosis not present

## 2024-06-11 LAB — CBC
HCT: 40 % (ref 39.0–52.0)
Hemoglobin: 13.2 g/dL (ref 13.0–17.0)
MCH: 33.1 pg (ref 26.0–34.0)
MCHC: 33 g/dL (ref 30.0–36.0)
MCV: 100.3 fL — ABNORMAL HIGH (ref 80.0–100.0)
Platelets: 202 10*3/uL (ref 150–400)
RBC: 3.99 MIL/uL — ABNORMAL LOW (ref 4.22–5.81)
RDW: 12.9 % (ref 11.5–15.5)
WBC: 1.7 10*3/uL — ABNORMAL LOW (ref 4.0–10.5)
nRBC: 0 % (ref 0.0–0.2)

## 2024-06-11 LAB — MAGNESIUM: Magnesium: 2.2 mg/dL (ref 1.7–2.4)

## 2024-06-11 LAB — COMPREHENSIVE METABOLIC PANEL WITH GFR
ALT: 12 U/L (ref 0–44)
AST: 20 U/L (ref 15–41)
Albumin: 3.5 g/dL (ref 3.5–5.0)
Alkaline Phosphatase: 53 U/L (ref 38–126)
Anion gap: 12 (ref 5–15)
BUN: 16 mg/dL (ref 8–23)
CO2: 20 mmol/L — ABNORMAL LOW (ref 22–32)
Calcium: 7.7 mg/dL — ABNORMAL LOW (ref 8.9–10.3)
Chloride: 101 mmol/L (ref 98–111)
Creatinine, Ser: 0.82 mg/dL (ref 0.61–1.24)
GFR, Estimated: >60 mL/min
Glucose, Bld: 115 mg/dL — ABNORMAL HIGH (ref 70–99)
Potassium: 3.7 mmol/L (ref 3.5–5.1)
Sodium: 133 mmol/L — ABNORMAL LOW (ref 135–145)
Total Bilirubin: 0.7 mg/dL (ref 0.0–1.2)
Total Protein: 5.3 g/dL — ABNORMAL LOW (ref 6.5–8.1)

## 2024-06-11 LAB — PHOSPHORUS: Phosphorus: 3.3 mg/dL (ref 2.5–4.6)

## 2024-06-11 LAB — HIV ANTIBODY (ROUTINE TESTING W REFLEX): HIV Screen 4th Generation wRfx: NONREACTIVE

## 2024-06-11 MED ORDER — PHENOL 1.4 % MT LIQD
1.0000 | OROMUCOSAL | Status: DC | PRN
  Administered 2024-06-11: 1 via OROMUCOSAL
  Filled 2024-06-11: qty 177

## 2024-06-11 NOTE — Progress Notes (Signed)
 Patient declined Hydralazine  10 mg IV for elevated B/P.

## 2024-06-11 NOTE — Progress Notes (Signed)
 Foley catheter removed per MD order. Orders followed. Patient tolerated well.

## 2024-06-12 ENCOUNTER — Inpatient Hospital Stay

## 2024-06-12 LAB — CBC
HCT: 38.5 % — ABNORMAL LOW (ref 39.0–52.0)
Hemoglobin: 12.8 g/dL — ABNORMAL LOW (ref 13.0–17.0)
MCH: 33.6 pg (ref 26.0–34.0)
MCHC: 33.2 g/dL (ref 30.0–36.0)
MCV: 101 fL — ABNORMAL HIGH (ref 80.0–100.0)
Platelets: 180 10*3/uL (ref 150–400)
RBC: 3.81 MIL/uL — ABNORMAL LOW (ref 4.22–5.81)
RDW: 12.9 % (ref 11.5–15.5)
WBC: 6.3 10*3/uL (ref 4.0–10.5)
nRBC: 0 % (ref 0.0–0.2)

## 2024-06-12 LAB — BASIC METABOLIC PANEL WITH GFR
Anion gap: 12 (ref 5–15)
BUN: 16 mg/dL (ref 8–23)
CO2: 20 mmol/L — ABNORMAL LOW (ref 22–32)
Calcium: 7.9 mg/dL — ABNORMAL LOW (ref 8.9–10.3)
Chloride: 102 mmol/L (ref 98–111)
Creatinine, Ser: 0.64 mg/dL (ref 0.61–1.24)
GFR, Estimated: >60 mL/min
Glucose, Bld: 79 mg/dL (ref 70–99)
Potassium: 3.5 mmol/L (ref 3.5–5.1)
Sodium: 134 mmol/L — ABNORMAL LOW (ref 135–145)

## 2024-06-12 LAB — MAGNESIUM: Magnesium: 2.4 mg/dL (ref 1.7–2.4)

## 2024-06-12 MED ORDER — ACETAMINOPHEN 10 MG/ML IV SOLN
1000.0000 mg | Freq: Four times a day (QID) | INTRAVENOUS | Status: DC
  Administered 2024-06-12 – 2024-06-13 (×2): 1000 mg via INTRAVENOUS
  Filled 2024-06-12 (×4): qty 100

## 2024-06-12 MED ORDER — ASPIRIN 81 MG PO TBEC
81.0000 mg | DELAYED_RELEASE_TABLET | Freq: Every day | ORAL | Status: DC
  Administered 2024-06-12 – 2024-06-13 (×2): 81 mg via ORAL
  Filled 2024-06-12 (×2): qty 1

## 2024-06-12 NOTE — Progress Notes (Signed)
 Dr. Marinda removed patients NG tube. Tolerating liquids well.

## 2024-06-12 NOTE — Plan of Care (Signed)
°  Problem: Education: °Goal: Knowledge of General Education information will improve °Description: Including pain rating scale, medication(s)/side effects and non-pharmacologic comfort measures °Outcome: Progressing °  °Problem: Health Behavior/Discharge Planning: °Goal: Ability to manage health-related needs will improve °Outcome: Progressing °  °Problem: Clinical Measurements: °Goal: Ability to maintain clinical measurements within normal limits will improve °Outcome: Progressing °  °Problem: Clinical Measurements: °Goal: Diagnostic test results will improve °Outcome: Progressing °  °Problem: Clinical Measurements: °Goal: Cardiovascular complication will be avoided °Outcome: Progressing °  °

## 2024-06-12 NOTE — Progress Notes (Incomplete)
 [place]

## 2024-06-12 NOTE — Progress Notes (Signed)
 CC: SBO, ventral hernia Subjective: Patient doing well, passing flatus Denies n/v, feels improved in terms of distention  Objective: Vital signs in last 24 hours: Temp:  [97.5 F (36.4 C)-97.8 F (36.6 C)] 97.8 F (36.6 C) (02/03 0808) Pulse Rate:  [57-76] 76 (02/03 0811) Resp:  [16-20] 16 (02/03 0400) BP: (146-185)/(69-96) 167/96 (02/03 1036) SpO2:  [95 %-97 %] 97 % (02/03 0811) Last BM Date : 06/08/24  Intake/Output from previous day: 02/02 0701 - 02/03 0700 In: 350 [P.O.:130; NG/GT:220] Out: 2600 [Urine:1400; Emesis/NG output:1200] Intake/Output this shift: Total I/O In: 60 [P.O.:60] Out: 300 [Urine:300]  Physical exam:  Abdomen is soft, minimal tenderness over incision, minor strikethrough on dressing, distention improving  Lab Results: CBC  Recent Labs    06/11/24 0448 06/12/24 0504  WBC 1.7* 6.3  HGB 13.2 12.8*  HCT 40.0 38.5*  PLT 202 180   BMET Recent Labs    06/11/24 0448 06/12/24 0504  NA 133* 134*  K 3.7 3.5  CL 101 102  CO2 20* 20*  GLUCOSE 115* 79  BUN 16 16  CREATININE 0.82 0.64  CALCIUM  7.7* 7.9*   PT/INR No results for input(s): LABPROT, INR in the last 72 hours. ABG No results for input(s): PHART, HCO3 in the last 72 hours.  Invalid input(s): PCO2, PO2  Studies/Results: DG ABD ACUTE 2+V W 1V CHEST Result Date: 06/12/2024 CLINICAL DATA:  Ileus EXAM: DG ABDOMEN ACUTE WITH 1 VIEW CHEST COMPARISON:  Yesterday FINDINGS: Distal tip of nasogastric tube is seen in expected position of proximal stomach. Midline surgical staples are noted. Dilated small bowel loops are noted which may represent postoperative ileus or obstruction. No definite colonic dilatation is noted. Minimal bibasilar subsegmental atelectasis is noted. IMPRESSION: Dilated small bowel loops are noted which may represent postoperative ileus or obstruction. Minimal bibasilar subsegmental atelectasis. Electronically Signed   By: Lynwood Landy Raddle M.D.   On: 06/12/2024  11:13   DG ABD ACUTE 2+V W 1V CHEST Result Date: 06/11/2024 CLINICAL DATA:  Ileus following gastrointestinal surgery. EXAM: DG ABDOMEN ACUTE WITH 1 VIEW CHEST COMPARISON:  06/10/2024 in CT abdomen pelvis 06/09/2024. FINDINGS: Frontal view of the chest shows midline trachea. Heart is at the upper limits of normal in size. Nasogastric tube terminates in the stomach. Lungs are low in volume with minimal left basilar atelectasis. May be trace left pleural fluid. Upright and supine views the abdomen show gaseous distention of small bowel. Some colonic gas is seen in the transgressed at some gas is seen in the transverse colon. There is gas in the rectum. Surgical skin staples in the midline. IMPRESSION: 1. Persistent small bowel obstruction. 2. Left basilar atelectasis. Electronically Signed   By: Newell Eke M.D.   On: 06/11/2024 08:32    Anti-infectives: Anti-infectives (From admission, onward)    Start     Dose/Rate Route Frequency Ordered Stop   06/10/24 2200  cefoTEtan  (CEFOTAN ) 2 g in sodium chloride  0.9 % 100 mL IVPB        2 g 200 mL/hr over 30 Minutes Intravenous Every 12 hours 06/10/24 1838 06/11/24 1010   06/10/24 0930  cefoTEtan  (CEFOTAN ) 2 g in sodium chloride  0.9 % 100 mL IVPB        2 g 200 mL/hr over 30 Minutes Intravenous  Once 06/10/24 0924 06/10/24 1620       Assessment/Plan:  Passing flatus, will remove NGT , okay for CLD, go slow with it.    Jayson Endow, M.D. [facility]

## 2024-06-13 ENCOUNTER — Inpatient Hospital Stay

## 2024-06-13 LAB — BASIC METABOLIC PANEL WITH GFR
Anion gap: 14 (ref 5–15)
BUN: 17 mg/dL (ref 8–23)
CO2: 17 mmol/L — ABNORMAL LOW (ref 22–32)
Calcium: 7.8 mg/dL — ABNORMAL LOW (ref 8.9–10.3)
Chloride: 100 mmol/L (ref 98–111)
Creatinine, Ser: 0.65 mg/dL (ref 0.61–1.24)
GFR, Estimated: >60 mL/min
Glucose, Bld: 79 mg/dL (ref 70–99)
Potassium: 3.5 mmol/L (ref 3.5–5.1)
Sodium: 132 mmol/L — ABNORMAL LOW (ref 135–145)

## 2024-06-13 LAB — SURGICAL PATHOLOGY

## 2024-06-13 MED ORDER — FUROSEMIDE 10 MG/ML IJ SOLN
20.0000 mg | Freq: Once | INTRAMUSCULAR | Status: AC
  Administered 2024-06-13: 20 mg via INTRAVENOUS
  Filled 2024-06-13: qty 4

## 2024-06-13 MED ORDER — OXYCODONE HCL 5 MG PO TABS: 5.0000 mg | ORAL_TABLET | Freq: Three times a day (TID) | ORAL | 0 refills | Status: AC | PRN

## 2024-06-13 NOTE — Plan of Care (Signed)

## 2024-06-13 NOTE — Progress Notes (Signed)
 Mobility Specialist Progress Note:    06/13/24 0926  Mobility  Activity Ambulated with assistance;Ambulated independently  Level of Assistance Modified independent, requires aide device or extra time  Assistive Device None  Distance Ambulated (ft) 180 ft  Range of Motion/Exercises Active;All extremities  Activity Response Tolerated well  Mobility visit 1 Mobility  Mobility Specialist Start Time (ACUTE ONLY) [id number]  Mobility Specialist Stop Time (ACUTE ONLY) 0936  Mobility Specialist Time Calculation (min) (ACUTE ONLY) 10 min   Pt received in bathroom, wife at bedside. Agreeable to mobility, ModI to stand and ambulate with no AD. Tolerated well, pt denies any pain during session. SpO2 95% on RA and HR 80 bpm after ambulation. Pt wanting to discharge before winter weather, RN made aware. Returned to room, all needs met.  Sherrilee Ditty Mobility Specialist Please contact via Special Educational Needs Teacher or  Rehab office at [phone]

## 2024-06-13 NOTE — Discharge Summary (Signed)
 " Physician Discharge Summary   Patient: Henry Hill MRN: [id number] DOB: 04-04-1961  Admit date:     06/09/2024  Discharge date: 06/13/24  Discharge Physician: Leita Blanch   PCP: Valora Lynwood FALCON, MD   Recommendations at discharge:   follow-up with Dr. Jordis on 06/25/2024  Discharge Diagnoses: Principal Problem:   SBO (small bowel obstruction) (HCC) Active Problems:   Essential (primary) hypertension   Atrial fibrillation with RVR (HCC)   Henry Hill is a pleasant 64 y.o. male with medical history significant for HTN, HLD, A-fib not on anticoagulation, alcohol use,, chronic hyponatremia, chronic smoking, cecal volvulus s/p resection in 2022 who came into ED complaining of abdominal pain nausea vomiting began yesterday afternoon.  Patient complained that his pain is 8/10 in intensity, intermittent, getting worse with nausea and vomiting.    CT abdomen and pelvis showed small bowel obstruction with transition point in the right hemiabdomen, most no family at bedside. Patient came in with increasing abdominal pain. NG tube placed after revealing SBO on CT scan. Good NG output. Abdominal x-rays does not show any improvement. Plan for OR this afternoon likely due to postoperative adhesions. Mild mesenteric congestion adjacent to the transition point, which may indicate early compromise; surgical evaluation is recommended.    General surgery was consulted who advised to put NG tube    Small bowel obstruction suspected due to mechanical adhesion - N.p.o., NG tube, IV fluid, pain medications, Protonix  - 2/1--General Surgery consult with Dr Festus to take pt to the OR today --2/2-- POD #1 s/p Laparotomy with  lysis of adhesions, reduction of internal hernia and primary repair large midline ventral hernia  --IVF, NPO, NG clamped, foley removed --2/3--Flatus + Abd xray shows ileus.  --2/4-- NG was removed yesterday. Patient tried on clear liquid advance to soft diet which she tolerated.  Patient is eager to go home. Dr. Jordis is okay with it. Will discharge patient to home    HTN - Blood pressure is stable - resume home meds - Hydralazine  as needed    Atrial fibrillation chronic - Does not take any anticoagulation. 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 Okay from surgery standpoint for discharge.   Procedures: Family communication :wife at bedside.  Consults :gen surgery CODE STATUS: full DVT Prophylaxis :lovenox      Pain control - [place]  Controlled Substance Reporting System database was reviewed. and patient was instructed, not to drive, operate heavy machinery, perform activities at heights, swimming or participation in water activities or provide baby-sitting services while on Pain, Sleep and Anxiety Medications; until their outpatient Physician has advised to do so again. Also recommended to not to take more than prescribed Pain, Sleep and Anxiety Medications.  Disposition: Home Diet recommendation:  Discharge Diet Orders (From admission, onward)     Start     Ordered   06/13/24 0000  Diet - low sodium heart healthy        06/13/24 1002           Regular diet DISCHARGE MEDICATION: Allergies as of 06/13/2024   No Known Allergies      Medication List     STOP taking these medications  MULTIVITAMIN ADULT PO       TAKE these medications    aspirin  EC 81 MG tablet Take 1 tablet (81 mg total) by mouth daily. Swallow whole.   losartan  100 MG tablet Commonly known as: COZAAR  Take 100 mg by mouth daily.   metoprolol  tartrate 50 MG tablet Commonly known as: LOPRESSOR  Take 75 mg by mouth 2 (two) times daily.    oxyCODONE  5 MG immediate release tablet Commonly known as: Oxy IR/ROXICODONE  Take 1 tablet (5 mg total) by mouth every 8 (eight) hours as needed for moderate pain (pain score 4-6).   tadalafil 20 MG tablet Commonly known as: CIALIS Take 20 mg by mouth daily as needed for erectile dysfunction.        Follow-up Information     Jordis Laneta FALCON, MD. Go on 06/25/2024.   Specialty: General Surgery Why: Go at 8:45am. Contact information: [address] [place] [address] [phone]         Valora Lynwood FALCON, MD. Go on 06/19/2024.   Specialty: Family Medicine Why: Go at 1:30pm. Contact information: [address] [place] [address] [phone]                Discharge Exam: Henry Hill   06/09/24 0749  Weight: 89.4 kg   Midline surgical scar with Staples present healing well. Respiratory clear to auscultation cardiovascular both heart sounds normal patient alert and oriented times three  Condition at discharge: fair  The results of significant diagnostics from this hospitalization (including imaging, microbiology, ancillary and laboratory) are listed below for reference.   Imaging Studies: DG ABD ACUTE 2+V W 1V CHEST Result Date: 06/13/2024 CLINICAL DATA:  Ileus. EXAM: DG ABDOMEN ACUTE WITH 1 VIEW CHEST COMPARISON:  Chest radiograph 08/31/2020 and CT abdomen 06/09/2024. FINDINGS: Trachea is midline. Heart size stable. Lungs are low in volume with minimal streaky opacification in the left lung base, as before. Dedicated views of the abdomen show gaseous distention of small bowel and colon with gas in the rectum. IMPRESSION: 1. Persistent ileus. 2. Low lung volumes with streaky left basilar atelectasis or scarring. Electronically Signed   By: Newell Eke M.D.   On: 06/13/2024 10:59   DG ABD ACUTE 2+V W 1V CHEST Result Date: 06/12/2024 CLINICAL DATA:  Ileus EXAM: DG ABDOMEN ACUTE WITH 1 VIEW CHEST COMPARISON:  Yesterday  FINDINGS: Distal tip of nasogastric tube is seen in expected position of proximal stomach. Midline surgical staples are noted. Dilated small bowel loops are noted which may represent postoperative ileus or obstruction. No definite colonic dilatation is noted. Minimal bibasilar subsegmental atelectasis is noted. IMPRESSION: Dilated small bowel loops are noted which may represent postoperative ileus or obstruction. Minimal bibasilar subsegmental atelectasis. Electronically Signed   By: Lynwood Landy Raddle M.D.   On: 06/12/2024 11:13   DG ABD ACUTE 2+V W 1V CHEST Result Date: 06/11/2024 CLINICAL DATA:  Ileus following gastrointestinal surgery. EXAM: DG ABDOMEN ACUTE WITH 1 VIEW CHEST COMPARISON:  06/10/2024 in CT abdomen pelvis 06/09/2024. FINDINGS: Frontal view of the chest shows midline trachea. Heart is at the upper limits of normal in size. Nasogastric tube terminates in the stomach. Lungs are low in volume with minimal left basilar atelectasis. May be trace left pleural fluid. Upright and supine views the abdomen show gaseous distention of small bowel. Some colonic gas is seen in the transgressed at some gas is seen in the transverse colon. There is gas in the rectum. Surgical skin staples  in the midline. IMPRESSION: 1. Persistent small bowel obstruction. 2. Left basilar atelectasis. Electronically Signed   By: Newell Eke M.D.   On: 06/11/2024 08:32   DG ABD ACUTE 2+V W 1V CHEST Result Date: 06/10/2024 CLINICAL DATA:  [id number] SBO (small bowel obstruction) (HCC) [id number] EXAM: DG ABDOMEN ACUTE WITH 1 VIEW CHEST COMPARISON:  June 09, 2024 FINDINGS: The cardiomediastinal silhouette is unchanged in contour. No significant pleural effusion. No pneumothorax. LEFT basilar platelike opacity. Enteric tube side port projects over the GE junction. Diffuse gaseous dilation of loops of small bowel with air-fluid levels. Representative loop of bowel measures approximately 4.7 cm in diameter. Atherosclerotic calcifications.  Degenerative changes of the lumbar spine. IMPRESSION: 1. Diffuse gaseous dilation of loops of small bowel with air-fluid levels consistent with small bowel obstruction. 2. Enteric tube side port projects over the GE junction. Recommend advancement. 3. LEFT basilar platelike opacity, likely atelectasis. Electronically Signed   By: Corean Salter M.D.   On: 06/10/2024 08:44   DG Abdomen 1 View Result Date: 06/09/2024 EXAM: 1 VIEW XRAY OF THE ABDOMEN 06/09/2024 11:42:00 AM COMPARISON: None available. CLINICAL HISTORY: BG tube placement. FINDINGS: LINES, TUBES AND DEVICES: NG tube in place with tip in the stomach and side port below the GE junction. BOWEL: Dilated loops of bowel in the upper abdomen compatible with small bowel obstruction. SOFT TISSUES: No abnormal calcifications. BONES: No acute fracture. LUNGS: Left basilar atelectasis. IMPRESSION: 1. NG tube in place with tip in the stomach and side port below the GE junction. 2. Dilated loops of bowel in the upper abdomen compatible with small bowel obstruction. Electronically signed by: Waddell Calk MD 06/09/2024 11:56 AM EST RP Workstation: [id number]   CT ABDOMEN PELVIS W CONTRAST Result Date: 06/09/2024 EXAM: CT ABDOMEN AND PELVIS WITH CONTRAST 06/09/2024 09:41:00 AM TECHNIQUE: CT of the abdomen and pelvis was performed with the administration of 100 mL of iohexol  (OMNIPAQUE ) 300 MG/ML solution. Multiplanar reformatted images are provided for review. Automated exposure control, iterative reconstruction, and/or weight-based adjustment of the mA/kV was utilized to reduce the radiation dose to as low as reasonably achievable. COMPARISON: 09/05/2020 CLINICAL HISTORY: Mid/R sided abd pain, h/o resection. Mid/right-sided abdominal pain; history of resection. FINDINGS: LOWER CHEST: Ground glass attenuation and pleural thickening with subpleural atelectasis noted in the left base. LIVER: No suspicious liver lesion. GALLBLADDER AND BILE DUCTS: Gallbladder is  unremarkable. No biliary ductal dilatation. SPLEEN: No acute abnormality. PANCREAS: No acute abnormality. ADRENAL GLANDS: No acute abnormality. KIDNEYS, URETERS AND BLADDER: Subcentimeter, Bosniak class 2 left kidney cysts. Per consensus, no follow-up is needed for simple Bosniak type 1 and 2 renal cysts, unless the patient has a malignancy history or risk factors. No stones in the kidneys or ureters. No hydronephrosis. No perinephric or periureteral stranding. Urinary bladder is unremarkable. GI AND BOWEL: There is diffuse distention of the fluid-filled stomach with air-fluid level. The proximal and mid small bowel loops are abnormally dilated, measuring up to 4.2 cm, with air-fluid levels. Status post right hemicolectomy with enterocolonic anastomosis at the level of the proximal transverse colon. The transition point is in the right hemiabdomen. There is mild venous congestion within the adjacent mesentery. Decompressed colon. The findings are consistent with small bowel obstruction, most likely adhesive small bowel obstruction. PERITONEUM AND RETROPERITONEUM: No free fluid or fluid collections. No signs of pneumoperitoneum. VASCULATURE: Aortic atherosclerotic calcification. Patent abdominal vascularity. Aorta is normal in caliber. LYMPH NODES: No enlarged abdominal or pelvic lymph nodes. REPRODUCTIVE ORGANS: Mild prostate gland enlargement containing  dystrophic calcifications. BONES AND SOFT TISSUES: Lumbar degenerative disc disease. No acute or suspicious osseous findings. No focal soft tissue abnormality. IMPRESSION: 1. Small bowel obstruction with transition point in the right hemiabdomen, most likely due to postoperative adhesions. 2. Mild mesenteric congestion adjacent to the transition point, which may indicate early compromise; surgical evaluation is recommended, with repeat imaging as needed if symptoms worsen or fail to improve. Electronically signed by: Waddell Calk MD 06/09/2024 10:37 AM EST RP  Workstation: [id number]    Microbiology: Results for orders placed or performed during the hospital encounter of 02/18/22  Surgical pcr screen     Status: None   Collection Time: 02/18/22 10:35 AM   Specimen: Nasal Mucosa; Nasal Swab  Result Value Ref Range Status   MRSA, PCR NEGATIVE NEGATIVE Final   Staphylococcus aureus NEGATIVE NEGATIVE Final    Comment: (NOTE) The Xpert SA Assay (FDA approved for NASAL specimens in patients 15 years of age and older), is one component of a comprehensive surveillance program. It is not intended to diagnose infection nor to guide or monitor treatment. Performed at [facility], [address] [place], [place] [address]     Labs: CBC: Recent Labs  Lab 06/09/24 0750 06/10/24 0414 06/11/24 0448 06/12/24 0504  WBC 13.3* 9.5 1.7* 6.3  HGB 15.7 14.0 13.2 12.8*  HCT 45.7 41.1 40.0 38.5*  MCV 97.2 98.6 100.3* 101.0*  PLT 295 238 202 180   Basic Metabolic Panel: Recent Labs  Lab 06/09/24 0750 06/10/24 0414 06/11/24 0448 06/12/24 0504 06/13/24 0634  NA 125*  --  133* 134* 132*  K 4.2  --  3.7 3.5 3.5  CL 87*  --  101 102 100  CO2 20*  --  20* 20* 17*  GLUCOSE 136*  --  115* 79 79  BUN 12  --  16 16 17   CREATININE 0.73 0.70 0.82 0.64 0.65  CALCIUM  9.9  --  7.7* 7.9* 7.8*  MG  --  2.1 2.2 2.4  --   PHOS  --   --  3.3  --   --    Liver Function Tests: Recent Labs  Lab 06/09/24 0750 06/11/24 0448  AST 31 20  ALT 27 12  ALKPHOS 93 53  BILITOT 0.8 0.7  PROT 7.4 5.3*  ALBUMIN  4.6 3.5    Discharge time spent: greater than 30 minutes.  Signed: Leita Blanch, MD [facility] 06/13/2024 "

## 2024-06-13 NOTE — Progress Notes (Signed)
" °   06/13/24 0600  Notify: Charge Nurse/RN  Name of Charge Nurse/RN Notified Administrator  Provider Notification  Provider Name/Title Fouts NP  Date Provider Notified 06/13/24  Time Provider Notified [phone]  Method of Notification Page  Notification Reason Red med refusal (Patient declined his IV tylenol  and his tordal this am. Rates pain 0/10)  Provider response Other (Comment) (These are not red meds, as night time cross cover I don't need notification. Just document the refusal.)  Date of Provider Response 06/13/24  Time of Provider Response [phone]    "

## 2024-06-13 NOTE — Progress Notes (Signed)
 CC: POD # 3 Subjective: Doing well , tolerating CLD, + flatus  Objective: Vital signs in last 24 hours: Temp:  [97.6 F (36.4 C)-98.1 F (36.7 C)] 97.7 F (36.5 C) (02/04 0446) Pulse Rate:  [76-99] 99 (02/04 0446) Resp:  [15-16] 16 (02/04 0446) BP: (139-174)/(83-101) 139/83 (02/04 0446) SpO2:  [95 %-97 %] 95 % (02/04 0446) Last BM Date : 06/08/24  Intake/Output from previous day: 02/03 0701 - 02/04 0700 In: 3076.2 [P.O.:660; IV Piggyback:2416.2] Out: 500 [Urine:500] Intake/Output this shift: No intake/output data recorded.  Physical exam:  NAD alert Abd: soft, dressing removed, no infection staplers in place  Lab Results: CBC  Recent Labs    06/11/24 0448 06/12/24 0504  WBC 1.7* 6.3  HGB 13.2 12.8*  HCT 40.0 38.5*  PLT 202 180   BMET Recent Labs    06/12/24 0504 06/13/24 0634  NA 134* 132*  K 3.5 3.5  CL 102 100  CO2 20* 17*  GLUCOSE 79 79  BUN 16 17  CREATININE 0.64 0.65  CALCIUM  7.9* 7.8*   PT/INR No results for input(s): LABPROT, INR in the last 72 hours. ABG No results for input(s): PHART, HCO3 in the last 72 hours.  Invalid input(s): PCO2, PO2  Studies/Results: DG ABD ACUTE 2+V W 1V CHEST Result Date: 06/12/2024 CLINICAL DATA:  Ileus EXAM: DG ABDOMEN ACUTE WITH 1 VIEW CHEST COMPARISON:  Yesterday FINDINGS: Distal tip of nasogastric tube is seen in expected position of proximal stomach. Midline surgical staples are noted. Dilated small bowel loops are noted which may represent postoperative ileus or obstruction. No definite colonic dilatation is noted. Minimal bibasilar subsegmental atelectasis is noted. IMPRESSION: Dilated small bowel loops are noted which may represent postoperative ileus or obstruction. Minimal bibasilar subsegmental atelectasis. Electronically Signed   By: Lynwood Landy Raddle M.D.   On: 06/12/2024 11:13    Anti-infectives: Anti-infectives (From admission, onward)    Start     Dose/Rate Route Frequency Ordered Stop    06/10/24 2200  cefoTEtan  (CEFOTAN ) 2 g in sodium chloride  0.9 % 100 mL IVPB        2 g 200 mL/hr over 30 Minutes Intravenous Every 12 hours 06/10/24 1838 06/11/24 1010   06/10/24 0930  cefoTEtan  (CEFOTAN ) 2 g in sodium chloride  0.9 % 100 mL IVPB        2 g 200 mL/hr over 30 Minutes Intravenous  Once 06/10/24 9075 06/10/24 1620       Assessment/Plan:  Doing well Mobilize May do one time lasix  low dose Mobilize DC tomorrow   [place], MD, [facility]  06/13/2024

## 2024-06-25 ENCOUNTER — Encounter: Admitting: Surgery
